# Patient Record
Sex: Female | Born: 1985 | Race: Black or African American | Hispanic: No | Marital: Married | Smoking: Former smoker
Health system: Southern US, Community
[De-identification: ages and names within clinical notes are randomized; demographics above are authoritative.]

## PROBLEM LIST (undated history)

## (undated) ENCOUNTER — Inpatient Hospital Stay (HOSPITAL_COMMUNITY): Payer: Self-pay

## (undated) DIAGNOSIS — I10 Essential (primary) hypertension: Principal | ICD-10-CM

## (undated) DIAGNOSIS — N926 Irregular menstruation, unspecified: Secondary | ICD-10-CM

## (undated) DIAGNOSIS — N051 Unspecified nephritic syndrome with focal and segmental glomerular lesions: Secondary | ICD-10-CM

## (undated) DIAGNOSIS — O24419 Gestational diabetes mellitus in pregnancy, unspecified control: Secondary | ICD-10-CM

## (undated) DIAGNOSIS — D649 Anemia, unspecified: Secondary | ICD-10-CM

## (undated) DIAGNOSIS — R809 Proteinuria, unspecified: Secondary | ICD-10-CM

## (undated) DIAGNOSIS — R569 Unspecified convulsions: Secondary | ICD-10-CM

## (undated) DIAGNOSIS — J40 Bronchitis, not specified as acute or chronic: Secondary | ICD-10-CM

## (undated) HISTORY — DX: Proteinuria, unspecified: R80.9

## (undated) HISTORY — DX: Unspecified convulsions: R56.9

## (undated) HISTORY — DX: Unspecified nephritic syndrome with focal and segmental glomerular lesions: N05.1

## (undated) HISTORY — DX: Essential (primary) hypertension: I10

## (undated) HISTORY — PX: NO PAST SURGERIES: SHX2092

## (undated) HISTORY — DX: Irregular menstruation, unspecified: N92.6

---
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
## 2003-11-18 ENCOUNTER — Inpatient Hospital Stay (HOSPITAL_COMMUNITY): Admission: AD | Admit: 2003-11-18 | Discharge: 2003-11-18 | Payer: Self-pay | Admitting: Obstetrics and Gynecology

## 2003-11-23 ENCOUNTER — Inpatient Hospital Stay (HOSPITAL_COMMUNITY): Admission: AD | Admit: 2003-11-23 | Discharge: 2003-11-23 | Payer: Self-pay | Admitting: Obstetrics and Gynecology

## 2005-01-08 ENCOUNTER — Emergency Department (HOSPITAL_COMMUNITY): Admission: EM | Admit: 2005-01-08 | Discharge: 2005-01-08 | Payer: Self-pay | Admitting: Family Medicine

## 2006-02-28 ENCOUNTER — Emergency Department (HOSPITAL_COMMUNITY): Admission: EM | Admit: 2006-02-28 | Discharge: 2006-02-28 | Payer: Self-pay | Admitting: Emergency Medicine

## 2006-03-01 ENCOUNTER — Emergency Department (HOSPITAL_COMMUNITY): Admission: EM | Admit: 2006-03-01 | Discharge: 2006-03-01 | Payer: Self-pay | Admitting: Emergency Medicine

## 2006-05-23 ENCOUNTER — Emergency Department (HOSPITAL_COMMUNITY): Admission: EM | Admit: 2006-05-23 | Discharge: 2006-05-23 | Payer: Self-pay | Admitting: Emergency Medicine

## 2006-11-05 ENCOUNTER — Emergency Department (HOSPITAL_COMMUNITY): Admission: EM | Admit: 2006-11-05 | Discharge: 2006-11-05 | Payer: Self-pay | Admitting: Emergency Medicine

## 2007-07-09 ENCOUNTER — Emergency Department (HOSPITAL_COMMUNITY): Admission: EM | Admit: 2007-07-09 | Discharge: 2007-07-09 | Payer: Self-pay | Admitting: Emergency Medicine

## 2007-07-12 ENCOUNTER — Emergency Department (HOSPITAL_COMMUNITY): Admission: EM | Admit: 2007-07-12 | Discharge: 2007-07-12 | Payer: Self-pay | Admitting: Emergency Medicine

## 2008-01-07 ENCOUNTER — Emergency Department (HOSPITAL_COMMUNITY): Admission: EM | Admit: 2008-01-07 | Discharge: 2008-01-07 | Payer: Self-pay | Admitting: Emergency Medicine

## 2008-09-21 ENCOUNTER — Emergency Department (HOSPITAL_COMMUNITY): Admission: EM | Admit: 2008-09-21 | Discharge: 2008-09-21 | Payer: Self-pay | Admitting: Emergency Medicine

## 2008-12-18 ENCOUNTER — Emergency Department (HOSPITAL_COMMUNITY): Admission: EM | Admit: 2008-12-18 | Discharge: 2008-12-18 | Payer: Self-pay | Admitting: Emergency Medicine

## 2009-05-16 ENCOUNTER — Emergency Department (HOSPITAL_COMMUNITY): Admission: EM | Admit: 2009-05-16 | Discharge: 2009-05-16 | Payer: Self-pay | Admitting: Emergency Medicine

## 2009-09-21 ENCOUNTER — Emergency Department (HOSPITAL_COMMUNITY): Admission: EM | Admit: 2009-09-21 | Discharge: 2009-09-21 | Payer: Self-pay | Admitting: Emergency Medicine

## 2010-01-21 ENCOUNTER — Emergency Department (HOSPITAL_COMMUNITY)
Admission: EM | Admit: 2010-01-21 | Discharge: 2010-01-21 | Payer: Self-pay | Source: Home / Self Care | Admitting: Family Medicine

## 2010-03-29 LAB — URINALYSIS, ROUTINE W REFLEX MICROSCOPIC
Bilirubin Urine: NEGATIVE
Ketones, ur: NEGATIVE mg/dL
Nitrite: NEGATIVE
Protein, ur: 100 mg/dL — AB
Urobilinogen, UA: 0.2 mg/dL (ref 0.0–1.0)

## 2010-03-29 LAB — POCT PREGNANCY, URINE: Preg Test, Ur: NEGATIVE

## 2010-03-29 LAB — URINE MICROSCOPIC-ADD ON

## 2010-04-12 LAB — CBC
HCT: 40.2 % (ref 36.0–46.0)
Hemoglobin: 13.7 g/dL (ref 12.0–15.0)
Platelets: 313 10*3/uL (ref 150–400)
RBC: 4.59 MIL/uL (ref 3.87–5.11)
WBC: 13.3 10*3/uL — ABNORMAL HIGH (ref 4.0–10.5)

## 2010-04-12 LAB — DIFFERENTIAL
Eosinophils Relative: 2 % (ref 0–5)
Lymphocytes Relative: 13 % (ref 12–46)
Lymphs Abs: 1.7 10*3/uL (ref 0.7–4.0)

## 2010-04-15 LAB — URINE MICROSCOPIC-ADD ON

## 2010-04-15 LAB — URINALYSIS, ROUTINE W REFLEX MICROSCOPIC
Glucose, UA: NEGATIVE mg/dL
Ketones, ur: NEGATIVE mg/dL
pH: 5 (ref 5.0–8.0)

## 2010-05-13 ENCOUNTER — Inpatient Hospital Stay (INDEPENDENT_AMBULATORY_CARE_PROVIDER_SITE_OTHER)
Admission: RE | Admit: 2010-05-13 | Discharge: 2010-05-13 | Disposition: A | Discharge status: Home / Self Care | Payer: Self-pay | Source: Ambulatory Visit | Attending: Family Medicine | Admitting: Family Medicine

## 2010-05-13 DIAGNOSIS — R112 Nausea with vomiting, unspecified: Secondary | ICD-10-CM

## 2010-05-13 DIAGNOSIS — R197 Diarrhea, unspecified: Secondary | ICD-10-CM

## 2010-05-13 LAB — POCT URINALYSIS DIP (DEVICE)
Bilirubin Urine: NEGATIVE
Nitrite: NEGATIVE
Protein, ur: 100 mg/dL — AB
Urobilinogen, UA: 0.2 mg/dL (ref 0.0–1.0)
pH: 7 (ref 5.0–8.0)

## 2010-10-14 LAB — POCT I-STAT, CHEM 8
BUN: 10 mg/dL (ref 6–23)
Calcium, Ion: 0.98 mmol/L — ABNORMAL LOW (ref 1.12–1.32)
Chloride: 105 meq/L (ref 96–112)
Creatinine, Ser: 1.2 mg/dL (ref 0.4–1.2)
Glucose, Bld: 106 mg/dL — ABNORMAL HIGH (ref 70–99)
HCT: 39 % (ref 36.0–46.0)
Hemoglobin: 13.3 g/dL (ref 12.0–15.0)
Potassium: 3.5 meq/L (ref 3.5–5.1)
Sodium: 137 meq/L (ref 135–145)
TCO2: 23 mmol/L (ref 0–100)

## 2010-10-14 LAB — URINALYSIS, ROUTINE W REFLEX MICROSCOPIC
Bilirubin Urine: NEGATIVE
Glucose, UA: NEGATIVE mg/dL
Hgb urine dipstick: NEGATIVE
Ketones, ur: 15 mg/dL — AB
Leukocytes, UA: NEGATIVE
Nitrite: NEGATIVE
Protein, ur: >300 mg/dL — AB
Specific Gravity, Urine: 1.034 — ABNORMAL HIGH (ref 1.005–1.030)
Urobilinogen, UA: 1 mg/dL (ref 0.0–1.0)
pH: 6 (ref 5.0–8.0)

## 2010-10-14 LAB — CBC
HCT: 38.8 % (ref 36.0–46.0)
Hemoglobin: 13.1 g/dL (ref 12.0–15.0)
MCHC: 33.7 g/dL (ref 30.0–36.0)
MCV: 90.8 fL (ref 78.0–100.0)
Platelets: 327 K/uL (ref 150–400)
RBC: 4.27 MIL/uL (ref 3.87–5.11)
RDW: 13.3 % (ref 11.5–15.5)
WBC: 9.9 K/uL (ref 4.0–10.5)

## 2010-10-14 LAB — URINE MICROSCOPIC-ADD ON

## 2010-10-14 LAB — POCT PREGNANCY, URINE: Preg Test, Ur: NEGATIVE

## 2010-10-19 LAB — URINALYSIS, ROUTINE W REFLEX MICROSCOPIC
Leukocytes, UA: NEGATIVE
Nitrite: NEGATIVE
Specific Gravity, Urine: 1.027
pH: 5.5

## 2010-10-19 LAB — URINE MICROSCOPIC-ADD ON

## 2010-10-19 LAB — PREGNANCY, URINE: Preg Test, Ur: NEGATIVE

## 2010-11-03 ENCOUNTER — Emergency Department (HOSPITAL_COMMUNITY)
Admission: EM | Admit: 2010-11-03 | Discharge: 2010-11-03 | Disposition: A | Discharge status: Home / Self Care | Payer: Self-pay | Attending: Emergency Medicine | Admitting: Emergency Medicine

## 2010-11-03 DIAGNOSIS — R110 Nausea: Secondary | ICD-10-CM | POA: Insufficient documentation

## 2010-11-03 DIAGNOSIS — H53149 Visual discomfort, unspecified: Secondary | ICD-10-CM | POA: Insufficient documentation

## 2010-11-03 DIAGNOSIS — R51 Headache: Secondary | ICD-10-CM | POA: Insufficient documentation

## 2010-11-03 LAB — POCT PREGNANCY, URINE: Preg Test, Ur: NEGATIVE

## 2010-12-15 IMAGING — CR DG CHEST 2V
2 series · 2 of 2 positions shown · non-contrast
Comparison: 07/09/2007

CLINICAL DATA: Chest pain.

CHEST - 2 VIEW

[w chest pa]
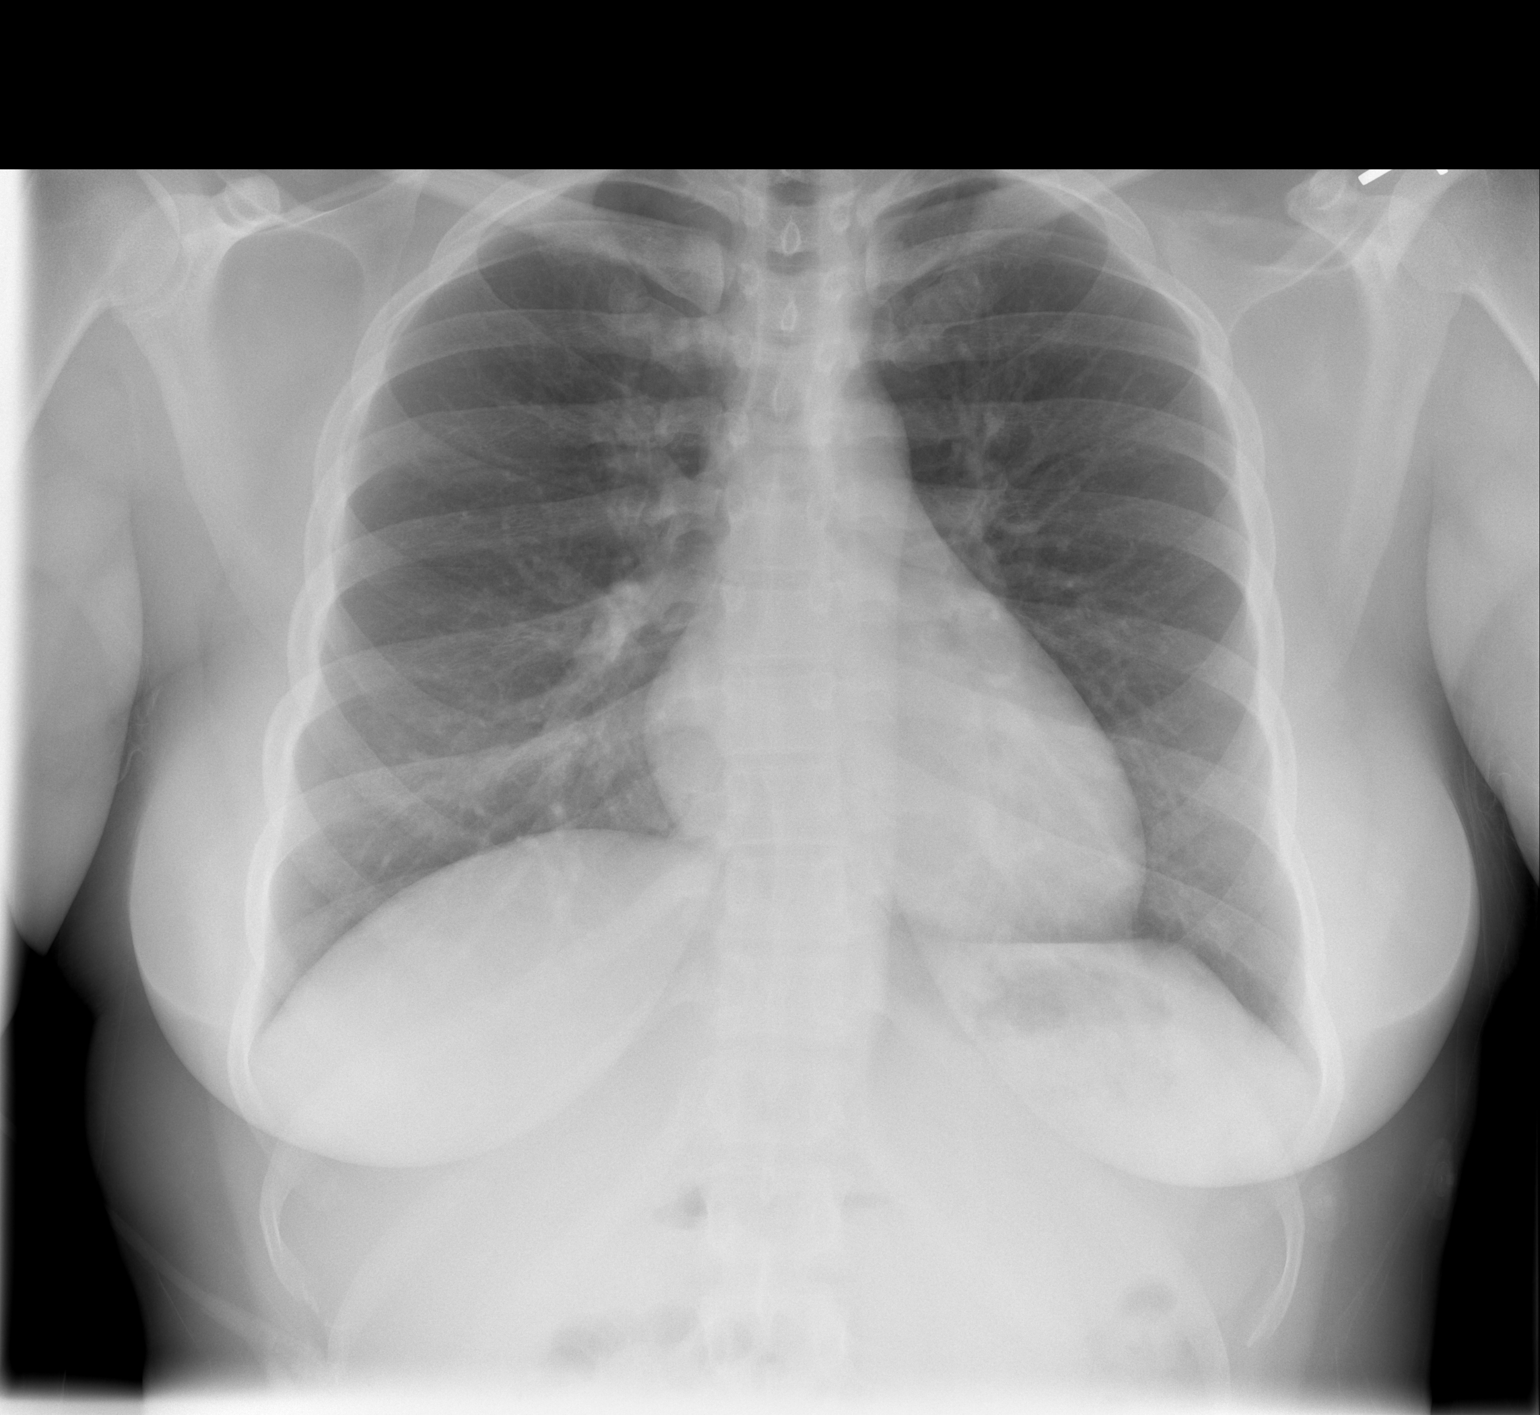

[w chest lat]
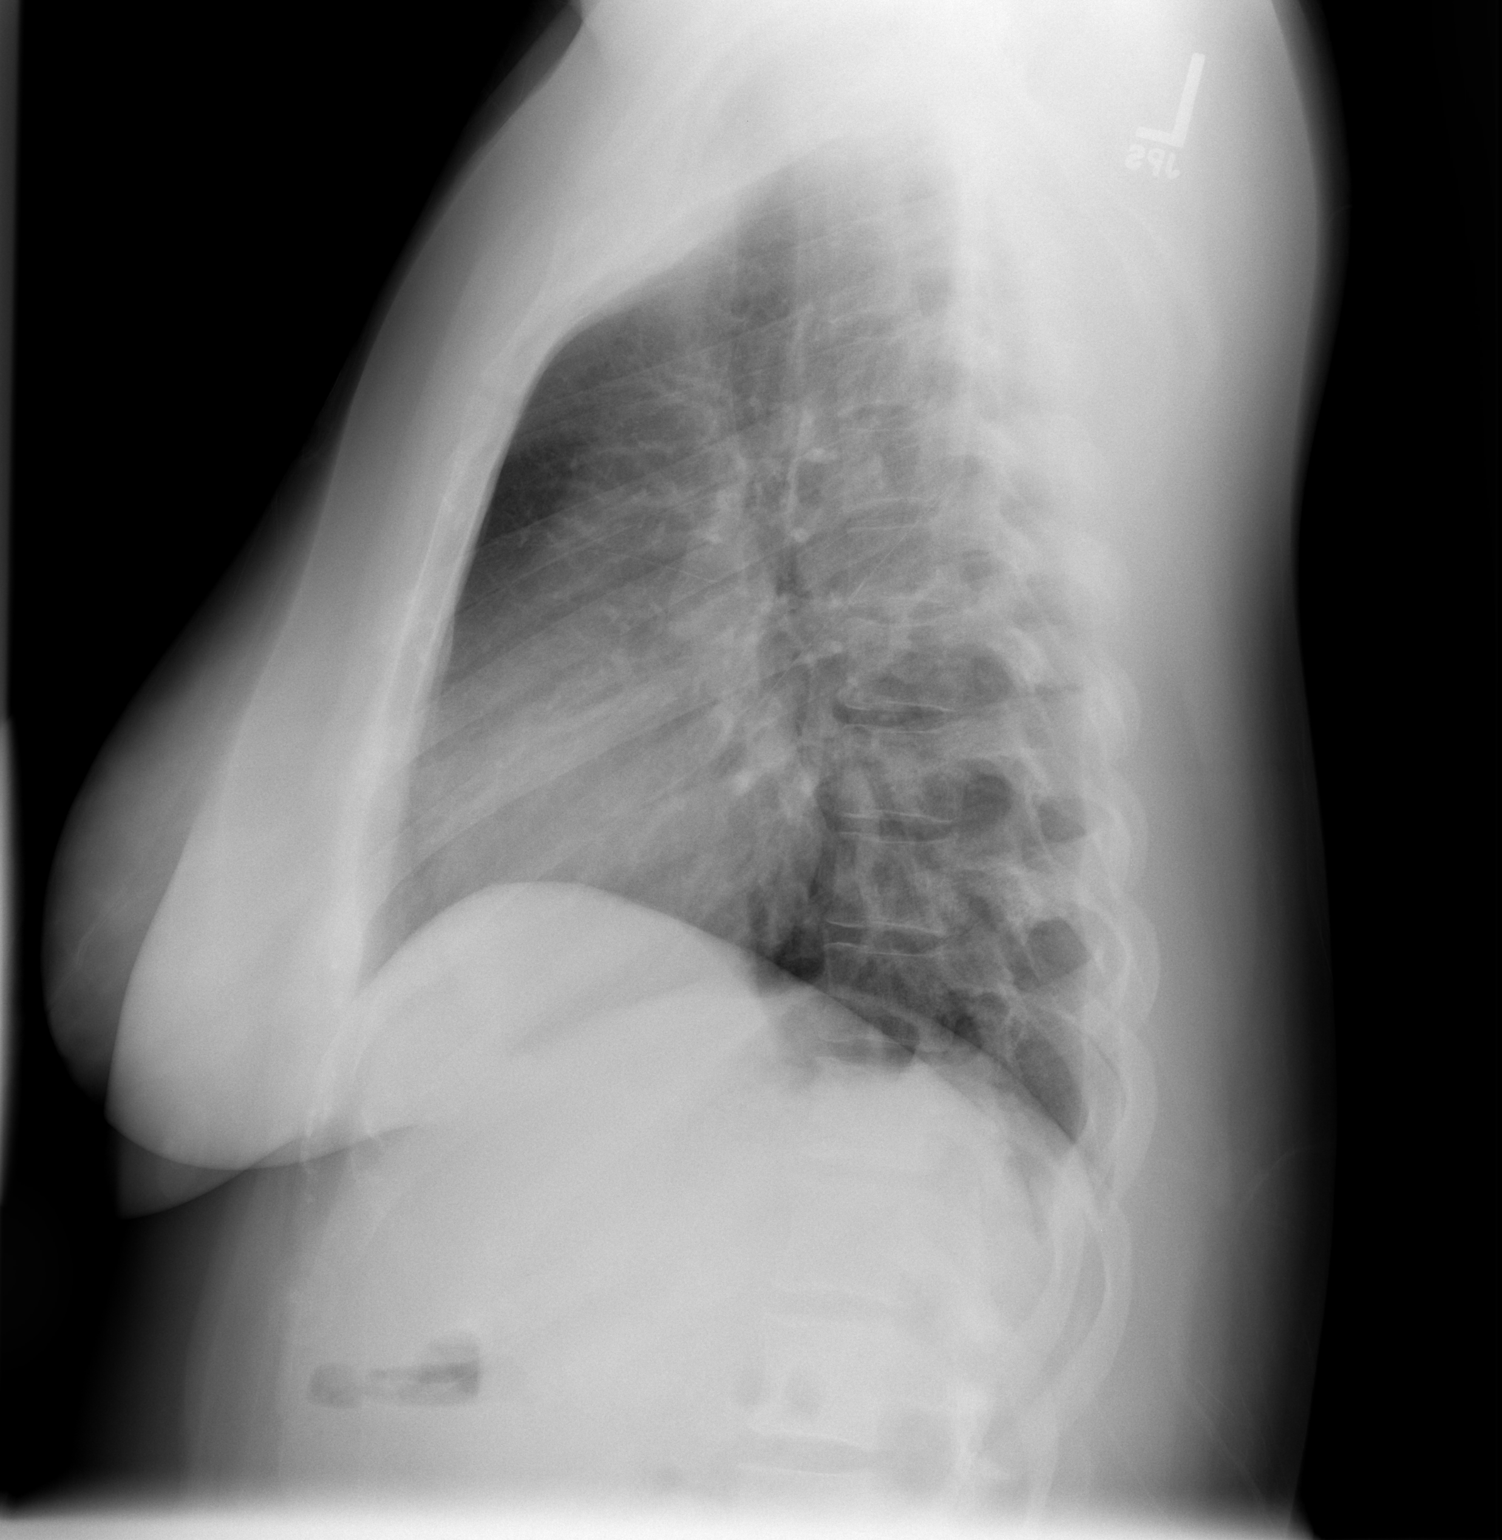

[2 of 2 positions shown; findings below may reference images not displayed]

FINDINGS: The cardiomediastinal silhouette is unremarkable.
Mild peribronchial thickening is unchanged.
The lungs are otherwise clear.
There is no evidence of focal airspace disease, pleural effusions,
or pneumothorax.
No acute bony abnormalities identified.
IMPRESSION: No evidence of acute cardiopulmonary disease.

Chronic mild peribronchial thickening.

## 2010-12-29 ENCOUNTER — Encounter: Payer: Self-pay | Admitting: *Deleted

## 2010-12-29 ENCOUNTER — Emergency Department (HOSPITAL_COMMUNITY): Payer: Self-pay

## 2010-12-29 ENCOUNTER — Emergency Department (HOSPITAL_COMMUNITY)
Admission: EM | Admit: 2010-12-29 | Discharge: 2010-12-29 | Disposition: A | Discharge status: Home / Self Care | Payer: Self-pay | Attending: Emergency Medicine | Admitting: Emergency Medicine

## 2010-12-29 DIAGNOSIS — R05 Cough: Secondary | ICD-10-CM | POA: Insufficient documentation

## 2010-12-29 DIAGNOSIS — R059 Cough, unspecified: Secondary | ICD-10-CM | POA: Insufficient documentation

## 2010-12-29 DIAGNOSIS — R0602 Shortness of breath: Secondary | ICD-10-CM | POA: Insufficient documentation

## 2010-12-29 DIAGNOSIS — R062 Wheezing: Secondary | ICD-10-CM | POA: Insufficient documentation

## 2010-12-29 DIAGNOSIS — J3489 Other specified disorders of nose and nasal sinuses: Secondary | ICD-10-CM | POA: Insufficient documentation

## 2010-12-29 DIAGNOSIS — J40 Bronchitis, not specified as acute or chronic: Secondary | ICD-10-CM | POA: Insufficient documentation

## 2010-12-29 HISTORY — DX: Bronchitis, not specified as acute or chronic: J40

## 2010-12-29 MED ORDER — PREDNISONE 20 MG PO TABS: 40.0000 mg | ORAL_TABLET | Freq: Every day | ORAL | Status: DC

## 2010-12-29 MED ORDER — HYDROCODONE-HOMATROPINE 5-1.5 MG/5ML PO SYRP: 5.0000 mL | ORAL_SOLUTION | Freq: Four times a day (QID) | ORAL | Status: AC | PRN

## 2010-12-29 NOTE — ED Provider Notes (Signed)
Medical screening examination/treatment/procedure(s) were performed by non-physician practitioner and as supervising physician I was immediately available for consultation/collaboration.    Mazy Culton L Afsana Liera, MD 12/29/10 2219 

## 2010-12-29 NOTE — ED Provider Notes (Signed)
History     CSN: [id number]  Arrival date & time 12/29/10  1213   First MD Initiated Contact with Patient 12/29/10 1424      Chief Complaint  Patient presents with  . Cough    pt c/o chest congestion, and cough that began on sunday.    (Consider location/radiation/quality/duration/timing/severity/associated sxs/prior treatment) Patient is a 25 y.o. female presenting with cough. The history is provided by the patient.  Cough This is a new problem. The current episode started more than 2 days ago. The problem occurs constantly. The problem has been gradually worsening. The cough is productive of sputum. There has been no fever. Associated symptoms include rhinorrhea, sore throat, shortness of breath and wheezing. Pertinent negatives include no chest pain, no chills, no sweats, no ear congestion and no ear pain. She has tried cough syrup and decongestants for the symptoms. She is a smoker.  Pt states it started with cold symptoms 3 days ago. States all of them improved except for cough which is worsening. States she is now wheezing, green productive sputum, short of breath.   Past Medical History  Diagnosis Date  . Bronchitis     History reviewed. No pertinent past surgical history.  History reviewed. No pertinent family history.  History  Substance Use Topics  . Smoking status: Never Smoker   . Smokeless tobacco: Not on file  . Alcohol Use: No    OB History    Grav Para Term Preterm Abortions TAB SAB Ect Mult Living                  Review of Systems  Constitutional: Negative for fever and chills.  HENT: Positive for sore throat and rhinorrhea. Negative for ear pain and neck pain.   Eyes: Negative.   Respiratory: Positive for cough, shortness of breath and wheezing.   Cardiovascular: Negative for chest pain.  Gastrointestinal: Negative.   Genitourinary: Negative.   Musculoskeletal: Negative.   Skin: Negative.  Negative for rash.  Neurological: Negative.     Psychiatric/Behavioral: Negative.     Allergies  Ibuprofen  Home Medications   Current Outpatient Rx  Name Route Sig Dispense Refill  . DM-GUAIFENESIN ER 30-600 MG PO TB12 Oral Take 1 tablet by mouth every 12 (twelve) hours.      [name] PSEUDOEPHEDRINE-ACETAMINOPHEN 30-500 MG PO TABS Oral Take 1 tablet by mouth every 4 (four) hours as needed. HEAD COLD       BP 137/95  Pulse 80  Temp(Src) 98.7 F (37.1 C) (Oral)  Resp 18  SpO2 97%  LMP 06/20/2010  Physical Exam  Nursing note and vitals reviewed. Constitutional: She is oriented to person, place, and time. She appears well-developed and well-nourished.  HENT:  Head: Normocephalic.  Right Ear: External ear normal.  Left Ear: External ear normal.  Mouth/Throat: Oropharynx is clear and moist.       Clear nasal rhinorrhea  Eyes: Conjunctivae are normal. Pupils are equal, round, and reactive to light.  Neck: Neck supple.  Cardiovascular: Normal rate, regular rhythm and normal heart sounds.   Pulmonary/Chest: Effort normal and breath sounds normal. No respiratory distress. She has no wheezes. She has no rales.  Musculoskeletal: Normal range of motion.  Neurological: She is alert and oriented to person, place, and time.  Skin: Skin is warm and dry.  Psychiatric: She has a normal mood and affect.    ED Course  Procedures (including critical care time)  Dg Chest 2 View  12/29/2010  *RADIOLOGY REPORT*  Clinical Data: Shortness of breath, cough, chest pain, smoking history  CHEST - 2 VIEW  Comparison: The chest x-ray of 12/18/2008  Findings: The lungs are clear.  Again there are somewhat prominent perihilar markings present.  Mediastinal contours appear stable. The heart is within normal limits in size.  No bony abnormality is seen.  IMPRESSION: No active lung disease.  Original Report Authenticated By: Juline Patch, M.D.   Pt with cough for 3 days. Negative cxr. Pt afebrile, no other complaints. Pt is otherwise healthy. Suspect  viral bronchitis.  Will d/c home with close follow up. Will treat with prednisone, pt already has albuterol, and cough medications.  MDM          Lottie Mussel, PA 12/29/10 [phone]

## 2012-08-25 ENCOUNTER — Emergency Department (HOSPITAL_COMMUNITY)
Admission: EM | Admit: 2012-08-25 | Discharge: 2012-08-25 | Disposition: A | Discharge status: Home / Self Care | Payer: Self-pay | Attending: Emergency Medicine | Admitting: Emergency Medicine

## 2012-08-25 ENCOUNTER — Encounter (HOSPITAL_COMMUNITY): Payer: Self-pay | Admitting: Emergency Medicine

## 2012-08-25 DIAGNOSIS — M62838 Other muscle spasm: Secondary | ICD-10-CM | POA: Insufficient documentation

## 2012-08-25 MED ORDER — METHOCARBAMOL 500 MG PO TABS: 500.0000 mg | ORAL_TABLET | Freq: Two times a day (BID) | ORAL | Status: DC | PRN

## 2012-08-25 MED ORDER — TRAMADOL HCL 50 MG PO TABS: 50.0000 mg | ORAL_TABLET | Freq: Four times a day (QID) | ORAL | Status: DC | PRN

## 2012-08-25 NOTE — ED Provider Notes (Signed)
CSN: [id number]     Arrival date & time 08/25/12  1101 History     First MD Initiated Contact with Patient 08/25/12 1132     Chief Complaint  Patient presents with  . Neck Pain   (Consider location/radiation/quality/duration/timing/severity/associated sxs/prior Treatment) The history is provided by the patient and medical records.   Pt presents to the ED for left sided neck pain radiating into her left shoulder x 1 week.  Pt states she had a migraine a few days ago-- hx of migraines, states it was typical headache.  Migraines are usually associated with neck muscle tension but usually resolve with the headache.  Pt states she can move her neck, but it is very painful and "tight" feeling.  Denies any numbness or paresthesias of UE. Denies any heavy lifting or new exercise to cause muscle strain. No headaches at present.  No fevers, sweats, or chills.  No visual disturbance, tinnitus, difficulty concentrating, or AMS.  Has taken Tylenol PTA without significant improvement.  Past Medical History  Diagnosis Date  . Bronchitis    History reviewed. No pertinent past surgical history. History reviewed. No pertinent family history. History  Substance Use Topics  . Smoking status: Never Smoker   . Smokeless tobacco: Not on file  . Alcohol Use: No   OB History   Grav Para Term Preterm Abortions TAB SAB Ect Mult Living                 Review of Systems  HENT: Positive for neck pain.   Musculoskeletal: Positive for myalgias.  All other systems reviewed and are negative.    Allergies  Ibuprofen  Home Medications   Current Outpatient Rx  Name  Route  Sig  Dispense  Refill  . acetaminophen (TYLENOL) 500 MG tablet   Oral   Take 1,000 mg by mouth every 6 (six) hours as needed for pain.          BP 193/123  Pulse 82  Temp(Src) 98.5 F (36.9 C) (Oral)  Resp 18  Ht 5\' 5"  (1.651 m)  Wt 190 lb (86.183 kg)  BMI 31.62 kg/m2  SpO2 99%  LMP 02/26/2012  Physical Exam  Nursing  note and vitals reviewed. Constitutional: She is oriented to person, place, and time. She appears well-developed and well-nourished.  HENT:  Head: Normocephalic and atraumatic.  Eyes: Conjunctivae and EOM are normal.  Neck: Normal range of motion and full passive range of motion without pain. Neck supple. Muscular tenderness present. No spinous process tenderness present. No rigidity. No erythema and normal range of motion present.    TTP of left trapezius, no nuchal rigidity, full ROM maintained including chin-to-chest, strong radial pulse and cap refill, sensation intact  Cardiovascular: Normal rate, regular rhythm and normal heart sounds.   Pulmonary/Chest: Effort normal and breath sounds normal.  Musculoskeletal: Normal range of motion. She exhibits no edema.  Neurological: She is alert and oriented to person, place, and time. She has normal strength. She displays no tremor. No sensory deficit. She displays no seizure activity. Gait normal.  Skin: Skin is warm and dry.  Psychiatric: She has a normal mood and affect.    ED Course   Procedures (including critical care time)  Labs Reviewed - No data to display No results found. 1. Neck muscle spasm     MDM   Muscles spasms tracking along left side of trapezius.  No nuchal rigidity, fevers, or current headache to suggest meningitis.  Rx robaxin and tramadol.  Discussed supportive care home, including heat therapy. Pt hypertensive in the ED without hx of HTN-- encouraged close followup with the [facility] for re-check of BP and close FU if symptoms not improving in the next few days.    Discussed plan with patient, she agreed. Return precautions advised.  Garlon Hatchet, PA-C 08/25/12 1242

## 2012-08-25 NOTE — ED Notes (Signed)
Pt alert, arrives from home, c/o left shoulder and neck pain, onset was a week ago, denies trauma or injury, resp even unlabored, skin pwd, pt states pain is worse with movement

## 2012-08-27 NOTE — ED Provider Notes (Signed)
Medical screening examination/treatment/procedure(s) were performed by non-physician practitioner and as supervising physician I was immediately available for consultation/collaboration.   Makinzey Banes W. Allegra Cerniglia, MD 08/27/12 0853 

## 2012-08-28 ENCOUNTER — Emergency Department (HOSPITAL_COMMUNITY)
Admission: EM | Admit: 2012-08-28 | Discharge: 2012-08-28 | Disposition: A | Discharge status: Home / Self Care | Payer: Self-pay | Attending: Emergency Medicine | Admitting: Emergency Medicine

## 2012-08-28 ENCOUNTER — Encounter (HOSPITAL_COMMUNITY): Payer: Self-pay | Admitting: Emergency Medicine

## 2012-08-28 DIAGNOSIS — I10 Essential (primary) hypertension: Secondary | ICD-10-CM | POA: Insufficient documentation

## 2012-08-28 DIAGNOSIS — G243 Spasmodic torticollis: Secondary | ICD-10-CM | POA: Insufficient documentation

## 2012-08-28 DIAGNOSIS — Z8709 Personal history of other diseases of the respiratory system: Secondary | ICD-10-CM | POA: Insufficient documentation

## 2012-08-28 DIAGNOSIS — F17200 Nicotine dependence, unspecified, uncomplicated: Secondary | ICD-10-CM | POA: Insufficient documentation

## 2012-08-28 MED ORDER — HYDROMORPHONE HCL PF 1 MG/ML IJ SOLN
1.0000 mg | Freq: Once | INTRAMUSCULAR | Status: AC
  Administered 2012-08-28: 1 mg via INTRAVENOUS
  Filled 2012-08-28: qty 1

## 2012-08-28 MED ORDER — HYDROMORPHONE HCL PF 1 MG/ML IJ SOLN
0.5000 mg | Freq: Once | INTRAMUSCULAR | Status: AC
  Administered 2012-08-28: 0.5 mg via INTRAVENOUS
  Filled 2012-08-28: qty 1

## 2012-08-28 MED ORDER — DIAZEPAM 5 MG/ML IJ SOLN
10.0000 mg | Freq: Once | INTRAMUSCULAR | Status: AC
  Administered 2012-08-28: 10 mg via INTRAVENOUS
  Filled 2012-08-28: qty 2

## 2012-08-28 MED ORDER — OXYCODONE-ACETAMINOPHEN 5-325 MG PO TABS: 1.0000 | ORAL_TABLET | ORAL | Status: DC | PRN

## 2012-08-28 MED ORDER — DIAZEPAM 5 MG/ML IJ SOLN
5.0000 mg | Freq: Once | INTRAMUSCULAR | Status: AC
  Administered 2012-08-28: 5 mg via INTRAVENOUS
  Filled 2012-08-28: qty 2

## 2012-08-28 MED ORDER — DIAZEPAM 5 MG PO TABS: 5.0000 mg | ORAL_TABLET | Freq: Four times a day (QID) | ORAL | Status: DC | PRN

## 2012-08-28 NOTE — ED Notes (Signed)
Patient will not hold still for blood pressure check.RN Victorino Dike made aware

## 2012-08-28 NOTE — ED Provider Notes (Signed)
CSN: [id number]     Arrival date & time 08/28/12  1346 History     First MD Initiated Contact with Patient 08/28/12 1537     Chief Complaint  Patient presents with  . Neck Pain   (Consider location/radiation/quality/duration/timing/severity/associated sxs/prior Treatment) Patient is a 27 y.o. female presenting with neck pain. The history is provided by the patient and medical records.  Neck Pain Pain location:  L side Quality:  Cramping and stiffness Stiffness is present:  All day Pain severity:  Severe Pain is:  Same all the time Onset quality:  Gradual Duration:  1 week Timing:  Constant Progression:  Worsening Chronicity:  New Context: not fall, not jumping from heights, not lifting a heavy object, not MCA, not MVA, not pedestrian accident and not recent injury   Relieved by:  Nothing Worsened by:  Position and twisting Ineffective treatments:  Analgesics, muscle relaxants, heat and ice Associated symptoms: no bladder incontinence, no bowel incontinence, no chest pain, no fever, no leg pain, no numbness, no paresis, no photophobia, no syncope, no tingling, no visual change and no weakness     Selena Meyer is a 27 y.o. female  with no medical Hx  presents to the Emergency Department complaining of gradual, persistent, progressively worsening left sided neck pain beginning approx 10 days ago.  Pt evaluated here in the department on 08/25/12 and dx with neck spasms.  Pt states on that visit there was only a twinge of a spasm.  She reports that this has worsened significantly since that time in spite of her use of robaxin and tramadol.  Pt also reports heat helps some. Associated symptoms include swelling of the trapezius at the insertion point of the cervical spine and tenderness to palpation of the L side of the neck .  Nothing makes it better and movement and palpation makes it worse.  Pt denies fever, chills, chest pain, SOB, abd pain, N/V/D, weakness, numbness, tingling, syncope,  dysuria, hematuria.  Pt specifically denies trauma including MVA, falls or assault.       Past Medical History  Diagnosis Date  . Bronchitis    History reviewed. No pertinent past surgical history. History reviewed. No pertinent family history. History  Substance Use Topics  . Smoking status: Current Every Day Smoker -- 0.50 packs/day for 5 years  . Smokeless tobacco: Not on file  . Alcohol Use: No   OB History   Grav Para Term Preterm Abortions TAB SAB Ect Mult Living                 Review of Systems  Constitutional: Negative for fever.  HENT: Positive for neck pain.   Eyes: Negative for photophobia.  Cardiovascular: Negative for chest pain and syncope.  Gastrointestinal: Negative for bowel incontinence.  Genitourinary: Negative for bladder incontinence.  Neurological: Negative for tingling, weakness and numbness.    Allergies  Ibuprofen  Home Medications   Current Outpatient Rx  Name  Route  Sig  Dispense  Refill  . acetaminophen (TYLENOL) 500 MG tablet   Oral   Take 1,000 mg by mouth every 6 (six) hours as needed for pain.         . methocarbamol (ROBAXIN) 500 MG tablet   Oral   Take 500 mg by mouth 2 (two) times daily as needed (for muscle spasm).         . traMADol (ULTRAM) 50 MG tablet   Oral   Take 1 tablet (50 mg total) by mouth every  6 (six) hours as needed for pain.   15 tablet   0   . diazepam (VALIUM) 5 MG tablet   Oral   Take 1 tablet (5 mg total) by mouth every 6 (six) hours as needed for anxiety (spasms).   21 tablet   0   . oxyCODONE-acetaminophen (PERCOCET/ROXICET) 5-325 MG per tablet   Oral   Take 1-2 tablets by mouth every 4 (four) hours as needed for pain.   21 tablet   0    BP 156/113  Pulse 103  Temp(Src) 98.2 F (36.8 C) (Oral)  Resp 18  SpO2 98%  LMP 02/26/2012 Physical Exam  Nursing note and vitals reviewed. Constitutional: She is oriented to person, place, and time. She appears well-developed and well-nourished.  No distress.  Awake, alert, nontoxic appearance  HENT:  Head: Normocephalic and atraumatic.  Mouth/Throat: Oropharynx is clear and moist. No oropharyngeal exudate.  Eyes: Conjunctivae and EOM are normal. Pupils are equal, round, and reactive to light. No scleral icterus.  Neck: Phonation normal. Neck supple. Muscular tenderness present. No spinous process tenderness present. No rigidity. Decreased range of motion present.    Pt with visible and palpable spasm of the L paraspinal muscles including trapezius at its insertion point and sternocleidomastoid Pt with horizontal pull of the head toward the L shoulder with pointing of the chin up and to the right Pt with significantly decreased ROM 2/2 pain Pt tearful when encouraged to straighten her head  Cardiovascular: Normal rate, regular rhythm, normal heart sounds and intact distal pulses.   No murmur heard. Pulmonary/Chest: Effort normal and breath sounds normal. No stridor. No respiratory distress. She has no wheezes.  Abdominal: Soft. Bowel sounds are normal. She exhibits no distension. There is no tenderness. There is no rebound.  Musculoskeletal: She exhibits no edema.  Lymphadenopathy:    She has no cervical adenopathy.  Neurological: She is alert and oriented to person, place, and time. No cranial nerve deficit. She exhibits normal muscle tone. Coordination normal.  Speech is clear and goal oriented Moves extremities without ataxia No cranial nerve deficit  Skin: Skin is warm and dry. She is not diaphoretic. No erythema.  Psychiatric: She has a normal mood and affect.    ED Course   Procedures (including critical care time)  Labs Reviewed - No data to display No results found. 1. Torticollis, spasmodic   2. HTN (hypertension)     MDM  Selena Meyer presents with physical exam consistent with torticollis.  Will give pain control and muscle relaxer.  No trauma to indicate structural deformity or disc herniation.  Will give  muscle relaxation and re-evaluate.  Pt noted to be hypertensive on arrival.    7:25 PM Pt with adequate pain relief and only mildly decreased ROM after muscle relaxer.  Pt remains hypertensive but asymptomatic without CP, SOB, abd pain, etc.  Pt has been instructed to f/u with PCP for evaluation of this.  I have stressed the importance of blood pressure control and her concerning BP here in the ED.  I have also discussed reasons to return immediately to the ER.  Patient expresses understanding and agrees with plan.   BP 156/113  Pulse 103  Temp(Src) 98.2 F (36.8 C) (Oral)  Resp 18  SpO2 98%  LMP 02/26/2012  Dr. Baxter Hire Ward was consulted and agrees with the plan.      Selena Client Brysan Mcevoy, PA-C 08/29/12 [phone]

## 2012-08-28 NOTE — ED Notes (Signed)
Pt c/o neck pain that she has been seen for before.  Pt told it was muscle spasms.  Pt wearing pajamas and carrying a big teddy bear.  Pt not willing to answer many questions in detail.

## 2012-08-28 NOTE — ED Notes (Signed)
Bedside report received from previous RN, Kim 

## 2012-08-29 NOTE — ED Provider Notes (Signed)
Medical screening examination/treatment/procedure(s) were performed by non-physician practitioner and as supervising physician I was immediately available for consultation/collaboration.  Vollie Brunty N Cassidey Barrales, DO 08/29/12 1448 

## 2012-09-11 ENCOUNTER — Ambulatory Visit: Payer: Self-pay

## 2013-02-26 ENCOUNTER — Emergency Department (HOSPITAL_COMMUNITY): Payer: Medicaid Other

## 2013-02-26 ENCOUNTER — Emergency Department (HOSPITAL_COMMUNITY)
Admission: EM | Admit: 2013-02-26 | Discharge: 2013-02-26 | Disposition: A | Discharge status: Home / Self Care | Payer: Medicaid Other | Attending: Emergency Medicine | Admitting: Emergency Medicine

## 2013-02-26 ENCOUNTER — Encounter (HOSPITAL_COMMUNITY): Payer: Self-pay | Admitting: Emergency Medicine

## 2013-02-26 DIAGNOSIS — R519 Headache, unspecified: Secondary | ICD-10-CM

## 2013-02-26 DIAGNOSIS — R059 Cough, unspecified: Secondary | ICD-10-CM | POA: Insufficient documentation

## 2013-02-26 DIAGNOSIS — R079 Chest pain, unspecified: Secondary | ICD-10-CM | POA: Insufficient documentation

## 2013-02-26 DIAGNOSIS — Z3202 Encounter for pregnancy test, result negative: Secondary | ICD-10-CM | POA: Insufficient documentation

## 2013-02-26 DIAGNOSIS — R05 Cough: Secondary | ICD-10-CM | POA: Insufficient documentation

## 2013-02-26 DIAGNOSIS — F17200 Nicotine dependence, unspecified, uncomplicated: Secondary | ICD-10-CM | POA: Insufficient documentation

## 2013-02-26 DIAGNOSIS — J029 Acute pharyngitis, unspecified: Secondary | ICD-10-CM | POA: Insufficient documentation

## 2013-02-26 DIAGNOSIS — I160 Hypertensive urgency: Secondary | ICD-10-CM

## 2013-02-26 DIAGNOSIS — R030 Elevated blood-pressure reading, without diagnosis of hypertension: Secondary | ICD-10-CM | POA: Insufficient documentation

## 2013-02-26 DIAGNOSIS — R51 Headache: Secondary | ICD-10-CM | POA: Insufficient documentation

## 2013-02-26 LAB — URINALYSIS, ROUTINE W REFLEX MICROSCOPIC
Bilirubin Urine: NEGATIVE
Glucose, UA: NEGATIVE mg/dL
Hgb urine dipstick: NEGATIVE
KETONES UR: NEGATIVE mg/dL
LEUKOCYTES UA: NEGATIVE
NITRITE: NEGATIVE
Specific Gravity, Urine: 1.023 (ref 1.005–1.030)
Urobilinogen, UA: 0.2 mg/dL (ref 0.0–1.0)
pH: 6 (ref 5.0–8.0)

## 2013-02-26 LAB — BASIC METABOLIC PANEL
BUN: 9 mg/dL (ref 6–23)
CALCIUM: 9.4 mg/dL (ref 8.4–10.5)
CO2: 25 mEq/L (ref 19–32)
Chloride: 100 mEq/L (ref 96–112)
Creatinine, Ser: 1.21 mg/dL — ABNORMAL HIGH (ref 0.50–1.10)
GFR, EST AFRICAN AMERICAN: 69 mL/min — AB (ref >90)
GFR, EST NON AFRICAN AMERICAN: 60 mL/min — AB (ref >90)
GLUCOSE: 98 mg/dL (ref 70–99)
POTASSIUM: 4.1 meq/L (ref 3.7–5.3)
SODIUM: 137 meq/L (ref 137–147)

## 2013-02-26 LAB — CBC WITH DIFFERENTIAL/PLATELET
BASOS PCT: 0 % (ref 0–1)
Basophils Absolute: 0 10*3/uL (ref 0.0–0.1)
EOS ABS: 0.5 10*3/uL (ref 0.0–0.7)
EOS PCT: 4 % (ref 0–5)
HCT: 43.5 % (ref 36.0–46.0)
Hemoglobin: 15.2 g/dL — ABNORMAL HIGH (ref 12.0–15.0)
LYMPHS ABS: 2.8 10*3/uL (ref 0.7–4.0)
Lymphocytes Relative: 26 % (ref 12–46)
MCH: 30.3 pg (ref 26.0–34.0)
MCHC: 34.9 g/dL (ref 30.0–36.0)
MCV: 86.7 fL (ref 78.0–100.0)
MONOS PCT: 7 % (ref 3–12)
Monocytes Absolute: 0.7 10*3/uL (ref 0.1–1.0)
NEUTROS PCT: 62 % (ref 43–77)
Neutro Abs: 6.6 10*3/uL (ref 1.7–7.7)
PLATELETS: 297 10*3/uL (ref 150–400)
RBC: 5.02 MIL/uL (ref 3.87–5.11)
RDW: 12.9 % (ref 11.5–15.5)
WBC: 10.6 10*3/uL — ABNORMAL HIGH (ref 4.0–10.5)

## 2013-02-26 LAB — URINE MICROSCOPIC-ADD ON

## 2013-02-26 LAB — RAPID STREP SCREEN (MED CTR MEBANE ONLY): Streptococcus, Group A Screen (Direct): NEGATIVE

## 2013-02-26 LAB — TROPONIN I

## 2013-02-26 LAB — PREGNANCY, URINE: Preg Test, Ur: NEGATIVE

## 2013-02-26 MED ORDER — ACETAMINOPHEN 325 MG PO TABS
650.0000 mg | ORAL_TABLET | Freq: Once | ORAL | Status: AC
  Administered 2013-02-26: 650 mg via ORAL
  Filled 2013-02-26: qty 2

## 2013-02-26 NOTE — ED Notes (Addendum)
Pt c/o sore throat since Saturday, Pt c/o headache since yesterday.  States the more she coughs the more her head hurts. Pt HTN no hx of HTN.

## 2013-02-26 NOTE — ED Provider Notes (Signed)
CSN: [id number]     Arrival date & time 02/26/13  1232 History   First MD Initiated Contact with Patient 02/26/13 1243     Chief Complaint  Patient presents with  . Sore Throat  . Headache    HPI  Selena Meyer is a 28 y.o. female with a PMH of bronchitis who presents to the ED for evaluation of sore throat and headaches.  History was provided by the patient. Patient states that she developed a headache and sore throat 4 days ago. Her headache is located in the front and back of her head. She describes a constant aching pain. No photophobia or vision changes. Patient has a hx of migraine headaches which is not similar to her headache today. Denies this being the worst headache of her life. States with her migraine headaches she has photophobia. She has had similar headaches in the past with today's presentation. No weakness, loss of sensation, numbness/tingling, slurred speech, ataxia, or confusion. States she tried Ibuprofen and chloraseptic throat spray with no relief. She also describes a productive cough with yellow sputum. No hemoptysis or SOB. She also has mid-sternal chest pain only with coughing. No chest pain at rest. No abdominal pain, nausea, emesis, diarrhea, constipation, dysuria, fever, or change in appetite/activity. Patient's BP elevated on today's visit. No hx of HTN in the past. FH of HTN in mom.    Past Medical History  Diagnosis Date  . Bronchitis    History reviewed. No pertinent past surgical history. No family history on file. History  Substance Use Topics  . Smoking status: Current Every Day Smoker -- 0.50 packs/day for 5 years  . Smokeless tobacco: Not on file  . Alcohol Use: No   OB History   Grav Para Term Preterm Abortions TAB SAB Ect Mult Living                 Review of Systems  Constitutional: Negative for fever, chills, diaphoresis, activity change, appetite change and fatigue.  HENT: Positive for sore throat. Negative for congestion, ear pain,  rhinorrhea and trouble swallowing.   Eyes: Negative for photophobia, pain and visual disturbance.  Respiratory: Positive for cough. Negative for shortness of breath and wheezing.   Cardiovascular: Positive for chest pain. Negative for leg swelling.  Gastrointestinal: Negative for nausea, vomiting, abdominal pain, diarrhea and constipation.  Genitourinary: Negative for dysuria.  Musculoskeletal: Negative for back pain and myalgias.  Skin: Negative for wound.  Neurological: Positive for headaches. Negative for dizziness, syncope, speech difficulty, weakness, light-headedness and numbness.    Allergies  Ibuprofen  Home Medications   Current Outpatient Rx  Name  Route  Sig  Dispense  Refill  . acetaminophen (TYLENOL) 500 MG tablet   Oral   Take 1,000 mg by mouth every 6 (six) hours as needed for pain.          BP 200/149  Pulse 113  Temp(Src) 98.4 F (36.9 C) (Oral)  Resp 20  SpO2 100%  Filed Vitals:   02/26/13 1234 02/26/13 1506 02/26/13 1615  BP: 200/149 178/118 182/102  Pulse: 113 89 84  Temp: 98.4 F (36.9 C)    TempSrc: Oral    Resp: 20 16 20   SpO2: 100% 99% 100%    Physical Exam  Nursing note and vitals reviewed. Constitutional: She is oriented to person, place, and time. She appears well-developed and well-nourished. No distress.  HENT:  Head: Normocephalic and atraumatic.  Right Ear: External ear normal.  Left Ear: External ear  normal.  Nose: Nose normal.  Mouth/Throat: Oropharynx is clear and moist. No oropharyngeal exudate.  No erythema to the posterior pharynx. Tonsils without edema or exudates. Uvula midline. No trismus. No difficulty controlling secretions. Tympanic membranes gray and translucent bilaterally with no erythema, edema, or hemotympanum.   Eyes: Conjunctivae and EOM are normal. Pupils are equal, round, and reactive to light. Right eye exhibits no discharge. Left eye exhibits no discharge.  No papilledema bilaterally  Neck: Normal range of  motion. Neck supple.  No LAD. No rigidity.   Cardiovascular: Normal rate, regular rhythm, normal heart sounds and intact distal pulses.  Exam reveals no gallop and no friction rub.   No murmur heard. Pulmonary/Chest: Effort normal and breath sounds normal. No respiratory distress. She has no wheezes. She has no rales. She exhibits no tenderness.  Abdominal: Soft. She exhibits no distension and no mass. There is no tenderness. There is no rebound and no guarding.  Musculoskeletal: Normal range of motion. She exhibits no edema and no tenderness.  No LE edema or calf tenderness bilaterally. Strength 5/5 in the upper and lower extremities bilaterally. Patient able to ambulate without difficulty or ataxia.  Neurological: She is alert and oriented to person, place, and time.  GCS 15.  No focal neurological deficits.  CN 2-12 intact.  No pronator drift.  Finger to nose intact.    Skin: Skin is warm and dry. She is not diaphoretic.    ED Course  Procedures (including critical care time) Labs Review Labs Reviewed - No data to display Imaging Review No results found.   EKG Interpretation    Date/Time:  Wednesday February 26 2013 13:48:14 EST Ventricular Rate:  94 PR Interval:  179 QRS Duration: 71 QT Interval:  356 QTC Calculation: 445 R Axis:   38 Text Interpretation:  Sinus rhythm Left atrial enlargement Minimal ST depression, inferior leads Sinus rhythm Left atrial enlargement No significant change since last tracing Abnormal ekg Confirmed by Gerhard Munch  MD [phone]) on 02/26/2013 2:09:33 PM            Results for orders placed during the hospital encounter of 02/26/13  RAPID STREP SCREEN      Result Value Ref Range   Streptococcus, Group A Screen (Direct) NEGATIVE  NEGATIVE  CBC WITH DIFFERENTIAL      Result Value Ref Range   WBC 10.6 (*) 4.0 - 10.5 K/uL   RBC 5.02  3.87 - 5.11 MIL/uL   Hemoglobin 15.2 (*) 12.0 - 15.0 g/dL   HCT 11.9  14.7 - 82.9 %   MCV 86.7  78.0 -  100.0 fL   MCH 30.3  26.0 - 34.0 pg   MCHC 34.9  30.0 - 36.0 g/dL   RDW 56.2  13.0 - 86.5 %   Platelets 297  150 - 400 K/uL   Neutrophils Relative % 62  43 - 77 %   Neutro Abs 6.6  1.7 - 7.7 K/uL   Lymphocytes Relative 26  12 - 46 %   Lymphs Abs 2.8  0.7 - 4.0 K/uL   Monocytes Relative 7  3 - 12 %   Monocytes Absolute 0.7  0.1 - 1.0 K/uL   Eosinophils Relative 4  0 - 5 %   Eosinophils Absolute 0.5  0.0 - 0.7 K/uL   Basophils Relative 0  0 - 1 %   Basophils Absolute 0.0  0.0 - 0.1 K/uL  BASIC METABOLIC PANEL      Result Value Ref Range  Sodium 137  137 - 147 mEq/L   Potassium 4.1  3.7 - 5.3 mEq/L   Chloride 100  96 - 112 mEq/L   CO2 25  19 - 32 mEq/L   Glucose, Bld 98  70 - 99 mg/dL   BUN 9  6 - 23 mg/dL   Creatinine, Ser 1.61 (*) 0.50 - 1.10 mg/dL   Calcium 9.4  8.4 - 09.6 mg/dL   GFR calc non Af Amer 60 (*) >90 mL/min   GFR calc Af Amer 69 (*) >90 mL/min  TROPONIN I      Result Value Ref Range   Troponin I <0.30  <0.30 ng/mL  PREGNANCY, URINE      Result Value Ref Range   Preg Test, Ur NEGATIVE  NEGATIVE  URINALYSIS, ROUTINE W REFLEX MICROSCOPIC      Result Value Ref Range   Color, Urine YELLOW  YELLOW   APPearance CLOUDY (*) CLEAR   Specific Gravity, Urine 1.023  1.005 - 1.030   pH 6.0  5.0 - 8.0   Glucose, UA NEGATIVE  NEGATIVE mg/dL   Hgb urine dipstick NEGATIVE  NEGATIVE   Bilirubin Urine NEGATIVE  NEGATIVE   Ketones, ur NEGATIVE  NEGATIVE mg/dL   Protein, ur >045 (*) NEGATIVE mg/dL   Urobilinogen, UA 0.2  0.0 - 1.0 mg/dL   Nitrite NEGATIVE  NEGATIVE   Leukocytes, UA NEGATIVE  NEGATIVE  URINE MICROSCOPIC-ADD ON      Result Value Ref Range   Squamous Epithelial / LPF FEW (*) RARE   WBC, UA 3-6  <3 WBC/hpf   Bacteria, UA FEW (*) RARE   Urine-Other MUCOUS PRESENT       CT Head Wo Contrast (Final result)  Result time: 02/26/13 14:55:13    Final result by Rad Results In Interface (02/26/13 14:55:13)    Narrative:   CLINICAL DATA: Bronchitis.  Headache.  EXAM: CT HEAD WITHOUT CONTRAST  TECHNIQUE: Contiguous axial images were obtained from the base of the skull through the vertex without intravenous contrast.  COMPARISON: None.  FINDINGS: Skull and Sinuses:Minimal scattered mucosal thickening in the paranasal sinuses. No visible sinus effusion.  Orbits: No acute abnormality.  Brain: No evidence of acute abnormality, such as acute infarction, hemorrhage, hydrocephalus, or mass lesion/mass effect.  IMPRESSION: Negative head CT.   Electronically Signed By: Tiburcio Pea M.D. On: 02/26/2013 14:55             DG Chest 2 View (Final result)  Result time: 02/26/13 14:47:42    Final result by Rad Results In Interface (02/26/13 14:47:42)    Narrative:   CLINICAL DATA: Cough and congestion. Smoker.  EXAM: CHEST 2 VIEW  COMPARISON: PA and lateral chest 12/29/2010.  FINDINGS: Heart size and mediastinal contours are within normal limits. Both lungs are clear. Visualized skeletal structures are unremarkable.  IMPRESSION: Negative exam.   Electronically Signed By: Drusilla Kanner M.D. On: 02/26/2013 14:47          MDM   Lorin Hauck is a 28 y.o. female with a PMH of bronchitis who presents to the ED for evaluation of sore throat and headaches.   Rechecks  4:00 PM = Headache mildly improved after Tylenol. States just wants to be discharged.     Sore throat and cough likely due to URI or other viral syndrome. Rapid strep negative. No evidence of peritonsillar or retropharyngeal abscess. Chest x-ray negative for an acute cardiopulmonary process. Patient also complained of chest pain with coughing. EKG negative for any acute  ischemic changes. Troponin negative. Chest pain possibly musculoskeletal in nature from coughing. Patient also complained of a headache. CT negative for an acute intracranial process. No neurological deficits on exam. Some improvement in headache with Tylenol. Patient also found to  have elevated BP with no hx of HTN. Instructed to follow-up with PCP. No evidence of end organ damage, however, creatinine upper limits of normal (1.2). Stressed importance of follow-up. Return precautions, discharge instructions, and follow-up was discussed with the patient before discharge.     Discharge Medication List as of 02/26/2013  4:05 PM      Final impressions: 1. Hypertensive urgency   2. Sore throat   3. Cough   4. Headache       Luiz IronJessica Katlin Natoya Viscomi PA-C   This patient was discussed with Dr. Vinetta BergamoLockwood          Jakaden Ouzts K Lacharles Altschuler, PA-C 02/26/13 2031

## 2013-02-26 NOTE — ED Notes (Addendum)
Initial Contact - pt to [facility] with c/o sore throat and HA x4 days, 7/10, intermittently but onset yesterday again and unrelieved.  Pt denies n/v/d/c.  Pt denies CP/SOB.  Speaking full/clear sentences, rr even/un-lab.  Pt denies hx of HTN.  Neuros grossly intact.  MAEI, Skin PWD. NAD.  Awaiting EDP eval.

## 2013-02-26 NOTE — ED Notes (Signed)
Pt ret from radiology, resting on stretcher with family at bs.  EDPA aware of BP.  NAD.

## 2013-02-26 NOTE — Discharge Instructions (Signed)
Please follow-up with your doctor as soon as possible to get started on a blood pressure medication  Return to the emergency department if you develop any changing/worsening condition, chest pain, difficult breathing, coughing up blood, severe headache, dizziness, weakness, loss of sensation, or any other concerns (please read additional information regarding your condition below)    Hypertension As your heart beats, it forces blood through your arteries. This force is your blood pressure. If the pressure is too high, it is called hypertension (HTN) or high blood pressure. HTN is dangerous because you may have it and not know it. High blood pressure may mean that your heart has to work harder to pump blood. Your arteries may be narrow or stiff. The extra work puts you at risk for heart disease, stroke, and other problems.  Blood pressure consists of two numbers, a higher number over a lower, 110/72, for example. It is stated as "110 over 72." The ideal is below 120 for the top number (systolic) and under 80 for the bottom (diastolic). Write down your blood pressure today. You should pay close attention to your blood pressure if you have certain conditions such as:  Heart failure.  Prior heart attack.  Diabetes  Chronic kidney disease.  Prior stroke.  Multiple risk factors for heart disease. To see if you have HTN, your blood pressure should be measured while you are seated with your arm held at the level of the heart. It should be measured at least twice. A one-time elevated blood pressure reading (especially in the Emergency Department) does not mean that you need treatment. There may be conditions in which the blood pressure is different between your right and left arms. It is important to see your caregiver soon for a recheck. Most people have essential hypertension which means that there is not a specific cause. This type of high blood pressure may be lowered by changing lifestyle factors  such as:  Stress.  Smoking.  Lack of exercise.  Excessive weight.  Drug/tobacco/alcohol use.  Eating less salt. Most people do not have symptoms from high blood pressure until it has caused damage to the body. Effective treatment can often prevent, delay or reduce that damage. TREATMENT  When a cause has been identified, treatment for high blood pressure is directed at the cause. There are a large number of medications to treat HTN. These fall into several categories, and your caregiver will help you select the medicines that are best for you. Medications may have side effects. You should review side effects with your caregiver. If your blood pressure stays high after you have made lifestyle changes or started on medicines,   Your medication(s) may need to be changed.  Other problems may need to be addressed.  Be certain you understand your prescriptions, and know how and when to take your medicine.  Be sure to follow up with your caregiver within the time frame advised (usually within two weeks) to have your blood pressure rechecked and to review your medications.  If you are taking more than one medicine to lower your blood pressure, make sure you know how and at what times they should be taken. Taking two medicines at the same time can result in blood pressure that is too low. SEEK IMMEDIATE MEDICAL CARE IF:  You develop a severe headache, blurred or changing vision, or confusion.  You have unusual weakness or numbness, or a faint feeling.  You have severe chest or abdominal pain, vomiting, or breathing problems. MAKE  SURE YOU:   Understand these instructions.  Will watch your condition.  Will get help right away if you are not doing well or get worse. Document Released: 12/26/2004 Document Revised: 03/20/2011 Document Reviewed: 08/16/2007 [facility] Patient Information 2014 [place], [place].   Sore Throat A sore throat is pain, burning, irritation, or scratchiness of  the throat. There is often pain or tenderness when swallowing or talking. A sore throat may be accompanied by other symptoms, such as coughing, sneezing, fever, and swollen neck glands. A sore throat is often the first sign of another sickness, such as a cold, flu, strep throat, or mononucleosis (commonly known as mono). Most sore throats go away without medical treatment. CAUSES  The most common causes of a sore throat include:  A viral infection, such as a cold, flu, or mono.  A bacterial infection, such as strep throat, tonsillitis, or whooping cough.  Seasonal allergies.  Dryness in the air.  Irritants, such as smoke or pollution.  Gastroesophageal reflux disease (GERD). HOME CARE INSTRUCTIONS   Only take over-the-counter medicines as directed by your caregiver.  Drink enough fluids to keep your urine clear or pale yellow.  Rest as needed.  Try using throat sprays, lozenges, or sucking on hard candy to ease any pain (if older than 4 years or as directed).  Sip warm liquids, such as broth, herbal tea, or warm water with honey to relieve pain temporarily. You may also eat or drink cold or frozen liquids such as frozen ice pops.  Gargle with salt water (mix 1 tsp salt with 8 oz of water).  Do not smoke and avoid secondhand smoke.  Put a cool-mist humidifier in your bedroom at night to moisten the air. You can also turn on a hot shower and sit in the bathroom with the door closed for 5 10 minutes. SEEK IMMEDIATE MEDICAL CARE IF:  You have difficulty breathing.  You are unable to swallow fluids, soft foods, or your saliva.  You have increased swelling in the throat.  Your sore throat does not get better in 7 days.  You have nausea and vomiting.  You have a fever or persistent symptoms for more than 2 3 days.  You have a fever and your symptoms suddenly get worse. MAKE SURE YOU:   Understand these instructions.  Will watch your condition.  Will get help right away  if you are not doing well or get worse. Document Released: 02/03/2004 Document Revised: 12/13/2011 Document Reviewed: 09/03/2011 [facility] Patient Information 2014 [place], [place].  Cough, Adult  A cough is a reflex that helps clear your throat and airways. It can help heal the body or may be a reaction to an irritated airway. A cough may only last 2 or 3 weeks (acute) or may last more than 8 weeks (chronic).  CAUSES Acute cough:  Viral or bacterial infections. Chronic cough:  Infections.  Allergies.  Asthma.  Post-nasal drip.  Smoking.  Heartburn or acid reflux.  Some medicines.  Chronic lung problems (COPD).  Cancer. SYMPTOMS   Cough.  Fever.  Chest pain.  Increased breathing rate.  High-pitched whistling sound when breathing (wheezing).  Colored mucus that you cough up (sputum). TREATMENT   A bacterial cough may be treated with antibiotic medicine.  A viral cough must run its course and will not respond to antibiotics.  Your caregiver may recommend other treatments if you have a chronic cough. HOME CARE INSTRUCTIONS   Only take over-the-counter or prescription medicines for pain, discomfort, or  fever as directed by your caregiver. Use cough suppressants only as directed by your caregiver.  Use a cold steam vaporizer or humidifier in your bedroom or home to help loosen secretions.  Sleep in a semi-upright position if your cough is worse at night.  Rest as needed.  Stop smoking if you smoke. SEEK IMMEDIATE MEDICAL CARE IF:   You have pus in your sputum.  Your cough starts to worsen.  You cannot control your cough with suppressants and are losing sleep.  You begin coughing up blood.  You have difficulty breathing.  You develop pain which is getting worse or is uncontrolled with medicine.  You have a fever. MAKE SURE YOU:   Understand these instructions.  Will watch your condition.  Will get help right away if you are not doing well or  get worse. Document Released: 06/24/2010 Document Revised: 03/20/2011 Document Reviewed: 06/24/2010 [facility] Patient Information 2014 [place], [place].  Headache A migraine headache is an intense, throbbing pain on one or both sides of your head. A migraine can last for 30 minutes to several hours. CAUSES  The exact cause of a migraine headache is not always known. However, a migraine may be caused when nerves in the brain become irritated and release chemicals that cause inflammation. This causes pain. Certain things may also trigger migraines, such as:  Alcohol.  Smoking.  Stress.  Menstruation.  Aged cheeses.  Foods or drinks that contain nitrates, glutamate, aspartame, or tyramine.  Lack of sleep.  Chocolate.  Caffeine.  Hunger.  Physical exertion.  Fatigue.  Medicines used to treat chest pain (nitroglycerine), birth control pills, estrogen, and some blood pressure medicines. SIGNS AND SYMPTOMS  Pain on one or both sides of your head.  Pulsating or throbbing pain.  Severe pain that prevents daily activities.  Pain that is aggravated by any physical activity.  Nausea, vomiting, or both.  Dizziness.  Pain with exposure to bright lights, loud noises, or activity.  General sensitivity to bright lights, loud noises, or smells. Before you get a migraine, you may get warning signs that a migraine is coming (aura). An aura may include:  Seeing flashing lights.  Seeing bright spots, halos, or zig-zag lines.  Having tunnel vision or blurred vision.  Having feelings of numbness or tingling.  Having trouble talking.  Having muscle weakness. DIAGNOSIS  A migraine headache is often diagnosed based on:  Symptoms.  Physical exam.  A CT scan or MRI of your head. These imaging tests cannot diagnose migraines, but they can help rule out other causes of headaches. TREATMENT Medicines may be given for pain and nausea. Medicines can also be given to help prevent  recurrent migraines.  HOME CARE INSTRUCTIONS  Only take over-the-counter or prescription medicines for pain or discomfort as directed by your health care provider. The use of long-term narcotics is not recommended.  Lie down in a dark, quiet room when you have a migraine.  Keep a journal to find out what may trigger your migraine headaches. For example, write down:  What you eat and drink.  How much sleep you get.  Any change to your diet or medicines.  Limit alcohol consumption.  Quit smoking if you smoke.  Get 7 9 hours of sleep, or as recommended by your health care provider.  Limit stress.  Keep lights dim if bright lights bother you and make your migraines worse. SEEK IMMEDIATE MEDICAL CARE IF:   Your migraine becomes severe.  You have a fever.  You  have a stiff neck.  You have vision loss.  You have muscular weakness or loss of muscle control.  You start losing your balance or have trouble walking.  You feel faint or pass out.  You have severe symptoms that are different from your first symptoms. MAKE SURE YOU:   Understand these instructions.  Will watch your condition.  Will get help right away if you are not doing well or get worse. Document Released: 12/26/2004 Document Revised: 10/16/2012 Document Reviewed: 09/02/2012 [facility] Patient Information 2014 [place], [place].  Emergency Department Resource Guide 1) Find a Doctor and Pay Out of Pocket Although you won't have to find out who is covered by your insurance plan, it is a good idea to ask around and get recommendations. You will then need to call the office and see if the doctor you have chosen will accept you as a new patient and what types of options they offer for patients who are self-pay. Some doctors offer discounts or will set up payment plans for their patients who do not have insurance, but you will need to ask so you aren't surprised when you get to your appointment.  2) Contact Your Local  Health Department Not all health departments have doctors that can see patients for sick visits, but many do, so it is worth a call to see if yours does. If you don't know where your local health department is, you can check in your phone book. The CDC also has a tool to help you locate your state's health department, and many state websites also have listings of all of their local health departments.  3) Find a Walk-in Clinic If your illness is not likely to be very severe or complicated, you may want to try a walk in clinic. These are popping up all over the country in pharmacies, drugstores, and shopping centers. They're usually staffed by nurse practitioners or physician assistants that have been trained to treat common illnesses and complaints. They're usually fairly quick and inexpensive. However, if you have serious medical issues or chronic medical problems, these are probably not your best option.  No Primary Care Doctor: - Call Health Connect at  [phone] - they can help you locate a primary care doctor that  accepts your insurance, provides certain services, etc. - Physician Referral Service- [phone]  Chronic Pain Problems: Organization         Address  Phone   Notes  [facility]  [phone] Patients need to be referred by their primary care doctor.   Medication Assistance: Organization         Address  Phone   Notes  [facility] Medication [facility] [address] [place] [place], [place] [address] [phone] --Must be a resident of [facility] -- Must have NO insurance coverage whatsoever (no Medicaid/ Medicare, etc.) -- The pt. MUST have a primary care doctor that directs their care regularly and follows them in the community   [facility]  [phone]   [employer]  [phone]    Agencies that provide inexpensive medical care: Organization         Address  Phone   Notes  [facility]  [phone]    [facility]    [phone]   [facility] [address] [place], [place] [address] [phone]   [facility] [address] [place] [phone]   Planned Parenthood    (  [phone]   [facility]    [phone]   [facility] and [facility]  [address], [place] Phone:  [phone], Fax:  [phone] Hours of Operation:  9 am - 6 pm, M-F.  Also accepts Medicaid/Medicare and self-pay.  [facility]  [address], [place] Phone: [phone], Fax: [phone]. Hours of Operation:  8:30 am - 5:30 pm, M-F.  Also accepts Medicaid and self-pay.  [facility] [place] [address] [place] Phone: [phone]   [facility] [address] Natasha Bence [place], [place] [phone], Ext. [phone] Mondays & Thursdays: 7-9 AM.  First 15 patients are seen on a first come, first serve basis.    Medicaid-accepting [facility] Providers:  Organization         Address  Phone   Notes  [facility] [address],  [phone] Also accepts self-pay patients.  [facility] [address] Laurell Josephs [place], [place]  [phone]   [facility] [address], [place] [phone]   [facility] [address] [place] [phone]   Renaye Rakers [address], [place]   [phone] Only accepts [place] Access [place] patients after they have their name applied to their card.   Self-Pay (no insurance) in [facility]:  Organization         Address  Phone   Notes  Sickle Cell Patients, [facility] [address] [place], [place] [phone]   [facility] [address] [place], [place] [phone]   [facility] [place]  [address],  [place] [phone]   [facility]/Dr. Osei-Bonsu  [address], [place] or [address], [place] [phone] Phone number for both [place] and [place] locations is the same.  [facility] [address] [place] [phone]   [facility] [address], [place] or [address] [phone]   [facility] [address], [place] [phone], phone; [phone], fax Sees patients 1st and 3rd Saturday of every month.  Must not qualify for public or private insurance (i.e. Medicaid, Medicare, [place] Health Choice, Veterans' Benefits)  Household income should be no more than 200% of the poverty level The clinic cannot treat you if you are pregnant or think you are pregnant  Sexually transmitted diseases are not treated at the clinic.    Dental Care: Organization         Address  Phone  Notes  [facility] [address] [place], [place] [phone] Accepts children up to age 67 who are enrolled in [place] or [place] Health Choice; pregnant women with a Medicaid card; and children who have applied for Medicaid or [place] Health Choice, but were declined, whose parents can pay a reduced fee at time of service.  [facility] of [facility]  [address], [place] [phone] Accepts children up to age 70 who are enrolled in [place] or [place] Health Choice; pregnant women with a Medicaid card; and children who have applied for Medicaid or  Health Choice, but were declined, whose parents can pay a reduced  fee at time of service.  [facility]  [address], [place] [phone]) [phone] Patients are seen by appointment only. Walk-ins are not accepted. [facility] will see patients 30 years of age and older. Monday - Tuesday (8am-5pm) Most Wednesdays  (8:30-5pm) $30 per visit, cash only  [facility]  [address], [facility] [phone] Patients are seen by appointment only. Walk-ins are not accepted. [facility] will see patients 30 years of age and older. One Wednesday Evening (Monthly: Volunteer Based).  $30 per visit, cash only  [employer] [facility] [facility]  [phone] for adults; Children under age 694, call Graduate Pediatric Dentistry at [phone]) [phone]. Children aged 334-14, please call [phone] to request a pediatric application.  Dental services are provided in all areas of dental care including fillings, crowns and bridges, complete and partial dentures, implants, gum treatment, root canals, and extractions. Preventive care is also provided. Treatment is provided to both adults and children. Patients are selected via a lottery and there is often a waiting list.   [facility] [address], [place]  [phone] [email or web address]   [facility] [address], [place], [place] [phone], Ext. [phone] Second and Fourth Thursday of each month, opens at 6:30 AM; Clinic ends at 9 AM.  Patients are seen on a first-come first-served basis, and a limited number are seen during each clinic.   [facility]  [address], [place], [place] [phone]   Eligibility Requirements You must have lived in [place], [place], or [place] for at least the last three months.   You cannot be eligible for state or federal sponsored [place]healthcare insurance, including CIGNAVeterans Administration, [place]icaid, or [employer]icare.   You generally cannot be eligible for healthcare insurance through your employer.    How to apply: Eligibility screenings are held every Tuesday and Wednesday afternoon from 1:00 pm until 4:00 pm. You do not need an appointment for the interview!  [facility] [address], [place], [place] [phone]   [facility]  [phone]   [facility]  [phone]   [facility]  [phone]    Behavioral Health Resources in the Community: Intensive Outpatient Programs Organization         Address  Phone  Notes  [facility] [address], [place], [place] [phone]   [facility] [address], [place], [place] [phone]   [facility] [address], [place], [place]  [phone]   [facility] [address],  [place], [place] [phone] or [phone]   Substance Abuse Resources Organization         Address  Phone  Notes  [facility]  [phone]   [facility]  [phone]   [facility]  [phone]   [facility]  [phone]   Residential & Outpatient Substance Abuse Program  [phone]   Psychological Services Organization         Address  Phone  Notes  [facility]  [phone]- [phone]   [facility]  [phone]- [phone]   [facility] [address], [place] [phone] or [phone]    Mobile Crisis Teams Organization         Address  Phone  Notes  [facility], Mobile Crisis Care Unit  [phone]  [facility]  [address] [place], [place] [phone]   [facility] [address] [place] [phone]    Self-Help/Support Groups Organization         Address  Phone             Notes  [facility] [place] - variety of support groups  [phone]- [phone] Call for more information  Narcotics Anonymous (NA), [facility] [address], [employer] [place]  2 meetings at this location   Statistician         Address  Phone  Notes  [facility] [address],    [place]  [phone]   [facility]  [address], [place] [address], [place], [place]  [phone]   [facility] [address] [place], [place] [phone] Admissions: 8am-3pm M-F  [facility] [address]    [place], [place] [phone]   [facility] [address] [place], [place], [place] [phone]   [facility] [address]  [place], [place] [phone]   [facility] - Intensive Outpatient [address]ure[address], [place], [place] [phone]   [facility] ([facility] [address] [place].,  [place], [place] [phone] or [phone]   [facility] ([facility]) [address] [place], [place] [phone] Accepts Medicaid  [facility] [address]  [place] [phone] Substance Abuse/Addiction Treatment   [facility] Organization         Address  Phone  Notes  [facility]  [phone]   Angie Fava, PhD [address] [place] [place], [place]   [phone] or [phone]   [facility]   [address] [place], [place] [phone]   [facility] [address] [place], [place] [phone] Insurance/Medicaid/sponsorship through [facility] and Families [address], [address]                                    [place], [place] [phone] Therapy/tele-psych/case  [facility] [address], [place] [phone]    Dr. Lolly Mustache  [phone]   [facility]  [employer] [facility] Dept. 1) [address] [place] 2) [address] [place] 3)  [address], [place] [phone]  [phone]   [facility] Child Abuse Hotline [phone] or [phone] (After Hours)

## 2013-02-26 NOTE — ED Notes (Signed)
Pt to radiology.

## 2013-02-27 NOTE — ED Provider Notes (Signed)
  Medical screening examination/treatment/procedure(s) were performed by non-physician practitioner and as supervising physician I was immediately available for consultation/collaboration.  EKG Interpretation    Date/Time:  Wednesday February 26 2013 13:48:14 EST Ventricular Rate:  94 PR Interval:  179 QRS Duration: 71 QT Interval:  356 QTC Calculation: 445 R Axis:   38 Text Interpretation:  Sinus rhythm Left atrial enlargement Minimal ST depression, inferior leads Sinus rhythm Left atrial enlargement No significant change since last tracing Abnormal ekg Confirmed by Gerhard MunchLOCKWOOD, Shariece Viveiros  MD ([id number]) on 02/26/2013 2:09:33 PM               Gerhard Munchobert Halimah Bewick, MD 02/27/13 1534

## 2013-02-28 ENCOUNTER — Emergency Department (INDEPENDENT_AMBULATORY_CARE_PROVIDER_SITE_OTHER)
Admission: EM | Admit: 2013-02-28 | Discharge: 2013-02-28 | Disposition: A | Discharge status: Home / Self Care | Payer: Medicaid Other | Source: Home / Self Care | Attending: Family Medicine | Admitting: Family Medicine

## 2013-02-28 ENCOUNTER — Encounter (HOSPITAL_COMMUNITY): Payer: Self-pay | Admitting: Emergency Medicine

## 2013-02-28 ENCOUNTER — Observation Stay (HOSPITAL_COMMUNITY)
Admission: EM | Admit: 2013-02-28 | Discharge: 2013-03-02 | Disposition: A | Discharge status: Home / Self Care | Payer: Medicaid Other | Attending: Internal Medicine | Admitting: Internal Medicine

## 2013-02-28 DIAGNOSIS — I160 Hypertensive urgency: Secondary | ICD-10-CM

## 2013-02-28 DIAGNOSIS — R079 Chest pain, unspecified: Secondary | ICD-10-CM | POA: Diagnosis present

## 2013-02-28 DIAGNOSIS — Z720 Tobacco use: Secondary | ICD-10-CM

## 2013-02-28 DIAGNOSIS — Z8742 Personal history of other diseases of the female genital tract: Secondary | ICD-10-CM | POA: Insufficient documentation

## 2013-02-28 DIAGNOSIS — R809 Proteinuria, unspecified: Secondary | ICD-10-CM

## 2013-02-28 DIAGNOSIS — Z888 Allergy status to other drugs, medicaments and biological substances status: Secondary | ICD-10-CM | POA: Insufficient documentation

## 2013-02-28 DIAGNOSIS — Z8709 Personal history of other diseases of the respiratory system: Secondary | ICD-10-CM | POA: Insufficient documentation

## 2013-02-28 DIAGNOSIS — I10 Essential (primary) hypertension: Secondary | ICD-10-CM

## 2013-02-28 DIAGNOSIS — F17200 Nicotine dependence, unspecified, uncomplicated: Secondary | ICD-10-CM | POA: Insufficient documentation

## 2013-02-28 DIAGNOSIS — R51 Headache: Secondary | ICD-10-CM | POA: Insufficient documentation

## 2013-02-28 DIAGNOSIS — Z3202 Encounter for pregnancy test, result negative: Secondary | ICD-10-CM | POA: Insufficient documentation

## 2013-02-28 HISTORY — DX: Tobacco use: Z72.0

## 2013-02-28 HISTORY — DX: Proteinuria, unspecified: R80.9

## 2013-02-28 HISTORY — DX: Irregular menstruation, unspecified: N92.6

## 2013-02-28 HISTORY — DX: Essential (primary) hypertension: I10

## 2013-02-28 LAB — CULTURE, GROUP A STREP

## 2013-02-28 LAB — BASIC METABOLIC PANEL
BUN: 11 mg/dL (ref 6–23)
CALCIUM: 9.1 mg/dL (ref 8.4–10.5)
CO2: 24 meq/L (ref 19–32)
CREATININE: 1.1 mg/dL (ref 0.50–1.10)
Chloride: 101 mEq/L (ref 96–112)
GFR calc Af Amer: 78 mL/min — ABNORMAL LOW (ref >90)
GFR, EST NON AFRICAN AMERICAN: 67 mL/min — AB (ref >90)
Glucose, Bld: 90 mg/dL (ref 70–99)
Potassium: 4.3 mEq/L (ref 3.7–5.3)
SODIUM: 137 meq/L (ref 137–147)

## 2013-02-28 LAB — POCT URINALYSIS DIP (DEVICE)
Bilirubin Urine: NEGATIVE
Glucose, UA: NEGATIVE mg/dL
Ketones, ur: NEGATIVE mg/dL
LEUKOCYTES UA: NEGATIVE
Nitrite: NEGATIVE
Protein, ur: >=300 mg/dL — AB
UROBILINOGEN UA: 0.2 mg/dL (ref 0.0–1.0)
pH: 5.5 (ref 5.0–8.0)

## 2013-02-28 LAB — URINALYSIS, ROUTINE W REFLEX MICROSCOPIC
Bilirubin Urine: NEGATIVE
Glucose, UA: NEGATIVE mg/dL
Ketones, ur: NEGATIVE mg/dL
LEUKOCYTES UA: NEGATIVE
NITRITE: NEGATIVE
PH: 5.5 (ref 5.0–8.0)
Protein, ur: >300 mg/dL — AB
SPECIFIC GRAVITY, URINE: 1.021 (ref 1.005–1.030)
UROBILINOGEN UA: 0.2 mg/dL (ref 0.0–1.0)

## 2013-02-28 LAB — CBC WITH DIFFERENTIAL/PLATELET
BASOS ABS: 0.1 10*3/uL (ref 0.0–0.1)
BASOS PCT: 1 % (ref 0–1)
Eosinophils Absolute: 0.5 10*3/uL (ref 0.0–0.7)
Eosinophils Relative: 5 % (ref 0–5)
HCT: 42 % (ref 36.0–46.0)
Hemoglobin: 14.7 g/dL (ref 12.0–15.0)
LYMPHS PCT: 30 % (ref 12–46)
Lymphs Abs: 3 10*3/uL (ref 0.7–4.0)
MCH: 30.3 pg (ref 26.0–34.0)
MCHC: 35 g/dL (ref 30.0–36.0)
MCV: 86.6 fL (ref 78.0–100.0)
Monocytes Absolute: 0.7 10*3/uL (ref 0.1–1.0)
Monocytes Relative: 7 % (ref 3–12)
NEUTROS ABS: 5.7 10*3/uL (ref 1.7–7.7)
NEUTROS PCT: 57 % (ref 43–77)
PLATELETS: 286 10*3/uL (ref 150–400)
RBC: 4.85 MIL/uL (ref 3.87–5.11)
RDW: 13.1 % (ref 11.5–15.5)
WBC: 9.9 10*3/uL (ref 4.0–10.5)

## 2013-02-28 LAB — POCT I-STAT, CHEM 8
BUN: 11 mg/dL (ref 6–23)
CALCIUM ION: 1.17 mmol/L (ref 1.12–1.23)
Chloride: 102 mEq/L (ref 96–112)
Creatinine, Ser: 1.2 mg/dL — ABNORMAL HIGH (ref 0.50–1.10)
Glucose, Bld: 106 mg/dL — ABNORMAL HIGH (ref 70–99)
HEMATOCRIT: 50 % — AB (ref 36.0–46.0)
Hemoglobin: 17 g/dL — ABNORMAL HIGH (ref 12.0–15.0)
POTASSIUM: 3.9 meq/L (ref 3.7–5.3)
Sodium: 139 mEq/L (ref 137–147)
TCO2: 25 mmol/L (ref 0–100)

## 2013-02-28 LAB — I-STAT TROPONIN, ED: Troponin i, poc: 0 ng/mL (ref 0.00–0.08)

## 2013-02-28 LAB — RAPID URINE DRUG SCREEN, HOSP PERFORMED
Amphetamines: NOT DETECTED
Barbiturates: NOT DETECTED
Benzodiazepines: NOT DETECTED
COCAINE: NOT DETECTED
OPIATES: NOT DETECTED
Tetrahydrocannabinol: NOT DETECTED

## 2013-02-28 LAB — TROPONIN I: Troponin I: <0.3 ng/mL (ref <0.30)

## 2013-02-28 LAB — URINE MICROSCOPIC-ADD ON

## 2013-02-28 LAB — PREGNANCY, URINE: Preg Test, Ur: NEGATIVE

## 2013-02-28 LAB — MRSA PCR SCREENING: MRSA by PCR: NEGATIVE

## 2013-02-28 MED ORDER — ACETAMINOPHEN 325 MG PO TABS
650.0000 mg | ORAL_TABLET | Freq: Four times a day (QID) | ORAL | Status: DC | PRN
  Administered 2013-02-28 – 2013-03-01 (×3): 650 mg via ORAL
  Filled 2013-02-28 (×3): qty 2

## 2013-02-28 MED ORDER — LABETALOL HCL 5 MG/ML IV SOLN
10.0000 mg | Freq: Once | INTRAVENOUS | Status: AC
  Administered 2013-02-28: 10 mg via INTRAVENOUS
  Filled 2013-02-28: qty 4

## 2013-02-28 MED ORDER — METOPROLOL TARTRATE 1 MG/ML IV SOLN
5.0000 mg | Freq: Once | INTRAVENOUS | Status: AC
  Administered 2013-02-28: 5 mg via INTRAVENOUS
  Filled 2013-02-28: qty 5

## 2013-02-28 MED ORDER — MORPHINE SULFATE 4 MG/ML IJ SOLN
4.0000 mg | Freq: Once | INTRAMUSCULAR | Status: AC
  Administered 2013-02-28: 4 mg via INTRAVENOUS
  Filled 2013-02-28: qty 1

## 2013-02-28 MED ORDER — ENOXAPARIN SODIUM 40 MG/0.4ML ~~LOC~~ SOLN
40.0000 mg | SUBCUTANEOUS | Status: DC
  Administered 2013-02-28 – 2013-03-01 (×2): 40 mg via SUBCUTANEOUS
  Filled 2013-02-28 (×3): qty 0.4

## 2013-02-28 MED ORDER — SODIUM CHLORIDE 0.9 % IJ SOLN
3.0000 mL | Freq: Two times a day (BID) | INTRAMUSCULAR | Status: DC
  Administered 2013-02-28 – 2013-03-02 (×4): 3 mL via INTRAVENOUS

## 2013-02-28 MED ORDER — AMLODIPINE BESYLATE 5 MG PO TABS
5.0000 mg | ORAL_TABLET | Freq: Every day | ORAL | Status: DC
  Administered 2013-02-28: 5 mg via ORAL
  Filled 2013-02-28 (×2): qty 1

## 2013-02-28 MED ORDER — METOPROLOL TARTRATE 25 MG PO TABS
25.0000 mg | ORAL_TABLET | Freq: Two times a day (BID) | ORAL | Status: DC
  Administered 2013-02-28 – 2013-03-01 (×2): 25 mg via ORAL
  Filled 2013-02-28 (×3): qty 1

## 2013-02-28 MED ORDER — OXYCODONE-ACETAMINOPHEN 5-325 MG PO TABS
1.0000 | ORAL_TABLET | Freq: Once | ORAL | Status: AC
  Administered 2013-02-28: 1 via ORAL
  Filled 2013-02-28: qty 1

## 2013-02-28 MED ORDER — ONDANSETRON HCL 4 MG/2ML IJ SOLN
4.0000 mg | Freq: Once | INTRAMUSCULAR | Status: AC
  Administered 2013-02-28: 4 mg via INTRAVENOUS
  Filled 2013-02-28: qty 2

## 2013-02-28 MED ORDER — HYDRALAZINE HCL 20 MG/ML IJ SOLN
10.0000 mg | Freq: Four times a day (QID) | INTRAMUSCULAR | Status: DC | PRN
  Administered 2013-02-28: 10 mg via INTRAVENOUS

## 2013-02-28 MED ORDER — ASPIRIN EC 325 MG PO TBEC
325.0000 mg | DELAYED_RELEASE_TABLET | Freq: Every day | ORAL | Status: DC
  Administered 2013-02-28 – 2013-03-01 (×2): 325 mg via ORAL
  Filled 2013-02-28 (×2): qty 1

## 2013-02-28 MED ORDER — ONDANSETRON HCL 4 MG/2ML IJ SOLN
INTRAMUSCULAR | Status: AC
  Administered 2013-02-28: 4 mg
  Filled 2013-02-28: qty 2

## 2013-02-28 MED ORDER — ACETAMINOPHEN 650 MG RE SUPP: 650.0000 mg | Freq: Four times a day (QID) | RECTAL | Status: DC | PRN

## 2013-02-28 MED ORDER — MORPHINE SULFATE 4 MG/ML IJ SOLN: 4.0000 mg | Freq: Once | INTRAMUSCULAR | Status: DC

## 2013-02-28 MED ORDER — OXYCODONE HCL 5 MG PO TABS
5.0000 mg | ORAL_TABLET | ORAL | Status: DC | PRN
  Administered 2013-02-28 – 2013-03-01 (×2): 5 mg via ORAL
  Filled 2013-02-28 (×3): qty 1

## 2013-02-28 NOTE — H&P (Signed)
[facility] History and Physical  Anah Billard ZOX:[id number] DOB: 1985/04/08 DOA: 02/28/2013   PCP: No PCP Per Patient   Chief Complaint: Chest discomfort, headache, elevated blood pressure  HPI:  28 year old female with a recent diagnosis of hypertension without any physician followup. The patient was seen at the [facility] emergency department on 02/26/2013 for URI type symptoms with headache. The patient was diagnosed with hypertension at that time. Her blood pressure in the ED at that time was 182/102. Patient was sent home and told to follow up with her primary care physician. The patient was not given any prescriptions. The patient stated that her headache did not significantly improve. She has been taking Goody's powders in addition to acetaminophen over the past 2 days. The patient states that she T does not use any other NSAIDs nor did she use any illegal drugs nor any oral contraceptives. here has been no significant only for headache. In addition, the patient did complain of chest discomfort substernal in nature associated with coughing. The chest discomfort also occurs at rest. The patient has intermittent green sputum but denies any shortness breath, hemoptysis. She does have some subjective fevers and chills at home. Chest x-ray on 02/26/2013 was negative for any infiltrates. The patient went to urgent care today. Urinalysis showed proteinuria >300mg /dL without any significant pyuria. As a result, the patient was sent to the emergency department for further evaluation. In the ED, the patient was noted to have blood pressure 207/116. The patient was given labetalol 10 mg IV. There is no significant improvement in the patient's headache or blood pressure. Because of the patient's chest discomfort and intractable evidence of urgency, observation was requested.  In ED, BMP was essentially unremarkable with a serum creatinine 1.10 CBC was essentially unremarkable. Troponin was negative. EKG was  sinus rhythm with nonspecific T-wave changes, normal axis.. Assessment/Plan: Atypical chest discomfort -This may be related to her recent URI as well as HTN urgency -cycle troponins -EKG is reassuring -02/26/2013 chest x-ray was negative for infiltrates Hypertensive urgency -I have ordered an additional dose of labetalol 10 mg IV x1 in the emergency department -Pt will be started on metoprolol tartrate as well as amlodipine -Urine drug screen -TSH  -hydralazine prn SBP>180 Intractable headache  -CT brain  -No focal neurologic deficit at this time  Proteinuria  -This is likely a result of the patient's uncontrolled hypertension  -no peripheral edema at this time to suggest nephrotic syndrome Tobacco Abuse -tobacco cessation discussed     Past Medical History  Diagnosis Date  . Bronchitis   . Irregular menstrual cycle    History reviewed. No pertinent past surgical history. Social History:  reports that she has been smoking.  She does not have any smokeless tobacco history on file. She reports that she does not drink alcohol or use illicit drugs.   Family Hx: mother has HTN; father medical hx unknown;  Aunt and maternal grandmother has HTN  Allergies  Allergen Reactions  . Ibuprofen Hives      Prior to Admission medications   Medication Sig Start Date End Date Taking? Authorizing Provider  acetaminophen (TYLENOL) 500 MG tablet Take 1,000 mg by mouth every 6 (six) hours as needed for mild pain.    Yes Historical Provider, MD    Review of Systems:  Constitutional:  No weight loss, night sweats,  fatigue.  Head&Eyes: No headache.  No vision loss.  No eye pain or scotoma ENT:  No Difficulty swallowing,Tooth/dental problems,Sore throat,  No ear  ache, post nasal drip,  Cardio-vascular:  No Orthopnea, PND, swelling in lower extremities,  dizziness, palpitations  GI:  No  abdominal pain, nausea, vomiting, diarrhea, loss of appetite, hematochezia, melena, heartburn,  indigestion, Resp:  No shortness of breath with exertion or at rest.  No coughing up of blood .No wheezing.No chest wall deformity  Skin:  no rash or lesions.  GU:  no dysuria, change in color of urine, no urgency or frequency. No flank pain.  Musculoskeletal:  No joint pain or swelling. No decreased range of motion. No back pain.  Psych:  No change in mood or affect. No depression or anxiety. Neurologic: No headache, no dysesthesia, no focal weakness, no vision loss. No syncope  Physical Exam: Filed Vitals:   02/28/13 1730 02/28/13 1800 02/28/13 1815 02/28/13 1830  BP: 188/123 202/131 203/133 208/136  Pulse: 87 93 86 92  Temp:      TempSrc:      Resp: 11     SpO2: 99% 100% 100% 100%   General:  A&O x 3, NAD, nontoxic, pleasant/cooperative Head/Eye: No conjunctival hemorrhage, no icterus, [place]/AT, No nystagmus ENT:  No icterus,  No thrush, good dentition, no pharyngeal exudate Neck:  No masses, no lymphadenpathy, no bruits CV:  RRR, no rub, no gallop, no S3 Lung:  CTAB, good air movement, no wheeze, no rhonchi Abdomen: soft/NT, +BS, nondistended, no peritoneal signs Ext: No cyanosis, No rashes, No petechiae, No lymphangitis, No edema Neuro: CNII-XII intact, strength 4/5 in bilateral upper and lower extremities, no dysmetria  Labs on Admission:  Basic Metabolic Panel:  Recent Labs Lab 02/26/13 1340 02/28/13 1308 02/28/13 1627  NA 137 139 137  K 4.1 3.9 4.3  CL 100 102 101  CO2 25  --  24  GLUCOSE 98 106* 90  BUN 9 11 11   CREATININE 1.21* 1.20* 1.10  CALCIUM 9.4  --  9.1   Liver Function Tests: No results found for this basename: AST, ALT, ALKPHOS, BILITOT, PROT, ALBUMIN,  in the last 168 hours No results found for this basename: LIPASE, AMYLASE,  in the last 168 hours No results found for this basename: AMMONIA,  in the last 168 hours CBC:  Recent Labs Lab 02/26/13 1340 02/28/13 1308 02/28/13 1627  WBC 10.6*  --  9.9  NEUTROABS 6.6  --  5.7  HGB 15.2*  17.0* 14.7  HCT 43.5 50.0* 42.0  MCV 86.7  --  86.6  PLT 297  --  286   Cardiac Enzymes:  Recent Labs Lab 02/26/13 1340 02/28/13 1627  TROPONINI <0.30 <0.30   BNP: No components found with this basename: POCBNP,  CBG: No results found for this basename: GLUCAP,  in the last 168 hours  Radiological Exams on Admission: No results found.  EKG: Independently reviewed. Sinus rhythm, nonspecific T wave changes.    Time spent:60 minutes Code Status:   FULL Family Communication:   Fianc at bedside   Azarion Hove, DO  [facility] Pager [phone]  If 7PM-7AM, please contact night-coverage [email or web address] Password [facility] 02/28/2013, 6:36 PM

## 2013-02-28 NOTE — ED Provider Notes (Signed)
CSN: [id number]     Arrival date & time 02/28/13  1356 History   First MD Initiated Contact with Patient 02/28/13 1510     Chief Complaint  Patient presents with  . Hypertension     (Consider location/radiation/quality/duration/timing/severity/associated sxs/prior Treatment) HPI  Patient has as PMH of bronchitis and hypertension (diagnosed two days ago). She has a family history- mom has hypertension and has had stroke and is on dialysis due to complications of uncontrolled hypertension. She has had a headache for the past few days. [facility] and diagnosed with headache/hypertention and told to f/u with a PCP and not started on any medications. She has an appointment set up for 3/5 but today her headache was bad so she went to Urgent Care this afternoon. [facility] and sent to the ER for control of hypertensive Emergency. She had a Head CT on 2/18 which was not acute. She has labs done that shows some elevated protein. She denies ever being on medication for hypertension and denies any other symptoms at this time.  Past Medical History  Diagnosis Date  . Bronchitis   . Irregular menstrual cycle    History reviewed. No pertinent past surgical history. No family history on file. History  Substance Use Topics  . Smoking status: Current Every Day Smoker -- 0.50 packs/day for 5 years  . Smokeless tobacco: Not on file  . Alcohol Use: No   OB History   Grav Para Term Preterm Abortions TAB SAB Ect Mult Living                 Review of Systems  The patient denies anorexia, fever, weight loss,, vision loss, decreased hearing, hoarseness, chest pain, syncope, dyspnea on exertion, peripheral edema, balance deficits, hemoptysis, abdominal pain, melena, hematochezia, severe indigestion/heartburn, hematuria, incontinence, genital sores, muscle weakness, suspicious skin lesions, transient blindness, difficulty walking, depression, unusual weight change, abnormal  bleeding, enlarged lymph nodes, angioedema, and breast masses.   Allergies  Ibuprofen  Home Medications   Current Outpatient Rx  Name  Route  Sig  Dispense  Refill  . acetaminophen (TYLENOL) 500 MG tablet   Oral   Take 1,000 mg by mouth every 6 (six) hours as needed for mild pain.           BP 198/123  Pulse 101  Temp(Src) 98.4 F (36.9 C) (Oral)  Resp 24  SpO2 100%  LMP 11/23/2012 Physical Exam  Nursing note and vitals reviewed. Constitutional: She is oriented to person, place, and time. She appears well-developed and well-nourished. No distress.  HENT:  Head: Normocephalic and atraumatic.  Eyes: Pupils are equal, round, and reactive to light.  Neck: Normal range of motion. Neck supple.  Cardiovascular: Normal rate and regular rhythm.   Pulmonary/Chest: Effort normal.  Abdominal: Soft.  Neurological: She is alert and oriented to person, place, and time. She has normal strength. No cranial nerve deficit or sensory deficit.  Skin: Skin is warm and dry.    ED Course  Procedures (including critical care time) Labs Review Labs Reviewed - No data to display Imaging Review No results found.  EKG Interpretation   None       MDM   Final diagnoses:  None    Discussed case with Dr. Gwendolyn GrantWalden. Pt has had a head CT therefore we will forgo that at this time. Will get cbc, bmp, troponin and urinalysis. Will give patient 10 mg IV Labetalol and then  admit for hypertensive urgency.  Patient given the Labetalol and her blood pressure is responding positively. Dr. Arbutus Leas with [facility] agreed to come see patient in the ED and will most likely admit.     Dorthula Matas, PA-C 02/28/13 1831

## 2013-02-28 NOTE — ED Notes (Signed)
Pt. Is aware of needing a urine specimen. She is unable at the present time

## 2013-02-28 NOTE — ED Notes (Signed)
Per pt's visitor, Pt states that she "felt something in her head" and then vomited.

## 2013-02-28 NOTE — ED Notes (Signed)
[facility] and had high BP but was not put on anything. Told to f/u with someone for medicine. Still has a HA this morning but BP is high.

## 2013-02-28 NOTE — ED Notes (Signed)
Pt stated "I've been told that I have high blood pressure and need to be on medicine for it but I don't have a doctor. I've been looking for one"

## 2013-02-28 NOTE — ED Notes (Addendum)
Patient seen in [place] ed for s/t, uri symptoms on 02/26/13.  Headache and elevated blood pressure continues and bp has worsened.

## 2013-02-28 NOTE — ED Provider Notes (Signed)
CSN: [id number]     Arrival date & time 02/28/13  1226 History   First MD Initiated Contact with Patient 02/28/13 1237     Chief Complaint  Patient presents with  . Hypertension     (Consider location/radiation/quality/duration/timing/severity/associated sxs/prior Treatment) Patient is a 28 y.o. female presenting with hypertension. The history is provided by the patient.  Hypertension This is a new problem. The current episode started 2 days ago. The problem has been gradually worsening. Associated symptoms include headaches. Pertinent negatives include no chest pain and no abdominal pain.    Past Medical History  Diagnosis Date  . Bronchitis    History reviewed. No pertinent past surgical history. No family history on file. History  Substance Use Topics  . Smoking status: Current Every Day Smoker -- 0.50 packs/day for 5 years  . Smokeless tobacco: Not on file  . Alcohol Use: No   OB History   Grav Para Term Preterm Abortions TAB SAB Ect Mult Living                 Review of Systems  Constitutional: Negative.   Respiratory: Negative.   Cardiovascular: Negative for chest pain and leg swelling.  Gastrointestinal: Negative.  Negative for abdominal pain.  Neurological: Positive for headaches.      Allergies  Ibuprofen  Home Medications   Current Outpatient Rx  Name  Route  Sig  Dispense  Refill  . acetaminophen (TYLENOL) 500 MG tablet   Oral   Take 1,000 mg by mouth every 6 (six) hours as needed for pain.          BP 222/146  Pulse 110  Temp(Src) 98.6 F (37 C) (Oral)  Resp 16  SpO2 96% Physical Exam  Nursing note and vitals reviewed. Constitutional: She is oriented to person, place, and time. She appears well-developed and well-nourished. No distress.  Cardiovascular: Regular rhythm, normal heart sounds and normal pulses.  Tachycardia present.   No murmur heard. Pulmonary/Chest: Effort normal and breath sounds normal.  Neurological: She is alert and  oriented to person, place, and time.  Skin: Skin is warm and dry.    ED Course  Procedures (including critical care time) Labs Review Labs Reviewed  POCT I-STAT, CHEM 8 - Abnormal; Notable for the following:    Creatinine, Ser 1.20 (*)    Glucose, Bld 106 (*)    Hemoglobin 17.0 (*)    HCT 50.0 (*)    All other components within normal limits  POCT URINALYSIS DIP (DEVICE) - Abnormal; Notable for the following:    Hgb urine dipstick SMALL (*)    Protein, ur >=300 (*)    All other components within normal limits  I-STAT CHEM 8, ED   Imaging Review Dg Chest 2 View  02/26/2013   CLINICAL DATA:  Cough and congestion.  Smoker.  EXAM: CHEST  2 VIEW  COMPARISON:  PA and lateral chest 12/29/2010.  FINDINGS: Heart size and mediastinal contours are within normal limits. Both lungs are clear. Visualized skeletal structures are unremarkable.  IMPRESSION: Negative exam.   Electronically Signed   By: Drusilla Kanner M.D.   On: 02/26/2013 14:47   Ct Head Wo Contrast  02/26/2013   CLINICAL DATA:  Bronchitis.  Headache.  EXAM: CT HEAD WITHOUT CONTRAST  TECHNIQUE: Contiguous axial images were obtained from the base of the skull through the vertex without intravenous contrast.  COMPARISON:  None.  FINDINGS: Skull and Sinuses:Minimal scattered mucosal thickening in the paranasal sinuses. No visible sinus  effusion.  Orbits: No acute abnormality.  Brain: No evidence of acute abnormality, such as acute infarction, hemorrhage, hydrocephalus, or mass lesion/mass effect.  IMPRESSION: Negative head CT.   Electronically Signed   By: Tiburcio PeaJonathan  Watts M.D.   On: 02/26/2013 14:55      MDM   Final diagnoses:  Hypertensive urgency   Sent for hosp for eval and treatment of hypertensive urgency./per dr Arrie Arancoladonato.     Linna HoffJames D Rosette Bellavance, MD 02/28/13 [phone]

## 2013-02-28 NOTE — ED Notes (Signed)
Pt states she is unable to urinate at this time.

## 2013-03-01 ENCOUNTER — Observation Stay (HOSPITAL_COMMUNITY): Payer: Medicaid Other

## 2013-03-01 ENCOUNTER — Encounter (HOSPITAL_COMMUNITY): Payer: Self-pay | Admitting: Neurology

## 2013-03-01 LAB — TSH: TSH: 1.076 u[IU]/mL (ref 0.350–4.500)

## 2013-03-01 LAB — BASIC METABOLIC PANEL
BUN: 12 mg/dL (ref 6–23)
CALCIUM: 9 mg/dL (ref 8.4–10.5)
CO2: 25 mEq/L (ref 19–32)
Chloride: 98 mEq/L (ref 96–112)
Creatinine, Ser: 1.13 mg/dL — ABNORMAL HIGH (ref 0.50–1.10)
GFR calc Af Amer: 75 mL/min — ABNORMAL LOW (ref >90)
GFR calc non Af Amer: 65 mL/min — ABNORMAL LOW (ref >90)
Glucose, Bld: 129 mg/dL — ABNORMAL HIGH (ref 70–99)
Potassium: 4.2 mEq/L (ref 3.7–5.3)
Sodium: 136 mEq/L — ABNORMAL LOW (ref 137–147)

## 2013-03-01 LAB — TROPONIN I

## 2013-03-01 MED ORDER — METOPROLOL TARTRATE 1 MG/ML IV SOLN
5.0000 mg | Freq: Once | INTRAVENOUS | Status: AC
  Administered 2013-03-01: 5 mg via INTRAVENOUS

## 2013-03-01 MED ORDER — LISINOPRIL 5 MG PO TABS
5.0000 mg | ORAL_TABLET | Freq: Every day | ORAL | Status: DC
  Administered 2013-03-01: 5 mg via ORAL
  Filled 2013-03-01: qty 1

## 2013-03-01 MED ORDER — NITROGLYCERIN IN D5W 200-5 MCG/ML-% IV SOLN
2.0000 ug/min | INTRAVENOUS | Status: DC
  Administered 2013-03-01: 10 ug/min via INTRAVENOUS

## 2013-03-01 MED ORDER — AMLODIPINE BESYLATE 5 MG PO TABS
5.0000 mg | ORAL_TABLET | Freq: Once | ORAL | Status: AC
  Administered 2013-03-01: 5 mg via ORAL
  Filled 2013-03-01: qty 1

## 2013-03-01 MED ORDER — ONDANSETRON HCL 4 MG/2ML IJ SOLN
4.0000 mg | Freq: Four times a day (QID) | INTRAMUSCULAR | Status: DC | PRN
  Administered 2013-03-01: 4 mg via INTRAVENOUS
  Filled 2013-03-01: qty 2

## 2013-03-01 MED ORDER — AMLODIPINE BESYLATE 5 MG PO TABS
5.0000 mg | ORAL_TABLET | Freq: Every day | ORAL | Status: DC
  Administered 2013-03-01: 5 mg via ORAL
  Filled 2013-03-01: qty 1

## 2013-03-01 MED ORDER — NITROGLYCERIN IN D5W 200-5 MCG/ML-% IV SOLN
INTRAVENOUS | Status: AC
  Administered 2013-03-01: 10 ug/min via INTRAVENOUS
  Filled 2013-03-01: qty 250

## 2013-03-01 MED ORDER — SODIUM CHLORIDE 0.9 % IV SOLN: INTRAVENOUS | Status: DC

## 2013-03-01 MED ORDER — AMLODIPINE BESYLATE 5 MG PO TABS
5.0000 mg | ORAL_TABLET | ORAL | Status: DC
  Filled 2013-03-01: qty 1

## 2013-03-01 MED ORDER — METOPROLOL TARTRATE 1 MG/ML IV SOLN
INTRAVENOUS | Status: AC
  Filled 2013-03-01: qty 5

## 2013-03-01 MED ORDER — AMLODIPINE BESYLATE 10 MG PO TABS
10.0000 mg | ORAL_TABLET | Freq: Every day | ORAL | Status: DC
Start: 2013-03-02 — End: 2013-03-02
  Administered 2013-03-02: 10 mg via ORAL
  Filled 2013-03-01: qty 1

## 2013-03-01 MED ORDER — METOPROLOL TARTRATE 50 MG PO TABS
50.0000 mg | ORAL_TABLET | Freq: Two times a day (BID) | ORAL | Status: DC
  Administered 2013-03-01 – 2013-03-02 (×2): 50 mg via ORAL
  Filled 2013-03-01 (×4): qty 1

## 2013-03-01 MED ORDER — SODIUM CHLORIDE 0.9 % IV SOLN
INTRAVENOUS | Status: DC
  Administered 2013-03-01: 03:00:00 via INTRAVENOUS

## 2013-03-01 NOTE — Progress Notes (Addendum)
[facility] PROGRESS NOTE  Selena Meyer ZOX:[id number] DOB: [birth year] DOA: 02/28/2013 PCP: No PCP Per Patient  Assessment/Plan: Atypical chest discomfort  -This may be related to her recent URI as well as HTN urgency  -cycle troponin--neg x 3  -EKG is reassuring  -02/26/2013 chest x-ray was negative for infiltrates  Hypertensive urgency  -I have ordered an additional dose of labetalol 10 mg IV x1 in the emergency department  -Pt will be started on metoprolol tartrate as well as amlodipine  -Increase amlodipine to 10 mg. Increased metoprolol tartrate 50 mg twice a day -Urine drug screen--negative  -TSH--1.076  -hydralazine prn SBP>180  Intractable headache  -CT brain--no acute findings  -No focal neurologic deficit at this time  Proteinuria  -This is likely a result of the patient's uncontrolled hypertension  -no peripheral edema at this time to suggest nephrotic syndrome  Tobacco Abuse  -tobacco cessation discussed Facial edema -Patient felt that she had some swelling around her TMJ areas -no trismus or lip or tongue swelling noted on exam -shotty cervical LN noted on exam without gross edema -will keep patient overnight to watch for reaction ACEi given this am  Family Communication:   sister at beside Disposition Plan:   Home when medically stable       Procedures/Studies: Dg Chest 2 View  02/26/2013   CLINICAL DATA:  Cough and congestion.  Smoker.  EXAM: CHEST  2 VIEW  COMPARISON:  PA and lateral chest 12/29/2010.  FINDINGS: Heart size and mediastinal contours are within normal limits. Both lungs are clear. Visualized skeletal structures are unremarkable.  IMPRESSION: Negative exam.   Electronically Signed   By: Drusilla Kanner M.D.   On: 02/26/2013 14:47   Ct Head Wo Contrast  03/01/2013   CLINICAL DATA:  Hypertensive urgency.  Intractable headache.  EXAM: CT HEAD WITHOUT CONTRAST  TECHNIQUE: Contiguous axial images were obtained from the base of the skull  through the vertex without intravenous contrast.  COMPARISON:  CT head 02/26/2013  FINDINGS: Ventricle size is normal. Negative for intracranial hemorrhage. No acute infarct or mass.  MRI significantly more sensitive than CT for detection of hypertensive encephalopathy.  Mucosal edema in the paranasal sinuses.  No acute skull abnormality.  IMPRESSION: No significant intracranial abnormality. Consider MRI to evaluate for hypertensive encephalopathy.  Chronic sinusitis.   Electronically Signed   By: Marlan Palau M.D.   On: 03/01/2013 14:39   Ct Head Wo Contrast  02/26/2013   CLINICAL DATA:  Bronchitis.  Headache.  EXAM: CT HEAD WITHOUT CONTRAST  TECHNIQUE: Contiguous axial images were obtained from the base of the skull through the vertex without intravenous contrast.  COMPARISON:  None.  FINDINGS: Skull and Sinuses:Minimal scattered mucosal thickening in the paranasal sinuses. No visible sinus effusion.  Orbits: No acute abnormality.  Brain: No evidence of acute abnormality, such as acute infarction, hemorrhage, hydrocephalus, or mass lesion/mass effect.  IMPRESSION: Negative head CT.   Electronically Signed   By: Tiburcio Pea M.D.   On: 02/26/2013 14:55         Subjective:  Patient denies fevers, chills, chest discomfort, shortness of breath, nausea, vomiting, diarrhea, abdominal pain, dysuria, hematuria. She feels that she has some swelling in her jaw line area and TMJ area without any swelling of her lips or tongue. No shortness of breath. No difficulty swallowing.  Objective: Filed Vitals:   03/01/13 1200 03/01/13 1300 03/01/13 1611 03/01/13 1700  BP: 153/76 143/85 169/104 167/100  Pulse: 85 85 81 87  Temp:    97.7 F (36.5 C)  TempSrc:    Oral  Resp: 16 15 17 18   Height:      Weight:      SpO2: 100% 99% 99% 100%    Intake/Output Summary (Last 24 hours) at 03/01/13 1723 Last data filed at 03/01/13 1100  Gross per 24 hour  Intake 1016.95 ml  Output    525 ml  Net 491.95 ml    Weight change:  Exam:   General:  Pt is alert, follows commands appropriately, not in acute distress  HEENT: No icterus, No thrush, No neck mass, shotty anterior cervical lymphadenopathy-- no trismus, no lip swelling, no tongue swelling, stiffness without any edema or ulcerations; no significant edema noted on the patient's face   Cardiovascular: RRR, S1/S2, no rubs, no gallops  Respiratory: CTA bilaterally, no wheezing, no crackles, no rhonchi  Abdomen: Soft/+BS, non tender, non distended, no guarding  Extremities: No edema, No lymphangitis, No petechiae, No rashes, no synovitis  Data Reviewed: Basic Metabolic Panel:  Recent Labs Lab 02/26/13 1340 02/28/13 1308 02/28/13 1627 03/01/13 0423  NA 137 139 137 136*  K 4.1 3.9 4.3 4.2  CL 100 102 101 98  CO2 25  --  24 25  GLUCOSE 98 106* 90 129*  BUN 9 11 11 12   CREATININE 1.21* 1.20* 1.10 1.13*  CALCIUM 9.4  --  9.1 9.0   Liver Function Tests: No results found for this basename: AST, ALT, ALKPHOS, BILITOT, PROT, ALBUMIN,  in the last 168 hours No results found for this basename: LIPASE, AMYLASE,  in the last 168 hours No results found for this basename: AMMONIA,  in the last 168 hours CBC:  Recent Labs Lab 02/26/13 1340 02/28/13 1308 02/28/13 1627  WBC 10.6*  --  9.9  NEUTROABS 6.6  --  5.7  HGB 15.2* 17.0* 14.7  HCT 43.5 50.0* 42.0  MCV 86.7  --  86.6  PLT 297  --  286   Cardiac Enzymes:  Recent Labs Lab 02/26/13 1340 02/28/13 1627 02/28/13 2227 03/01/13 0423  TROPONINI <0.30 <0.30 <0.30 <0.30   BNP: No components found with this basename: POCBNP,  CBG: No results found for this basename: GLUCAP,  in the last 168 hours  Recent Results (from the past 240 hour(s))  RAPID STREP SCREEN     Status: None   Collection Time    02/26/13  1:50 PM      Result Value Ref Range Status   Streptococcus, Group A Screen (Direct) NEGATIVE  NEGATIVE Final   Comment: (NOTE)     A Rapid Antigen test may result  negative if the antigen level in the     sample is below the detection level of this test. The FDA has not     cleared this test as a stand-alone test therefore the rapid antigen     negative result has reflexed to a Group A Strep culture.  CULTURE, GROUP A STREP     Status: None   Collection Time    02/26/13  1:50 PM      Result Value Ref Range Status   Specimen Description THROAT   Final   Special Requests NONE   Final   Culture     Final   Value: No Beta Hemolytic Streptococci Isolated     [facility] at [facility]   Report Status 02/28/2013 FINAL   Final  MRSA PCR SCREENING     Status: None   Collection  Time    02/28/13  9:05 PM      Result Value Ref Range Status   MRSA by PCR NEGATIVE  NEGATIVE Final   Comment:            The GeneXpert MRSA Assay (FDA     approved for NASAL specimens     only), is one component of a     comprehensive MRSA colonization     surveillance program. It is not     intended to diagnose MRSA     infection nor to guide or     monitor treatment for     MRSA infections.     Scheduled Meds: . amLODipine  5 mg Oral Daily  . aspirin EC  325 mg Oral Daily  . enoxaparin (LOVENOX) injection  40 mg Subcutaneous Q24H  . metoprolol tartrate  25 mg Oral BID  . sodium chloride  3 mL Intravenous Q12H   Continuous Infusions: . sodium chloride Stopped (03/01/13 0800)  . sodium chloride       Razan Siler, DO  [facility] Pager [phone]  If 7PM-7AM, please contact night-coverage [email or web address] Password [facility] 03/01/2013, 5:23 PM   LOS: 1 day

## 2013-03-01 NOTE — Progress Notes (Signed)
Report to Ray, RN; pt transferred to floor with telemetry and pt belongings via wheelchair; will continue to monitor

## 2013-03-02 LAB — BASIC METABOLIC PANEL
BUN: 13 mg/dL (ref 6–23)
CALCIUM: 8.9 mg/dL (ref 8.4–10.5)
CHLORIDE: 101 meq/L (ref 96–112)
CO2: 26 mEq/L (ref 19–32)
Creatinine, Ser: 1.25 mg/dL — ABNORMAL HIGH (ref 0.50–1.10)
GFR calc Af Amer: 67 mL/min — ABNORMAL LOW (ref >90)
GFR calc non Af Amer: 58 mL/min — ABNORMAL LOW (ref >90)
Glucose, Bld: 103 mg/dL — ABNORMAL HIGH (ref 70–99)
Potassium: 4.3 mEq/L (ref 3.7–5.3)
SODIUM: 137 meq/L (ref 137–147)

## 2013-03-02 MED ORDER — SODIUM CHLORIDE 0.9 % IV SOLN
INTRAVENOUS | Status: DC
  Administered 2013-03-02: 09:00:00 via INTRAVENOUS

## 2013-03-02 MED ORDER — METOPROLOL TARTRATE 50 MG PO TABS: 50.0000 mg | ORAL_TABLET | Freq: Two times a day (BID) | ORAL | Status: DC

## 2013-03-02 MED ORDER — AMLODIPINE BESYLATE 10 MG PO TABS
10.0000 mg | ORAL_TABLET | Freq: Every day | ORAL | Status: DC
Start: 2013-03-02 — End: 2017-07-16

## 2013-03-02 NOTE — Progress Notes (Signed)
Utilization Review Completed.   Stanley Lyness, RN, BSN Nurse Case Manager  

## 2013-03-02 NOTE — ED Provider Notes (Signed)
Medical screening examination/treatment/procedure(s) were conducted as a shared visit with non-physician practitioner(s) and myself.  I personally evaluated the patient during the encounter.     Patient here with headache. Strong family hx of hypertension, stroke. Patient seen previously for HTN, instructed to f/u with PCP. Here today with headache. Concern for hypertensive urgency. Headaches have improved with BP control, I believed hypertension is direct cause of headaches. Labs with no end-organ damage. IV labetalol given. Patient admitted.   Dagmar HaitWilliam Sid Greener, MD 03/02/13 Marlyne [id number]

## 2013-03-02 NOTE — Progress Notes (Signed)
Physician Discharge Summary  Selena FortsLatoya Meyer NWG:[id number]:[id number] DOB: 11/26/1985 DOA: 02/28/2013  PCP: No PCP Per Patient  Admit date: 02/28/2013 Discharge date: 03/02/2013  Recommendations for Outpatient Follow-up:  1. Pt will need to follow up with PCP in 2 weeks post discharge 2. Please obtain BMP to evaluate electrolytes and kidney function 3. Please also check CBC to evaluate Hg and Hct levels   Discharge Diagnoses:  Active Problems:   Chest pain   Hypertensive urgency   Tobacco abuse   Proteinuria Atypical chest discomfort  -This may be related to her recent URI as well as HTN urgency  -cycle troponin--neg x 3  -EKG is reassuring  -02/26/2013 chest x-ray was negative for infiltrates  Hypertensive urgency  -I have ordered an additional dose of labetalol 10 mg IV x1 in the emergency department  -Pt will be started on metoprolol tartrate as well as amlodipine  -Increase amlodipine to 10 mg. Increased metoprolol tartrate 50 mg twice a day  -Urine drug screen--negative  -TSH--1.076  -hydralazine prn SBP>180   -Patient was instructed to follow up with primary care provider for further adjustments of her antihypertensive medications Intractable headache  -CT brain--no acute findings  -No focal neurologic deficit at this time  -Headache continues to improve with improvement of the patient's blood pressure Proteinuria  -This is likely a result of the patient's uncontrolled hypertension  -no peripheral edema at this time to suggest nephrotic syndrome  -Patient may ultimately benefit from starting an ACE inhibitor, however, the patient complained of facial swelling after starting one dose of lisinopril which was discontinued. Tobacco Abuse  -tobacco cessation discussed  Facial edema  -Patient felt that she had some swelling around her TMJ areas  -no trismus or lip or tongue swelling noted on exam  -shotty cervical LN noted on exam without gross edema  -will keep patient overnight to  watch for reaction ACEi given this am  -The patient did not experience any further facial swelling, edema, or tongue edema. -Facial edema  improved    Discharge Condition: Stable  Disposition:  discharge home  Diet:heart healthy Wt Readings from Last 3 Encounters:  02/28/13 94.4 kg (208 lb 1.8 oz)  08/25/12 86.183 kg (190 lb)    History of present illness:  28 year old female with a recent diagnosis of hypertension without any physician followup. [facility] for URI type symptoms with headache. The patient was diagnosed with hypertension at that time. Her blood pressure in the ED at that time was 182/102. Patient was sent home and told to follow up with her primary care physician. The patient was not given any prescriptions. The patient stated that her headache did not significantly improve. She has been taking Goody's powders in addition to acetaminophen over the past 2 days. The patient states that she T does not use any other NSAIDs nor did she use any illegal drugs nor any oral contraceptives. here has been no significant only for headache. In addition, the patient did complain of chest discomfort substernal in nature associated with coughing. The chest discomfort also occurs at rest. The patient has intermittent green sputum but denies any shortness breath, hemoptysis. She does have some subjective fevers and chills at home. Chest x-ray on 02/26/2013 was negative for any infiltrates. The patient went to urgent care today. Urinalysis showed proteinuria >300mg /dL without any significant pyuria. As a result, the patient was sent to the emergency department for further evaluation. In the ED, the  patient was noted to have blood pressure 207/116. The patient was given labetalol 10 mg IV. There is no significant improvement in the patient's headache or blood pressure. Because of the patient's chest discomfort and intractable evidence of urgency,  observation was requested. Initially, the patient's blood pressure was difficult to control. She was ultimately started on a nitroglycerin drip and moved to the step down unit. Her blood pressure improved. The patient was weaned off of the nitroglycerin drip and started on oral hypertensive medications which were titrated for her blood pressure. Her blood pressure remained stable on oral antihypertensive medications. The patient's headache gradually improved. CT of the brain was negative for any acute findings.   Discharge Exam: Filed Vitals:   03/02/13 0519  BP: 157/96  Pulse: 78  Temp: 97.9 F (36.6 C)  Resp: 18   Filed Vitals:   03/01/13 1700 03/01/13 1846 03/01/13 2050 03/02/13 0519  BP: 167/100 161/111 175/101 157/96  Pulse: 87 84 88 78  Temp: 97.7 F (36.5 C) 98.5 F (36.9 C) 98.1 F (36.7 C) 97.9 F (36.6 C)  TempSrc: Oral Oral Oral Oral  Resp: 18 18 18 18   Height:      Weight:      SpO2: 100% 100% 96% 95%   General: A&O x 3, NAD, pleasant, cooperative Cardiovascular: RRR, no rub, no gallop, no S3 Respiratory: CTAB, no wheeze, no rhonchi Abdomen:soft, nontender, nondistended, positive bowel sounds Extremities: No edema, No lymphangitis, no petechiae  Discharge Instructions      Discharge Orders   Future Orders Complete By Expires   Diet - low sodium heart healthy  As directed    Increase activity slowly  As directed        Medication List         acetaminophen 500 MG tablet  Commonly known as:  TYLENOL  Take 1,000 mg by mouth every 6 (six) hours as needed for mild pain.     amLODipine 10 MG tablet  Commonly known as:  NORVASC  Take 1 tablet (10 mg total) by mouth daily.     metoprolol 50 MG tablet  Commonly known as:  LOPRESSOR  Take 1 tablet (50 mg total) by mouth 2 (two) times daily.         The results of significant diagnostics from this hospitalization (including imaging, microbiology, ancillary and laboratory) are listed below for  reference.    Significant Diagnostic Studies: Dg Chest 2 View  02/26/2013   CLINICAL DATA:  Cough and congestion.  Smoker.  EXAM: CHEST  2 VIEW  COMPARISON:  PA and lateral chest 12/29/2010.  FINDINGS: Heart size and mediastinal contours are within normal limits. Both lungs are clear. Visualized skeletal structures are unremarkable.  IMPRESSION: Negative exam.   Electronically Signed   By: Drusilla Kannerhomas  Dalessio M.D.   On: 02/26/2013 14:47   Ct Head Wo Contrast  03/01/2013   CLINICAL DATA:  Hypertensive urgency.  Intractable headache.  EXAM: CT HEAD WITHOUT CONTRAST  TECHNIQUE: Contiguous axial images were obtained from the base of the skull through the vertex without intravenous contrast.  COMPARISON:  CT head 02/26/2013  FINDINGS: Ventricle size is normal. Negative for intracranial hemorrhage. No acute infarct or mass.  MRI significantly more sensitive than CT for detection of hypertensive encephalopathy.  Mucosal edema in the paranasal sinuses.  No acute skull abnormality.  IMPRESSION: No significant intracranial abnormality. Consider MRI to evaluate for hypertensive encephalopathy.  Chronic sinusitis.   Electronically Signed   By: Marlan Palauharles  Clark  M.D.   On: 03/01/2013 14:39   Ct Head Wo Contrast  02/26/2013   CLINICAL DATA:  Bronchitis.  Headache.  EXAM: CT HEAD WITHOUT CONTRAST  TECHNIQUE: Contiguous axial images were obtained from the base of the skull through the vertex without intravenous contrast.  COMPARISON:  None.  FINDINGS: Skull and Sinuses:Minimal scattered mucosal thickening in the paranasal sinuses. No visible sinus effusion.  Orbits: No acute abnormality.  Brain: No evidence of acute abnormality, such as acute infarction, hemorrhage, hydrocephalus, or mass lesion/mass effect.  IMPRESSION: Negative head CT.   Electronically Signed   By: Tiburcio Pea M.D.   On: 02/26/2013 14:55     Microbiology: Recent Results (from the past 240 hour(s))  RAPID STREP SCREEN     Status: None   Collection  Time    02/26/13  1:50 PM      Result Value Ref Range Status   Streptococcus, Group A Screen (Direct) NEGATIVE  NEGATIVE Final   Comment: (NOTE)     A Rapid Antigen test may result negative if the antigen level in the     sample is below the detection level of this test. The FDA has not     cleared this test as a stand-alone test therefore the rapid antigen     negative result has reflexed to a Group A Strep culture.  CULTURE, GROUP A STREP     Status: None   Collection Time    02/26/13  1:50 PM      Result Value Ref Range Status   Specimen Description THROAT   Final   Special Requests NONE   Final   Culture     Final   Value: No Beta Hemolytic Streptococci Isolated     Performed at [facility]   Report Status 02/28/2013 FINAL   Final  MRSA PCR SCREENING     Status: None   Collection Time    02/28/13  9:05 PM      Result Value Ref Range Status   MRSA by PCR NEGATIVE  NEGATIVE Final   Comment:            The GeneXpert MRSA Assay (FDA     approved for NASAL specimens     only), is one component of a     comprehensive MRSA colonization     surveillance program. It is not     intended to diagnose MRSA     infection nor to guide or     monitor treatment for     MRSA infections.     Labs: Basic Metabolic Panel:  Recent Labs Lab 02/26/13 1340 02/28/13 1308 02/28/13 1627 03/01/13 0423 03/02/13 0600  NA 137 139 137 136* 137  K 4.1 3.9 4.3 4.2 4.3  CL 100 102 101 98 101  CO2 25  --  24 25 26   GLUCOSE 98 106* 90 129* 103*  BUN 9 11 11 12 13   CREATININE 1.21* 1.20* 1.10 1.13* 1.25*  CALCIUM 9.4  --  9.1 9.0 8.9   Liver Function Tests: No results found for this basename: AST, ALT, ALKPHOS, BILITOT, PROT, ALBUMIN,  in the last 168 hours No results found for this basename: LIPASE, AMYLASE,  in the last 168 hours No results found for this basename: AMMONIA,  in the last 168 hours CBC:  Recent Labs Lab 02/26/13 1340 02/28/13 1308 02/28/13 1627  WBC 10.6*  --   9.9  NEUTROABS 6.6  --  5.7  HGB 15.2* 17.0* 14.7  HCT 43.5 50.0* 42.0  MCV 86.7  --  86.6  PLT 297  --  286   Cardiac Enzymes:  Recent Labs Lab 02/26/13 1340 02/28/13 1627 02/28/13 2227 03/01/13 0423  TROPONINI <0.30 <0.30 <0.30 <0.30   BNP: No components found with this basename: POCBNP,  CBG: No results found for this basename: GLUCAP,  in the last 168 hours  Time coordinating discharge:  Greater than 30 minutes  Signed:  Skylar Flynt, DO [facility] Pager: [phone] 03/02/2013, 8:21 AM

## 2013-03-02 NOTE — Discharge Summary (Signed)
Physician Discharge Summary  Althia FortsLatoya Simpson NWG:[id number] DOB: 11/26/1985 DOA: 02/28/2013  PCP: No PCP Per Patient  Admit date: 02/28/2013 Discharge date: 03/02/2013  Recommendations for Outpatient Follow-up:  1. Pt will need to follow up with PCP in 2 weeks post discharge 2. Please obtain BMP to evaluate electrolytes and kidney function 3. Please also check CBC to evaluate Hg and Hct levels   Discharge Diagnoses:  Active Problems:   Chest pain   Hypertensive urgency   Tobacco abuse   Proteinuria Atypical chest discomfort  -This may be related to her recent URI as well as HTN urgency  -cycle troponin--neg x 3  -EKG is reassuring  -02/26/2013 chest x-ray was negative for infiltrates  Hypertensive urgency  -I have ordered an additional dose of labetalol 10 mg IV x1 in the emergency department  -Pt will be started on metoprolol tartrate as well as amlodipine  -Increase amlodipine to 10 mg. Increased metoprolol tartrate 50 mg twice a day  -Urine drug screen--negative  -TSH--1.076  -hydralazine prn SBP>180   -Patient was instructed to follow up with primary care provider for further adjustments of her antihypertensive medications Intractable headache  -CT brain--no acute findings  -No focal neurologic deficit at this time  -Headache continues to improve with improvement of the patient's blood pressure Proteinuria  -This is likely a result of the patient's uncontrolled hypertension  -no peripheral edema at this time to suggest nephrotic syndrome  -Patient may ultimately benefit from starting an ACE inhibitor, however, the patient complained of facial swelling after starting one dose of lisinopril which was discontinued. Tobacco Abuse  -tobacco cessation discussed  Facial edema  -Patient felt that she had some swelling around her TMJ areas  -no trismus or lip or tongue swelling noted on exam  -shotty cervical LN noted on exam without gross edema  -will keep patient overnight to  watch for reaction ACEi given this am  -The patient did not experience any further facial swelling, edema, or tongue edema. -Facial edema  improved    Discharge Condition: Stable  Disposition:  discharge home  Diet:heart healthy Wt Readings from Last 3 Encounters:  02/28/13 94.4 kg (208 lb 1.8 oz)  08/25/12 86.183 kg (190 lb)    History of present illness:  28 year old female with a recent diagnosis of hypertension without any physician followup. [facility]/2013 for URI type symptoms with headache. The patient was diagnosed with hypertension at that time. Her blood pressure in the ED at that time was 182/102. Patient was sent home and told to follow up with her primary care physician. The patient was not given any prescriptions. The patient stated that her headache did not significantly improve. She has been taking Goody's powders in addition to acetaminophen over the past 2 days. The patient states that she T does not use any other NSAIDs nor did she use any illegal drugs nor any oral contraceptives. here has been no significant only for headache. In addition, the patient did complain of chest discomfort substernal in nature associated with coughing. The chest discomfort also occurs at rest. The patient has intermittent green sputum but denies any shortness breath, hemoptysis. She does have some subjective fevers and chills at home. Chest x-ray on 02/26/2013 was negative for any infiltrates. The patient went to urgent care today. Urinalysis showed proteinuria >300mg /dL without any significant pyuria. As a result, the patient was sent to the emergency department for further evaluation. In the ED, the  patient was noted to have blood pressure 207/116. The patient was given labetalol 10 mg IV. There is no significant improvement in the patient's headache or blood pressure. Because of the patient's chest discomfort and intractable evidence of urgency,  observation was requested. Initially, the patient's blood pressure was difficult to control. She was ultimately started on a nitroglycerin drip and moved to the step down unit. Her blood pressure improved. The patient was weaned off of the nitroglycerin drip and started on oral hypertensive medications which were titrated for her blood pressure. Her blood pressure remained stable on oral antihypertensive medications. The patient's headache gradually improved. CT of the brain was negative for any acute findings.   Discharge Exam: Filed Vitals:   03/02/13 0519  BP: 157/96  Pulse: 78  Temp: 97.9 F (36.6 C)  Resp: 18   Filed Vitals:   03/01/13 1700 03/01/13 1846 03/01/13 2050 03/02/13 0519  BP: 167/100 161/111 175/101 157/96  Pulse: 87 84 88 78  Temp: 97.7 F (36.5 C) 98.5 F (36.9 C) 98.1 F (36.7 C) 97.9 F (36.6 C)  TempSrc: Oral Oral Oral Oral  Resp: 18 18 18 18   Height:      Weight:      SpO2: 100% 100% 96% 95%   General: A&O x 3, NAD, pleasant, cooperative Cardiovascular: RRR, no rub, no gallop, no S3 Respiratory: CTAB, no wheeze, no rhonchi Abdomen:soft, nontender, nondistended, positive bowel sounds Extremities: No edema, No lymphangitis, no petechiae  Discharge Instructions      Discharge Orders   Future Orders Complete By Expires   Diet - low sodium heart healthy  As directed    Increase activity slowly  As directed        Medication List         acetaminophen 500 MG tablet  Commonly known as:  TYLENOL  Take 1,000 mg by mouth every 6 (six) hours as needed for mild pain.     amLODipine 10 MG tablet  Commonly known as:  NORVASC  Take 1 tablet (10 mg total) by mouth daily.     metoprolol 50 MG tablet  Commonly known as:  LOPRESSOR  Take 1 tablet (50 mg total) by mouth 2 (two) times daily.         The results of significant diagnostics from this hospitalization (including imaging, microbiology, ancillary and laboratory) are listed below for  reference.    Significant Diagnostic Studies: Dg Chest 2 View  02/26/2013   CLINICAL DATA:  Cough and congestion.  Smoker.  EXAM: CHEST  2 VIEW  COMPARISON:  PA and lateral chest 12/29/2010.  FINDINGS: Heart size and mediastinal contours are within normal limits. Both lungs are clear. Visualized skeletal structures are unremarkable.  IMPRESSION: Negative exam.   Electronically Signed   By: Drusilla Kannerhomas  Dalessio M.D.   On: 02/26/2013 14:47   Ct Head Wo Contrast  03/01/2013   CLINICAL DATA:  Hypertensive urgency.  Intractable headache.  EXAM: CT HEAD WITHOUT CONTRAST  TECHNIQUE: Contiguous axial images were obtained from the base of the skull through the vertex without intravenous contrast.  COMPARISON:  CT head 02/26/2013  FINDINGS: Ventricle size is normal. Negative for intracranial hemorrhage. No acute infarct or mass.  MRI significantly more sensitive than CT for detection of hypertensive encephalopathy.  Mucosal edema in the paranasal sinuses.  No acute skull abnormality.  IMPRESSION: No significant intracranial abnormality. Consider MRI to evaluate for hypertensive encephalopathy.  Chronic sinusitis.   Electronically Signed   By: Marlan Palauharles  Clark  M.D.   On: 03/01/2013 14:39   Ct Head Wo Contrast  02/26/2013   CLINICAL DATA:  Bronchitis.  Headache.  EXAM: CT HEAD WITHOUT CONTRAST  TECHNIQUE: Contiguous axial images were obtained from the base of the skull through the vertex without intravenous contrast.  COMPARISON:  None.  FINDINGS: Skull and Sinuses:Minimal scattered mucosal thickening in the paranasal sinuses. No visible sinus effusion.  Orbits: No acute abnormality.  Brain: No evidence of acute abnormality, such as acute infarction, hemorrhage, hydrocephalus, or mass lesion/mass effect.  IMPRESSION: Negative head CT.   Electronically Signed   By: Tiburcio Pea M.D.   On: 02/26/2013 14:55     Microbiology: Recent Results (from the past 240 hour(s))  RAPID STREP SCREEN     Status: None   Collection  Time    02/26/13  1:50 PM      Result Value Ref Range Status   Streptococcus, Group A Screen (Direct) NEGATIVE  NEGATIVE Final   Comment: (NOTE)     A Rapid Antigen test may result negative if the antigen level in the     sample is below the detection level of this test. The FDA has not     cleared this test as a stand-alone test therefore the rapid antigen     negative result has reflexed to a Group A Strep culture.  CULTURE, GROUP A STREP     Status: None   Collection Time    02/26/13  1:50 PM      Result Value Ref Range Status   Specimen Description THROAT   Final   Special Requests NONE   Final   Culture     Final   Value: No Beta Hemolytic Streptococci Isolated     Performed at [facility]   Report Status 02/28/2013 FINAL   Final  MRSA PCR SCREENING     Status: None   Collection Time    02/28/13  9:05 PM      Result Value Ref Range Status   MRSA by PCR NEGATIVE  NEGATIVE Final   Comment:            The GeneXpert MRSA Assay (FDA     approved for NASAL specimens     only), is one component of a     comprehensive MRSA colonization     surveillance program. It is not     intended to diagnose MRSA     infection nor to guide or     monitor treatment for     MRSA infections.     Labs: Basic Metabolic Panel:  Recent Labs Lab 02/26/13 1340 02/28/13 1308 02/28/13 1627 03/01/13 0423 03/02/13 0600  NA 137 139 137 136* 137  K 4.1 3.9 4.3 4.2 4.3  CL 100 102 101 98 101  CO2 25  --  24 25 26   GLUCOSE 98 106* 90 129* 103*  BUN 9 11 11 12 13   CREATININE 1.21* 1.20* 1.10 1.13* 1.25*  CALCIUM 9.4  --  9.1 9.0 8.9   Liver Function Tests: No results found for this basename: AST, ALT, ALKPHOS, BILITOT, PROT, ALBUMIN,  in the last 168 hours No results found for this basename: LIPASE, AMYLASE,  in the last 168 hours No results found for this basename: AMMONIA,  in the last 168 hours CBC:  Recent Labs Lab 02/26/13 1340 02/28/13 1308 02/28/13 1627  WBC 10.6*  --   9.9  NEUTROABS 6.6  --  5.7  HGB 15.2* 17.0* 14.7  HCT 43.5 50.0* 42.0  MCV 86.7  --  86.6  PLT 297  --  286   Cardiac Enzymes:  Recent Labs Lab 02/26/13 1340 02/28/13 1627 02/28/13 2227 03/01/13 0423  TROPONINI <0.30 <0.30 <0.30 <0.30   BNP: No components found with this basename: POCBNP,  CBG: No results found for this basename: GLUCAP,  in the last 168 hours  Time coordinating discharge:  Greater than 30 minutes  Signed:  Hardin Hardenbrook, DO [facility] Pager: [phone] 03/02/2013, 8:29 AM

## 2013-03-03 NOTE — Care Management (Signed)
03-03-13 0940 Referral: Pt needs a PCP; will need ongoing medical management after discharge.- Received, CM did call the pt and no answer. Unable to leave a voice mail on this number. Pt has medicaid and should have an assigned MD.  Pt did call at 0944 and CM was able to [facility] the [facility] No further needs from CM at this time. Gala LewandowskyGraves-Bigelow, Latavius Capizzi Kaye, RN,BSN [phone]

## 2013-06-17 ENCOUNTER — Telehealth (HOSPITAL_COMMUNITY): Payer: Self-pay | Admitting: Cardiology

## 2013-06-17 NOTE — Telephone Encounter (Signed)
Opened in error

## 2015-03-30 DIAGNOSIS — Z9189 Other specified personal risk factors, not elsewhere classified: Secondary | ICD-10-CM | POA: Insufficient documentation

## 2015-03-30 DIAGNOSIS — N912 Amenorrhea, unspecified: Secondary | ICD-10-CM | POA: Insufficient documentation

## 2015-08-23 ENCOUNTER — Encounter (HOSPITAL_COMMUNITY): Payer: Self-pay | Admitting: Emergency Medicine

## 2015-08-23 ENCOUNTER — Emergency Department (HOSPITAL_COMMUNITY)
Admission: EM | Admit: 2015-08-23 | Discharge: 2015-08-23 | Disposition: A | Discharge status: Home / Self Care | Payer: Medicaid Other | Attending: Emergency Medicine | Admitting: Emergency Medicine

## 2015-08-23 DIAGNOSIS — I160 Hypertensive urgency: Secondary | ICD-10-CM | POA: Insufficient documentation

## 2015-08-23 DIAGNOSIS — M5431 Sciatica, right side: Secondary | ICD-10-CM

## 2015-08-23 DIAGNOSIS — M5432 Sciatica, left side: Secondary | ICD-10-CM

## 2015-08-23 DIAGNOSIS — F17210 Nicotine dependence, cigarettes, uncomplicated: Secondary | ICD-10-CM | POA: Insufficient documentation

## 2015-08-23 DIAGNOSIS — M5441 Lumbago with sciatica, right side: Secondary | ICD-10-CM | POA: Insufficient documentation

## 2015-08-23 DIAGNOSIS — M5442 Lumbago with sciatica, left side: Secondary | ICD-10-CM | POA: Insufficient documentation

## 2015-08-23 DIAGNOSIS — Z79899 Other long term (current) drug therapy: Secondary | ICD-10-CM | POA: Insufficient documentation

## 2015-08-23 MED ORDER — PREDNISONE 20 MG PO TABS: 40.0000 mg | ORAL_TABLET | Freq: Every day | ORAL | 0 refills | Status: DC

## 2015-08-23 MED ORDER — TRAMADOL HCL 50 MG PO TABS
50.0000 mg | ORAL_TABLET | Freq: Once | ORAL | Status: AC
  Administered 2015-08-23: 50 mg via ORAL
  Filled 2015-08-23: qty 1

## 2015-08-23 MED ORDER — TRAMADOL HCL 50 MG PO TABS: 50.0000 mg | ORAL_TABLET | Freq: Four times a day (QID) | ORAL | 0 refills | Status: DC | PRN

## 2015-08-23 MED ORDER — PREDNISONE 20 MG PO TABS
60.0000 mg | ORAL_TABLET | Freq: Once | ORAL | Status: AC
  Administered 2015-08-23: 60 mg via ORAL
  Filled 2015-08-23: qty 3

## 2015-08-23 NOTE — ED Triage Notes (Signed)
Pt transported from home by EMS c/o low back pain onset last evening @2000 . Denies urinary s/s to EMS.

## 2015-08-23 NOTE — ED Provider Notes (Signed)
[facility]-EMERGENCY DEPT Provider Note   CSN: [id number] Arrival date & time: 08/23/15  [id number]     History   Chief Complaint Chief Complaint  Patient presents with  . Back Pain    HPI Selena Meyer is a 30 y.o. female.  HPI   Patient is a 30 year old female with history of hypertension, she presents emergency department for low back pain with radiation down both her legs which began last night. Onset was gradual possibly 12 hours ago without any inciting injury or particular instance of straining. She states that her pain is located in her low back and radiates down both of her legs but is somewhat worse on the right side. Pain is worse when sitting upright and slightly improved with lying on her left side, rated 10/10 at worse. Pain is described as shooting and stabbing with associated tingling and numbness only when sitting upright for long periods of time. She denies any weakness or numbness currently. She denies any urinary symptoms, no flank pain, no urinary incontinence. She denies any fever, chills, sweats no IV drug use in the past, denies any prolonged steroid use and denies personal cancer history.  She reports an allergy to all NSAIDs and has not used any over-the-counter medications prior to arrival in the ER.  Past Medical History:  Diagnosis Date  . Bronchitis   . Hypertension   . Irregular menstrual cycle     Patient Active Problem List   Diagnosis Date Noted  . Hypertensive urgency 02/28/2013  . Tobacco abuse 02/28/2013  . Proteinuria 02/28/2013  . Chest pain 02/26/2013    History reviewed. No pertinent surgical history.  OB History    No data available       Home Medications    Prior to Admission medications   Medication Sig Start Date End Date Taking? Authorizing Provider  acetaminophen (TYLENOL) 500 MG tablet Take 1,000 mg by mouth every 6 (six) hours as needed for mild pain.     Historical Provider, MD  amLODipine (NORVASC) 10 MG tablet Take 1 tablet (10  mg total) by mouth daily. 03/02/13   Catarina Hartshornavid Tat, MD  metoprolol (LOPRESSOR) 50 MG tablet Take 1 tablet (50 mg total) by mouth 2 (two) times daily. 03/02/13   Catarina Hartshornavid Tat, MD  predniSONE (DELTASONE) 20 MG tablet Take 2 tablets (40 mg total) by mouth daily. 08/23/15   Danelle BerryLeisa Zakyria Metzinger, PA-C  traMADol (ULTRAM) 50 MG tablet Take 1 tablet (50 mg total) by mouth every 6 (six) hours as needed. 08/23/15   Danelle BerryLeisa Cheron Coryell, PA-C    Family History No family history on file.  Social History Social History  Substance Use Topics  . Smoking status: Current Every Day Smoker    Packs/day: 0.25    Years: 5.00    Types: Cigarettes  . Smokeless tobacco: Never Used  . Alcohol use Yes     Comment: "occasional use; social drinker"     Allergies   Ibuprofen   Review of Systems Review of Systems  All other systems reviewed and are negative.    Physical Exam Updated Vital Signs BP 116/69 (BP Location: Right Arm)   Pulse 73   Temp 98.8 F (37.1 C) (Oral)   Resp 18   Ht 5\' 5"  (1.651 m)   Wt 93.4 kg   LMP 08/16/2015   SpO2 98%   BMI 34.28 kg/m   Physical Exam  Constitutional: She is oriented to person, place, and time. She appears well-developed and well-nourished. No distress.  HENT:  Head: Normocephalic and atraumatic.  Nose: Nose normal.  Eyes: Conjunctivae and EOM are normal. Pupils are equal, round, and reactive to light. Right eye exhibits no discharge. Left eye exhibits no discharge.  Neck: Trachea normal, normal range of motion, full passive range of motion without pain and phonation normal. Neck supple. No spinous process tenderness and no muscular tenderness present. No neck rigidity. Normal range of motion present.  Cardiovascular: Normal rate, regular rhythm and intact distal pulses.  Exam reveals no gallop.   No murmur heard. Pulmonary/Chest: Effort normal and breath sounds normal. No stridor. No respiratory distress. She has no decreased breath sounds. She has no wheezes. She has no rhonchi.  She has no rales.  Abdominal: Soft. Normal appearance and bowel sounds are normal. There is no rigidity, no rebound, no guarding and no CVA tenderness.  Musculoskeletal: Normal range of motion. She exhibits tenderness.       Cervical back: Normal.       Thoracic back: Normal.       Lumbar back: She exhibits tenderness. She exhibits normal range of motion, no bony tenderness, no swelling, no edema and no spasm.       Back:  Neurological: She is alert and oriented to person, place, and time. She has normal reflexes. She exhibits normal muscle tone. Coordination normal.  Speech is clear and goal oriented, follows commands Major Cranial nerves without deficit, no facial droop Normal strength in upper and lower extremities bilaterally including dorsiflexion and plantar flexion, strong and equal grip strength Sensation normal to light and sharp touch Moves extremities without ataxia, coordination intact Antalgic gait   Skin: Skin is warm and dry. Capillary refill takes less than 2 seconds. She is not diaphoretic. No erythema.  Psychiatric: She has a normal mood and affect. Her behavior is normal. Judgment and thought content normal.  Nursing note and vitals reviewed.    ED Treatments / Results  Labs (all labs ordered are listed, but only abnormal results are displayed) Labs Reviewed - No data to display  EKG  EKG Interpretation None       Radiology No results found.  Procedures Procedures (including critical care time)  Medications Ordered in ED Medications  predniSONE (DELTASONE) tablet 60 mg (60 mg Oral Given 08/23/15 0905)  traMADol (ULTRAM) tablet 50 mg (50 mg Oral Given 08/23/15 0905)     Initial Impression / Assessment and Plan / ED Course  I have reviewed the triage vital signs and the nursing notes.  Pertinent labs & imaging results that were available during my care of the patient were reviewed by me and considered in my medical decision making (see chart for  details).  Clinical Course    30 y/o pt with low back pain with radiation down legs.  No neurological deficits and normal neuro exam.  Patient can walk but states is painful.  No loss of bowel or bladder control.  No concern for cauda equina.  No fever, night sweats, weight loss, h/o cancer, IVDU.  RICE protocol and pain medicine indicated and discussed with patient. She will f/up with her PCP this week.  Pt cannot take NSAIDs so discussed and agreed on steroid burst for inflammation.   Final Clinical Impressions(s) / ED Diagnoses   Final diagnoses:  Bilateral sciatica    New Prescriptions Discharge Medication List as of 08/23/2015  9:13 AM    START taking these medications   Details  predniSONE (DELTASONE) 20 MG tablet Take 2 tablets (40 mg total) by  mouth daily., Starting Mon 08/23/2015, Print    traMADol (ULTRAM) 50 MG tablet Take 1 tablet (50 mg total) by mouth every 6 (six) hours as needed., Starting Mon 08/23/2015, Print         Danelle BerryLeisa Donnalynn Wheeless, PA-C 08/24/15 1003    Melene Planan Floyd, DO 08/24/15 1528

## 2015-08-23 NOTE — ED Notes (Signed)
Discharge instructions, follow up are, and rx x2 reviewed with patient. Patient verbalized understanding.

## 2015-11-09 DIAGNOSIS — E282 Polycystic ovarian syndrome: Secondary | ICD-10-CM

## 2015-11-09 HISTORY — DX: Polycystic ovarian syndrome: E28.2

## 2016-06-04 LAB — BASIC METABOLIC PANEL: CREATININE: 1.1 (ref 0.5–1.1)

## 2016-08-18 ENCOUNTER — Emergency Department (HOSPITAL_COMMUNITY)
Admission: EM | Admit: 2016-08-18 | Discharge: 2016-08-18 | Disposition: A | Discharge status: Home / Self Care | Payer: 59 | Attending: Emergency Medicine | Admitting: Emergency Medicine

## 2016-08-18 DIAGNOSIS — Z79899 Other long term (current) drug therapy: Secondary | ICD-10-CM | POA: Diagnosis not present

## 2016-08-18 DIAGNOSIS — S161XXA Strain of muscle, fascia and tendon at neck level, initial encounter: Secondary | ICD-10-CM | POA: Insufficient documentation

## 2016-08-18 DIAGNOSIS — S39012A Strain of muscle, fascia and tendon of lower back, initial encounter: Secondary | ICD-10-CM | POA: Insufficient documentation

## 2016-08-18 DIAGNOSIS — Y999 Unspecified external cause status: Secondary | ICD-10-CM | POA: Diagnosis not present

## 2016-08-18 DIAGNOSIS — I10 Essential (primary) hypertension: Secondary | ICD-10-CM | POA: Diagnosis not present

## 2016-08-18 DIAGNOSIS — Y92410 Unspecified street and highway as the place of occurrence of the external cause: Secondary | ICD-10-CM | POA: Insufficient documentation

## 2016-08-18 DIAGNOSIS — F17210 Nicotine dependence, cigarettes, uncomplicated: Secondary | ICD-10-CM | POA: Diagnosis not present

## 2016-08-18 DIAGNOSIS — Y93I9 Activity, other involving external motion: Secondary | ICD-10-CM | POA: Diagnosis not present

## 2016-08-18 DIAGNOSIS — S199XXA Unspecified injury of neck, initial encounter: Secondary | ICD-10-CM | POA: Diagnosis present

## 2016-08-18 LAB — POC URINE PREG, ED: Preg Test, Ur: NEGATIVE

## 2016-08-18 MED ORDER — METHOCARBAMOL 500 MG PO TABS: 1000.0000 mg | ORAL_TABLET | Freq: Three times a day (TID) | ORAL | 0 refills | Status: DC | PRN

## 2016-08-18 MED ORDER — ACETAMINOPHEN 500 MG PO TABS: 1000.0000 mg | ORAL_TABLET | Freq: Four times a day (QID) | ORAL | 0 refills | Status: DC | PRN
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
## 2016-08-24 DIAGNOSIS — S3992XA Unspecified injury of lower back, initial encounter: Secondary | ICD-10-CM | POA: Diagnosis not present

## 2016-08-24 DIAGNOSIS — M545 Low back pain: Secondary | ICD-10-CM | POA: Diagnosis not present

## 2016-10-05 DIAGNOSIS — N390 Urinary tract infection, site not specified: Secondary | ICD-10-CM | POA: Diagnosis not present

## 2016-10-05 DIAGNOSIS — I10 Essential (primary) hypertension: Secondary | ICD-10-CM | POA: Diagnosis not present

## 2016-10-05 DIAGNOSIS — Z0000 Encounter for general adult medical examination without abnormal findings: Secondary | ICD-10-CM | POA: Diagnosis not present

## 2016-10-05 LAB — BASIC METABOLIC PANEL: BUN: 15 (ref 4–21)

## 2016-12-31 DIAGNOSIS — Z888 Allergy status to other drugs, medicaments and biological substances status: Secondary | ICD-10-CM | POA: Diagnosis not present

## 2016-12-31 DIAGNOSIS — N132 Hydronephrosis with renal and ureteral calculous obstruction: Secondary | ICD-10-CM | POA: Diagnosis not present

## 2016-12-31 DIAGNOSIS — N2882 Megaloureter: Secondary | ICD-10-CM | POA: Diagnosis not present

## 2016-12-31 DIAGNOSIS — R1084 Generalized abdominal pain: Secondary | ICD-10-CM | POA: Diagnosis not present

## 2016-12-31 DIAGNOSIS — N23 Unspecified renal colic: Secondary | ICD-10-CM | POA: Diagnosis not present

## 2016-12-31 DIAGNOSIS — N201 Calculus of ureter: Secondary | ICD-10-CM | POA: Diagnosis not present

## 2017-04-26 ENCOUNTER — Encounter: Payer: Self-pay | Admitting: Osteopathic Medicine

## 2017-04-26 ENCOUNTER — Ambulatory Visit (INDEPENDENT_AMBULATORY_CARE_PROVIDER_SITE_OTHER): Payer: 59 | Admitting: Osteopathic Medicine

## 2017-04-26 VITALS — BP 188/151 | HR 108 | Temp 99.4°F | Ht 64.96 in | Wt 194.6 lb

## 2017-04-26 DIAGNOSIS — R809 Proteinuria, unspecified: Secondary | ICD-10-CM | POA: Diagnosis not present

## 2017-04-26 DIAGNOSIS — H6122 Impacted cerumen, left ear: Secondary | ICD-10-CM

## 2017-04-26 DIAGNOSIS — I10 Essential (primary) hypertension: Secondary | ICD-10-CM

## 2017-04-26 MED ORDER — METOPROLOL SUCCINATE ER 50 MG PO TB24: 50.0000 mg | ORAL_TABLET | Freq: Every day | ORAL | 1 refills | Status: DC

## 2017-04-26 MED ORDER — HYDROCHLOROTHIAZIDE 25 MG PO TABS
25.0000 mg | ORAL_TABLET | Freq: Every day | ORAL | 1 refills | Status: DC
Start: 2017-04-26 — End: 2017-07-31

## 2017-04-26 NOTE — Patient Instructions (Signed)
Debrox ear drops Mineral Oil drops  To help soften wax use 1-2 times per day

## 2017-04-26 NOTE — Progress Notes (Signed)
HPI: Selena Meyer is a 32 y.o. female who  has a past medical history of Bronchitis, Hypertension, and Irregular menstrual cycle.  [facility], 04/26/17,  for chief complaint of: Establish care HTN  HTN: hospitalized for chest pain and HTN urgency in 02/2013, records reviewed. ACS w/u neg. Associated chest pain, headache. Complications of proteinuria, tobacco dependence were noted. Symptoms resolved w/ better BP control.   Previous PCP declined BP med refill due to hadn't been seen in some time. She has been out of meds x one month. Last BP on file 163/103 on ER visit.BP today was 204/129 on intake. Rechecked 188/151 here.  No headache/vision change. No chest pain or SOB. No records available so not sure of previous workup for secondary HTN acuses in a person w/ high BP so young. (+)FH HTN.   Last labs: ER visit [facility] 12/2016 for kidney stone. Elev creatinine to 1.28, Glc was 163 unknown if fasting. DM2 and HTN runs in maternal side of the family.  Reports diminished hearing and pressure feeling in L ear/      Past medical, surgical, social and family history reviewed:  Patient Active Problem List   Diagnosis Date Noted  . Hypertensive urgency 02/28/2013  . Tobacco abuse 02/28/2013  . Proteinuria 02/28/2013  . Chest pain 02/26/2013    No past surgical history on file.  Social History   Tobacco Use  . Smoking status: Current Every Day Smoker    Packs/day: 0.25    Years: 5.00    Pack years: 1.25    Types: Cigarettes  . Smokeless tobacco: Never Used  Substance Use Topics  . Alcohol use: Yes    Comment: "occasional use; social drinker"    No family history on file.   Current medication list and allergy/intolerance information reviewed:    No current meds  Allergies  Allergen Reactions  . Ibuprofen Hives      Review of Systems:  Constitutional:  No  fever, no chills, No recent illness, No unintentional  weight changes. No significant fatigue.   HEENT: No  headache, no vision change, +hearing change, No sore throat, No  sinus pressure  Cardiac: No  chest pain, No  pressure, No palpitations, No  Orthopnea  Respiratory:  No  shortness of breath. No  Cough  Gastrointestinal: No  abdominal pain, No  nausea, No  vomiting,  No  blood in stool, No  diarrhea, No  constipation   Musculoskeletal: No new myalgia/arthralgia  Skin: No  Rash, No other wounds/concerning lesions  Endocrine: No cold intolerance,  No heat intolerance. No polyuria/polydipsia/polyphagia   Neurologic: No  weakness, No  dizziness  Psychiatric: No  concerns with depression, No  concerns with anxiety, No sleep problems, No mood problems  Exam:  BP (!) 204/129 (BP Location: Left Arm, Patient Position: Sitting, Cuff Size: Large)   Pulse (!) 108   Temp 99.4 F (37.4 C) (Oral)   Ht 5' 4.96" (1.65 m)   Wt 194 lb 9.6 oz (88.3 kg)   BMI 32.42 kg/m   Constitutional: VS see above. General Appearance: alert, well-developed, well-nourished, NAD  Eyes: Normal lids and conjunctive, non-icteric sclera  Ears, Nose, Mouth, Throat: MMM, Normal external inspection ears/nares/mouth/lips/gums. TM normal on R, limited visibility on L d/t cerumen, on clearance of external canal there appears to be some cerumen on the TM on lower portion. Pharynx/tonsils no erythema, no exudate. Nasal mucosa normal.   Neck: No masses, trachea midline.  No thyroid enlargement. No tenderness/mass appreciated. No lymphadenopathy  Respiratory: Normal respiratory effort. no wheeze, no rhonchi, no rales  Cardiovascular: S1/S2 normal, no murmur, no rub/gallop auscultated. RRR. No lower extremity edema. No abdominal aortic bruit.  Gastrointestinal: Nontender, no masses. No hepatomegaly, no splenomegaly.  Musculoskeletal: Gait normal.   Neurological: Normal balance/coordination. No tremor. No cranial nerve deficit on limited exam. Motor and sensation intact  and symmetric. Cerebellar reflexes intact.   Skin: warm, dry, intact. No rash/ulcer.    Psychiatric: Normal judgment/insight. Normal mood and affect. Oriented x3.      ASSESSMENT/PLAN:   Uncontrolled hypertension - Plan: metoprolol succinate (TOPROL-XL) 50 MG 24 hr tablet, hydrochlorothiazide (HYDRODIURIL) 25 MG tablet, COMPLETE METABOLIC PANEL WITH GFR, TSH, CBC, Cortisol, free, Serum, Aldosterone + renin activity w/ ratio, Urinalysis, Routine w reflex microscopic  Proteinuria, unspecified type - Plan: Urinalysis, Routine w reflex microscopic  Cerumen debris on tympanic membrane of left ear - Plan: Ear Lavage    Patient Instructions  Debrox ear drops Mineral Oil drops  To help soften wax use 1-2 times per day    Visit summary with medication list and pertinent instructions was printed for patient to review. All questions at time of visit were answered - patient instructed to contact office with any additional concerns. ER/RTC precautions were reviewed with the patient.   Follow-up plan: Return in about 1 week (around 05/03/2017) for recheck BP and go over labs, recheck ear .    Please note: voice recognition software was used to produce this document, and typos may escape review. Please contact Dr. Lyn Hollingshead for any needed clarifications.

## 2017-05-01 LAB — CBC
HCT: 43.6 % (ref 35.0–45.0)
Hemoglobin: 15 g/dL (ref 11.7–15.5)
MCH: 30 pg (ref 27.0–33.0)
MCHC: 34.4 g/dL (ref 32.0–36.0)
MCV: 87.2 fL (ref 80.0–100.0)
MPV: 8.9 fL (ref 7.5–12.5)
PLATELETS: 376 10*3/uL (ref 140–400)
RBC: 5 10*6/uL (ref 3.80–5.10)
RDW: 13.4 % (ref 11.0–15.0)
WBC: 10.9 10*3/uL — ABNORMAL HIGH (ref 3.8–10.8)

## 2017-05-01 LAB — URINALYSIS, ROUTINE W REFLEX MICROSCOPIC
Bilirubin Urine: NEGATIVE
GLUCOSE, UA: NEGATIVE
HGB URINE DIPSTICK: NEGATIVE
HYALINE CAST: NONE SEEN /LPF
Ketones, ur: NEGATIVE
Leukocytes, UA: NEGATIVE
Nitrite: NEGATIVE
Specific Gravity, Urine: 1.019 (ref 1.001–1.03)
pH: 5.5 (ref 5.0–8.0)

## 2017-05-01 LAB — COMPLETE METABOLIC PANEL WITH GFR
AG RATIO: 1.2 (calc) (ref 1.0–2.5)
ALBUMIN MSPROF: 3.9 g/dL (ref 3.6–5.1)
ALKALINE PHOSPHATASE (APISO): 96 U/L (ref 33–115)
ALT: 17 U/L (ref 6–29)
AST: 17 U/L (ref 10–30)
BUN / CREAT RATIO: 12 (calc) (ref 6–22)
BUN: 14 mg/dL (ref 7–25)
CHLORIDE: 104 mmol/L (ref 98–110)
CO2: 25 mmol/L (ref 20–32)
Calcium: 9.5 mg/dL (ref 8.6–10.2)
Creat: 1.21 mg/dL — ABNORMAL HIGH (ref 0.50–1.10)
GFR, Est African American: 68 mL/min/{1.73_m2} (ref >=60)
GFR, Est Non African American: 59 mL/min/{1.73_m2} — ABNORMAL LOW (ref >=60)
Globulin: 3.2 g/dL (calc) (ref 1.9–3.7)
Glucose, Bld: 99 mg/dL (ref 65–99)
POTASSIUM: 4.2 mmol/L (ref 3.5–5.3)
Sodium: 137 mmol/L (ref 135–146)
Total Bilirubin: 0.2 mg/dL (ref 0.2–1.2)
Total Protein: 7.1 g/dL (ref 6.1–8.1)

## 2017-05-01 LAB — ALDOSTERONE + RENIN ACTIVITY W/ RATIO
ALDO / PRA RATIO: 8.8 ratio (ref 0.9–28.9)
Aldosterone: 8 ng/dL
RENIN ACTIVITY: 0.91 ng/mL/h (ref 0.25–5.82)

## 2017-05-01 LAB — TSH: TSH: 1.62 m[IU]/L

## 2017-05-01 LAB — CORTISOL, FREE: Cortisol Free, Ser: 0.33 ug/dL

## 2017-05-03 ENCOUNTER — Ambulatory Visit (INDEPENDENT_AMBULATORY_CARE_PROVIDER_SITE_OTHER): Payer: 59 | Admitting: Osteopathic Medicine

## 2017-05-03 ENCOUNTER — Encounter: Payer: Self-pay | Admitting: Osteopathic Medicine

## 2017-05-03 DIAGNOSIS — N926 Irregular menstruation, unspecified: Secondary | ICD-10-CM | POA: Diagnosis not present

## 2017-05-03 DIAGNOSIS — R809 Proteinuria, unspecified: Secondary | ICD-10-CM | POA: Diagnosis not present

## 2017-05-03 DIAGNOSIS — I10 Essential (primary) hypertension: Secondary | ICD-10-CM

## 2017-05-03 HISTORY — DX: Irregular menstruation, unspecified: N92.6

## 2017-05-03 MED ORDER — METOPROLOL SUCCINATE ER 100 MG PO TB24: 100.0000 mg | ORAL_TABLET | Freq: Every day | ORAL | 1 refills | Status: DC

## 2017-05-03 NOTE — Patient Instructions (Signed)
Plan:  Pee test today! (or whenever you can give us the pee)  Increase Metoprolol from 50 mg daily to 100 mg daily  Referral to nephrologist for advice on: do we need kidney biopsy? Can they confirm a diagnosis/reason why the BP is so high?

## 2017-05-03 NOTE — Progress Notes (Signed)
HPI: Selena HildaLatoya Meyer is a 32 y.o. female who  has a past medical history of Bronchitis, Hypertension, and Irregular menstrual cycle.  [facility] today, 05/03/17,  for chief complaint of:  Follow up BP  HTN: BP better but still high, headaches better but still present.    On review of records, proteinuria has been present since as far back as 2008, I can't see it was ever worked up     Past medical history, surgical history, and family history reviewed.  Current medication list and allergy/intolerance information reviewed.   (See remainder of HPI, as well as ROS, PE below)     ASSESSMENT/PLAN:   Uncontrolled hypertension - Plan: metoprolol succinate (TOPROL-XL) 100 MG 24 hr tablet  Proteinuria, unspecified type - Plan: Protein Electrophoresis,Random Urn, Ambulatory referral to Nephrology  Irregular periods/menstrual cycles - Will hold off on Provera or other fertility stuff for now, pt advised pregnancy is not recommended until BP under better control at least, plus other workup pen     Meds ordered this encounter  Medications  . metoprolol succinate (TOPROL-XL) 100 MG 24 hr tablet    Sig: Take 1 tablet (100 mg total) by mouth daily. Take with or immediately following a meal.    Dispense:  30 tablet    Refill:  1    Please cancel the Rx for 50 mg dose    Patient Instructions  Plan:  Pee test today! (or whenever you can give us the pee)  Increase Metoprolol from 50 mg daily to 100 mg daily  Referral to nephrologist for advice on: do we need kidney biopsy? Can they confirm a diagnosis/reason why the BP is so high?    Follow-up plan: Return in about 1 week (around 05/10/2017) for recheck BP on higher dose metoprolol .      ^^^^^^^^^^^^^^^^^^^^^^^^^^^^^^^^^^^^^    Outpatient Encounter Medications as of 05/03/2017  Medication Sig  . acetaminophen (TYLENOL) 500 MG tablet Take 1,000 mg by mouth every 6 (six) hours as  needed for mild pain.   [name]. amLODipine (NORVASC) 10 MG tablet Take 1 tablet (10 mg total) by mouth daily.  . hydrochlorothiazide (HYDRODIURIL) 25 MG tablet Take 1 tablet (25 mg total) by mouth daily.  . metoprolol succinate (TOPROL-XL) 50 MG 24 hr tablet Take 1 tablet (50 mg total) by mouth daily. Take with or immediately following a meal.   No facility-administered encounter medications on file as of 05/03/2017.    Allergies  Allergen Reactions  . Ibuprofen Hives      Review of Systems:  Constitutional: No recent illness  HEENT: +headache, no vision change  Cardiac: No  chest pain, No  pressure  Respiratory:  No  shortness of breath. No  Cough  Musculoskeletal: No new myalgia/arthralgia  Exam:  BP (!) 151/102 (BP Location: Right Arm, Patient Position: Sitting, Cuff Size: Large)   Pulse 99   Temp 99.1 F (37.3 C) (Oral)   Wt 196 lb 4.8 oz (89 kg)   BMI 32.71 kg/m   Constitutional: VS see above. General Appearance: alert, well-developed, well-nourished, NAD  Eyes: Normal lids and conjunctive, non-icteric sclera  Ears, Nose, Mouth, Throat: MMM, Normal external inspection ears/nares/mouth/lips/gums.  Neck: No masses, trachea midline.   Respiratory: Normal respiratory effort. no wheeze, no rhonchi, no rales  Cardiovascular: S1/S2 normal, no murmur, no rub/gallop auscultated. RRR.   Musculoskeletal: Gait normal. Symmetric and independent movement of all extremities  Neurological: Normal balance/coordination. No tremor.  Skin:  warm, dry, intact.   Psychiatric: Normal judgment/insight. Normal mood and affect. Oriented x3.   Visit summary with medication list and pertinent instructions was printed for patient to review, alert [place] if any changes needed. All questions at time of visit were answered - patient instructed to contact office with any additional concerns. ER/RTC precautions were reviewed with the patient and understanding verbalized.   Follow-up plan: Return in  about 1 week (around 05/10/2017) for recheck BP on higher dose metoprolol .   Please note: voice recognition software was used to produce this document, and typos may escape review. Please contact Dr. Lyn Hollingshead for any needed clarifications.

## 2017-05-10 ENCOUNTER — Encounter: Payer: Self-pay | Admitting: Osteopathic Medicine

## 2017-05-10 ENCOUNTER — Ambulatory Visit (INDEPENDENT_AMBULATORY_CARE_PROVIDER_SITE_OTHER): Payer: 59 | Admitting: Osteopathic Medicine

## 2017-05-10 DIAGNOSIS — I10 Essential (primary) hypertension: Secondary | ICD-10-CM | POA: Diagnosis not present

## 2017-05-10 DIAGNOSIS — R809 Proteinuria, unspecified: Secondary | ICD-10-CM | POA: Diagnosis not present

## 2017-05-10 DIAGNOSIS — G44219 Episodic tension-type headache, not intractable: Secondary | ICD-10-CM

## 2017-05-10 HISTORY — DX: Essential (primary) hypertension: I10

## 2017-05-10 MED ORDER — BUTALBITAL-APAP-CAFFEINE 50-325-40 MG PO TABS: 1.0000 | ORAL_TABLET | ORAL | 0 refills | Status: AC | PRN

## 2017-05-10 NOTE — Progress Notes (Signed)
HPI: Selena Meyer is a 32 y.o. female who  has a past medical history of Bronchitis, Hypertension, Irregular menstrual cycle, Irregular periods/menstrual cycles (05/03/2017), Proteinuria (02/28/2013), and Uncontrolled hypertension (05/10/2017).  she presents to [facility] today, 05/10/17,  for chief complaint of:  Follow up BP  HTN: BP better but still not well controlled. Desire for fertility in the near future impacts medication choices, we are trying not to have a high pill burden, also. 147/99 on second check in the office.   Headaches: on and off for some time. We were hoping getting BP under better control would alleviate these symptoms. She reports the headaches better but still present. Headache is about the same on increased metoprolol, though was bad yesterday. Location varies, usually occipital or frontal, occasionally unilateral. Photophobia but no aura, nausea, phonophobia. Has had to use tylenol or excedrin most days of the month, has limited her use of ibuprofen but takes goody powders on occasion.    On review of records, proteinuria has been present since as far back as 2008, I can't see it was ever worked up. Last visit we sent referral to nephrology.      Past medical history, surgical history, and family history reviewed.  Current medication list and allergy/intolerance information reviewed.   (See remainder of HPI, as well as ROS, PE below)     ASSESSMENT/PLAN: Diagnoses of Uncontrolled hypertension, Proteinuria, unspecified type, and Episodic tension-type headache, not intractable were pertinent to this visit.  Patient Instructions  Please call [facility] at [phone] to set up an appointment with the kidney specialist.   Other headache/migraine therapies:  Butterbur extract  Feverfew   Magnesium citrate 400-800 mg per day   Fioricet Rx sparing use   Let's see if we can get into nephro in next 1-2 weeks, have our  office try to arrange this if there is a longer wait.   Can certainly consider switching from metoprolol to labetalol, amlodipine to nifedipine, methyldopa, hydralazine but she'd like to avoid multiple times per day dosing if at all possible.   Needs to complete urine protein electrophoresis for further evaluation proteinuria.   Headaches migraine vs tension-type. Triptans would be contraindicated given her HTN. Advised limit use OTC meds (med overuse headache may certainly be a factor in her case, too) and very sparing use Fioricet for severe headaches. Other remedies that may be helpful based on limited evidence were printed in her visit summary.    Meds ordered this encounter  Medications  . butalbital-acetaminophen-caffeine (FIORICET, ESGIC) 50-325-40 MG tablet    Sig: Take 1 tablet by mouth every 4 (four) hours as needed for headache. Max 2 pills in 24 hours, max use 5 days per month    Dispense:  20 tablet    Refill:  0      Follow-up plan: Return for recheck depending on kidney doctor recommendations .        ^^^^^^^^^^^^^^^^^^^^^^^^^^^^^^^^^^^^^ ^^^^^^^^^^^^^^^^^^^^^^^^^^^^^^^^^^^^^ ^^^^^^^^^^^^^^^^^^^^^^^^^^^^^^^^^^^^^    Outpatient Encounter Medications as of 05/10/2017  Medication Sig  . acetaminophen (TYLENOL) 500 MG tablet Take 1,000 mg by mouth every 6 (six) hours as needed for mild pain.   [name] amLODipine (NORVASC) 10 MG tablet Take 1 tablet (10 mg total) by mouth daily.  . hydrochlorothiazide (HYDRODIURIL) 25 MG tablet Take 1 tablet (25 mg total) by mouth daily.  . metoprolol succinate (TOPROL-XL) 100 MG 24 hr tablet Take 1 tablet (100 mg total) by mouth daily. Take with or immediately following a meal.  No facility-administered encounter medications on file as of 05/10/2017.    Allergies  Allergen Reactions  . Ibuprofen Hives      Review of Systems:  Constitutional: No recent illness  HEENT: +headache, no vision change  Cardiac: No  chest pain, No   pressure  Respiratory:  No  shortness of breath. No  Cough  Musculoskeletal: No new myalgia/arthralgia  Exam:  BP (!) 147/99 (BP Location: Right Arm, Patient Position: Sitting, Cuff Size: Large)   Pulse 73   Temp 98.7 F (37.1 C) (Oral)   Wt 196 lb 8 oz (89.1 kg)   BMI 32.74 kg/m   Constitutional: VS see above. General Appearance: alert, well-developed, well-nourished, NAD  Eyes: Normal lids and conjunctive, non-icteric sclera  Ears, Nose, Mouth, Throat: MMM, Normal external inspection ears/nares/mouth/lips/gums.  Neck: No masses, trachea midline.   Respiratory: Normal respiratory effort. no wheeze, no rhonchi, no rales  Cardiovascular: S1/S2 normal, no murmur, no rub/gallop auscultated. RRR.   Musculoskeletal: Gait normal. Symmetric and independent movement of all extremities  Neurological: Normal balance/coordination. No tremor.  Skin: warm, dry, intact.   Psychiatric: Normal judgment/insight. Normal mood and affect. Oriented x3.   Visit summary with medication list and pertinent instructions was printed for patient to review, alert [place] if any changes needed. All questions at time of visit were answered - patient instructed to contact office with any additional concerns. ER/RTC precautions were reviewed with the patient and understanding verbalized.   Follow-up plan: Return for recheck depending on kidney doctor recommendations .   Please note: voice recognition software was used to produce this document, and typos may escape review. Please contact Dr. Lyn Hollingshead for any needed clarifications.

## 2017-05-10 NOTE — Patient Instructions (Addendum)
Please call [facility] at [phone] to set up an appointment with the kidney specialist.   Other headache/migraine therapies:  Butterbur extract  Feverfew   Magnesium citrate 400-800 mg per day   Fioricet Rx sparing use

## 2017-05-16 ENCOUNTER — Telehealth: Payer: Self-pay

## 2017-05-16 NOTE — Telephone Encounter (Signed)
Pt aware Dr Lyn Hollingshead out of office and wanted to just leave an FYI to her that she called the kidney Dr and they have received her referral but they are about 2 months behind on scheduling referrals.  FYI to Dr Lyn Hollingshead

## 2017-05-18 ENCOUNTER — Telehealth: Payer: Self-pay

## 2017-05-18 DIAGNOSIS — I10 Essential (primary) hypertension: Secondary | ICD-10-CM

## 2017-05-18 MED ORDER — METOPROLOL SUCCINATE ER 50 MG PO TB24: 100.0000 mg | ORAL_TABLET | Freq: Every day | ORAL | 3 refills | Status: DC

## 2017-05-18 NOTE — Telephone Encounter (Signed)
Pt advised. No further needs at this time 

## 2017-05-18 NOTE — Telephone Encounter (Signed)
That's odd, but I went ahead and sent it in.

## 2017-05-18 NOTE — Telephone Encounter (Signed)
On 05-03-17, Metoprolol was increased from 50 to 100 mg QD. Pt's insurance does not cover the  tablets as well as it covers the  tabs.   Pt requesting RX to be sent for taking 2 tabs of  QD instead of the  tabs due to cost.   OK to change this RX fo pt and send? Please advise

## 2017-05-21 NOTE — Telephone Encounter (Signed)
Thanks for letting us know! Will route to referral coordinator: Can we maybe call to see if we can expedite the referral? She has uncontrolled hypertension and I believe may have undiagnosed kidney disease causing it - her medications have been a challenge also because she plans to become pregnant in the near future and I really would like nephrology opinion on medication management. Thanks!

## 2017-05-22 NOTE — Telephone Encounter (Signed)
I called [facility] and they are going to call and schedule with patient and then fax [place] over an appointment confirmation to let us know she has been scheduled - CF

## 2017-05-23 NOTE — Telephone Encounter (Signed)
Awesome! Thanks

## 2017-05-30 ENCOUNTER — Encounter: Payer: Self-pay | Admitting: Osteopathic Medicine

## 2017-07-09 ENCOUNTER — Ambulatory Visit: Payer: 59

## 2017-07-16 ENCOUNTER — Ambulatory Visit (INDEPENDENT_AMBULATORY_CARE_PROVIDER_SITE_OTHER): Payer: 59 | Admitting: Sports Medicine

## 2017-07-16 VITALS — BP 162/106 | HR 121 | Temp 98.9°F | Wt 199.0 lb

## 2017-07-16 DIAGNOSIS — I10 Essential (primary) hypertension: Secondary | ICD-10-CM | POA: Diagnosis not present

## 2017-07-16 DIAGNOSIS — Z111 Encounter for screening for respiratory tuberculosis: Secondary | ICD-10-CM

## 2017-07-16 DIAGNOSIS — R011 Cardiac murmur, unspecified: Secondary | ICD-10-CM | POA: Insufficient documentation

## 2017-07-16 HISTORY — DX: Cardiac murmur, unspecified: R01.1

## 2017-07-16 MED ORDER — METOPROLOL SUCCINATE ER 200 MG PO TB24: 200.0000 mg | ORAL_TABLET | Freq: Every day | ORAL | 3 refills | Status: DC

## 2017-07-16 MED ORDER — AMLODIPINE BESYLATE 10 MG PO TABS: 10.0000 mg | ORAL_TABLET | Freq: Every day | ORAL | 1 refills | Status: DC

## 2017-07-16 NOTE — Progress Notes (Signed)
   Subjective:    Patient ID: Selena Meyer, female    DOB: [birth year], 32 y.o.   MRN: [id number]  HPI Patient here in office today for PPD placement. PPD placed in right lower forearm without any complications. Pt tolerated well. Pt BP high but has no complaints of dizziness, palpitations, SOB, H/A, chest pain or shoulder or jaw pain. KG LPN   Review of Systems     Objective:   Physical Exam        Assessment & Plan:  Patient advised to recheck BP in 2 weeks with a nurse visit. New orders given to patient per Dr. Kris Mouton. KG LPN

## 2017-07-16 NOTE — Assessment & Plan Note (Addendum)
Hypertensive, tachycardic. Continue hydrochlorthiazide 25, increasing Toprol-XL to 200 mg daily, restarting amlodipine. She will get her own blood pressure cuff, check at home and bring me a diary. Avoid sodium intake. In addition she has a systolic murmur, adding an echocardiogram. TFTs normal recently. Avoiding ACE inhibitors and angiotensin receptor blockers as she is trying to get pregnant.

## 2017-07-16 NOTE — Assessment & Plan Note (Signed)
Adding echocardiogram.

## 2017-07-16 NOTE — Progress Notes (Signed)
Subjective:    CC: Elevated blood pressure  HPI: This is a pleasant 32 year old female, she was here for a TB skin test but noted blood pressure extremely high, tachycardia.  I was called to see her, looking back she has had high blood pressures at most of her visits, mild tachycardia.  She is currently taking 100 mg of Toprol-XL, hydrochlorothiazide and recently discontinued amlodipine.  No headaches, visual changes, chest pain, no leg swelling.  She has been kept off of ACE inhibitors and angiotensin receptor blockers that she is trying to be pregnant.  I reviewed the past medical history, family history, social history, surgical history, and allergies today and no changes were needed.  Please see the problem list section below in epic for further details.  Past Medical History: Past Medical History:  Diagnosis Date  . Bronchitis   . Hypertension   . Irregular menstrual cycle   . Irregular periods/menstrual cycles 05/03/2017   Will hold off on Provera or other fertility stuff for now, pt advised pregnancy is not recommended until BP under better control at least, plus other workup pen  . Proteinuria 02/28/2013  . Uncontrolled hypertension 05/10/2017   Past Surgical History: No past surgical history on file. Social History: Social History   Socioeconomic History  . Marital status: Married    Spouse name: Ivar DrapeWillie  . Number of children: 0  . Years of education: Not on file  . Highest education level: Not on file  Occupational History    Employer: UNEMPLOYED  Social Needs  . Financial resource strain: Not on file  . Food insecurity:    Worry: Not on file    Inability: Not on file  . Transportation needs:    Medical: Not on file    Non-medical: Not on file  Tobacco Use  . Smoking status: Current Every Day Smoker    Packs/day: 0.25    Years: 5.00    Pack years: 1.25    Types: Cigarettes  . Smokeless tobacco: Never Used  Substance and Sexual Activity  . Alcohol use: Yes   Comment: "occasional use; social drinker"  . Drug use: No  . Sexual activity: Yes    Partners: Male    Birth control/protection: None  Lifestyle  . Physical activity:    Days per week: Not on file    Minutes per session: Not on file  . Stress: Not on file  Relationships  . Social connections:    Talks on phone: Not on file    Gets together: Not on file    Attends religious service: Not on file    Active member of club or organization: Not on file    Attends meetings of clubs or organizations: Not on file    Relationship status: Not on file  Other Topics Concern  . Not on file  Social History Narrative  . Not on file   Family History: Family History  Problem Relation Age of Onset  . Diabetes Mother   . High blood pressure Mother   . Diabetes Sister   . Cancer Maternal Grandmother   . Diabetes Maternal Grandmother   . High blood pressure Maternal Grandmother   . Cancer Maternal Aunt    Allergies: Allergies  Allergen Reactions  . Ibuprofen Hives   Medications: See med rec.  Review of Systems: No fevers, chills, night sweats, weight loss, chest pain, or shortness of breath.   Objective:    General: Well Developed, well nourished, and in no acute distress.  Neuro: Alert and oriented x3, extra-ocular muscles intact, sensation grossly intact.  HEENT: Normocephalic, atraumatic, pupils equal round reactive to light, neck supple, no masses, no lymphadenopathy, thyroid nonpalpable.  Skin: Warm and dry, no rashes. Cardiac: Regular rate and rhythm, no rubs or gallops, 2/6 systolic ejection murmur in the precordium, no lower extremity edema.  Respiratory: Clear to auscultation bilaterally. Not using accessory muscles, speaking in full sentences.  Impression and Recommendations:    Uncontrolled hypertension Hypertensive, tachycardic. Continue hydrochlorthiazide 25, increasing Toprol-XL to 200 mg daily, restarting amlodipine. She will get her own blood pressure cuff, check at  home and bring me a diary. Avoid sodium intake. In addition she has a systolic murmur, adding an echocardiogram. TFTs normal recently. Avoiding ACE inhibitors and angiotensin receptor blockers as she is trying to get pregnant.  Systolic murmur Adding echocardiogram.  I spent 25 minutes with this patient, greater than 50% was face-to-face time counseling regarding the above diagnoses ___________________________________________ Ihor Austin. Benjamin Stain, M.D., ABFM., CAQSM. Primary Care and Sports Medicine [facility]  Adjunct Instructor of Family Medicine  [employer]

## 2017-07-18 ENCOUNTER — Ambulatory Visit (INDEPENDENT_AMBULATORY_CARE_PROVIDER_SITE_OTHER): Payer: 59 | Admitting: Osteopathic Medicine

## 2017-07-18 VITALS — BP 151/101 | HR 72 | Wt 196.0 lb

## 2017-07-18 DIAGNOSIS — I10 Essential (primary) hypertension: Secondary | ICD-10-CM | POA: Diagnosis not present

## 2017-07-18 DIAGNOSIS — Z111 Encounter for screening for respiratory tuberculosis: Secondary | ICD-10-CM

## 2017-07-18 DIAGNOSIS — R809 Proteinuria, unspecified: Secondary | ICD-10-CM | POA: Diagnosis not present

## 2017-07-18 LAB — TB SKIN TEST
Induration: 0 mm
TB Skin Test: NEGATIVE

## 2017-07-18 NOTE — Patient Instructions (Signed)
Plan:  Increase metoprolol to 200 mg daily  Please call to get set up with the kidney specialist: [facility] at [phone]

## 2017-07-18 NOTE — Progress Notes (Signed)
   Subjective:    Patient ID: Selena Meyer, female    DOB: 02/09/1985, 32 y.o.   MRN: [id number]  HPI  Selena Meyer is here for a PPD read. Her blood pressure is elevated. 164/94.  Review of Systems     Objective:   Physical Exam        Assessment & Plan:  PPD - negative 0 mm   Hypertension - Per Dr Lyn HollingsheadAlexander, Plan: Increase metoprolol to 200 mg daily. Please call to get set up with the kidney specialist: [facility] at [phone]

## 2017-07-19 NOTE — Progress Notes (Signed)
Patient Instructions  Plan:  Increase metoprolol to 200 mg daily  Please call to get set up with the kidney specialist: [facility] at [phone]   Return for recheck BP 1 week (can double book a last appt of AM or PM, or pt can be on nurse sched whenever) .

## 2017-07-20 ENCOUNTER — Telehealth: Payer: Self-pay | Admitting: Osteopathic Medicine

## 2017-07-20 NOTE — Telephone Encounter (Signed)
Paperwork completed and placed in assistant's in-basket - please call when copy is up front for patient to pick up, no fax number or address for us to send directly

## 2017-07-20 NOTE — Telephone Encounter (Signed)
Left message for patient that her form was ready for pickup at the front desk. Form sent to scan and copy made and placed in folder above desk. Form placed at front in folder.

## 2017-07-25 ENCOUNTER — Ambulatory Visit (INDEPENDENT_AMBULATORY_CARE_PROVIDER_SITE_OTHER): Payer: 59 | Admitting: Osteopathic Medicine

## 2017-07-25 VITALS — BP 136/85 | HR 79

## 2017-07-25 DIAGNOSIS — I10 Essential (primary) hypertension: Secondary | ICD-10-CM | POA: Diagnosis not present

## 2017-07-25 NOTE — Progress Notes (Signed)
Pt came into clinic today for BP check. She reports she has been taking BP at home, and this morning it was 132/87 (pulse 83). In last note she was to increase metoprolol to 200mg  daily. She has made this increase. Pt's BP was borderline on first check, and within range on repeat. She will bring a BP log with her to next OV with PCP. No further questions.   Vitals:   07/25/17 0919 07/25/17 0925  BP: 140/85 136/85  Pulse: 75 79

## 2017-07-25 NOTE — Progress Notes (Signed)
Blood pressure not quite at goal but definitely better.  Okay to continue medications as is, she still needs to follow-up with nephrology regarding proteinuria/medication management assistance with hypertension

## 2017-07-30 ENCOUNTER — Ambulatory Visit: Payer: 59

## 2017-07-31 ENCOUNTER — Other Ambulatory Visit: Payer: Self-pay | Admitting: Osteopathic Medicine

## 2017-07-31 DIAGNOSIS — I10 Essential (primary) hypertension: Secondary | ICD-10-CM

## 2017-08-01 ENCOUNTER — Ambulatory Visit (HOSPITAL_BASED_OUTPATIENT_CLINIC_OR_DEPARTMENT_OTHER)
Admission: RE | Admit: 2017-08-01 | Discharge: 2017-08-01 | Disposition: A | Discharge status: Home / Self Care | Payer: 59 | Source: Ambulatory Visit | Attending: Sports Medicine | Admitting: Sports Medicine

## 2017-08-01 DIAGNOSIS — R011 Cardiac murmur, unspecified: Secondary | ICD-10-CM | POA: Diagnosis not present

## 2017-08-01 DIAGNOSIS — Z720 Tobacco use: Secondary | ICD-10-CM | POA: Diagnosis not present

## 2017-08-01 DIAGNOSIS — I119 Hypertensive heart disease without heart failure: Secondary | ICD-10-CM | POA: Diagnosis not present

## 2017-08-01 NOTE — Progress Notes (Signed)
  Echocardiogram 2D Echocardiogram has been performed.  Jashun Puertas T Flossie Wexler 08/01/2017, 10:51 AM

## 2017-08-06 ENCOUNTER — Encounter: Payer: Self-pay | Admitting: Osteopathic Medicine

## 2017-08-06 ENCOUNTER — Ambulatory Visit (INDEPENDENT_AMBULATORY_CARE_PROVIDER_SITE_OTHER): Payer: 59 | Admitting: Osteopathic Medicine

## 2017-08-06 VITALS — BP 133/86 | HR 81 | Temp 98.7°F | Wt 196.3 lb

## 2017-08-06 DIAGNOSIS — R809 Proteinuria, unspecified: Secondary | ICD-10-CM

## 2017-08-06 DIAGNOSIS — Z3169 Encounter for other general counseling and advice on procreation: Secondary | ICD-10-CM | POA: Insufficient documentation

## 2017-08-06 DIAGNOSIS — I10 Essential (primary) hypertension: Secondary | ICD-10-CM

## 2017-08-06 DIAGNOSIS — G44219 Episodic tension-type headache, not intractable: Secondary | ICD-10-CM

## 2017-08-06 HISTORY — DX: Episodic tension-type headache, not intractable: G44.219

## 2017-08-06 NOTE — Patient Instructions (Addendum)
Please keep upcoming appointment: [facility] on 08/13/17 at 11:00 am with Dr. Glenna FellowsKruska.   Follow up with me for annual checkup sometime next 6 mos (preventive care). WIll see what Dr Glenna FellowsKruska says about other BP management.

## 2017-08-06 NOTE — Progress Notes (Signed)
HPI: Selena Meyer is a 32 y.o. female who  has a past medical history of Bronchitis, Hypertension, Irregular menstrual cycle, Irregular periods/menstrual cycles (05/03/2017), Proteinuria (02/28/2013), and Uncontrolled hypertension (05/10/2017).  she presents to [facility] today, 08/06/17,  for chief complaint of:  BP recheck   HTN: Blood pressure is looking a lot better today.  No chest pain, pressure, palpitations.  Headaches are still bothering her a little bit.  She never got the Fioricet filled.  Inquires about fertility issues: She and her husband have been trying for over a year, she was previously evaluated by a fertility specialist, inquires that she might be able to make him an appointment for semen analysis they should follow-up with the fertility folks   Past medical history, surgical history, and family history reviewed.  Current medication list and allergy/intolerance information reviewed.   (See remainder of HPI, ROS, Phys Exam below)       ASSESSMENT/PLAN:   Blood pressure still a bit above goal but definitely better than it has been.  Will defer further management/work-up to nephrology.    Regarding fertility issues, I will contact lab and see if semen analysis is a specimen that they can handle here or if there is anything in particular that would need to be seen in house by fertility specialist.  Hypertension, unspecified type  Proteinuria, unspecified type  Episodic tension-type headache, not intractable  Infertility counseling     Patient Instructions  Please keep upcoming appointment: [facility] on 08/13/17 at 11:00 am with Dr. Glenna FellowsKruska.   Follow up with me for annual checkup sometime next 6 mos (preventive care). WIll see what Dr Glenna FellowsKruska says about other BP management.      Follow-up plan: Return for Recheck depending on findings/recommendations of  nephrology.     ############################################ ############################################ ############################################ ############################################    Outpatient Encounter Medications as of 08/06/2017  Medication Sig  . acetaminophen (TYLENOL) 500 MG tablet Take 1,000 mg by mouth every 6 (six) hours as needed for mild pain.   [name]. amLODipine (NORVASC) 10 MG tablet Take 1 tablet (10 mg total) by mouth daily.  . butalbital-acetaminophen-caffeine (FIORICET, ESGIC) 50-325-40 MG tablet Take 1 tablet by mouth every 4 (four) hours as needed for headache. Max 2 pills in 24 hours, max use 5 days per month  . hydrochlorothiazide (HYDRODIURIL) 25 MG tablet TAKE 1 TABLET BY MOUTH ONCE DAILY  . metoprolol succinate (TOPROL-XL) 200 MG 24 hr tablet Take 1 tablet (200 mg total) by mouth daily. Take with or immediately following a meal.  . [DISCONTINUED] hydrochlorothiazide (HYDRODIURIL) 25 MG tablet Take 1 tablet (25 mg total) by mouth daily.   No facility-administered encounter medications on file as of 08/06/2017.    Allergies  Allergen Reactions  . Ibuprofen Hives      Review of Systems:  Constitutional: No recent illness  HEENT: +headache, no vision change  Cardiac: No  chest pain, No  pressure, No palpitations  Respiratory:  No  shortness of breath. No  Cough  Gastrointestinal: No  abdominal pain  Neurologic: No  weakness, No  Dizziness  Psychiatric: No  concerns with depression, No  concerns with anxiety  Exam:  BP 133/86 (BP Location: Left Arm, Patient Position: Sitting, Cuff Size: Large)   Pulse 81   Temp 98.7 F (37.1 C) (Oral)   Wt 196 lb 4.8 oz (89 kg)   BMI 32.71 kg/m   Constitutional: VS see above. General Appearance: alert, well-developed, well-nourished, NAD  Eyes: Normal lids and conjunctive, non-icteric sclera  Ears, Nose, Mouth, Throat: MMM, Normal external inspection ears/nares/mouth/lips/gums.  Neck: No masses,  trachea midline.   Respiratory: Normal respiratory effort. no wheeze, no rhonchi, no rales  Cardiovascular: S1/S2 normal, no murmur auscultated today, no rub/gallop auscultated. RRR.   Musculoskeletal: Gait normal. Symmetric and independent movement of all extremities  Neurological: Normal balance/coordination. No tremor.  Skin: warm, dry, intact.   Psychiatric: Normal judgment/insight. Normal mood and affect. Oriented x3.   Visit summary with medication list and pertinent instructions was printed for patient to review, advised to alert [place] if any changes needed. All questions at time of visit were answered - patient instructed to contact office with any additional concerns. ER/RTC precautions were reviewed with the patient and understanding verbalized.   Follow-up plan: Return for Recheck depending on findings/recommendations of nephrology.  Note: Total time spent 25 minutes, greater than 50% of the visit was spent face-to-face counseling and coordinating care for the following: The primary encounter diagnosis was Hypertension, unspecified type. Diagnoses of Proteinuria, unspecified type, Episodic tension-type headache, not intractable, and Infertility counseling were also pertinent to this visit.[name]  Please note: voice recognition software was used to produce this document, and typos may escape review. Please contact Dr. Lyn Hollingshead for any needed clarifications.

## 2017-08-29 ENCOUNTER — Other Ambulatory Visit (HOSPITAL_COMMUNITY): Payer: Self-pay | Admitting: Internal Medicine

## 2017-08-29 DIAGNOSIS — R809 Proteinuria, unspecified: Secondary | ICD-10-CM

## 2017-09-04 ENCOUNTER — Other Ambulatory Visit (HOSPITAL_COMMUNITY)
Admission: RE | Admit: 2017-09-04 | Discharge: 2017-09-04 | Disposition: A | Discharge status: Home / Self Care | Payer: 59 | Source: Ambulatory Visit | Attending: Osteopathic Medicine | Admitting: Osteopathic Medicine

## 2017-09-04 ENCOUNTER — Encounter: Payer: Self-pay | Admitting: Osteopathic Medicine

## 2017-09-04 ENCOUNTER — Ambulatory Visit (INDEPENDENT_AMBULATORY_CARE_PROVIDER_SITE_OTHER): Payer: 59 | Admitting: Osteopathic Medicine

## 2017-09-04 VITALS — BP 139/92 | HR 73 | Temp 98.4°F | Wt 195.7 lb

## 2017-09-04 DIAGNOSIS — I10 Essential (primary) hypertension: Secondary | ICD-10-CM

## 2017-09-04 DIAGNOSIS — N926 Irregular menstruation, unspecified: Secondary | ICD-10-CM

## 2017-09-04 DIAGNOSIS — R809 Proteinuria, unspecified: Secondary | ICD-10-CM

## 2017-09-04 DIAGNOSIS — Z0000 Encounter for general adult medical examination without abnormal findings: Secondary | ICD-10-CM | POA: Diagnosis not present

## 2017-09-04 LAB — POCT URINE PREGNANCY: PREG TEST UR: NEGATIVE

## 2017-09-04 NOTE — Progress Notes (Signed)
HPI: Selena Meyer is a 32 y.o. female who  has a past medical history of Bronchitis, Hypertension, Irregular menstrual cycle, Irregular periods/menstrual cycles (05/03/2017), Proteinuria (02/28/2013), and Uncontrolled hypertension (05/10/2017).  she presents to [facility] today, 09/04/17,  for chief complaint of: Annual physical   Needs annual check-up for work, form filled out for her employer, ok to continue teaching!   We have been following since 04/2017 for HTN/proteinuria and she is seeing nephrology for this as well. No other concerns today.   Reports mild cough for about a week. Works as a Runner, broadcasting/film/video and thinks she picked something up form the kids. Not really worrying her today.        Past medical, surgical, social and family history reviewed:  Patient Active Problem List   Diagnosis Date Noted  . Episodic tension-type headache, not intractable 08/06/2017  . Infertility counseling 08/06/2017  . Systolic murmur 07/16/2017  . Uncontrolled hypertension 05/10/2017  . Irregular periods/menstrual cycles 05/03/2017  . Hypertensive urgency 02/28/2013  . Tobacco abuse 02/28/2013  . Proteinuria 02/28/2013   No past surgical history on file.  Social History   Tobacco Use  . Smoking status: Current Some Day Smoker    Packs/day: 0.25    Years: 5.00    Pack years: 1.25    Types: Cigarettes  . Smokeless tobacco: Never Used  Substance Use Topics  . Alcohol use: Yes    Comment: "occasional use; social drinker"    Family History  Problem Relation Age of Onset  . Diabetes Mother   . High blood pressure Mother   . Diabetes Sister   . Cancer Maternal Grandmother   . Diabetes Maternal Grandmother   . High blood pressure Maternal Grandmother   . Cancer Maternal Aunt      Current medication list and allergy/intolerance information reviewed:    Current Outpatient Medications  Medication Sig Dispense Refill  . acetaminophen (TYLENOL) 500 MG  tablet Take 1,000 mg by mouth every 6 (six) hours as needed for mild pain.     [name] amLODipine (NORVASC) 10 MG tablet Take 1 tablet (10 mg total) by mouth daily. 30 tablet 1  . butalbital-acetaminophen-caffeine (FIORICET, ESGIC) 50-325-40 MG tablet Take 1 tablet by mouth every 4 (four) hours as needed for headache. Max 2 pills in 24 hours, max use 5 days per month 20 tablet 0  . hydrochlorothiazide (HYDRODIURIL) 25 MG tablet TAKE 1 TABLET BY MOUTH ONCE DAILY 30 tablet 1  . metoprolol succinate (TOPROL-XL) 200 MG 24 hr tablet Take 1 tablet (200 mg total) by mouth daily. Take with or immediately following a meal. 30 tablet 3   No current facility-administered medications for this visit.     Allergies  Allergen Reactions  . Ibuprofen Hives      Review of Systems:  Constitutional:  No  fever, no chills, No recent illness, No unintentional weight changes. No significant fatigue.   HEENT: No  headache, no vision change, no hearing change, No sore throat, No  sinus pressure  Cardiac: No  chest pain, No  pressure, No palpitations, No  Orthopnea  Respiratory:  No  shortness of breath. No  Cough  Gastrointestinal: No  abdominal pain, No  nausea, No  vomiting,  No  blood in stool, No  diarrhea, No  constipation   Musculoskeletal: No new myalgia/arthralgia  Skin: No  Rash, No other wounds/concerning lesions  Genitourinary: No  incontinence, No  abnormal genital bleeding, No abnormal genital discharge  Hem/Onc: No  easy bruising/bleeding, No  abnormal lymph node  Endocrine: No cold intolerance,  No heat intolerance. No polyuria/polydipsia/polyphagia   Neurologic: No  weakness, No  dizziness, No  slurred speech/focal weakness/facial droop  Psychiatric: No  concerns with depression, No  concerns with anxiety, No sleep problems, No mood problems  Exam:  BP (!) 139/92 (BP Location: Left Arm, Patient Position: Sitting, Cuff Size: Large)   Pulse 73   Temp 98.4 F (36.9 C) (Oral)   Wt 195 lb  11.2 oz (88.8 kg)   SpO2 100%   BMI 32.61 kg/m   Constitutional: VS see above. General Appearance: alert, well-developed, well-nourished, NAD  Eyes: Normal lids and conjunctive, non-icteric sclera  Ears, Nose, Mouth, Throat: MMM, Normal external inspection ears/nares/mouth/lips/gums. TM normal bilaterally. Pharynx/tonsils no erythema, no exudate. Nasal mucosa normal.   Neck: No masses, trachea midline. No thyroid enlargement. No tenderness/mass appreciated. No lymphadenopathy  Respiratory: Normal respiratory effort. no wheeze, no rhonchi, no rales  Cardiovascular: S1/S2 normal, no murmur, no rub/gallop auscultated. RRR. No lower extremity edema. Pedal pulse II/IV bilaterally DP and PT. No carotid bruit or JVD. No abdominal aortic bruit.  Gastrointestinal: Nontender, no masses. No hepatomegaly, no splenomegaly. No hernia appreciated. Bowel sounds normal. Rectal exam deferred.   Musculoskeletal: Gait normal. No clubbing/cyanosis of digits.   Neurological: Normal balance/coordination. No tremor. No cranial nerve deficit on limited exam. Motor and sensation intact and symmetric. Cerebellar reflexes intact.   Skin: warm, dry, intact. No rash/ulcer. No concerning nevi or subq nodules on limited exam.    Psychiatric: Normal judgment/insight. Normal mood and affect. Oriented x3.  GYN: No lesions/ulcers to external genitalia, normal urethra, normal vaginal mucosa, physiologic discharge, cervix normal without lesions, uterus not enlarged or tender, adnexa no masses and nontender  BREAST: No rashes/skin changes, normal fibrous breast tissue, no masses or tenderness, normal nipple without discharge, normal axilla    Results for orders placed or performed in visit on 09/04/17 (from the past 72 hour(s))  POCT urine pregnancy     Status: None   Collection Time: 09/04/17  9:55 AM  Result Value Ref Range   Preg Test, Ur Negative Negative      ASSESSMENT/PLAN:   Annual physical exam - Plan:  Cytology - PAP  Hypertension, unspecified type  Proteinuria, unspecified type  Missed period - Plan: POCT urine pregnancy    Patient Instructions  Review preventive care:   General Preventive Care  Tobacco: don't! Alcohol: moderation is ok for most responsible adults. Recreational/Illicit Drugs: don't!  Exercise: as tolerated to reduce risk of cardiovascular disease and diabetes  Mental health: if need for mental health care (medicines, counseling, other), or concerns about moods, please let me know!   Sexual health: if need for STD testing, or concerns about pain/libido problems, issues with bleeding, etc, please let me know!   Vaccines  Flu vaccine: recommended every fall (by Halloween!)  Shingles vaccine: Shingrix recommended after age 27  Pneumonia vaccines: Prevnar and Pneumovax recommended after age 37  Tetanus booster: Tdap recommended every 10 years  Cancer screenings   Colon cancer screening: recommended at age 98, colonoscopy sooner if risk factors   Breast cancer screening: mammogram recommended at age 35  Cervical cancer screening: every 1 to 5 years depending on age and other risk factors. Can stop at age 41 or w/ hysterectomy as long as previous testing was normal.   Infection screenings . HIV: recommended screening at least once age 25-65, more often if risk factors  .  Gonorrhea/Chlamydia: otherwise screening as needed . Hepatitis C: recommended for anyone born 1967  Other . Bone Density Test: recommended for women at age 34 . Advanced Directive: Living Will and/or Healthcare Power of Attorney recommended for everyone, regardless of age or health status . Cholesterol/Diabetes: recommended screening annually     Visit summary with medication list and pertinent instructions was printed for patient to review. All questions at time of visit were answered - patient instructed to contact office with any additional concerns. ER/RTC precautions were  reviewed with the patient.   Follow-up plan: Return for recheck depending on kidney doctor's findings and recommendations .   Please note: voice recognition software was used to produce this document, and typos may escape review. Please contact Dr. Lyn HollingsheadAlexander for any needed clarifications.

## 2017-09-04 NOTE — Patient Instructions (Signed)
Review preventive care:   General Preventive Care  Tobacco: don't! Alcohol: moderation is ok for most responsible adults. Recreational/Illicit Drugs: don't!  Exercise: as tolerated to reduce risk of cardiovascular disease and diabetes  Mental health: if need for mental health care (medicines, counseling, other), or concerns about moods, please let me know!   Sexual health: if need for STD testing, or concerns about pain/libido problems, issues with bleeding, etc, please let me know!   Vaccines  Flu vaccine: recommended every fall (by Halloween!)  Shingles vaccine: Shingrix recommended after age 34  Pneumonia vaccines: Prevnar and Pneumovax recommended after age 34  Tetanus booster: Tdap recommended every 10 years  Cancer screenings   Colon cancer screening: recommended at age 34, colonoscopy sooner if risk factors   Breast cancer screening: mammogram recommended at age 34  Cervical cancer screening: every 1 to 5 years depending on age and other risk factors. Can stop at age 34 or w/ hysterectomy as long as previous testing was normal.   Infection screenings . HIV: recommended screening at least once age 34-65, more often if risk factors  . Gonorrhea/Chlamydia: otherwise screening as needed . Hepatitis C: recommended for anyone born 1967  Other . Bone Density Test: recommended for women at age 34 . Advanced Directive: Living Will and/or Healthcare Power of Attorney recommended for everyone, regardless of age or health status . Cholesterol/Diabetes: recommended screening annually

## 2017-09-05 LAB — CYTOLOGY - PAP
Diagnosis: NEGATIVE
HPV (WINDOPATH): NOT DETECTED

## 2017-09-12 ENCOUNTER — Other Ambulatory Visit: Payer: Self-pay | Admitting: Radiology

## 2017-09-13 ENCOUNTER — Other Ambulatory Visit: Payer: Self-pay | Admitting: Radiology

## 2017-09-13 ENCOUNTER — Other Ambulatory Visit: Payer: Self-pay | Admitting: Student

## 2017-09-14 ENCOUNTER — Ambulatory Visit (HOSPITAL_COMMUNITY)
Admission: RE | Admit: 2017-09-14 | Discharge: 2017-09-14 | Disposition: A | Discharge status: Home / Self Care | Payer: 59 | Source: Ambulatory Visit | Attending: Internal Medicine | Admitting: Internal Medicine

## 2017-09-14 ENCOUNTER — Encounter (HOSPITAL_COMMUNITY): Payer: Self-pay

## 2017-09-14 DIAGNOSIS — F17210 Nicotine dependence, cigarettes, uncomplicated: Secondary | ICD-10-CM | POA: Diagnosis not present

## 2017-09-14 DIAGNOSIS — R809 Proteinuria, unspecified: Secondary | ICD-10-CM | POA: Diagnosis not present

## 2017-09-14 DIAGNOSIS — Z79899 Other long term (current) drug therapy: Secondary | ICD-10-CM | POA: Diagnosis not present

## 2017-09-14 DIAGNOSIS — I10 Essential (primary) hypertension: Secondary | ICD-10-CM | POA: Diagnosis not present

## 2017-09-14 DIAGNOSIS — Z886 Allergy status to analgesic agent status: Secondary | ICD-10-CM | POA: Insufficient documentation

## 2017-09-14 LAB — PROTIME-INR
INR: 0.95
PROTHROMBIN TIME: 12.6 s (ref 11.4–15.2)

## 2017-09-14 LAB — BASIC METABOLIC PANEL
Anion gap: 10 (ref 5–15)
BUN: 17 mg/dL (ref 6–20)
CALCIUM: 9.3 mg/dL (ref 8.9–10.3)
CO2: 25 mmol/L (ref 22–32)
CREATININE: 1.26 mg/dL — AB (ref 0.44–1.00)
Chloride: 103 mmol/L (ref 98–111)
GFR calc Af Amer: >60 mL/min (ref >60)
GFR, EST NON AFRICAN AMERICAN: 55 mL/min — AB (ref >60)
GLUCOSE: 134 mg/dL — AB (ref 70–99)
Potassium: 3.8 mmol/L (ref 3.5–5.1)
SODIUM: 138 mmol/L (ref 135–145)

## 2017-09-14 LAB — CBC WITH DIFFERENTIAL/PLATELET
Abs Immature Granulocytes: 0 10*3/uL (ref 0.0–0.1)
Basophils Absolute: 0.1 10*3/uL (ref 0.0–0.1)
Basophils Relative: 1 %
EOS ABS: 0.3 10*3/uL (ref 0.0–0.7)
EOS PCT: 3 %
HCT: 43 % (ref 36.0–46.0)
HEMOGLOBIN: 14.2 g/dL (ref 12.0–15.0)
Immature Granulocytes: 0 %
LYMPHS PCT: 35 %
Lymphs Abs: 3.6 10*3/uL (ref 0.7–4.0)
MCH: 29.4 pg (ref 26.0–34.0)
MCHC: 33 g/dL (ref 30.0–36.0)
MCV: 89 fL (ref 78.0–100.0)
MONO ABS: 0.8 10*3/uL (ref 0.1–1.0)
Monocytes Relative: 8 %
Neutro Abs: 5.4 10*3/uL (ref 1.7–7.7)
Neutrophils Relative %: 53 %
Platelets: 419 10*3/uL — ABNORMAL HIGH (ref 150–400)
RBC: 4.83 MIL/uL (ref 3.87–5.11)
RDW: 13.7 % (ref 11.5–15.5)
WBC: 10.2 10*3/uL (ref 4.0–10.5)

## 2017-09-14 LAB — PREGNANCY, URINE: Preg Test, Ur: NEGATIVE

## 2017-09-14 MED ORDER — LIDOCAINE HCL (PF) 1 % IJ SOLN
INTRAMUSCULAR | Status: AC
  Filled 2017-09-14: qty 30

## 2017-09-14 MED ORDER — FENTANYL CITRATE (PF) 100 MCG/2ML IJ SOLN
INTRAMUSCULAR | Status: AC
  Filled 2017-09-14: qty 4

## 2017-09-14 MED ORDER — MIDAZOLAM HCL 5 MG/5ML IJ SOLN
INTRAMUSCULAR | Status: AC | PRN
  Administered 2017-09-14: 0.5 mg via INTRAVENOUS

## 2017-09-14 MED ORDER — MIDAZOLAM HCL 2 MG/2ML IJ SOLN
INTRAMUSCULAR | Status: AC | PRN
  Administered 2017-09-14: 1 mg via INTRAVENOUS

## 2017-09-14 MED ORDER — MIDAZOLAM HCL 2 MG/2ML IJ SOLN
INTRAMUSCULAR | Status: AC
  Filled 2017-09-14: qty 4

## 2017-09-14 MED ORDER — SODIUM CHLORIDE 0.9 % IV SOLN: INTRAVENOUS | Status: DC

## 2017-09-14 MED ORDER — FENTANYL CITRATE (PF) 100 MCG/2ML IJ SOLN
INTRAMUSCULAR | Status: AC | PRN
  Administered 2017-09-14: 50 ug via INTRAVENOUS
  Administered 2017-09-14: 25 ug via INTRAVENOUS

## 2017-09-14 NOTE — H&P (Signed)
Chief Complaint: Patient was seen in consultation today for proteinuria  Referring Physician(s): Estill Bakes A  Supervising Physician: Jolaine Click  Patient Status: [facility] - Out-pt  History of Present Illness: Selena Meyer is a 32 y.o. female with past medical history of HTN presents with complaint of proteinuria.  A random renal biopsy has been requested by her Nephrologist, Dr. Glenna Fellows. She presents today in her usual state of health. She has been NPO.  She does not take blood thinners.   Past Medical History:  Diagnosis Date  . Bronchitis   . Hypertension   . Irregular menstrual cycle   . Irregular periods/menstrual cycles 05/03/2017   Will hold off on Provera or other fertility stuff for now, pt advised pregnancy is not recommended until BP under better control at least, plus other workup pen  . Proteinuria 02/28/2013  . Uncontrolled hypertension 05/10/2017    History reviewed. No pertinent surgical history.  Allergies: Ibuprofen  Medications: Prior to Admission medications   Medication Sig Start Date End Date Taking? Authorizing Provider  acetaminophen (TYLENOL) 500 MG tablet Take 1,000 mg by mouth every 6 (six) hours as needed for mild pain.    Yes [provider]  amLODipine (NORVASC) 10 MG tablet Take 1 tablet (10 mg total) by mouth daily. 07/16/17  Yes Monica Becton, MD  hydrochlorothiazide (HYDRODIURIL) 25 MG tablet TAKE 1 TABLET BY MOUTH ONCE DAILY 08/01/17  Yes Rodolph Bong, MD  metoprolol succinate (TOPROL-XL) 200 MG 24 hr tablet Take 1 tablet (200 mg total) by mouth daily. Take with or immediately following a meal. 07/16/17  Yes Monica Becton, MD  Multiple Vitamins-Minerals (MULTIVITAMIN ADULT) CHEW Chew 2 each by mouth daily.   Yes [provider]  butalbital-acetaminophen-caffeine (FIORICET, ESGIC) 50-325-40 MG tablet Take 1 tablet by mouth every 4 (four) hours as needed for headache. Max 2 pills in 24 hours, max use 5 days per  month 05/10/17 05/10/18  Sunnie Nielsen, DO     Family History  Problem Relation Age of Onset  . Diabetes Mother   . High blood pressure Mother   . Diabetes Sister   . Cancer Maternal Grandmother   . Diabetes Maternal Grandmother   . High blood pressure Maternal Grandmother   . Cancer Maternal Aunt        breast    Social History   Socioeconomic History  . Marital status: Married    Spouse name: Ivar Drape  . Number of children: 0  . Years of education: Not on file  . Highest education level: Not on file  Occupational History    Employer: UNEMPLOYED  Social Needs  . Financial resource strain: Not on file  . Food insecurity:    Worry: Not on file    Inability: Not on file  . Transportation needs:    Medical: Not on file    Non-medical: Not on file  Tobacco Use  . Smoking status: Current Some Day Smoker    Packs/day: 0.25    Years: 5.00    Pack years: 1.25    Types: Cigarettes  . Smokeless tobacco: Never Used  Substance and Sexual Activity  . Alcohol use: Yes    Comment: "occasional use; social drinker"  . Drug use: No  . Sexual activity: Yes    Partners: Male    Birth control/protection: None  Lifestyle  . Physical activity:    Days per week: Not on file    Minutes per session: Not on file  .  Stress: Not on file  Relationships  . Social connections:    Talks on phone: Not on file    Gets together: Not on file    Attends religious service: Not on file    Active member of club or organization: Not on file    Attends meetings of clubs or organizations: Not on file    Relationship status: Not on file  Other Topics Concern  . Not on file  Social History Narrative  . Not on file     Review of Systems: A 12 point ROS discussed and pertinent positives are indicated in the HPI above.  All other systems are negative.  Review of Systems  Constitutional: Negative for fatigue and fever.  HENT: Positive for postnasal drip.   Respiratory: Negative for cough and  shortness of breath.   Cardiovascular: Negative for chest pain.  Gastrointestinal: Negative for abdominal pain, nausea and vomiting.  Musculoskeletal: Negative for back pain.  Psychiatric/Behavioral: Negative for behavioral problems and confusion.    Vital Signs: BP (!) 136/98   Pulse 65   Temp 97.9 F (36.6 C) (Oral)   Resp 16   Ht 5\' 3"  (1.6 m)   Wt 195 lb (88.5 kg)   LMP 07/16/2017   SpO2 100%   BMI 34.54 kg/m   Physical Exam  Constitutional: She is oriented to person, place, and time. She appears well-developed.  Neck: Normal range of motion. Neck supple.  Cardiovascular: Normal rate, regular rhythm and normal heart sounds. Exam reveals no gallop and no friction rub.  No murmur heard. Pulmonary/Chest: Effort normal and breath sounds normal. No respiratory distress.  Abdominal: Soft. She exhibits no distension. There is no tenderness.  Musculoskeletal: Normal range of motion.  Neurological: She is alert and oriented to person, place, and time.  Skin: Skin is warm and dry.  Psychiatric: She has a normal mood and affect. Her behavior is normal. Judgment and thought content normal.  Nursing note and vitals reviewed.    MD Evaluation Airway: WNL Heart: WNL Abdomen: WNL Chest/ Lungs: WNL ASA  Classification: 2 Mallampati/Airway Score: One   Imaging: No results found.  Labs:  CBC: Recent Labs    04/26/17 1428 09/14/17 0630  WBC 10.9* 10.2  HGB 15.0 14.2  HCT 43.6 43.0  PLT 376 419*    COAGS: Recent Labs    09/14/17 0630  INR 0.95    BMP: Recent Labs    10/05/16 04/26/17 1428  NA  --  137  K  --  4.2  CL  --  104  CO2  --  25  GLUCOSE  --  99  BUN 15 14  CALCIUM  --  9.5  CREATININE  --  1.21*  GFRNONAA  --  59*  GFRAA  --  68    LIVER FUNCTION TESTS: Recent Labs    04/26/17 1428  BILITOT 0.2  AST 17  ALT 17  PROT 7.1    TUMOR MARKERS: No results for input(s): AFPTM, CEA, CA199, CHROMGRNA in the last 8760 hours.  Assessment  and Plan: Patient with past medical history of HTN presents with complaint of proteinuria.  IR consulted for random renal biopsy at the request of Dr. Glenna Fellows. Case reviewed by Dr. Bonnielee Haff who approves patient for procedure.  Patient presents today in their usual state of health.  She has been NPO and is not currently on blood thinners.   Risks and benefits discussed with the patient including, but not limited to bleeding, infection,  damage to adjacent structures or low yield requiring additional tests.  All of the patient's questions were answered, patient is agreeable to proceed. Consent signed and in chart.  Thank you for this interesting consult.  I greatly enjoyed meeting Selena Meyer and look forward to participating in their care.  A copy of this report was sent to the requesting provider on this date.  Electronically Signed: Hoyt Koch, PA 09/14/2017, 7:43 AM   I spent a total of  30 Minutes   in face to face in clinical consultation, greater than 50% of which was counseling/coordinating care for proteinuria.

## 2017-09-14 NOTE — Procedures (Signed)
Random Renal Cortex Core Bx 16 g R kidney EBL 0 Comp 0

## 2017-09-14 NOTE — Progress Notes (Signed)
Client asked if she could take B/P pill from home and Pam Turpin,PA notified and per Pam okay for client to take and client notified

## 2017-09-14 NOTE — Discharge Instructions (Signed)
Percutaneous Kidney Biopsy, Care After °This sheet gives you information about how to care for yourself after your procedure. Your health care provider may also give you more specific instructions. If you have problems or questions, contact your health care provider. °What can I expect after the procedure? °After the procedure, it is common to have: °Pain or soreness near the area where the needle went through your skin (biopsy site). °Bright pink or cloudy urine for 24 hours after the procedure. ° °Follow these instructions at home: °Activity °Return to your normal activities as told by your health care provider. Ask your health care provider what activities are safe for you. °Do not drive for 24 hours if you were given a medicine to help you relax (sedative). °Do not lift anything that is heavier than 10 lb (4.5 kg) until your health care provider tells you that it is safe. °Avoid activities that take a lot of effort (are strenuous) until your health care provider approves. Most people will have to wait 2 weeks before returning to activities such as exercise or sexual intercourse. °General instructions °Take over-the-counter and prescription medicines only as told by your health care provider. °You may eat and drink after your procedure. Follow instructions from your health care provider about eating or drinking restrictions. °Check your biopsy site every day for signs of infection. Check for: °More redness, swelling, or pain. °More fluid or blood. °Warmth. °Pus or a bad smell. °Keep all follow-up visits as told by your health care provider. This is important. °Contact a health care provider if: °You have more redness, swelling, or pain around your biopsy site. °You have more fluid or blood coming from your biopsy site. °Your biopsy site feels warm to the touch. °You have pus or a bad smell coming from your biopsy site. °You have blood in your urine more than 24 hours after your procedure. °Get help right away  if: °You have dark red or brown urine. °You have a fever. °You are unable to urinate. °You feel burning when you urinate. °You feel faint. °You have severe pain in your abdomen or side. °This information is not intended to replace advice given to you by your health care provider. Make sure you discuss any questions you have with your health care provider. °Document Released: 08/28/2012 Document Revised: 10/08/2015 Document Reviewed: 10/08/2015 °Elsevier Interactive Patient Education © 2018 [employer] ° °

## 2017-09-24 ENCOUNTER — Encounter (HOSPITAL_COMMUNITY): Payer: Self-pay | Admitting: Internal Medicine

## 2017-10-09 ENCOUNTER — Ambulatory Visit (INDEPENDENT_AMBULATORY_CARE_PROVIDER_SITE_OTHER): Payer: 59 | Admitting: Family Medicine

## 2017-10-09 ENCOUNTER — Encounter: Payer: Self-pay | Admitting: Family Medicine

## 2017-10-09 VITALS — BP 144/94 | HR 79 | Temp 98.6°F | Wt 192.0 lb

## 2017-10-09 DIAGNOSIS — R05 Cough: Secondary | ICD-10-CM

## 2017-10-09 DIAGNOSIS — R059 Cough, unspecified: Secondary | ICD-10-CM

## 2017-10-09 MED ORDER — AZITHROMYCIN 250 MG PO TABS: 250.0000 mg | ORAL_TABLET | Freq: Every day | ORAL | 0 refills | Status: DC

## 2017-10-09 MED ORDER — ALBUTEROL SULFATE HFA 108 (90 BASE) MCG/ACT IN AERS: 2.0000 | INHALATION_SPRAY | Freq: Four times a day (QID) | RESPIRATORY_TRACT | 0 refills | Status: DC | PRN

## 2017-10-09 MED ORDER — PREDNISONE 10 MG PO TABS: 30.0000 mg | ORAL_TABLET | Freq: Every day | ORAL | 0 refills | Status: DC

## 2017-10-09 MED ORDER — BENZONATATE 200 MG PO CAPS: 200.0000 mg | ORAL_CAPSULE | Freq: Three times a day (TID) | ORAL | 0 refills | Status: DC | PRN

## 2017-10-09 NOTE — Patient Instructions (Addendum)
Thank you for coming in today. Take prednisone and azithromycin for 5 days.  Use albuterol inhaler as needed for wheezing, shortness of breath, chest tightness or cough every 6 hours.  Use tessalon pearles (benzoate) cough pills as needed.  Continue over the counter medicine for fever, body aches.   Return for a flu vaccine during a nurse visit in a few weeks.  Or You can get it at a pharmacy.    Acute Bronchitis, Adult Acute bronchitis is sudden (acute) swelling of the air tubes (bronchi) in the lungs. Acute bronchitis causes these tubes to fill with mucus, which can make it hard to breathe. It can also cause coughing or wheezing. In adults, acute bronchitis usually goes away within 2 weeks. A cough caused by bronchitis may last up to 3 weeks. Smoking, allergies, and asthma can make the condition worse. Repeated episodes of bronchitis may cause further lung problems, such as chronic obstructive pulmonary disease (COPD). What are the causes? This condition can be caused by germs and by substances that irritate the lungs, including:  Cold and flu viruses. This condition is most often caused by the same virus that causes a cold.  Bacteria.  Exposure to tobacco smoke, dust, fumes, and air pollution.  What increases the risk? This condition is more likely to develop in people who:  Have close contact with someone with acute bronchitis.  Are exposed to lung irritants, such as tobacco smoke, dust, fumes, and vapors.  Have a weak immune system.  Have a respiratory condition such as asthma.  What are the signs or symptoms? Symptoms of this condition include:  A cough.  Coughing up clear, yellow, or green mucus.  Wheezing.  Chest congestion.  Shortness of breath.  A fever.  Body aches.  Chills.  A sore throat.  How is this diagnosed? This condition is usually diagnosed with a physical exam. During the exam, your health care provider may order tests, such as chest X-rays,  to rule out other conditions. He or she may also:  Test a sample of your mucus for bacterial infection.  Check the level of oxygen in your blood. This is done to check for pneumonia.  Do a chest X-ray or lung function testing to rule out pneumonia and other conditions.  Perform blood tests.  Your health care provider will also ask about your symptoms and medical history. How is this treated? Most cases of acute bronchitis clear up over time without treatment. Your health care provider may recommend:  Drinking more fluids. Drinking more makes your mucus thinner, which may make it easier to breathe.  Taking a medicine for a fever or cough.  Taking an antibiotic medicine.  Using an inhaler to help improve shortness of breath and to control a cough.  Using a cool mist vaporizer or humidifier to make it easier to breathe.  Follow these instructions at home: Medicines  Take over-the-counter and prescription medicines only as told by your health care provider.  If you were prescribed an antibiotic, take it as told by your health care provider. Do not stop taking the antibiotic even if you start to feel better. General instructions  Get plenty of rest.  Drink enough fluids to keep your urine clear or pale yellow.  Avoid smoking and secondhand smoke. Exposure to cigarette smoke or irritating chemicals will make bronchitis worse. If you smoke and you need help quitting, ask your health care provider. Quitting smoking will help your lungs heal faster.  Use an inhaler,  cool mist vaporizer, or humidifier as told by your health care provider.  Keep all follow-up visits as told by your health care provider. This is important. How is this prevented? To lower your risk of getting this condition again:  Wash your hands often with soap and water. If soap and water are not available, use hand sanitizer.  Avoid contact with people who have cold symptoms.  Try not to touch your hands to  your mouth, nose, or eyes.  Make sure to get the flu shot every year.  Contact a health care provider if:  Your symptoms do not improve in 2 weeks of treatment. Get help right away if:  You cough up blood.  You have chest pain.  You have severe shortness of breath.  You become dehydrated.  You faint or keep feeling like you are going to faint.  You keep vomiting.  You have a severe headache.  Your fever or chills gets worse. This information is not intended to replace advice given to you by your health care provider. Make sure you discuss any questions you have with your health care provider. Document Released: 02/03/2004 Document Revised: 07/21/2015 Document Reviewed: 06/16/2015 Elsevier Interactive Patient Education  2018 [employer].  Albuterol inhalation solution What is this medicine? ALBUTEROL (al Gaspar Bidding) is a bronchodilator. It helps to open up the airways in your lungs to make it easier to breathe. This medicine is used to treat and to prevent bronchospasm. This medicine may be used for other purposes; ask your health care provider or pharmacist if you have questions. COMMON BRAND NAME(S): Accuneb, Proventil What should I tell my health care provider before I take this medicine? They need to know if you have any of the following conditions: -diabetes -heart disease or irregular heartbeat -high blood pressure -pheochromocytoma -seizures -thyroid disease -an unusual or allergic reaction to albuterol, levalbuterol, sulfites, other medicines, foods, dyes, or preservatives -pregnant or trying to get pregnant -breast-feeding How should I use this medicine? This medicine is used in a nebulizer. Nebulizers make a liquid into an aerosol that you breathe in through your mouth or your mouth and nose into your lungs. You will be taught how to use your nebulizer. Follow the directions on your prescription label. Take your medicine at regular intervals. Do not use more  often than directed. Talk to your pediatrician regarding the use of this medicine in children. Special care may be needed. Overdosage: If you think you have taken too much of this medicine contact a poison control center or emergency room at once. NOTE: This medicine is only for you. Do not share this medicine with others. What if I miss a dose? If you miss a dose, use it as soon as you can. If it is almost time for your next dose, use only that dose. Do not use double or extra doses. What may interact with this medicine? -anti-infectives like chloroquine and pentamidine -caffeine -cisapride -diuretics -medicines for colds -medicines for depression or emotional or psychotic conditions -medicines for weight loss including some herbal products -methadone -some antibiotics like clarithromycin, erythromycin, levofloxacin, and linezolid -some heart medicines -steroid hormones like dexamethasone, cortisone, hydrocortisone -theophylline -thyroid hormones This list may not describe all possible interactions. Give your health care provider a list of all the medicines, herbs, non-prescription drugs, or dietary supplements you use. Also tell them if you smoke, drink alcohol, or use illegal drugs. Some items may interact with your medicine. What should I watch for while using  this medicine? Tell your doctor or health care professional if your symptoms do not improve. Do not use extra albuterol. Call your doctor right away if your asthma or bronchitis gets worse while you are using this medicine. If your mouth gets dry try chewing sugarless gum or sucking hard candy. Drink water as directed. What side effects may I notice from receiving this medicine? Side effects that you should report to your doctor or health care professional as soon as possible: -allergic reactions like skin rash, itching or hives, swelling of the face, lips, or tongue -breathing problems -chest pain -feeling faint or  lightheaded, falls -high blood pressure -irregular heartbeat -fever -muscle cramps or weakness -pain, tingling, numbness in the hands or feet -vomiting Side effects that usually do not require medical attention (report to your doctor or health care professional if they continue or are bothersome): -cough -difficulty sleeping -headache -nervousness, trembling -stomach upset -stuffy or runny nose -throat irritation -unusual taste This list may not describe all possible side effects. Call your doctor for medical advice about side effects. You may report side effects to FDA at 1-800-FDA-1088. Where should I keep my medicine? Keep out of the reach of children. Store between 2 and 25 degrees C (36 and 77 degrees F). Do not freeze. Protect from light. Throw away any unused medicine after the expiration date. Most products are kept in the foil package until time of use. Some products can be used up to 1 week after they are removed from the foil pouch. Check the instructions that come with your medicine. NOTE: This sheet is a summary. It may not cover all possible information. If you have questions about this medicine, talk to your doctor, pharmacist, or health care provider.  2018 Elsevier/Gold Standard (2010-09-16 15:19:55)

## 2017-10-09 NOTE — Progress Notes (Signed)
Selena Meyer is a 32 y.o. female who presents to [facility]: Primary Care Sports Medicine today for cough, wheezing.   She has had a 3 day history of cough, wheezing, and body aches. 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 She resolved between each episode of being sick however. Her primary symptom is coughing which keeps her up at night and causes her to wheeze and she vomited x1 yesterday while coughing. She says her temperature 2 days ago was 32 F. She has chest pain and headache with her coughing spells. She denies a history of asthma but does say that she smokes sometimes. She usually has seasonal allergies but this is not what they normally feel like and she is not taking any allergy medicine. She has not yet had the flu vaccine.   Of note, she says that one of the kids at her day care was recently diagnosed with whooping cough. She was not 100% confident of the status of this child, but says he is at home being cared for. She says another child was diagnosed with croup.    ROS as above:  Exam:  BP (!) 144/94   Pulse 79   Temp 98.6 F (37 C) (Oral)   Wt 192 lb (87.1 kg)   SpO2 100%   BMI 34.01 kg/m  Wt Readings from Last 5 Encounters:  10/09/17 192 lb (87.1 kg)  09/14/17 195 lb (88.5 kg)  09/04/17 195 lb 11.2 oz (88.8 kg)  08/06/17 196 lb 4.8 oz (89 kg)  07/18/17 196 lb (88.9 kg)    Gen: Well NAD, wearing a mask, coughing  HEENT: EOMI,  MMM, throat erythematous with evidence of nasal drainage.  Lungs: Normal work of breathing. CTABL. No audible wheezes.  Heart: RRR no MRG Abd: NABS, Soft. Nondistended, Nontender Exts: Brisk capillary refill, warm and well perfused.   Lab and Radiology Results No results found for this or any previous visit (from the past 72 hour(s)). No results found.    Assessment and Plan: 32  y.o. female with cough and body aches. I do not think that she has the flu. She has not had a fever and the waxing and waning symptoms suggest multiple infections. She likely has viral bronchitis. She will start taking benzonatate, prednisone, and an albuterol inhaler as needed for her cough symptoms. She should continue to take tylenol or ibuprofen for pain or fever control. She will also take a 5 day course of Azithromycin in case she is in the prodromal phase of a pertussis infection, although this is unlikely.   She did not get her influenza vaccine due to her acute illness, but she should get it in the near future when she is feeling better.    No orders of the defined types were placed in this encounter.  Meds ordered this encounter  Medications  . albuterol (PROVENTIL HFA;VENTOLIN HFA) 108 (90 Base) MCG/ACT inhaler    Sig: Inhale 2 puffs into the lungs every 6 (six) hours as needed for wheezing or shortness of breath.    Dispense:  1 Inhaler    Refill:  0  . predniSONE (DELTASONE) 10 MG tablet    Sig: Take 3 tablets (30 mg total) by mouth daily with breakfast.    Dispense:  15 tablet    Refill:  0  . azithromycin (ZITHROMAX) 250 MG tablet  Sig: Take 1 tablet (250 mg total) by mouth daily. Take first 2 tablets together, then 1 every day until finished.    Dispense:  6 tablet    Refill:  0  . benzonatate (TESSALON) 200 MG capsule    Sig: Take 1 capsule (200 mg total) by mouth 3 (three) times daily as needed for cough.    Dispense:  45 capsule    Refill:  0     Historical information moved to improve visibility of documentation.  Past Medical History:  Diagnosis Date  . Bronchitis   . Hypertension   . Irregular menstrual cycle   . Irregular periods/menstrual cycles 05/03/2017   Will hold off on Provera or other fertility stuff for now, pt advised pregnancy is not recommended until BP under better control at least, plus other workup pen  . Proteinuria 02/28/2013  .  Uncontrolled hypertension 05/10/2017   No past surgical history on file. Social History   Tobacco Use  . Smoking status: Current Some Day Smoker    Packs/day: 0.25    Years: 5.00    Pack years: 1.25    Types: Cigarettes  . Smokeless tobacco: Never Used  Substance Use Topics  . Alcohol use: Yes    Comment: "occasional use; social drinker"   family history includes Cancer in her maternal aunt and maternal grandmother; Diabetes in her maternal grandmother, mother, and sister; High blood pressure in her maternal grandmother and mother.  Medications: Current Outpatient Medications  Medication Sig Dispense Refill  . acetaminophen (TYLENOL) 500 MG tablet Take 1,000 mg by mouth every 6 (six) hours as needed for mild pain.     [name] amLODipine (NORVASC) 10 MG tablet Take 1 tablet (10 mg total) by mouth daily. 30 tablet 1  . butalbital-acetaminophen-caffeine (FIORICET, ESGIC) 50-325-40 MG tablet Take 1 tablet by mouth every 4 (four) hours as needed for headache. Max 2 pills in 24 hours, max use 5 days per month 20 tablet 0  . hydrochlorothiazide (HYDRODIURIL) 25 MG tablet TAKE 1 TABLET BY MOUTH ONCE DAILY 30 tablet 1  . metoprolol succinate (TOPROL-XL) 200 MG 24 hr tablet Take 1 tablet (200 mg total) by mouth daily. Take with or immediately following a meal. 30 tablet 3  . Multiple Vitamins-Minerals (MULTIVITAMIN ADULT) CHEW Chew 2 each by mouth daily.    [name] albuterol (PROVENTIL HFA;VENTOLIN HFA) 108 (90 Base) MCG/ACT inhaler Inhale 2 puffs into the lungs every 6 (six) hours as needed for wheezing or shortness of breath. 1 Inhaler 0  . azithromycin (ZITHROMAX) 250 MG tablet Take 1 tablet (250 mg total) by mouth daily. Take first 2 tablets together, then 1 every day until finished. 6 tablet 0  . benzonatate (TESSALON) 200 MG capsule Take 1 capsule (200 mg total) by mouth 3 (three) times daily as needed for cough. 45 capsule 0  . predniSONE (DELTASONE) 10 MG tablet Take 3 tablets (30 mg total) by mouth  daily with breakfast. 15 tablet 0   No current facility-administered medications for this visit.    Allergies  Allergen Reactions  . Ibuprofen Hives     Discussed warning signs or symptoms. Please see discharge instructions. Patient expresses understanding.  I personally was present and performed or re-performed the history, physical exam and medical decision-making activities of this service and have verified that the service and findings are accurately documented in the student's note. ___________________________________________ Clementeen Graham M.D., ABFM., CAQSM. Primary Care and Sports Medicine Adjunct Instructor of Family Medicine  [employer]  [employer]

## 2017-10-18 ENCOUNTER — Other Ambulatory Visit: Payer: Self-pay | Admitting: Family Medicine

## 2017-10-18 ENCOUNTER — Other Ambulatory Visit: Payer: Self-pay | Admitting: Sports Medicine

## 2017-10-18 DIAGNOSIS — I10 Essential (primary) hypertension: Secondary | ICD-10-CM

## 2017-10-18 NOTE — Telephone Encounter (Signed)
To PCP

## 2017-10-25 ENCOUNTER — Other Ambulatory Visit: Payer: Self-pay | Admitting: Family Medicine

## 2017-10-25 ENCOUNTER — Other Ambulatory Visit: Payer: Self-pay | Admitting: Sports Medicine

## 2017-10-25 DIAGNOSIS — I10 Essential (primary) hypertension: Secondary | ICD-10-CM

## 2017-10-31 ENCOUNTER — Other Ambulatory Visit: Payer: Self-pay

## 2017-10-31 DIAGNOSIS — I10 Essential (primary) hypertension: Secondary | ICD-10-CM

## 2017-10-31 MED ORDER — HYDROCHLOROTHIAZIDE 25 MG PO TABS: 25.0000 mg | ORAL_TABLET | Freq: Every day | ORAL | 0 refills | Status: DC

## 2017-12-31 ENCOUNTER — Encounter: Payer: Self-pay | Admitting: *Deleted

## 2017-12-31 ENCOUNTER — Emergency Department (INDEPENDENT_AMBULATORY_CARE_PROVIDER_SITE_OTHER)
Admission: EM | Admit: 2017-12-31 | Discharge: 2017-12-31 | Disposition: A | Discharge status: Home / Self Care | Payer: 59 | Source: Home / Self Care | Attending: Internal Medicine | Admitting: Internal Medicine

## 2017-12-31 DIAGNOSIS — J181 Lobar pneumonia, unspecified organism: Secondary | ICD-10-CM

## 2017-12-31 DIAGNOSIS — J189 Pneumonia, unspecified organism: Secondary | ICD-10-CM

## 2017-12-31 MED ORDER — HYDROCOD POLST-CPM POLST ER 10-8 MG/5ML PO SUER: 5.0000 mL | Freq: Every evening | ORAL | 0 refills | Status: DC | PRN

## 2017-12-31 MED ORDER — AMOXICILLIN-POT CLAVULANATE 875-125 MG PO TABS: 1.0000 | ORAL_TABLET | Freq: Two times a day (BID) | ORAL | 0 refills | Status: DC

## 2017-12-31 MED ORDER — PREDNISONE 10 MG (21) PO TBPK: ORAL_TABLET | Freq: Every day | ORAL | 0 refills | Status: DC

## 2017-12-31 MED ORDER — ALBUTEROL SULFATE HFA 108 (90 BASE) MCG/ACT IN AERS: 2.0000 | INHALATION_SPRAY | Freq: Four times a day (QID) | RESPIRATORY_TRACT | 1 refills | Status: DC | PRN

## 2017-12-31 MED ORDER — AZITHROMYCIN 250 MG PO TABS: 250.0000 mg | ORAL_TABLET | Freq: Every day | ORAL | 0 refills | Status: DC

## 2017-12-31 NOTE — ED Provider Notes (Signed)
[facility]    CSN: [id number] Arrival date & time: 12/31/17  1117     History   Chief Complaint Chief Complaint  Patient presents with  . Cough  . Fever  . Generalized Body Aches    HPI Selena Meyer is a 32 y.o. female.   32-year-old female with past medical history of hypertension presents to urgent care complaining of cough, fever and body aches.  The patient began feeling ill 3 days ago.  She reports that her cough is painful but that she can barely bring anything up.  When she can produce some sputum it is thick and dark yellow.  She had to use her inhaler 2 days ago for the first time in a few months.  She denies chest pain, nausea, vomiting or diarrhea.     Past Medical History:  Diagnosis Date  . Bronchitis   . Hypertension   . Irregular menstrual cycle   . Irregular periods/menstrual cycles 05/03/2017   Will hold off on Provera or other fertility stuff for now, pt advised pregnancy is not recommended until BP under better control at least, plus other workup pen  . Proteinuria 02/28/2013  . Uncontrolled hypertension 05/10/2017    Patient Active Problem List   Diagnosis Date Noted  . Episodic tension-type headache, not intractable 08/06/2017  . Infertility counseling 08/06/2017  . Systolic murmur 07/16/2017  . Uncontrolled hypertension 05/10/2017  . Irregular periods/menstrual cycles 05/03/2017  . Hypertensive urgency 02/28/2013  . Tobacco abuse 02/28/2013  . Proteinuria 02/28/2013    History reviewed. No pertinent surgical history.  OB History   No obstetric history on file.      Home Medications    Prior to Admission medications   Medication Sig Start Date End Date Taking? Authorizing Provider  amLODipine (NORVASC) 10 MG tablet TAKE 1 TABLET BY MOUTH ONCE DAILY 10/26/17  Yes Monica Bectonhekkekandam, Thomas J, MD  hydrochlorothiazide (HYDRODIURIL) 25 MG tablet Take 1 tablet (25 mg total) by mouth daily. 10/31/17  Yes Sunnie NielsenAlexander, Natalie, DO    metoprolol succinate (TOPROL-XL) 200 MG 24 hr tablet Take 1 tablet (200 mg total) by mouth daily. Take with or immediately following a meal. 07/16/17  Yes Monica Bectonhekkekandam, Thomas J, MD  Multiple Vitamins-Minerals (MULTIVITAMIN ADULT) CHEW Chew 2 each by mouth daily.   Yes [provider]  acetaminophen (TYLENOL) 500 MG tablet Take 1,000 mg by mouth every 6 (six) hours as needed for mild pain.     [provider]  albuterol (PROVENTIL HFA;VENTOLIN HFA) 108 (90 Base) MCG/ACT inhaler Inhale 2 puffs into the lungs every 6 (six) hours as needed for wheezing or shortness of breath. 12/31/17   Arnaldo Nataliamond, Jidenna Figgs S, MD  amoxicillin-clavulanate (AUGMENTIN) 875-125 MG tablet Take 1 tablet by mouth every 12 (twelve) hours. 12/31/17   Arnaldo Nataliamond, Jeffren Dombek S, MD  azithromycin (ZITHROMAX) 250 MG tablet Take 1 tablet (250 mg total) by mouth daily. Take first 2 tablets together, then 1 every day until finished. 12/31/17   Arnaldo Nataliamond, Marquavius Scaife S, MD  butalbital-acetaminophen-caffeine (FIORICET, ESGIC) [phone] MG tablet Take 1 tablet by mouth every 4 (four) hours as needed for headache. Max 2 pills in 24 hours, max use 5 days per month 05/10/17 05/10/18  Sunnie NielsenAlexander, Natalie, DO  chlorpheniramine-HYDROcodone [facility](TUSSIONEX PENNKINETIC ER) 10-8 MG/5ML SUER Take 5 mLs by mouth at bedtime as needed for cough. 12/31/17   Arnaldo Nataliamond, Tryton Bodi S, MD  predniSONE (STERAPRED UNI-PAK 21 TAB) 10 MG (21) TBPK tablet Take by mouth daily. Take 6 tabs by  mouth daily  for 2 days, then 5 tabs for 2 days, then 4 tabs for 2 days, then 3 tabs for 2 days, 2 tabs for 2 days, then 1 tab by mouth daily for 2 days 12/31/17   Arnaldo Natal, MD    Family History Family History  Problem Relation Age of Onset  . Diabetes Mother   . High blood pressure Mother   . Renal Disease Mother   . Diabetes Sister   . Cancer Maternal Grandmother   . Diabetes Maternal Grandmother   . High blood pressure Maternal Grandmother   . Cancer Maternal Aunt         breast  . Other Father     Social History Social History   Tobacco Use  . Smoking status: Current Some Day Smoker    Packs/day: 0.25    Years: 5.00    Pack years: 1.25    Types: Cigarettes  . Smokeless tobacco: Never Used  Substance Use Topics  . Alcohol use: Yes    Comment: "occasional use; social drinker"  . Drug use: No     Allergies   Ibuprofen   Review of Systems Review of Systems  Constitutional: Negative for chills and fever.  HENT: Negative for sore throat and tinnitus.   Eyes: Negative for redness.  Respiratory: Positive for cough and shortness of breath.   Cardiovascular: Negative for chest pain and palpitations.  Gastrointestinal: Negative for abdominal pain, diarrhea, nausea and vomiting.  Genitourinary: Negative for dysuria, frequency and urgency.  Musculoskeletal: Negative for myalgias.  Skin: Negative for rash.       No lesions  Neurological: Negative for weakness.  Hematological: Does not bruise/bleed easily.  Psychiatric/Behavioral: Negative for suicidal ideas.     Physical Exam Triage Vital Signs ED Triage Vitals  Enc Vitals Group     BP 12/31/17 1157 (!) 155/108     Pulse Rate 12/31/17 1157 (!) 110     Resp 12/31/17 1157 16     Temp 12/31/17 1157 98.4 F (36.9 C)     Temp Source 12/31/17 1157 Oral     SpO2 12/31/17 1157 99 %     Weight 12/31/17 1158 200 lb (90.7 kg)     Height 12/31/17 1158 5\' 5"  (1.651 m)     Head Circumference --      Peak Flow --      Pain Score 12/31/17 1158 0     Pain Loc --      Pain Edu? --      Excl. in GC? --    No data found.  Updated Vital Signs BP (!) 155/108 (BP Location: Right Arm)   Pulse (!) 110   Temp 98.4 F (36.9 C) (Oral)   Resp 16   Ht 5\' 5"  (1.651 m)   Wt 90.7 kg   SpO2 99%   BMI 33.28 kg/m   Visual Acuity Right Eye Distance:   Left Eye Distance:   Bilateral Distance:    Right Eye Near:   Left Eye Near:    Bilateral Near:     Physical Exam Vitals signs and nursing note  reviewed.  Constitutional:      General: She is not in acute distress.    Appearance: She is well-developed.  HENT:     Head: Normocephalic and atraumatic.  Eyes:     General: No scleral icterus.    Conjunctiva/sclera: Conjunctivae normal.     Pupils: Pupils are equal, round, and reactive to light.  Neck:     Musculoskeletal: Normal range of motion and neck supple.     Thyroid: No thyromegaly.     Vascular: No JVD.     Trachea: No tracheal deviation.  Cardiovascular:     Rate and Rhythm: Normal rate and regular rhythm.     Heart sounds: Normal heart sounds. No murmur. No friction rub. No gallop.   Pulmonary:     Effort: Pulmonary effort is normal.     Breath sounds: Examination of the right-lower field reveals decreased breath sounds. Decreased breath sounds present.  Abdominal:     General: Bowel sounds are normal. There is no distension.     Palpations: Abdomen is soft.     Tenderness: There is no abdominal tenderness.  Musculoskeletal: Normal range of motion.  Lymphadenopathy:     Cervical: No cervical adenopathy.  Skin:    General: Skin is warm and dry.  Neurological:     Mental Status: She is alert and oriented to person, place, and time.     Cranial Nerves: No cranial nerve deficit.  Psychiatric:        Behavior: Behavior normal.        Thought Content: Thought content normal.        Judgment: Judgment normal.      UC Treatments / Results  Labs (all labs ordered are listed, but only abnormal results are displayed) Labs Reviewed - No data to display  EKG None  Radiology No results found.  Procedures Procedures (including critical care time)  Medications Ordered in UC Medications - No data to display  Initial Impression / Assessment and Plan / UC Course  I have reviewed the triage vital signs and the nursing notes.  Pertinent labs & imaging results that were available during my care of the patient were reviewed by me and considered in my medical  decision making (see chart for details).     Creased breath sounds right base.  She may have underlying asthma due to her recurrent episodes of shortness of breath and cough.  Empirically treat for pneumonia.  Final Clinical Impressions(s) / UC Diagnoses   Final diagnoses:  Community acquired pneumonia of right lower lobe of lung [facility](HCC)   Discharge Instructions   None    ED Prescriptions    Medication Sig Dispense Auth. Provider   albuterol (PROVENTIL HFA;VENTOLIN HFA) 108 (90 Base) MCG/ACT inhaler Inhale 2 puffs into the lungs every 6 (six) hours as needed for wheezing or shortness of breath. 1 Inhaler Arnaldo Nataliamond, Aimie Wagman S, MD   predniSONE (STERAPRED UNI-PAK 21 TAB) 10 MG (21) TBPK tablet Take by mouth daily. Take 6 tabs by mouth daily  for 2 days, then 5 tabs for 2 days, then 4 tabs for 2 days, then 3 tabs for 2 days, 2 tabs for 2 days, then 1 tab by mouth daily for 2 days 42 tablet Arnaldo Nataliamond, Amanpreet Delmont S, MD   chlorpheniramine-HYDROcodone [facility](TUSSIONEX PENNKINETIC ER) 10-8 MG/5ML SUER Take 5 mLs by mouth at bedtime as needed for cough. 70 mL Arnaldo Nataliamond, Madalaine Portier S, MD   amoxicillin-clavulanate (AUGMENTIN) 875-125 MG tablet Take 1 tablet by mouth every 12 (twelve) hours. 14 tablet Arnaldo Nataliamond, Magalene Mclear S, MD   azithromycin (ZITHROMAX) 250 MG tablet Take 1 tablet (250 mg total) by mouth daily. Take first 2 tablets together, then 1 every day until finished. 6 tablet Arnaldo Nataliamond, Davina Howlett S, MD     Controlled Substance Prescriptions [place] Controlled Substance Registry consulted? Not Applicable   Arnaldo Nataliamond, Jessica Checketts S, MD 12/31/17  1232  

## 2017-12-31 NOTE — ED Triage Notes (Signed)
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 Onset of productive cough- green, intermittent fever, body aches, and fatigue Saturday.

## 2018-01-03 ENCOUNTER — Encounter: Payer: Self-pay | Admitting: Osteopathic Medicine

## 2018-01-03 ENCOUNTER — Ambulatory Visit (INDEPENDENT_AMBULATORY_CARE_PROVIDER_SITE_OTHER): Payer: 59 | Admitting: Osteopathic Medicine

## 2018-01-03 VITALS — BP 170/109 | HR 101 | Temp 98.0°F | Wt 182.6 lb

## 2018-01-03 DIAGNOSIS — I10 Essential (primary) hypertension: Secondary | ICD-10-CM

## 2018-01-03 DIAGNOSIS — J209 Acute bronchitis, unspecified: Secondary | ICD-10-CM | POA: Diagnosis not present

## 2018-01-03 MED ORDER — IPRATROPIUM BROMIDE 0.06 % NA SOLN: 2.0000 | Freq: Four times a day (QID) | NASAL | 1 refills | Status: DC

## 2018-01-03 NOTE — Progress Notes (Signed)
HPI: Toney RakesLatoya A Hogle is a 32 y.o. female who  has a past medical history of Bronchitis, Hypertension, Irregular menstrual cycle, Irregular periods/menstrual cycles (05/03/2017), Proteinuria (02/28/2013), and Uncontrolled hypertension (05/10/2017).  [facility], 01/03/18,  for chief complaint of: Pneumonia follow-up  Seen in Urgent Care 3 days ago, cough fever and body aches x3 days. Empiric treatment for PNA given decreased breath sounds. Rx albuterol, prednisone 6 day burst, tussionex, augmentin + azithromycin.   BP elevated, reports taking all meds as usual. No CP/SOB, no HA/VC, no dizziness.   Cough is worst symptom at this point but it's getting better than it was. NO history of asthma. Reports sinus pressure and R ear pressure worse than when illness started.     Past medical, surgical, social and family history reviewed:  Patient Active Problem List   Diagnosis Date Noted  . Episodic tension-type headache, not intractable 08/06/2017  . Infertility counseling 08/06/2017  . Systolic murmur 07/16/2017  . Uncontrolled hypertension 05/10/2017  . Irregular periods/menstrual cycles 05/03/2017  . Hypertensive urgency 02/28/2013  . Tobacco abuse 02/28/2013  . Proteinuria 02/28/2013    No past surgical history on file.  Social History   Tobacco Use  . Smoking status: Current Some Day Smoker    Packs/day: 0.25    Years: 5.00    Pack years: 1.25    Types: Cigarettes  . Smokeless tobacco: Never Used  Substance Use Topics  . Alcohol use: Yes    Comment: "occasional use; social drinker"    Family History  Problem Relation Age of Onset  . Diabetes Mother   . High blood pressure Mother   . Renal Disease Mother   . Diabetes Sister   . Cancer Maternal Grandmother   . Diabetes Maternal Grandmother   . High blood pressure Maternal Grandmother   . Cancer Maternal Aunt        breast  . Other Father      Current medication  list and allergy/intolerance information reviewed:    Current Outpatient Medications  Medication Sig Dispense Refill  . acetaminophen (TYLENOL) 500 MG tablet Take 1,000 mg by mouth every 6 (six) hours as needed for mild pain.     [name]. albuterol (PROVENTIL HFA;VENTOLIN HFA) 108 (90 Base) MCG/ACT inhaler Inhale 2 puffs into the lungs every 6 (six) hours as needed for wheezing or shortness of breath. 1 Inhaler 1  . amLODipine (NORVASC) 10 MG tablet TAKE 1 TABLET BY MOUTH ONCE DAILY 30 tablet 1  . amoxicillin-clavulanate (AUGMENTIN) 875-125 MG tablet Take 1 tablet by mouth every 12 (twelve) hours. 14 tablet 0  . azithromycin (ZITHROMAX) 250 MG tablet Take 1 tablet (250 mg total) by mouth daily. Take first 2 tablets together, then 1 every day until finished. 6 tablet 0  . butalbital-acetaminophen-caffeine (FIORICET, ESGIC) 50-325-40 MG tablet Take 1 tablet by mouth every 4 (four) hours as needed for headache. Max 2 pills in 24 hours, max use 5 days per month 20 tablet 0  . chlorpheniramine-HYDROcodone (TUSSIONEX PENNKINETIC ER) 10-8 MG/5ML SUER Take 5 mLs by mouth at bedtime as needed for cough. 70 mL 0  . hydrochlorothiazide (HYDRODIURIL) 25 MG tablet Take 1 tablet (25 mg total) by mouth daily. 90 tablet 0  . metoprolol succinate (TOPROL-XL) 200 MG 24 hr tablet Take 1 tablet (200 mg total) by mouth daily. Take with or immediately following a meal. 30 tablet 3  . Multiple Vitamins-Minerals (MULTIVITAMIN ADULT) CHEW Chew 2 each by  mouth daily.    . predniSONE (STERAPRED UNI-PAK 21 TAB) 10 MG (21) TBPK tablet Take by mouth daily. Take 6 tabs by mouth daily  for 2 days, then 5 tabs for 2 days, then 4 tabs for 2 days, then 3 tabs for 2 days, 2 tabs for 2 days, then 1 tab by mouth daily for 2 days 42 tablet 0   No current facility-administered medications for this visit.     Allergies  Allergen Reactions  . Ibuprofen Hives      Review of Systems:  Constitutional:  +fever, no chills, +recent illness, No  unintentional weight changes. +significant fatigue.   HEENT: No  headache, no vision change, no hearing change, No sore throat, +sinus pressure  Cardiac: No  chest pain, No  pressure, No palpitations, No  Orthopnea  Respiratory:  No  shortness of breath. +Cough  Gastrointestinal: No  abdominal pain, No  nausea, No  vomiting,  No  blood in stool, No  diarrhea, No  constipation   Musculoskeletal: No new myalgia/arthralgia   Exam:  BP (!) 170/109 (BP Location: Left Arm, Patient Position: Sitting, Cuff Size: Normal)   Pulse (!) 101   Temp 98 F (36.7 C) (Oral)   Wt 182 lb 9.6 oz (82.8 kg)   SpO2 99%   BMI 30.39 kg/m   Constitutional: VS see above. General Appearance: alert, well-developed, well-nourished, NAD  Eyes: Normal lids and conjunctive, non-icteric sclera  Ears, Nose, Mouth, Throat: MMM, Normal external inspection ears/nares/mouth/lips/gums. TM normal bilaterally. Pharynx/tonsils no erythema, no exudate. Nasal mucosa normal.   Neck: No masses, trachea midline. No thyroid enlargement. No tenderness/mass appreciated. No lymphadenopathy  Respiratory: Normal respiratory effort. +diffuse wheeze, no rhonchi, no rales. NO diminished breath sounds (see previous exam from UC)   Cardiovascular: S1/S2 normal, no murmur, no rub/gallop auscultated. RRR. No lower extremity edema.    Musculoskeletal: Gait normal. No clubbing/cyanosis of digits.   Neurological: Normal balance/coordination. No tremor.   Skin: warm, dry, intact. No rash/ulcer.[name]    Psychiatric: Normal judgment/insight. Normal mood and affect. Oriented x3.      ASSESSMENT/PLAN: The primary encounter diagnosis was Acute bronchitis, unspecified organism. A diagnosis of Uncontrolled hypertension was also pertinent to this visit.  Meds ordered this encounter  Medications  . ipratropium (ATROVENT) 0.06 % nasal spray    Sig: Place 2 sprays into both nostrils 4 (four) times daily.    Dispense:  15 mL    Refill:  1     Patient Instructions  Bronchitis is the likely reason for your symptoms. I'd continue the antibiotics, cough medicine and steroids, use inhaler as needed. Can take medications as below. If blood pressure isn't improving after illness, please come see me!   Medications & Home Remedies for Upper Respiratory Illness   Aches/Pains, Fever, Headache OTC Acetaminophen (Tylenol) 500 mg tablets - take max 2 tablets (1000 mg) every 6 hours (4 times per day)    Sinus Congestion Prescription Atrovent as directed OTC Nasal Saline if desired to rinse OTC Oxymetolazone (Afrin, others) sparing use due to rebound congestion, NEVER use in kids OTC Phenylephrine (Sudafed) 10 mg tablets every 4 hours (or the 12-hour formulation)* - avoid in high blood pressure!  OTC Diphenhydramine (Benadryl) 25 mg tablets - take max 2 tablets every 4 hours   Cough & Sore Throat Prescription cough pills or syrups as directed OTC Dextromethorphan (Robitussin, others) - cough suppressant OTC Guaifenesin (Robitussin, Mucinex, others) - expectorant (helps cough up mucus) (Dextromethorphan and Guaifenesin also  come in a combination tablet/syrup) OTC Lozenges w/ Benzocaine + Menthol (Cepacol) Honey - as much as you want! Teas which "coat the throat" - look for ingredients Elm Bark, Licorice Root, Marshmallow Root   Other Prescription Oral Steroids to decrease inflammation Prescription Antibiotics if these are necessary for bacterial infection - take ALL, even if you're feeling better  OTC Zinc Lozenges within 24 hours of symptoms onset - mixed evidence this shortens the duration of the common cold Don't waste your money on Vitamin C or Echinacea in acute illness - it's already too late!           Visit summary with medication list and pertinent instructions was printed for patient to review. All questions at time of visit were answered - patient instructed to contact office with any additional concerns or updates.  ER/RTC precautions were reviewed with the patient.    Please note: voice recognition software was used to produce this document, and typos may escape review. Please contact Dr. Lyn HollingsheadAlexander for any needed clarifications.     Follow-up plan: Return if symptoms worsen or fail to improve.

## 2018-01-03 NOTE — Patient Instructions (Addendum)
Bronchitis is the likely reason for your symptoms. I'd continue the antibiotics, cough medicine and steroids, use inhaler as needed. Can take medications as below. If blood pressure isn't improving after illness, please come see me!   Medications & Home Remedies for Upper Respiratory Illness   Aches/Pains, Fever, Headache OTC Acetaminophen (Tylenol) 500 mg tablets - take max 2 tablets (1000 mg) every 6 hours (4 times per day)    Sinus Congestion Prescription Atrovent as directed OTC Nasal Saline if desired to rinse OTC Oxymetolazone (Afrin, others) sparing use due to rebound congestion, NEVER use in kids OTC Phenylephrine (Sudafed) 10 mg tablets every 4 hours (or the 12-hour formulation)* - avoid in high blood pressure!  OTC Diphenhydramine (Benadryl) 25 mg tablets - take max 2 tablets every 4 hours   Cough & Sore Throat Prescription cough pills or syrups as directed OTC Dextromethorphan (Robitussin, others) - cough suppressant OTC Guaifenesin (Robitussin, Mucinex, others) - expectorant (helps cough up mucus) (Dextromethorphan and Guaifenesin also come in a combination tablet/syrup) OTC Lozenges w/ Benzocaine + Menthol (Cepacol) Honey - as much as you want! Teas which "coat the throat" - look for ingredients Elm Bark, Licorice Root, Marshmallow Root   Other Prescription Oral Steroids to decrease inflammation Prescription Antibiotics if these are necessary for bacterial infection - take ALL, even if you're feeling better  OTC Zinc Lozenges within 24 hours of symptoms onset - mixed evidence this shortens the duration of the common cold Don't waste your money on Vitamin C or Echinacea in acute illness - it's already too late!

## 2018-02-01 ENCOUNTER — Encounter: Payer: Self-pay | Admitting: Family Medicine

## 2018-02-01 ENCOUNTER — Other Ambulatory Visit: Payer: Self-pay

## 2018-02-01 ENCOUNTER — Emergency Department (INDEPENDENT_AMBULATORY_CARE_PROVIDER_SITE_OTHER)
Admission: EM | Admit: 2018-02-01 | Discharge: 2018-02-01 | Disposition: A | Discharge status: Home / Self Care | Payer: 59 | Source: Home / Self Care

## 2018-02-01 DIAGNOSIS — J069 Acute upper respiratory infection, unspecified: Secondary | ICD-10-CM

## 2018-02-01 DIAGNOSIS — B9789 Other viral agents as the cause of diseases classified elsewhere: Secondary | ICD-10-CM

## 2018-02-01 DIAGNOSIS — J029 Acute pharyngitis, unspecified: Secondary | ICD-10-CM

## 2018-02-01 LAB — POCT RAPID STREP A (OFFICE): Rapid Strep A Screen: NEGATIVE

## 2018-02-01 MED ORDER — HYDROCOD POLST-CPM POLST ER 10-8 MG/5ML PO SUER: 5.0000 mL | Freq: Every evening | ORAL | 0 refills | Status: DC | PRN

## 2018-02-01 MED ORDER — CEFDINIR 300 MG PO CAPS: 600.0000 mg | ORAL_CAPSULE | Freq: Every day | ORAL | 0 refills | Status: DC

## 2018-02-01 MED ORDER — PREDNISONE 20 MG PO TABS: ORAL_TABLET | ORAL | 1 refills | Status: DC

## 2018-02-01 NOTE — ED Triage Notes (Signed)
Pt c/o sore throat since Saturday that has progessively gotten worse. Denies fever. Coughing to the point of vomiting. Using delsym prn.

## 2018-02-01 NOTE — ED Provider Notes (Signed)
[facility]    CSN: [id number] Arrival date & time: 02/01/18  1719     History   Chief Complaint Chief Complaint  Patient presents with  . Sore Throat    HPI Selena RakesLatoya A Meyer is a 33 y.o. female.   This is a 33 year old woman who [facility] with a sore throat.  She was seen here last month for a community-acquired pneumonia.  6 days ago started with cough and congestion.  Developed sore throat three days ago.  Vomited a couple times with the cough.  Patient has a tendency to wheeze with URI's and uses albuterol inhaler.  Works at a day care.     Past Medical History:  Diagnosis Date  . Bronchitis   . Hypertension   . Irregular menstrual cycle   . Irregular periods/menstrual cycles 05/03/2017   Will hold off on Provera or other fertility stuff for now, pt advised pregnancy is not recommended until BP under better control at least, plus other workup pen  . Proteinuria 02/28/2013  . Uncontrolled hypertension 05/10/2017    Patient Active Problem List   Diagnosis Date Noted  . Episodic tension-type headache, not intractable 08/06/2017  . Infertility counseling 08/06/2017  . Systolic murmur 07/16/2017  . Uncontrolled hypertension 05/10/2017  . Irregular periods/menstrual cycles 05/03/2017  . Hypertensive urgency 02/28/2013  . Tobacco abuse 02/28/2013  . Proteinuria 02/28/2013    History reviewed. No pertinent surgical history.  OB History   No obstetric history on file.      Home Medications    Prior to Admission medications   Medication Sig Start Date End Date Taking? Authorizing Provider  acetaminophen (TYLENOL) 500 MG tablet Take 1,000 mg by mouth every 6 (six) hours as needed for mild pain.     [provider]  albuterol (PROVENTIL HFA;VENTOLIN HFA) 108 (90 Base) MCG/ACT inhaler Inhale 2 puffs into the lungs every 6 (six) hours as needed for wheezing or shortness of breath. 12/31/17   Arnaldo Nataliamond, Michael S, MD    amLODipine (NORVASC) 10 MG tablet TAKE 1 TABLET BY MOUTH ONCE DAILY 10/26/17   Monica Bectonhekkekandam, Thomas J, MD  butalbital-acetaminophen-caffeine (FIORICET, ESGIC) [phone] MG tablet Take 1 tablet by mouth every 4 (four) hours as needed for headache. Max 2 pills in 24 hours, max use 5 days per month 05/10/17 05/10/18  Sunnie NielsenAlexander, Natalie, DO  cefdinir (OMNICEF) 300 MG capsule Take 2 capsules (600 mg total) by mouth daily. 02/01/18   Elvina SidleLauenstein, Destiny Trickey, MD  chlorpheniramine-HYDROcodone (TUSSIONEX PENNKINETIC ER) 10-8 MG/5ML SUER Take 5 mLs by mouth at bedtime as needed for cough. 02/01/18   Elvina SidleLauenstein, Mitsugi Schrader, MD  hydrochlorothiazide (HYDRODIURIL) 25 MG tablet Take 1 tablet (25 mg total) by mouth daily. 10/31/17   Sunnie NielsenAlexander, Natalie, DO  ipratropium (ATROVENT) 0.06 % nasal spray Place 2 sprays into both nostrils 4 (four) times daily. 01/03/18   Sunnie NielsenAlexander, Natalie, DO  metoprolol succinate (TOPROL-XL) 200 MG 24 hr tablet Take 1 tablet (200 mg total) by mouth daily. Take with or immediately following a meal. 07/16/17   Monica Bectonhekkekandam, Thomas J, MD  Multiple Vitamins-Minerals (MULTIVITAMIN ADULT) CHEW Chew 2 each by mouth daily.    [provider]  predniSONE (DELTASONE) 20 MG tablet 2 daily with food 02/01/18   Elvina SidleLauenstein, Tran Randle, MD    Family History Family History  Problem Relation Age of Onset  . Diabetes Mother   . High blood pressure Mother   . Renal Disease Mother   . Diabetes Sister   .  Cancer Maternal Grandmother   . Diabetes Maternal Grandmother   . High blood pressure Maternal Grandmother   . Cancer Maternal Aunt        breast  . Other Father     Social History Social History   Tobacco Use  . Smoking status: Former Smoker    Packs/day: 0.25    Years: 5.00    Pack years: 1.25    Types: Cigarettes  . Smokeless tobacco: Never Used  Substance Use Topics  . Alcohol use: Yes    Comment: "occasional use; social drinker"  . Drug use: No     Allergies   Ibuprofen   Review of  Systems Review of Systems   Physical Exam Triage Vital Signs ED Triage Vitals  Enc Vitals Group     BP      Pulse      Resp      Temp      Temp src      SpO2      Weight      Height      Head Circumference      Peak Flow      Pain Score      Pain Loc      Pain Edu?      Excl. in GC?    No data found.  Updated Vital Signs BP (!) 151/95 (BP Location: Right Arm)   Pulse 83   Temp 98.6 F (37 C) (Oral)   Ht 5\' 5"  (1.651 m)   Wt 87.5 kg   SpO2 98%   BMI 32.12 kg/m    Physical Exam Vitals signs and nursing note reviewed.  Constitutional:      Appearance: She is well-developed. She is obese.  HENT:     Head: Normocephalic and atraumatic.     Right Ear: Tympanic membrane normal.     Left Ear: Tympanic membrane normal.     Nose: Congestion present.     Mouth/Throat:     Pharynx: Posterior oropharyngeal erythema present.     Tonsils: Tonsillar exudate present. Swelling: 2+ on the right. 2+ on the left.  Eyes:     Conjunctiva/sclera: Conjunctivae normal.  Neck:     Musculoskeletal: Normal range of motion and neck supple.  Cardiovascular:     Heart sounds: Normal heart sounds.  Pulmonary:     Effort: Pulmonary effort is normal.     Breath sounds: Normal breath sounds.  Skin:    General: Skin is warm and dry.  Neurological:     General: No focal deficit present.     Mental Status: She is alert.  Psychiatric:        Mood and Affect: Mood normal.      UC Treatments / Results  Labs (all labs ordered are listed, but only abnormal results are displayed) Labs Reviewed  STREP A DNA PROBE  POCT RAPID STREP A (OFFICE)    EKG None  Radiology No results found.  Procedures Procedures (including critical care time)  Medications Ordered in UC Medications - No data to display  Initial Impression / Assessment and Plan / UC Course  I have reviewed the triage vital signs and the nursing notes.  Pertinent labs & imaging results that were available during my  care of the patient were reviewed by me and considered in my medical decision making (see chart for details).    Final Clinical Impressions(s) / UC Diagnoses   Final diagnoses:  Acute pharyngitis, unspecified etiology  Viral  URI with cough   Discharge Instructions   None    ED Prescriptions    Medication Sig Dispense Auth. Provider   chlorpheniramine-HYDROcodone (TUSSIONEX PENNKINETIC ER) 10-8 MG/5ML SUER Take 5 mLs by mouth at bedtime as needed for cough. 70 mL Elvina Sidle, MD   cefdinir (OMNICEF) 300 MG capsule Take 2 capsules (600 mg total) by mouth daily. 20 capsule Elvina Sidle, MD   predniSONE (DELTASONE) 20 MG tablet 2 daily with food 10 tablet Elvina Sidle, MD     Controlled Substance Prescriptions [place] Controlled Substance Registry consulted? Yes, I have consulted the [place] Controlled Substances Registry for this patient, and feel the risk/benefit ratio today is favorable for proceeding with this prescription for a controlled substance.   Elvina Sidle, MD 02/01/18 1758

## 2018-02-02 ENCOUNTER — Telehealth: Payer: Self-pay | Admitting: Emergency Medicine

## 2018-02-02 LAB — STREP A DNA PROBE: Group A Strep Probe: NOT DETECTED

## 2018-02-02 NOTE — Telephone Encounter (Signed)
Patient informed of negative throat culture.  Patient states that she feeling a little better.  Will follow up with PCP as needed.

## 2018-02-28 ENCOUNTER — Other Ambulatory Visit: Payer: Self-pay

## 2018-02-28 DIAGNOSIS — I10 Essential (primary) hypertension: Secondary | ICD-10-CM

## 2018-02-28 NOTE — Telephone Encounter (Signed)
Historical provider.  

## 2018-03-01 MED ORDER — AMLODIPINE BESYLATE 10 MG PO TABS: 10.0000 mg | ORAL_TABLET | Freq: Every day | ORAL | 1 refills | Status: DC

## 2018-03-01 MED ORDER — HYDROCHLOROTHIAZIDE 25 MG PO TABS: 25.0000 mg | ORAL_TABLET | Freq: Every day | ORAL | 1 refills | Status: DC

## 2018-04-12 ENCOUNTER — Other Ambulatory Visit: Payer: Self-pay | Admitting: Sports Medicine

## 2018-04-12 ENCOUNTER — Encounter: Payer: Self-pay | Admitting: Podiatry

## 2018-04-12 ENCOUNTER — Other Ambulatory Visit: Payer: Self-pay

## 2018-04-12 ENCOUNTER — Ambulatory Visit (INDEPENDENT_AMBULATORY_CARE_PROVIDER_SITE_OTHER): Payer: 59 | Admitting: Podiatry

## 2018-04-12 VITALS — BP 156/101 | HR 82 | Temp 98.1°F | Resp 16

## 2018-04-12 DIAGNOSIS — M205X9 Other deformities of toe(s) (acquired), unspecified foot: Secondary | ICD-10-CM

## 2018-04-12 DIAGNOSIS — L600 Ingrowing nail: Secondary | ICD-10-CM | POA: Diagnosis not present

## 2018-04-12 DIAGNOSIS — I10 Essential (primary) hypertension: Secondary | ICD-10-CM

## 2018-04-12 MED ORDER — NEOMYCIN-POLYMYXIN-HC 3.5-10000-1 OT SOLN: OTIC | 1 refills | Status: DC

## 2018-04-12 NOTE — Patient Instructions (Addendum)
Soak Instructions    THE DAY AFTER THE PROCEDURE  Place 1/4 cup of epsom salts in a quart of warm tap water.  Submerge your foot or feet with outer bandage intact for the initial soak; this will allow the bandage to become moist and wet for easy lift off.  Once you remove your bandage, continue to soak in the solution for 20 minutes.  This soak should be done twice a day.  Next, remove your foot or feet from solution, blot dry the affected area and cover.  You may use a band aid large enough to cover the area or use gauze and tape.  Apply other medications to the area as directed by the doctor such as polysporin neosporin.  IF YOUR SKIN BECOMES IRRITATED WHILE USING THESE INSTRUCTIONS, IT IS OKAY TO SWITCH TO  WHITE VINEGAR AND WATER. Or you may use antibacterial soap and water to keep the toe clean  Monitor for any signs/symptoms of infection. Call the office immediately if any occur or go directly to the emergency room. Call with any questions/concerns.    Long Term Care Instructions-Post Nail Surgery  You have had your ingrown toenail and root treated with a chemical.  This chemical causes a burn that will drain and ooze like a blister.  This can drain for 6-8 weeks or longer.  It is important to keep this area clean, covered, and follow the soaking instructions dispensed at the time of your surgery.  This area will eventually dry and form a scab.  Once the scab forms you no longer need to soak or apply a dressing.  If at any time you experience an increase in pain, redness, swelling, or drainage, you should contact the office as soon as possible.  Hallux Rigidus  Hallux rigidus is a type of joint pain or joint disease (arthritis) that affects your big toe (hallux). This condition involves the joint that connects the base of your big toe to the main part of your foot (metatarsophalangeal joint). This condition can cause your big toe to become stiff, painful, and difficult to move. Symptoms  may get worse with movement or in cold or damp weather. The condition also gets worse over time. What are the causes? This condition may be caused by having a foot that does not function the way that it should or has an abnormal shape (structural deformity). These foot problems can run in families (be hereditary). This condition can also be caused by:  Injury.  Overuse.  Certain inflammatory diseases, including gout and rheumatoid arthritis. What increases the risk? This condition is more likely to develop in people who:  Have a foot bone (metatarsal) that is longer or higher than normal.  Have a family history of hallux rigidus.  Have previously injured their big toe.  Have feet that do not have a curve (arch) on the inner side of the foot. This may be called flat feet or fallen arches.  Turn their ankles in when they walk (pronation).  Have rheumatoid arthritis or gout.  Have to stoop down often at work. What are the signs or symptoms? Symptoms of this condition include:  Big toe pain.  Stiffness and difficulty moving the big toe.  Swelling of the toe and surrounding area.  Bone spurs. These are bony growths that can form on the joint of the big toe.  A limp. How is this diagnosed? This condition is diagnosed based on a medical history and physical exam. This may include X-rays.  How is this treated? Treatment for this condition includes:  Wearing roomy, comfortable shoes that have a large toe box.  Putting orthotic devices in your shoes.  Pain medicines.  Physical therapy.  Icing the injured area.  Alternate between putting your foot in cold water then warm water. If your condition is severe, treatment may include:  Corticosteroid injections to relieve pain.  Surgery to remove bone spurs, fuse damaged bones together, or replace the entire joint. Follow these instructions at home:  Take over-the-counter and prescription medicines only as told by your  health care provider.  Do not wear high heels or other restrictive footwear. Wear comfortable, supportive shoes that have a large toe box.  Wear orthotics as told by your health care provider, if this applies.  Put your feet in cold water for 30 seconds, then in warm water for 30 seconds. Alternate between the cold and warm water for 5 minutes. Do this several times a day or as told by your health care provider.  If directed, apply ice to the injured area. ? Put ice in a plastic bag. ? Place a towel between your skin and the bag. ? Leave the ice on for 20 minutes, 2-3 times per day.  Do foot exercises as instructed by your health care provider or a physical therapist.  Keep all follow-up visits as told by your health care provider. This is important. Contact a health care provider if:  You notice bone spurs or growths on or around your big toe.  Your pain does not get better or it gets worse.  You have pain while resting.  You have pain in other parts of your body, such as your back, hip, or knee.  You start to limp. This information is not intended to replace advice given to you by your health care provider. Make sure you discuss any questions you have with your health care provider. Document Released: 12/26/2004 Document Revised: 06/03/2015 Document Reviewed: 09/02/2014 Elsevier Interactive Patient Education  2019 [employer].

## 2018-04-12 NOTE — Progress Notes (Signed)
   Subjective:    Patient ID: Selena Meyer, female    DOB: February 11, 1985, 33 y.o.   MRN: [id number]  HPI    Review of Systems  All other systems reviewed and are negative.      Objective:   Physical Exam        Assessment & Plan:

## 2018-04-15 ENCOUNTER — Encounter: Payer: Self-pay | Admitting: Osteopathic Medicine

## 2018-04-15 ENCOUNTER — Other Ambulatory Visit: Payer: Self-pay

## 2018-04-15 DIAGNOSIS — I10 Essential (primary) hypertension: Secondary | ICD-10-CM

## 2018-04-15 DIAGNOSIS — N051 Unspecified nephritic syndrome with focal and segmental glomerular lesions: Secondary | ICD-10-CM | POA: Insufficient documentation

## 2018-04-15 HISTORY — DX: Unspecified nephritic syndrome with focal and segmental glomerular lesions: N05.1

## 2018-04-15 MED ORDER — METOPROLOL SUCCINATE ER 200 MG PO TB24: ORAL_TABLET | ORAL | 3 refills | Status: DC

## 2018-04-15 NOTE — Progress Notes (Signed)
Subjective:   Patient ID: Selena Meyer, female   DOB: 33 y.o.   MRN: [id number]   HPI Patient presents stating she has had a lot of pain in her right big toenail and she feels like her big toe joints do not bend as well as they should especially right over left.  She points to the right big toenail states it is been sore and that there was some drainage a few weeks ago but not draining currently.  Patient does not smoke and likes to be active   Review of Systems  All other systems reviewed and are negative.       Objective:  Physical Exam Vitals signs and nursing note reviewed.  Constitutional:      Appearance: She is well-developed.  Pulmonary:     Effort: Pulmonary effort is normal.  Musculoskeletal: Normal range of motion.  Skin:    General: Skin is warm.  Neurological:     Mental Status: She is alert.     Neurovascular status found to be intact muscle strength is adequate range of motion was found to be within normal limits with patient found to have incurvated lateral border right hallux with no redness or drainage noted with quite a bit of pain with palpation.  Patient is found to have good digital perfusion and is well oriented x3.  Patient does have some enlargement around the first metatarsal bilateral and appears to have a moderate structural with functional hallux limitus deformity with pain around the joint surface noted bilateral     Assessment:  Ingrown toenail deformity right hallux lateral border with pain along with moderate hallux limitus deformity with the beginnings of structural changes occurring     Plan:  H&P education concerning both conditions rendered to patient and today discussed ingrown toenail removal.  Patient wants surgery and I explained procedure and allowed her to read and signed consent form understanding risk.  Today I infiltrated the right hallux 60 mg Xylocaine Marcaine mixture sterile prep applied and using sterile instrumentation I removed  the borders exposed matrix and applied phenol 3 applications 30 seconds followed by alcohol lavage and sterile dressing.  Gave instructions on soaks and instructed on leaving dressing on 24 hours but taking it off earlier if necessary and wrote prescription for drops.  Encouraged her to call with any questions concerns she may have

## 2018-04-15 NOTE — Telephone Encounter (Signed)
Selena Meyer called for a refill on Metoprolol. Never prescribed by Dr Lyn Hollingshead.

## 2018-04-15 NOTE — Telephone Encounter (Signed)
Dr Benjamin Stain prescribed the metoprolol during a visit in July.

## 2018-04-15 NOTE — Telephone Encounter (Signed)
Reviewed nephro notes, confirmed this Rx

## 2018-04-15 NOTE — Telephone Encounter (Signed)
I believe nephology is managing her BP meds, please call pharmacy and as them to forward request to prescriber

## 2018-08-28 ENCOUNTER — Other Ambulatory Visit: Payer: Self-pay | Admitting: Osteopathic Medicine

## 2018-08-28 DIAGNOSIS — I10 Essential (primary) hypertension: Secondary | ICD-10-CM

## 2018-10-30 ENCOUNTER — Telehealth: Payer: Self-pay

## 2018-10-30 NOTE — Telephone Encounter (Signed)
Pt left a vm msg stating that she has been having wheezing and coughing for a few days. This morning pt stated she "blacked out" from a coughing fit. She did not realized that she passed out until she found herself waking up on the floor. Tried contacting pt, no answer. Left a detailed vm msg if symptoms persist or get worse to go to the nearest UC / ER for evaluation. Pt aware to return call back to schedule an appt w/provider regarding her symptoms. Direct call back info provided.

## 2018-10-31 NOTE — Telephone Encounter (Signed)
Discussed with [place] who did speak with the patient directly, though it is not quite clear in the note below.  Agree with nurse recommendations that patient needs appointment/evaluation of some kind.

## 2019-01-20 ENCOUNTER — Other Ambulatory Visit: Payer: Self-pay | Admitting: Osteopathic Medicine

## 2019-01-20 DIAGNOSIS — I10 Essential (primary) hypertension: Secondary | ICD-10-CM

## 2019-09-22 ENCOUNTER — Encounter: Payer: Self-pay | Admitting: Family Medicine

## 2019-09-22 ENCOUNTER — Other Ambulatory Visit: Payer: Self-pay

## 2019-09-22 ENCOUNTER — Ambulatory Visit (INDEPENDENT_AMBULATORY_CARE_PROVIDER_SITE_OTHER): Payer: 59 | Admitting: Family Medicine

## 2019-09-22 DIAGNOSIS — M5416 Radiculopathy, lumbar region: Secondary | ICD-10-CM | POA: Insufficient documentation

## 2019-09-22 HISTORY — DX: Radiculopathy, lumbar region: M54.16

## 2019-09-22 MED ORDER — PREDNISONE 10 MG (21) PO TBPK: ORAL_TABLET | ORAL | 0 refills | Status: DC

## 2019-09-22 MED ORDER — TIZANIDINE HCL 4 MG PO TABS: 4.0000 mg | ORAL_TABLET | Freq: Four times a day (QID) | ORAL | 0 refills | Status: DC | PRN

## 2019-09-22 NOTE — Assessment & Plan Note (Signed)
She has allergy to ibuprofen.  Unsure if she has taken other NSAIDS safely.  Will start prednisone taper with tizanidine as needed.  Start gentle stretches as pain improves She may also try heat to low back as needed.  Instructed to call for follow up if not improving or if symptoms continue to worsen.

## 2019-09-22 NOTE — Patient Instructions (Signed)
Back Injury Prevention Back injuries can be very painful. They can also be difficult to heal. After having one back injury, you are more likely to have another one again. It is important to learn how to avoid injuring or re-injuring your back. The following tips can help you to prevent a back injury. What actions can I take to prevent back injuries? Nutrition changes Talk with your health care provider about your overall diet, and especially about foods that strengthen your bones.  Ask your health care provider how much calcium and vitamin D you need each day. These nutrients help to prevent weakening of the bones (osteoporosis). Osteoporosis can cause broken (fractured) bones, which lead to back pain.  Eat foods that are good sources of calcium. These include dairy products, green leafy vegetables, and products that have had calcium added to them (fortified).  Eat foods that are good sources of vitamin D. These include milk and foods that are fortified with vitamin D.  If needed, take supplements and vitamins as directed by your health care provider. Physical fitness Physical fitness strengthens your bones and your muscles. It also increases your balance and strength.  Exercise for 30 minutes per day on most days of the week, or as directed by your health care provider. Make sure to: ? Do aerobic exercises, such as walking, jogging, biking, or swimming. ? Do exercises that increase balance and strength, such as tai chi and yoga. These can decrease your risk of falling and injuring your back. ? Do stretching exercises to help with flexibility. ? Develop strong abdominal muscles. Your abdominal muscles provide a lot of the support that your back needs.  Maintain a healthy weight. This helps to decrease your risk of a back injury. Good posture        Prevent back injuries by developing and maintaining a good posture. To do this successfully:  Sit up and stand up straight. Avoid leaning  forward when you sit or hunching over when you stand.  Choose chairs that have good low-back (lumbar) support.  If you work at a desk, sit close to it so you do not need to lean over. Keep your chin tucked in. Keep your neck drawn back, and keep your elbows bent at a right angle.  Sit high and close to the steering wheel when you drive. Add a lumbar support to your car seat, if needed.  Avoid sitting or standing in one position for very long. Take breaks to get up, stretch, and walk around at least one time every hour. Take breaks every hour if you are driving for long periods of time.  Sleep on your side with your knees slightly bent, or sleep on your back with a pillow under your knees.  Lifting, twisting, and reaching Back injuries are more likely to occur when carrying loads and twisting at the same time. When you bend and lift, or reach for items that are high up in shelves, use positions that put less stress on your back.  Heavy lifting ? Avoid heavy lifting, especially the kind of heavy lifting that is repetitive. If you must do heavy lifting:  Stretch before lifting.  Work slowly.  Rest between lifts.  Use a tool such as a cart or a dolly to move objects.  Make several small trips instead of carrying one heavy load.  Ask for help when you need it, especially when moving big or heavy objects. ? Follow these steps when lifting:  Stand with your feet   shoulder-width apart.  Get as close to the object as you can. Do not try to pick up a heavy object that is far from your body.  Use handles or lifting straps if they are available.  Bend at your knees. Squat down, but keep your heels off the floor.  Keep your shoulders pulled back, your chin tucked in, and your back straight.  Lift the object slowly while you tighten the muscles in your legs, abdomen, and buttocks. Keep the object as close to the center of your body as possible. ? Follow these steps when putting down a  heavy load:  Stand with your feet shoulder-width apart.  Lower the object slowly while you tighten the muscles in your legs, abdomen, and buttocks. Keep the object as close to the center of your body as possible.  Keep your shoulders pulled back, your chin tucked in, and your back straight.  Bend at your knees. Squat down, but keep your heels off the floor.  Use handles or lifting straps if they are available.  Twisting and reaching ? Avoid lifting heavy objects above your waist. ? Do not twist at your waist while you are lifting or carrying a load. If you need to turn, move your feet. ? Do not bend over without bending at your knees. ? Avoid reaching over your head, across a table, or for an object on a high surface.  Other changes   Avoid wet floors and icy ground. Keep sidewalks clear of ice to prevent falls.  Do not sleep on a mattress that is too soft or too hard.  Put heavier objects on shelves at waist level, and put lighter objects on lower or higher shelves.  Find ways to decrease your stress, such as by exercising, getting a massage, or practicing relaxation techniques. Stress can build up in your muscles. Tense muscles are more vulnerable to injury.  Talk with your health care provider if you feel anxious or depressed. These conditions can make back pain worse.  Wear flat heel shoes with cushioned soles.  Use both shoulder straps when carrying a backpack.  Do not use any products that contain nicotine or tobacco, such as cigarettes and e-cigarettes. If you need help quitting, ask your health care provider. Summary  Back injuries can be very painful and difficult to heal.  You can prevent injuring or re-injuring your back by making nutrition changes, working on being physically fit, developing a good posture, and lifting heavy objects in a safe way.  Making other changes can also help to prevent back injuries. These include eating a healthy diet, exercising  regularly and maintaining a healthy weight. This information is not intended to replace advice given to you by your health care provider. Make sure you discuss any questions you have with your health care provider. Document Revised: 09/18/2018 Document Reviewed: 02/10/2017 Elsevier Patient Education  2020 [employer].

## 2019-09-22 NOTE — Progress Notes (Signed)
Selena Meyer - 34 y.o. female MRN [id number]  Date of birth: 11-19-1985  Subjective Chief Complaint  Patient presents with  . Back Pain    HPI Selena Meyer is a 34 y.o. female here today with complaint of low back pain.  He reports that pain started about 1 week ago.  She does not recall any particular injury or overuse.  She has had similar episodes previously.  Movement makes symptoms worse. It is difficult for her to stand up because her back "catches".   She does have some radiation into her thighs.  She denies weakness, numbness or tingling.    ROS:  A comprehensive ROS was completed and negative except as noted per HPI  Allergies  Allergen Reactions  . Ibuprofen Hives    Past Medical History:  Diagnosis Date  . Bronchitis   . 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   [name] Hypertension   . Irregular menstrual cycle   . Irregular periods/menstrual cycles 05/03/2017   Will hold off on Provera or other fertility stuff for now, pt advised pregnancy is not recommended until BP under better control at least, plus other workup pen  . Proteinuria 02/28/2013  . Uncontrolled hypertension 05/10/2017    History reviewed. No pertinent surgical history.  Social History   Socioeconomic History  . Marital status: Married    Spouse name: Ivar Drape  . Number of children: 0  . Years of education: Not on file  . Highest education level: Not on file  Occupational History    Employer: UNEMPLOYED  Tobacco Use  . Smoking status: Former Smoker    Packs/day: 0.25    Years: 5.00    Pack years: 1.25    Types: Cigarettes  . Smokeless tobacco: Never Used  Vaping Use  . Vaping Use: Never used  Substance and Sexual Activity  . Alcohol use: Yes    Comment: "occasional use; social drinker"  . Drug use: No  . Sexual activity: Yes    Partners: Male    Birth  control/protection: None  Other Topics Concern  . Not on file  Social History Narrative  . Not on file   Social Determinants of Health   Financial Resource Strain:   . Difficulty of Paying Living Expenses: Not on file  Food Insecurity:   . Worried About Programme researcher, broadcasting/film/video in the Last Year: Not on file  . Ran Out of Food in the Last Year: Not on file  Transportation Needs:   . Lack of Transportation (Medical): Not on file  . Lack of Transportation (Non-Medical): Not on file  Physical Activity:   . Days of Exercise per Week: Not on file  . Minutes of Exercise per Session: Not on file  Stress:   . Feeling of Stress : Not on file  Social Connections:   . Frequency of Communication with Friends and Family: Not on file  . Frequency of Social Gatherings with Friends and Family: Not on file  . Attends Religious Services: Not on file  . Active Member of Clubs or Organizations: Not on file  . Attends Banker Meetings: Not on file  . Marital Status: Not on file    Family History  Problem Relation Age of Onset  . Diabetes Mother   . High blood pressure Mother   . Renal Disease Mother   . Diabetes Sister   .  Cancer Maternal Grandmother   . Diabetes Maternal Grandmother   . High blood pressure Maternal Grandmother   . Cancer Maternal Aunt        breast  . Other Father     Health Maintenance  Topic Date Due  . Hepatitis C Screening  Never done  . COVID-19 Vaccine (1) Never done  . HIV Screening  Never done  . INFLUENZA VACCINE  Never done  . PAP SMEAR-Modifier  09/04/2020  . TETANUS/TDAP  01/09/2025     ----------------------------------------------------------------------------------------------------------------------------------------------------------------------------------------------------------------- Physical Exam BP (!) 172/113 (BP Location: Right Arm, Patient Position: Sitting, Cuff Size: Large)   Pulse (!) 106   Wt 191 lb (86.6 kg)   SpO2 100%    BMI 31.78 kg/m   Physical Exam Constitutional:      Appearance: Normal appearance.  HENT:     Head: Normocephalic and atraumatic.  Musculoskeletal:     Comments: ROM limited in all planes due to pain.  She has ttp and spasm across lower lumbar paraspinals.  No significant weakness or sensation deficits.    Neurological:     General: No focal deficit present.     Mental Status: She is alert.  Psychiatric:        Mood and Affect: Mood normal.        Behavior: Behavior normal.     ------------------------------------------------------------------------------------------------------------------------------------------------------------------------------------------------------------------- Assessment and Plan  Lumbar radiculopathy She has allergy to ibuprofen.  Unsure if she has taken other NSAIDS safely.  Will start prednisone taper with tizanidine as needed.  Start gentle stretches as pain improves She may also try heat to low back as needed.  Instructed to call for follow up if not improving or if symptoms continue to worsen.    Meds ordered this encounter  Medications  . tiZANidine (ZANAFLEX) 4 MG tablet    Sig: Take 1 tablet (4 mg total) by mouth every 6 (six) hours as needed for muscle spasms.    Dispense:  30 tablet    Refill:  0  . predniSONE (STERAPRED UNI-PAK 21 TAB) 10 MG (21) TBPK tablet    Sig: Taper as directed on packaging.    Dispense:  21 tablet    Refill:  0    No follow-ups on file.    This visit occurred during the SARS-CoV-2 public health emergency.  Safety protocols were in place, including screening questions prior to the visit, additional usage of staff PPE, and extensive cleaning of exam room while observing appropriate contact time as indicated for disinfecting solutions.

## 2020-03-30 ENCOUNTER — Ambulatory Visit (INDEPENDENT_AMBULATORY_CARE_PROVIDER_SITE_OTHER): Payer: 59 | Admitting: Osteopathic Medicine

## 2020-03-30 ENCOUNTER — Encounter: Payer: Self-pay | Admitting: Osteopathic Medicine

## 2020-03-30 ENCOUNTER — Other Ambulatory Visit: Payer: Self-pay

## 2020-03-30 VITALS — BP 166/120 | HR 109 | Resp 20 | Ht 65.0 in | Wt 182.0 lb

## 2020-03-30 DIAGNOSIS — Z0000 Encounter for general adult medical examination without abnormal findings: Secondary | ICD-10-CM | POA: Diagnosis not present

## 2020-03-30 DIAGNOSIS — I10 Essential (primary) hypertension: Secondary | ICD-10-CM | POA: Diagnosis not present

## 2020-03-30 DIAGNOSIS — N6315 Unspecified lump in the right breast, overlapping quadrants: Secondary | ICD-10-CM | POA: Diagnosis not present

## 2020-03-30 MED ORDER — ALBUTEROL SULFATE HFA 108 (90 BASE) MCG/ACT IN AERS: 2.0000 | INHALATION_SPRAY | Freq: Four times a day (QID) | RESPIRATORY_TRACT | 1 refills | Status: DC | PRN

## 2020-03-30 MED ORDER — METOPROLOL SUCCINATE ER 200 MG PO TB24: ORAL_TABLET | ORAL | 3 refills | Status: DC

## 2020-03-30 MED ORDER — AMLODIPINE BESYLATE 10 MG PO TABS: 10.0000 mg | ORAL_TABLET | Freq: Every day | ORAL | 0 refills | Status: DC

## 2020-03-30 MED ORDER — HYDROCHLOROTHIAZIDE 25 MG PO TABS: 25.0000 mg | ORAL_TABLET | Freq: Every day | ORAL | 0 refills | Status: DC

## 2020-03-30 NOTE — Progress Notes (Signed)
Selena Meyer is a 35 y.o. female who presents to  [facility] at [facility]  today, 03/30/20, seeking care for the following:  . Concern for fairly large breast lump on R, noticed this last week. Tender. No skin changes. Lump present on exam feels relatively firm but not hard, round, mild tenderness, no overlying rash, no nipple discharge, no axillary lymphadenopathy. L breast normal  . BP above goal - following w/ nephrology      ASSESSMENT & PLAN with other pertinent findings:  The primary encounter diagnosis was Breast lump on right side at 9 o'clock position. Diagnoses of Annual physical exam and Uncontrolled hypertension were also pertinent to this visit.    Patient Instructions   Orders are in for breast imaging to evaluate lump - you should get a call  Orders are in for labs for next visit / annual!   Please schedule something for annual check-up in next month or two. I've refilled medications, please restart these! (start BP pills at half tablets for first week then increase to full dose on list.    Orders Placed This Encounter  Procedures  . MM Digital Diagnostic Bilat  . US BREAST COMPLETE UNI LEFT INC AXILLA  . US BREAST COMPLETE UNI RIGHT INC AXILLA  . CBC  . COMPLETE METABOLIC PANEL WITH GFR  . Lipid panel    Meds ordered this encounter  Medications  . metoprolol (TOPROL-XL) 200 MG 24 hr tablet    Sig: TAKE 1 TABLET BY MOUTH ONCE DAILY. TAKE WITH OR IMMEDIATELY FOLLOWING A MEAL.    Dispense:  90 tablet    Refill:  3  . hydrochlorothiazide (HYDRODIURIL) 25 MG tablet    Sig: Take 1 tablet (25 mg total) by mouth daily.    Dispense:  90 tablet    Refill:  0  . amLODipine (NORVASC) 10 MG tablet    Sig: Take 1 tablet (10 mg total) by mouth daily.    Dispense:  90 tablet    Refill:  0  . albuterol (VENTOLIN HFA) 108 (90 Base) MCG/ACT inhaler    Sig: Inhale 2 puffs into the lungs every 6 (six) hours as needed for  wheezing or shortness of breath.    Dispense:  1 each    Refill:  1     See below for relevant physical exam findings  See below for recent lab and imaging results reviewed  Medications, allergies, PMH, PSH, SocH, FamH reviewed below    Follow-up instructions: Return in about 4 weeks (around 04/27/2020) for ANNUAL (labs 2+ days prior to appt, orders are in).                                        Exam:  BP (!) 166/120 (BP Location: Right Arm, Patient Position: Sitting, Cuff Size: Large)   Pulse (!) 109   Resp 20   Ht 5\' 5"  (1.651 m)   Wt 182 lb (82.6 kg)   SpO2 100%   BMI 30.29 kg/m   Constitutional: VS see above. General Appearance: alert, well-developed, well-nourished, NAD  Neck: No masses, trachea midline.   Respiratory: Normal respiratory effort. no wheeze, no rhonchi, no rales  Cardiovascular: S1/S2 normal, no murmur, no rub/gallop auscultated. RRR.   Musculoskeletal: Gait normal. Symmetric and independent movement of all extremities  Neurological: Normal balance/coordination. No tremor.  Skin: warm, dry,  intact.  Psychiatric: Normal judgment/insight. Normal mood and affect. Oriented x3.   BREAST: No rashes/skin changes, normal fibrous breast tissue, (+)R mass w/ tenderness as above, otherwise L side normal, normal nipple without discharge, normal axilla    Current Meds  Medication Sig  . acetaminophen (TYLENOL) 500 MG tablet Take 1,000 mg by mouth every 6 (six) hours as needed for mild pain.   [name] ipratropium (ATROVENT) 0.06 % nasal spray Place 2 sprays into both nostrils 4 (four) times daily.  . Multiple Vitamins-Minerals (MULTIVITAMIN ADULT) CHEW Chew 2 each by mouth as needed.   . neomycin-polymyxin-hydrocortisone (CORTISPORIN) OTIC solution Apply 1-2 drops to toe after soaking BID  . predniSONE (STERAPRED UNI-PAK 21 TAB) 10 MG (21) TBPK tablet Taper as directed on packaging.  [name] tiZANidine (ZANAFLEX) 4 MG tablet Take 1  tablet (4 mg total) by mouth every 6 (six) hours as needed for muscle spasms.  . [DISCONTINUED] albuterol (PROVENTIL HFA;VENTOLIN HFA) 108 (90 Base) MCG/ACT inhaler Inhale 2 puffs into the lungs every 6 (six) hours as needed for wheezing or shortness of breath.  . [DISCONTINUED] amLODipine (NORVASC) 10 MG tablet Take 1 tablet by mouth once daily  . [DISCONTINUED] hydrochlorothiazide (HYDRODIURIL) 25 MG tablet Take 1 tablet by mouth once daily  . [DISCONTINUED] metoprolol (TOPROL-XL) 200 MG 24 hr tablet TAKE 1 TABLET BY MOUTH ONCE DAILY. TAKE WITH OR IMMEDIATELY FOLLOWING A MEAL.    Allergies  Allergen Reactions  . Ibuprofen Hives    Patient Active Problem List   Diagnosis Date Noted  . Lumbar radiculopathy 09/22/2019  . 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 Episodic tension-type headache, not intractable 08/06/2017  . Infertility counseling 08/06/2017  . Systolic murmur 07/16/2017  . Uncontrolled hypertension 05/10/2017  . Irregular periods/menstrual cycles 05/03/2017  . Polycystic ovaries 11/09/2015  . At risk for infertility 03/30/2015  . Amenorrhea 03/30/2015  . Hypertensive urgency 02/28/2013  . Tobacco abuse 02/28/2013  . Proteinuria 02/28/2013    Family History  Problem Relation Age of Onset  . Diabetes Mother   . High blood pressure Mother   . Renal Disease Mother   . Diabetes Sister   . Cancer Maternal Grandmother   . Diabetes Maternal Grandmother   . High blood pressure Maternal Grandmother   . Cancer Maternal Aunt        breast  . Other Father     Social History   Tobacco Use  Smoking Status Former Smoker  . Packs/day: 0.25  . Years: 5.00  . Pack years: 1.25  . Types: Cigarettes  Smokeless Tobacco Never Used    No past surgical history on file.  Immunization History  Administered Date(s) Administered  . PPD Test 07/16/2017  . Tdap 01/10/2015    No results found for this or any previous visit (from the past 2160 hour(s)).  No  results found.     All questions at time of visit were answered - patient instructed to contact office with any additional concerns or updates. ER/RTC precautions were reviewed with the patient as applicable.   Please note: manual typing as well as voice recognition software may have been used to produce this document - typos may escape review. Please contact Dr. Lyn Hollingshead for any needed clarifications.

## 2020-03-30 NOTE — Patient Instructions (Signed)
   Orders are in for breast imaging to evaluate lump - you should get a call  Orders are in for labs for next visit / annual!   Please schedule something for annual check-up in next month or two. I've refilled medications, please restart these! (start BP pills at half tablets for first week then increase to full dose on list.

## 2020-04-26 LAB — CBC
HCT: 31 % — ABNORMAL LOW (ref 35.0–45.0)
Hemoglobin: 8.8 g/dL — ABNORMAL LOW (ref 11.7–15.5)
MCH: 20.2 pg — ABNORMAL LOW (ref 27.0–33.0)
MCHC: 28.4 g/dL — ABNORMAL LOW (ref 32.0–36.0)
MCV: 71.3 fL — ABNORMAL LOW (ref 80.0–100.0)
MPV: 9 fL (ref 7.5–12.5)
Platelets: 466 10*3/uL — ABNORMAL HIGH (ref 140–400)
RBC: 4.35 10*6/uL (ref 3.80–5.10)
RDW: 16.1 % — ABNORMAL HIGH (ref 11.0–15.0)
WBC: 11.6 10*3/uL — ABNORMAL HIGH (ref 3.8–10.8)

## 2020-04-27 ENCOUNTER — Other Ambulatory Visit: Payer: Self-pay

## 2020-04-27 ENCOUNTER — Ambulatory Visit (INDEPENDENT_AMBULATORY_CARE_PROVIDER_SITE_OTHER): Payer: 59 | Admitting: Osteopathic Medicine

## 2020-04-27 ENCOUNTER — Encounter: Payer: Self-pay | Admitting: Osteopathic Medicine

## 2020-04-27 VITALS — BP 141/77 | HR 73 | Temp 98.1°F | Wt 182.1 lb

## 2020-04-27 DIAGNOSIS — R7309 Other abnormal glucose: Secondary | ICD-10-CM | POA: Diagnosis not present

## 2020-04-27 DIAGNOSIS — E782 Mixed hyperlipidemia: Secondary | ICD-10-CM

## 2020-04-27 DIAGNOSIS — Z0000 Encounter for general adult medical examination without abnormal findings: Secondary | ICD-10-CM | POA: Diagnosis not present

## 2020-04-27 DIAGNOSIS — N2889 Other specified disorders of kidney and ureter: Secondary | ICD-10-CM

## 2020-04-27 DIAGNOSIS — N182 Chronic kidney disease, stage 2 (mild): Secondary | ICD-10-CM

## 2020-04-27 DIAGNOSIS — D509 Iron deficiency anemia, unspecified: Secondary | ICD-10-CM | POA: Insufficient documentation

## 2020-04-27 DIAGNOSIS — I151 Hypertension secondary to other renal disorders: Secondary | ICD-10-CM | POA: Diagnosis not present

## 2020-04-27 DIAGNOSIS — D649 Anemia, unspecified: Secondary | ICD-10-CM

## 2020-04-27 HISTORY — DX: Chronic kidney disease, stage 2 (mild): N18.2

## 2020-04-27 HISTORY — DX: Mixed hyperlipidemia: E78.2

## 2020-04-27 MED ORDER — HYDROCHLOROTHIAZIDE 25 MG PO TABS: 25.0000 mg | ORAL_TABLET | Freq: Every day | ORAL | 3 refills | Status: DC

## 2020-04-27 MED ORDER — METOPROLOL SUCCINATE ER 200 MG PO TB24: ORAL_TABLET | ORAL | 3 refills | Status: DC

## 2020-04-27 MED ORDER — AMLODIPINE BESYLATE 10 MG PO TABS: 10.0000 mg | ORAL_TABLET | Freq: Every day | ORAL | 3 refills | Status: DC

## 2020-04-27 NOTE — Patient Instructions (Addendum)
General Preventive Care  Most recent routine screening labs: see attached.   Blood pressure goal 130/80 or less.   Tobacco: don't!  Alcohol: responsible moderation is ok for most adults - if you have concerns about your alcohol intake, please talk to me!   Exercise: as tolerated to reduce risk of cardiovascular disease and diabetes. Strength training will also prevent osteoporosis.   Mental health: if need for mental health care (medicines, counseling, other), or concerns about moods, please let me know!   Sexual / Reproductive health: if need for STD testing, or if concerns with libido/pain problems, please let me know! If you need to discuss family planning, please let me know!   Advanced Directive: Living Will and/or Healthcare Power of Attorney recommended for all adults, regardless of age or health.  Vaccines  Flu vaccine: for almost everyone, every fall/winter.   Shingles vaccine: after age 17.   Pneumonia vaccines: after age 29  Tetanus booster: every 10 years / 3rd trimester of pregnancy  HPV vaccine: Gardasil up to age 39   COVID vaccine: STRONGLY RECOMMENDED  Cancer screenings   Colon cancer screening: for everyone age 62-75.   Breast cancer screening: pending imaging   Cervical cancer screening: Pap due 08/2022  Lung cancer screening: age 59 depending on smoking history  Infection screenings  . HIV: recommended screening at least once age 52-65 . Gonorrhea/Chlamydia, other STI: screening as needed . Hepatitis C: recommended once for everyone age 28-75 . TB: certain at-risk populations, or depending on work requirements and/or travel history Other . Bone Density Test: recommended age 65

## 2020-04-27 NOTE — Progress Notes (Signed)
Selena Meyer is a 35 y.o. female who presents to  [facility] at [facility]  today, 04/27/20, seeking care for the following:  . Annual physical . Review labs, see results below     ASSESSMENT & PLAN with other pertinent findings:  The primary encounter diagnosis was Annual physical exam. Diagnoses of Hypertension secondary to other renal disorders, Anemia, unspecified type, Elevated hemoglobin A1c measurement, Elevated cholesterol with high triglycerides, and Stage 2 chronic kidney disease were also pertinent to this visit.   1. Annual physical exam See below for review of preventive care, pretty well caught up  2. Hypertension secondary to other renal disorders Blood pressure a bit above goal, but definitely better than it was when we started.  Patient is following with nephrology  3. Anemia, unspecified type Significantly decreased hemoglobin levels, given low MCV concern for iron deficiency anemia, anemia of chronic disease.  Add on labs ordered today  4. Elevated hemoglobin A1c measurement Not likely very accurate in the setting of significant anemia, will follow  5. Elevated cholesterol with high triglycerides Discussed lifestyle modifications, patient states overall she is eating fairly healthy, not able to get much exercise due to fatigue which is likely secondary to anemia.  Would like to work on diet/exercise as able, I encouraged OTC omega-3 versus increase healthy fatty fish intake  6. Stage 2 chronic kidney disease Creatinine stable, following with nephrology   Patient Instructions  General Preventive Care  Most recent routine screening labs: see attached.   Blood pressure goal 130/80 or less.   Tobacco: don't!  Alcohol: responsible moderation is ok for most adults - if you have concerns about your alcohol intake, please talk to me!   Exercise: as tolerated to reduce risk of cardiovascular disease and diabetes.  Strength training will also prevent osteoporosis.   Mental health: if need for mental health care (medicines, counseling, other), or concerns about moods, please let me know!   Sexual / Reproductive health: if need for STD testing, or if concerns with libido/pain problems, please let me know! If you need to discuss family planning, please let me know!   Advanced Directive: Living Will and/or Healthcare Power of Attorney recommended for all adults, regardless of age or health.  Vaccines  Flu vaccine: for almost everyone, every fall/winter.   Shingles vaccine: after age 101.   Pneumonia vaccines: after age 33  Tetanus booster: every 10 years / 3rd trimester of pregnancy  HPV vaccine: Gardasil up to age 19   COVID vaccine: STRONGLY RECOMMENDED  Cancer screenings   Colon cancer screening: for everyone age 31-75.   Breast cancer screening: pending imaging   Cervical cancer screening: Pap due 08/2022  Lung cancer screening: age 57 depending on smoking history  Infection screenings  . HIV: recommended screening at least once age 14-65 . Gonorrhea/Chlamydia, other STI: screening as needed . Hepatitis C: recommended once for everyone age 97-75 . TB: certain at-risk populations, or depending on work requirements and/or travel history Other . Bone Density Test: recommended age 59   Orders Placed This Encounter  Procedures  . CBC with Differential/Platelet  . Fe+TIBC+Fer  . Pathologist smear review  . Reticulocytes  . Erythropoietin    Meds ordered this encounter  Medications  . amLODipine (NORVASC) 10 MG tablet    Sig: Take 1 tablet (10 mg total) by mouth daily.    Dispense:  90 tablet    Refill:  3  . metoprolol (TOPROL-XL) 200  MG 24 hr tablet    Sig: TAKE 1 TABLET BY MOUTH ONCE DAILY. TAKE WITH OR IMMEDIATELY FOLLOWING A MEAL.    Dispense:  90 tablet    Refill:  3  . hydrochlorothiazide (HYDRODIURIL) 25 MG tablet    Sig: Take 1 tablet (25 mg total) by mouth daily.     Dispense:  90 tablet    Refill:  3     See below for relevant physical exam findings  See below for recent lab and imaging results reviewed  Medications, allergies, PMH, PSH, SocH, FamH reviewed below    Follow-up instructions: Return for RECHECK PENDING RESULTS - ANEMIA. FOLLOW UP CHOLESTEROL AND SUGARS IN 3 MOS .                                        Exam:  BP (!) 141/77 (BP Location: Left Arm, Patient Position: Sitting, Cuff Size: Large)   Pulse 73   Temp 98.1 F (36.7 C) (Oral)   Wt 182 lb 1.9 oz (82.6 kg)   BMI 30.31 kg/m   Constitutional: VS see above. General Appearance: alert, well-developed, well-nourished, NAD  Neck: No masses, trachea midline.   Respiratory: Normal respiratory effort. no wheeze, no rhonchi, no rales  Cardiovascular: S1/S2 normal, no murmur, no rub/gallop auscultated. RRR.   Musculoskeletal: Gait normal. Symmetric and independent movement of all extremities  Abdominal: non-tender, non-distended, no appreciable organomegaly, neg Murphy's, BS WNLx4  Neurological: Normal balance/coordination. No tremor.  Skin: warm, dry, intact.   Psychiatric: Normal judgment/insight. Normal mood and affect. Oriented x3.   Current Meds  Medication Sig  . acetaminophen (TYLENOL) 500 MG tablet Take 1,000 mg by mouth every 6 (six) hours as needed for mild pain.   [name]. albuterol (VENTOLIN HFA) 108 (90 Base) MCG/ACT inhaler Inhale 2 puffs into the lungs every 6 (six) hours as needed for wheezing or shortness of breath.  . Multiple Vitamins-Minerals (MULTIVITAMIN ADULT) CHEW Chew 2 each by mouth as needed.   . [DISCONTINUED] amLODipine (NORVASC) 10 MG tablet Take 1 tablet (10 mg total) by mouth daily.  . [DISCONTINUED] hydrochlorothiazide (HYDRODIURIL) 25 MG tablet Take 1 tablet (25 mg total) by mouth daily.  . [DISCONTINUED] ipratropium (ATROVENT) 0.06 % nasal spray Place 2 sprays into both nostrils 4 (four) times daily.  .  [DISCONTINUED] metoprolol (TOPROL-XL) 200 MG 24 hr tablet TAKE 1 TABLET BY MOUTH ONCE DAILY. TAKE WITH OR IMMEDIATELY FOLLOWING A MEAL.  . [DISCONTINUED] neomycin-polymyxin-hydrocortisone (CORTISPORIN) OTIC solution Apply 1-2 drops to toe after soaking BID  . [DISCONTINUED] predniSONE (STERAPRED UNI-PAK 21 TAB) 10 MG (21) TBPK tablet Taper as directed on packaging.  . [DISCONTINUED] tiZANidine (ZANAFLEX) 4 MG tablet Take 1 tablet (4 mg total) by mouth every 6 (six) hours as needed for muscle spasms.    Allergies  Allergen Reactions  . Ibuprofen Hives    Patient Active Problem List   Diagnosis Date Noted  . Stage 2 chronic kidney disease 04/27/2020  . Elevated cholesterol with high triglycerides 04/27/2020  . Elevated hemoglobin A1c measurement 04/27/2020  . Anemia 04/27/2020  . Hypertension secondary to other renal disorders 04/27/2020  . Lumbar radiculopathy 09/22/2019  . 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 Episodic tension-type headache, not intractable 08/06/2017  . Infertility counseling 08/06/2017  . Systolic murmur 07/16/2017  . Uncontrolled hypertension 05/10/2017  . Irregular periods/menstrual cycles 05/03/2017  . Polycystic ovaries 11/09/2015  .  At risk for infertility 03/30/2015  . Amenorrhea 03/30/2015  . Hypertensive urgency 02/28/2013  . Tobacco abuse 02/28/2013  . Proteinuria 02/28/2013    Family History  Problem Relation Age of Onset  . Diabetes Mother   . High blood pressure Mother   . Renal Disease Mother   . Diabetes Sister   . Cancer Maternal Grandmother   . Diabetes Maternal Grandmother   . High blood pressure Maternal Grandmother   . Cancer Maternal Aunt        breast  . Other Father     Social History   Tobacco Use  Smoking Status Former Smoker  . Packs/day: 0.25  . Years: 5.00  . Pack years: 1.25  . Types: Cigarettes  Smokeless Tobacco Never Used    No past surgical history on file.  Immunization History   Administered Date(s) Administered  . PPD Test 07/16/2017  . Tdap 01/10/2015    Recent Results (from the past 2160 hour(s))  CBC     Status: Abnormal   Collection Time: 04/26/20  8:02 AM  Result Value Ref Range   WBC 11.6 (H) 3.8 - 10.8 Thousand/uL   RBC 4.35 3.80 - 5.10 Million/uL   Hemoglobin 8.8 (L) 11.7 - 15.5 g/dL   HCT 33.2 (L) 95.1 - 88.4 %   MCV 71.3 (L) 80.0 - 100.0 fL   MCH 20.2 (L) 27.0 - 33.0 pg   MCHC 28.4 (L) 32.0 - 36.0 g/dL   RDW 16.6 (H) 06.3 - 01.6 %   Platelets 466 (H) 140 - 400 Thousand/uL   MPV 9.0 7.5 - 12.5 fL  COMPLETE METABOLIC PANEL WITH GFR     Status: Abnormal   Collection Time: 04/26/20  8:02 AM  Result Value Ref Range   Glucose, Bld 131 (H) 65 - 99 mg/dL    Comment: .            Fasting reference interval . For someone without known diabetes, a glucose value >125 mg/dL indicates that they may have diabetes and this should be confirmed with a follow-up test. .    BUN 14 7 - 25 mg/dL   Creat 0.10 (H) 9.32 - 1.10 mg/dL   GFR, Est Non African American 56 (L) > OR = 60 mL/min/1.46m2   GFR, Est African American 65 > OR = 60 mL/min/1.10m2   BUN/Creatinine Ratio 11 6 - 22 (calc)   Sodium 138 135 - 146 mmol/L   Potassium 4.2 3.5 - 5.3 mmol/L   Chloride 103 98 - 110 mmol/L   CO2 26 20 - 32 mmol/L   Calcium 9.1 8.6 - 10.2 mg/dL   Total Protein 7.0 6.1 - 8.1 g/dL   Albumin 3.9 3.6 - 5.1 g/dL   Globulin 3.1 1.9 - 3.7 g/dL (calc)   AG Ratio 1.3 1.0 - 2.5 (calc)   Total Bilirubin 0.2 0.2 - 1.2 mg/dL   Alkaline phosphatase (APISO) 101 31 - 125 U/L   AST 10 10 - 30 U/L   ALT 8 6 - 29 U/L  Lipid panel     Status: Abnormal   Collection Time: 04/26/20  8:02 AM  Result Value Ref Range   Cholesterol 195 <200 mg/dL   HDL 36 (L) > OR = 50 mg/dL   Triglycerides 355 (H) <150 mg/dL    Comment: . If a non-fasting specimen was collected, consider repeat triglyceride testing on a fasting specimen if clinically indicated.  Perry Mount et al. J. of Clin.  Lipidol. 2015;9:129-169. [name]  LDL Cholesterol (Calc)  mg/dL (calc)    Comment: . LDL cholesterol not calculated. Triglyceride levels greater than 400 mg/dL invalidate calculated LDL results. . Reference range: <100 . Desirable range <100 mg/dL for primary prevention;   <70 mg/dL for patients with CHD or diabetic patients  with > or = 2 CHD risk factors. [name] LDL-C is now calculated using the Martin-Hopkins  calculation, which is a validated novel method providing  better accuracy than the Friedewald equation in the  estimation of LDL-C.  Horald Pollen et al. Lenox Ahr. 4656;812(75): 2061-2068  (http://education.[employer].com/faq/FAQ164)    Total CHOL/HDL Ratio 5.4 (H) <5.0 (calc)   Non-HDL Cholesterol (Calc) 159 (H) <130 mg/dL (calc)    Comment: For patients with diabetes plus 1 major ASCVD risk  factor, treating to a non-HDL-C goal of <100 mg/dL  (LDL-C of <17 mg/dL) is considered a therapeutic  option.     No results found.     All questions at time of visit were answered - patient instructed to contact office with any additional concerns or updates. ER/RTC precautions were reviewed with the patient as applicable.   Please note: manual typing as well as voice recognition software may have been used to produce this document - typos may escape review. Please contact Dr. Lyn Hollingshead for any needed clarifications.

## 2020-04-28 LAB — COMPLETE METABOLIC PANEL WITH GFR: Glucose, Bld: 131 mg/dL — ABNORMAL HIGH (ref 65–99)

## 2020-05-03 LAB — IRON,TIBC AND FERRITIN PANEL
%SAT: 3 % (calc) — ABNORMAL LOW (ref 16–45)
Ferritin: 7 ng/mL — ABNORMAL LOW (ref 16–154)
Iron: 11 ug/dL — ABNORMAL LOW (ref 40–190)
TIBC: 398 mcg/dL (calc) (ref 250–450)

## 2020-05-03 LAB — COMPLETE METABOLIC PANEL WITH GFR
AG Ratio: 1.3 (calc) (ref 1.0–2.5)
ALT: 8 U/L (ref 6–29)
AST: 10 U/L (ref 10–30)
Albumin: 3.9 g/dL (ref 3.6–5.1)
Alkaline phosphatase (APISO): 101 U/L (ref 31–125)
BUN/Creatinine Ratio: 11 (calc) (ref 6–22)
BUN: 14 mg/dL (ref 7–25)
CO2: 26 mmol/L (ref 20–32)
Calcium: 9.1 mg/dL (ref 8.6–10.2)
Chloride: 103 mmol/L (ref 98–110)
Creat: 1.24 mg/dL — ABNORMAL HIGH (ref 0.50–1.10)
GFR, Est African American: 65 mL/min/{1.73_m2} (ref >=60)
GFR, Est Non African American: 56 mL/min/{1.73_m2} — ABNORMAL LOW (ref >=60)
Globulin: 3.1 g/dL (calc) (ref 1.9–3.7)
Potassium: 4.2 mmol/L (ref 3.5–5.3)
Sodium: 138 mmol/L (ref 135–146)
Total Bilirubin: 0.2 mg/dL (ref 0.2–1.2)
Total Protein: 7 g/dL (ref 6.1–8.1)

## 2020-05-03 LAB — CBC WITH DIFFERENTIAL/PLATELET
HCT: 31 % — ABNORMAL LOW (ref 35.0–45.0)
Hemoglobin: 8.8 g/dL — ABNORMAL LOW (ref 11.7–15.5)
MCH: 20.2 pg — ABNORMAL LOW (ref 27.0–33.0)
MCHC: 28.4 g/dL — ABNORMAL LOW (ref 32.0–36.0)
MCV: 71.3 fL — ABNORMAL LOW (ref 80.0–100.0)
MPV: 9 fL (ref 7.5–12.5)
Platelets: 466 10*3/uL — ABNORMAL HIGH (ref 140–400)
RBC: 4.35 10*6/uL (ref 3.80–5.10)
RDW: 16.1 % — ABNORMAL HIGH (ref 11.0–15.0)
WBC: 11.6 10*3/uL — ABNORMAL HIGH (ref 3.8–10.8)

## 2020-05-03 LAB — LIPID PANEL
Cholesterol: 195 mg/dL (ref <200)
HDL: 36 mg/dL — ABNORMAL LOW (ref >=50)
Non-HDL Cholesterol (Calc): 159 mg/dL (calc) — ABNORMAL HIGH (ref <130)
Total CHOL/HDL Ratio: 5.4 (calc) — ABNORMAL HIGH (ref <5.0)
Triglycerides: 431 mg/dL — ABNORMAL HIGH (ref <150)

## 2020-05-03 LAB — RETICULOCYTES

## 2020-05-03 LAB — ERYTHROPOIETIN: Erythropoietin: 109.4 m[IU]/mL — ABNORMAL HIGH (ref 2.6–18.5)

## 2020-05-18 ENCOUNTER — Ambulatory Visit
Admission: RE | Admit: 2020-05-18 | Discharge: 2020-05-18 | Disposition: A | Discharge status: Home / Self Care | Payer: 59 | Source: Ambulatory Visit | Attending: Osteopathic Medicine | Admitting: Osteopathic Medicine

## 2020-05-18 ENCOUNTER — Other Ambulatory Visit: Payer: Self-pay

## 2020-05-18 ENCOUNTER — Other Ambulatory Visit: Payer: Self-pay | Admitting: Osteopathic Medicine

## 2020-05-18 DIAGNOSIS — R921 Mammographic calcification found on diagnostic imaging of breast: Secondary | ICD-10-CM

## 2020-05-24 ENCOUNTER — Encounter: Payer: Self-pay | Admitting: Osteopathic Medicine

## 2020-05-26 ENCOUNTER — Encounter: Payer: Self-pay | Admitting: Osteopathic Medicine

## 2020-05-26 ENCOUNTER — Telehealth (INDEPENDENT_AMBULATORY_CARE_PROVIDER_SITE_OTHER): Payer: 59 | Admitting: Osteopathic Medicine

## 2020-05-26 DIAGNOSIS — U071 COVID-19: Secondary | ICD-10-CM

## 2020-05-26 MED ORDER — IPRATROPIUM BROMIDE 0.06 % NA SOLN: 2.0000 | Freq: Four times a day (QID) | NASAL | 1 refills | Status: DC

## 2020-05-26 MED ORDER — GUAIFENESIN-CODEINE 100-10 MG/5ML PO SYRP: 5.0000 mL | ORAL_SOLUTION | Freq: Four times a day (QID) | ORAL | 0 refills | Status: DC | PRN

## 2020-05-26 NOTE — Patient Instructions (Addendum)
Over-the-Counter Medications & Home Remedies for Upper Respiratory Illness  Note: the following list assumes no pregnancy, normal liver & kidney function and no other drug interactions. Always ask a pharmacist or qualified medical provider if you have any questions!   Aches/Pains, Fever, Headache Acetaminophen (Tylenol) 500 mg tablets - take max 2 tablets (1000 mg) every 6 hours (4 times per day)  Ibuprofen (Motrin) 200 mg tablets - take max 4 tablets (800 mg) every 6 hours*  Sinus Congestion Prescription Atrovent as directed Nasal Saline if desired Oxymetolazone (Afrin, others) sparing use due to rebound congestion, NEVER use in kids Phenylephrine (Sudafed) 10 mg tablets every 4 hours (or the 12-hour formulation)* Diphenhydramine (Benadryl) 25 mg tablets - take max 2 tablets every 4 hours  Cough & Sore Throat Prescription cough pills or syrups as directed Dextromethorphan (Robitussin, others) - cough suppressant Guaifenesin (Robitussin, Mucinex, others) - expectorant (helps cough up mucus) (Dextromethorphan and Guaifenesin also come in a combination tablet) Lozenges w/ Benzocaine + Menthol (Cepacol) Honey - as much as you want! Teas which "coat the throat" - look for ingredients Elm Bark, Licorice Root, Marshmallow Root  Other Zinc Lozenges within 24 hours of symptoms onset - mixed evidence this shortens the duration of the common cold Don't waste your money on Vitamin C or Echinacea  *Caution in patients with high blood pressure

## 2020-05-26 NOTE — Progress Notes (Signed)
Telemedicine Visit via  Video & Audio (App used: MyChart)   I connected with Selena Meyer on 05/26/20 at 3:41 PM  by phone or  telemedicine application as noted above  I verified that I am speaking with or regarding  the correct patient using two identifiers.  Participants: Myself, Dr Sunnie Nielsen DO Patient: Selena Meyer Patient proxy if applicable: none Other, if applicable: none  Patient is at home I am in office at [facility]    I discussed the limitations of evaluation and management  by telemedicine and the availability of in person appointments.  The participant(s) above expressed understanding and  agreed to proceed with this appointment via telemedicine.       History of Present Illness: Selena Meyer is a 35 y.o. female who would like to discuss COVID infection    Onset: Cough and congestion, runny eyes 05/20/20 (6 days ago) 05/21/20 temp of 99.54F, significant fatigue Cough over next few days, decreased appetite  05/23/20 felt a bit better in terms of energy 05/24/20 husband was feeling sick w/ fever 101.54F and he was negative for COVID on at-home test that day. His job told him to get tested w/ their lab, and then was positive later that day  Pt (+)COVID also 05/24/20 Still having cough and congestion Taking Alka-Seltzer, some other OTC sinus / allergy medication.     Immunization History  Administered Date(s) Administered  . PPD Test 07/16/2017  . Tdap 01/10/2015      Observations/Objective: LMP 04/28/2020 (Exact Date)  BP Readings from Last 3 Encounters:  04/27/20 (!) 141/77  03/30/20 (!) 166/120  09/22/19 (!) 172/113   Exam: Normal Speech.  NAD  Lab and Radiology Results No results found for this or any previous visit (from the past 72 hour(s)). No results found.     Assessment and Plan: 35 y.o. female with The encounter diagnosis was COVID-19.   PDMP not reviewed this encounter. No orders of the defined types  were placed in this encounter.  Meds ordered this encounter  Medications  . guaiFENesin-codeine (ROBITUSSIN AC) 100-10 MG/5ML syrup    Sig: Take 5-10 mLs by mouth 4 (four) times daily as needed for cough or congestion.    Dispense:  180 mL    Refill:  0  . ipratropium (ATROVENT) 0.06 % nasal spray    Sig: Place 2 sprays into both nostrils 4 (four) times daily. As needed for runny nose / postnasal drip    Dispense:  15 mL    Refill:  1   Patient Instructions  Over-the-Counter Medications & Home Remedies for Upper Respiratory Illness  Note: the following list assumes no pregnancy, normal liver & kidney function and no other drug interactions. Always ask a pharmacist or qualified medical provider if you have any questions!   Aches/Pains, Fever, Headache Acetaminophen (Tylenol) 500 mg tablets - take max 2 tablets (1000 mg) every 6 hours (4 times per day)  Ibuprofen (Motrin) 200 mg tablets - take max 4 tablets (800 mg) every 6 hours*  Sinus Congestion Prescription Atrovent as directed Nasal Saline if desired Oxymetolazone (Afrin, others) sparing use due to rebound congestion, NEVER use in kids Phenylephrine (Sudafed) 10 mg tablets every 4 hours (or the 12-hour formulation)* Diphenhydramine (Benadryl) 25 mg tablets - take max 2 tablets every 4 hours  Cough & Sore Throat Prescription cough pills or syrups as directed Dextromethorphan (Robitussin, others) - cough suppressant Guaifenesin (Robitussin, Mucinex, others) - expectorant (helps cough up mucus) (Dextromethorphan  and Guaifenesin also come in a combination tablet) Lozenges w/ Benzocaine + Menthol (Cepacol) Honey - as much as you want! Teas which "coat the throat" - look for ingredients Elm Bark, Licorice Root, Marshmallow Root  Other Zinc Lozenges within 24 hours of symptoms onset - mixed evidence this shortens the duration of the common cold Don't waste your money on Vitamin C or Echinacea  *Caution in patients with high  blood pressure       Instructions sent via MyChart.  ER precautions reviewed    Follow Up Instructions: No follow-ups on file.    I discussed the assessment and treatment plan with the patient. The patient was provided an opportunity to ask questions and all were answered. The patient agreed with the plan and demonstrated an understanding of the instructions.   The patient was advised to call back or seek an in-person evaluation if any new concerns, if symptoms worsen or if the condition fails to improve as anticipated.  30 minutes of non-face-to-face time was provided during this encounter.      . . . . . . . . . . . . . [name]                   Historical information moved to improve visibility of documentation.  Past Medical History:  Diagnosis Date  . Bronchitis   . 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   [name] Hypertension   . Irregular menstrual cycle   . Irregular periods/menstrual cycles 05/03/2017   Will hold off on Provera or other fertility stuff for now, pt advised pregnancy is not recommended until BP under better control at least, plus other workup pen  . Proteinuria 02/28/2013  . Uncontrolled hypertension 05/10/2017   No past surgical history on file. Social History   Tobacco Use  . Smoking status: Former Smoker    Packs/day: 0.25    Years: 5.00    Pack years: 1.25    Types: Cigarettes  . Smokeless tobacco: Never Used  Substance Use Topics  . Alcohol use: Yes    Comment: "occasional use; social drinker"   family history includes Cancer in her maternal aunt and maternal grandmother; Diabetes in her maternal grandmother, mother, and sister; High blood pressure in her maternal grandmother and mother; Other in her father; Renal Disease in her mother.  Medications: Current Outpatient Medications   Medication Sig Dispense Refill  . acetaminophen (TYLENOL) 500 MG tablet Take 1,000 mg by mouth every 6 (six) hours as needed for mild pain.     [name] albuterol (VENTOLIN HFA) 108 (90 Base) MCG/ACT inhaler Inhale 2 puffs into the lungs every 6 (six) hours as needed for wheezing or shortness of breath. 1 each 1  . amLODipine (NORVASC) 10 MG tablet Take 1 tablet (10 mg total) by mouth daily. 90 tablet 3  . guaiFENesin-codeine (ROBITUSSIN AC) 100-10 MG/5ML syrup Take 5-10 mLs by mouth 4 (four) times daily as needed for cough or congestion. 180 mL 0  . hydrochlorothiazide (HYDRODIURIL) 25 MG tablet Take 1 tablet (25 mg total) by mouth daily. 90 tablet 3  . ipratropium (ATROVENT) 0.06 % nasal spray Place 2 sprays into both nostrils 4 (four) times daily. As needed for runny nose / postnasal drip 15 mL 1  . metoprolol (TOPROL-XL) 200 MG 24 hr tablet TAKE 1 TABLET BY MOUTH ONCE DAILY. TAKE WITH OR IMMEDIATELY  FOLLOWING A MEAL. 90 tablet 3  . Multiple Vitamins-Minerals (MULTIVITAMIN ADULT) CHEW Chew 2 each by mouth as needed.      No current facility-administered medications for this visit.   Allergies  Allergen Reactions  . Ibuprofen Hives     If phone visit, billing and coding can please add appropriate modifier if needed

## 2020-05-26 NOTE — Progress Notes (Signed)
Attempted to contact patient at 305 pm. No answer, left a detailed vm msg.

## 2020-06-02 ENCOUNTER — Telehealth: Payer: 59 | Admitting: Osteopathic Medicine

## 2020-08-03 ENCOUNTER — Ambulatory Visit: Payer: 59 | Admitting: Osteopathic Medicine

## 2020-10-27 ENCOUNTER — Telehealth: Payer: Self-pay | Admitting: General Practice

## 2020-10-27 NOTE — Telephone Encounter (Signed)
Transition Care Management Follow-up Telephone Call Date of discharge and from where: 10/25/20 from [facility] How have you been since you were released from the hospital? Doing better. Hasn't checked her BP again since she left the hospital. Any questions or concerns? No  Items Reviewed: Did the pt receive and understand the discharge instructions provided? Yes  Medications obtained and verified? Yes  Other? No  Any new allergies since your discharge? No  Dietary orders reviewed? Yes Do you have support at home? Yes   Home Care and Equipment/Supplies: Were home health services ordered? no  Functional Questionnaire: (I = Independent and D = Dependent) ADLs: I  Bathing/Dressing- I  Meal Prep- I  Eating- I  Maintaining continence- I  Transferring/Ambulation- I  Managing Meds- I  Follow up appointments reviewed:  PCP Hospital f/u appt confirmed? Yes  Scheduled to see Hyman Hopes, NP on 11/02/20 @ 0910. Specialist Hospital f/u appt confirmed? No   Are transportation arrangements needed? No  If their condition worsens, is the pt aware to call PCP or go to the Emergency Dept.? Yes Was the patient provided with contact information for the PCP's office or ED? Yes Was to pt encouraged to call back with questions or concerns? Yes

## 2020-11-01 DIAGNOSIS — I471 Supraventricular tachycardia, unspecified: Secondary | ICD-10-CM | POA: Insufficient documentation

## 2020-11-01 HISTORY — DX: Supraventricular tachycardia, unspecified: I47.10

## 2020-11-02 ENCOUNTER — Inpatient Hospital Stay: Payer: Self-pay | Admitting: Family Medicine

## 2020-11-02 DIAGNOSIS — E876 Hypokalemia: Secondary | ICD-10-CM

## 2020-11-02 HISTORY — DX: Hypokalemia: E87.6

## 2020-11-05 DIAGNOSIS — R0602 Shortness of breath: Secondary | ICD-10-CM | POA: Insufficient documentation

## 2020-11-08 ENCOUNTER — Ambulatory Visit (INDEPENDENT_AMBULATORY_CARE_PROVIDER_SITE_OTHER): Payer: Self-pay | Admitting: Family Medicine

## 2020-11-08 ENCOUNTER — Encounter: Payer: Self-pay | Admitting: Family Medicine

## 2020-11-08 VITALS — BP 132/90 | HR 82 | Temp 98.3°F | Wt 174.0 lb

## 2020-11-08 DIAGNOSIS — E876 Hypokalemia: Secondary | ICD-10-CM

## 2020-11-08 DIAGNOSIS — I471 Supraventricular tachycardia: Secondary | ICD-10-CM

## 2020-11-08 NOTE — Patient Instructions (Signed)
Continue cardiology workup  Labs today  Follow-up in 3 months after getting results from cardiology. Please contact office for sooner follow-up if symptoms do not improve or worsen. Seek emergency care if symptoms become severe.

## 2020-11-08 NOTE — Progress Notes (Signed)
Established Patient Office Visit  Subjective:  Patient ID: Selena Meyer, female    DOB: January 01, 1986  Age: 35 y.o. MRN: [id number]  CC:  Chief Complaint  Patient presents with   Follow-up    [facility] - svt    HPI Selena Meyer presents for hospital follow-up.  10/25/20 ED: Back pain, DDD, started Robaxin, Tramadol, Medrol Dosepak, referral to spine specialist - arthritis confirmed, PT ordered to start after cardiology appointment  11/01/20 ED: Martin Majestic back to work, still having back pain but more tolerable. Works at a daycare. About 2-3 hours later, she started feeling hot/cold, lightheaded, weak, trouble breathing, chest pain, hands/mouth/feet numb and cold. EMS care, elevated BP and HR (SVT/ST). ED thought steroids may have been causing her symptoms, but she had missed the day prior of meds so unsure. Kept overnight on telemetry - normal. EKG SR. Echo normal.  CT pulmonary angio normal. Troponins trended appropriately for ST/SVT.  11/05/20: Cardiology appointment. Changed metoprolol to 50 mg BID. Started magnesium. Recommended continue iron. 30-day Holter monitor ordered. F/u 6 weeks. Following magnesium.   11/08/2020 Hospital Follow-up at [facility]: Today patient reports she has been feeling somewhat better. She has seen cardiology with changes as above. She is going back tomorrow to have Holter monitor placed. Cardiology is replacing her magnesium and planning to recheck in a few weeks. She has not had any more severe episodes. States she did have some mild chest "cramping" sensation several days ago, but none since. Also reporting she still gets very tired/winded with prolonged physical activity.   Today she denies any chest pain, palpitations, shortness of breath, dizziness/lightheadedness, edema, n/v/d.   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 FSGS vs  MCD, biopsy 09/2017 podocytopathy but nondiagnostic.    Hypertension    Irregular menstrual cycle    Irregular periods/menstrual cycles 05/03/2017   Will hold off on Provera or other fertility stuff for now, pt advised pregnancy is not recommended until BP under better control at least, plus other workup pen   Proteinuria 02/28/2013   Uncontrolled hypertension 05/10/2017    No past surgical history on file.  Family History  Problem Relation Age of Onset   Diabetes Mother    High blood pressure Mother    Renal Disease Mother    Diabetes Sister    Cancer Maternal Grandmother    Diabetes Maternal Grandmother    High blood pressure Maternal Grandmother    Cancer Maternal Aunt        breast   Other Father     Social History   Socioeconomic History   Marital status: Married    Spouse name: Izell [place]   Number of children: 0   Years of education: Not on file   Highest education level: Not on file  Occupational History    Employer: UNEMPLOYED  Tobacco Use   Smoking status: Former    Packs/day: 0.25    Years: 5.00    Pack years: 1.25    Types: Cigarettes   Smokeless tobacco: Never  Vaping Use   Vaping Use: Never used  Substance and Sexual Activity   Alcohol use: Yes    Comment: "occasional use; social drinker"   Drug use: No   Sexual activity: Yes    Partners: Male    Birth control/protection: None  Other Topics Concern   Not on file  Social History Narrative   Not on file   Social Determinants of Health   Financial Resource Strain: Not on file  Food Insecurity: Not on file  Transportation Needs: Not on file  Physical Activity: Not on file  Stress: Not on file  Social Connections: Not on file  Intimate Partner Violence: Not on file    Outpatient Medications Prior to Visit  Medication Sig Dispense Refill   acetaminophen (TYLENOL) 500 MG tablet Take 1,000 mg by mouth every 6 (six) hours as needed for mild pain.      albuterol (VENTOLIN HFA) 108 (90 Base) MCG/ACT  inhaler Inhale 2 puffs into the lungs every 6 (six) hours as needed for wheezing or shortness of breath. 1 each 1   amLODipine (NORVASC) 10 MG tablet Take 1 tablet (10 mg total) by mouth daily. 90 tablet 3   ferrous sulfate 325 (65 FE) MG tablet Take by mouth.     ipratropium (ATROVENT) 0.06 % nasal spray Place 2 sprays into both nostrils 4 (four) times daily. As needed for runny nose / postnasal drip 15 mL 1   magnesium oxide (MAG-OX) 400 MG tablet Take 0.5 tablets by mouth daily.     methocarbamol (ROBAXIN) 500 MG tablet Take 500 mg by mouth 2 (two) times daily.     metoprolol tartrate (LOPRESSOR) 50 MG tablet Take by mouth.     Multiple Vitamins-Minerals (MULTIVITAMIN ADULT) CHEW Chew 2 each by mouth as needed.      traMADol (ULTRAM) 50 MG tablet Take 25-50 mg by mouth every 6 (six) hours as needed.     acetaminophen (TYLENOL) 500 MG tablet Take by mouth.     guaiFENesin-codeine (ROBITUSSIN AC) 100-10 MG/5ML syrup Take 5-10 mLs by mouth 4 (four) times daily as needed for cough or congestion. (Patient not taking: No sig reported) 180 mL 0   hydrochlorothiazide (HYDRODIURIL) 25 MG tablet Take 1 tablet (25 mg total) by mouth daily. (Patient not taking: No sig reported) 90 tablet 3   magnesium oxide (MAG-OX) 400 MG tablet Take by mouth. (Patient not taking: Reported on 11/08/2020)     metoprolol (TOPROL-XL) 200 MG 24 hr tablet TAKE 1 TABLET BY MOUTH ONCE DAILY. TAKE WITH OR IMMEDIATELY FOLLOWING A MEAL. (Patient not taking: No sig reported) 90 tablet 3   metoprolol tartrate (LOPRESSOR) 50 MG tablet Take 50 mg by mouth 2 (two) times daily.     No facility-administered medications prior to visit.    Allergies  Allergen Reactions   Ibuprofen Hives    ROS Review of Systems All review of systems negative except what is listed in the HPI    Objective:    Physical Exam Vitals reviewed.  Constitutional:      Appearance: Normal appearance.  HENT:     Head: Normocephalic and atraumatic.   Cardiovascular:     Rate and Rhythm: Normal rate and regular rhythm.     Heart sounds: Normal heart sounds.  Pulmonary:     Effort: Pulmonary effort is normal.     Breath sounds: Normal breath sounds.  Musculoskeletal:     Right lower leg: No edema.     Left lower leg: No edema.  Skin:    General: Skin is warm and dry.     Capillary Refill: Capillary refill takes less than 2 seconds.  Neurological:     Mental Status: She is alert and oriented to person, place, and time.  Psychiatric:        Mood and Affect: Mood  normal.        Behavior: Behavior normal.        Thought Content: Thought content normal.        Judgment: Judgment normal.    BP 132/90 (BP Location: Left Arm, Patient Position: Sitting, Cuff Size: Large)   Pulse 82   Temp 98.3 F (36.8 C) (Oral)   Wt 174 lb 0.6 oz (78.9 kg)   SpO2 100%   BMI 28.96 kg/m  Wt Readings from Last 3 Encounters:  11/08/20 174 lb 0.6 oz (78.9 kg)  04/27/20 182 lb 1.9 oz (82.6 kg)  03/30/20 182 lb (82.6 kg)     Health Maintenance Due  Topic Date Due   HIV Screening  Never done   Hepatitis C Screening  Never done    There are no preventive care reminders to display for this patient.  Lab Results  Component Value Date   TSH 1.62 04/26/2017   Lab Results  Component Value Date   WBC 11.6 (H) 04/26/2020   WBC 11.6 (H) 04/26/2020   HGB 8.8 (L) 04/26/2020   HGB 8.8 (L) 04/26/2020   HCT 31.0 (L) 04/26/2020   HCT 31.0 (L) 04/26/2020   MCV 71.3 (L) 04/26/2020   MCV 71.3 (L) 04/26/2020   PLT 466 (H) 04/26/2020   PLT 466 (H) 04/26/2020   Lab Results  Component Value Date   NA 138 04/26/2020   K 4.2 04/26/2020   CO2 26 04/26/2020   GLUCOSE 131 (H) 04/26/2020   BUN 14 04/26/2020   CREATININE 1.24 (H) 04/26/2020   BILITOT 0.2 04/26/2020   AST 10 04/26/2020   ALT 8 04/26/2020   PROT 7.0 04/26/2020   CALCIUM 9.1 04/26/2020   ANIONGAP 10 09/14/2017   Lab Results  Component Value Date   CHOL 195 04/26/2020   Lab Results   Component Value Date   HDL 36 (L) 04/26/2020   Lab Results  Component Value Date   [facility]  04/26/2020     Comment:     . LDL cholesterol not calculated. Triglyceride levels greater than 400 mg/dL invalidate calculated LDL results. . Reference range: <100 . Desirable range <100 mg/dL for primary prevention;   <70 mg/dL for patients with CHD or diabetic patients  with > or = 2 CHD risk factors. [name] LDL-C is now calculated using the Martin-Hopkins  calculation, which is a validated novel method providing  better accuracy than the Friedewald equation in the  estimation of LDL-C.  Cresenciano Genre et al. Annamaria Helling. [id number]): 2061-2068  (http://education.[facility].com/faq/FAQ164)    Lab Results  Component Value Date   TRIG 431 (H) 04/26/2020   Lab Results  Component Value Date   CHOLHDL 5.4 (H) 04/26/2020   No results found for: HGBA1C    Assessment & Plan:   1. SVT (supraventricular tachycardia) ([place]) 2. Hypokalemia Holter monitor to be placed tomorrow.  Continue all medications as prescribed.  Recheck magnesium  for per cardiologist prior to next follow-up.  We will recheck CMP with potassium today.  She is also planning to repeat her anemia labs that were ordered last spring and never done.  Orders remain active. Continue monitoring symptoms and avoiding strenuous activity. Patient aware of signs/symptoms requiring further/urgent evaluation.  - COMPLETE METABOLIC PANEL WITH GFR   Follow-up: Return in about 3 months (around 02/08/2021) for anemia f/u, est with Joy.    Terrilyn Saver, NP

## 2020-11-09 LAB — COMPLETE METABOLIC PANEL WITH GFR
AG Ratio: 1.2 (calc) (ref 1.0–2.5)
ALT: 12 U/L (ref 6–29)
AST: 11 U/L (ref 10–30)
Albumin: 3.6 g/dL (ref 3.6–5.1)
Alkaline phosphatase (APISO): 77 U/L (ref 31–125)
BUN/Creatinine Ratio: 13 (calc) (ref 6–22)
BUN: 16 mg/dL (ref 7–25)
CO2: 25 mmol/L (ref 20–32)
Calcium: 9.4 mg/dL (ref 8.6–10.2)
Chloride: 104 mmol/L (ref 98–110)
Creat: 1.21 mg/dL — ABNORMAL HIGH (ref 0.50–0.97)
Globulin: 3.1 g/dL (calc) (ref 1.9–3.7)
Glucose, Bld: 140 mg/dL — ABNORMAL HIGH (ref 65–99)
Potassium: 4.6 mmol/L (ref 3.5–5.3)
Sodium: 137 mmol/L (ref 135–146)
Total Bilirubin: 0.3 mg/dL (ref 0.2–1.2)
Total Protein: 6.7 g/dL (ref 6.1–8.1)
eGFR: 59 mL/min/{1.73_m2} — ABNORMAL LOW (ref >=60)

## 2020-11-10 ENCOUNTER — Other Ambulatory Visit: Payer: Self-pay | Admitting: Family Medicine

## 2020-11-10 DIAGNOSIS — D509 Iron deficiency anemia, unspecified: Secondary | ICD-10-CM

## 2020-11-10 LAB — CBC WITH DIFFERENTIAL/PLATELET
Absolute Monocytes: 666 cells/uL (ref 200–950)
Basophils Absolute: 85 cells/uL (ref 0–200)
Basophils Relative: 0.7 %
Eosinophils Absolute: 182 cells/uL (ref 15–500)
Eosinophils Relative: 1.5 %
HCT: 29.6 % — ABNORMAL LOW (ref 35.0–45.0)
Hemoglobin: 8.6 g/dL — ABNORMAL LOW (ref 11.7–15.5)
Lymphs Abs: 2154 cells/uL (ref 850–3900)
MCH: 20.5 pg — ABNORMAL LOW (ref 27.0–33.0)
MCHC: 29.1 g/dL — ABNORMAL LOW (ref 32.0–36.0)
MCV: 70.5 fL — ABNORMAL LOW (ref 80.0–100.0)
MPV: 8.9 fL (ref 7.5–12.5)
Monocytes Relative: 5.5 %
Neutro Abs: 9015 cells/uL — ABNORMAL HIGH (ref 1500–7800)
Neutrophils Relative %: 74.5 %
Platelets: 656 10*3/uL — ABNORMAL HIGH (ref 140–400)
RBC: 4.2 10*6/uL (ref 3.80–5.10)
RDW: 17 % — ABNORMAL HIGH (ref 11.0–15.0)
Total Lymphocyte: 17.8 %
WBC: 12.1 10*3/uL — ABNORMAL HIGH (ref 3.8–10.8)

## 2020-11-10 LAB — RETICULOCYTES
ABS Retic: 63000 cells/uL (ref 20000–80000)
Retic Ct Pct: 1.5 %

## 2020-11-10 LAB — ERYTHROPOIETIN: Erythropoietin: 90 m[IU]/mL — ABNORMAL HIGH (ref 2.6–18.5)

## 2020-11-10 LAB — IRON,TIBC AND FERRITIN PANEL
%SAT: 10 % (calc) — ABNORMAL LOW (ref 16–45)
Ferritin: 16 ng/mL (ref 16–154)
Iron: 33 ug/dL — ABNORMAL LOW (ref 40–190)
TIBC: 344 mcg/dL (calc) (ref 250–450)

## 2020-11-10 LAB — PATHOLOGIST SMEAR REVIEW

## 2020-11-11 NOTE — Addendum Note (Signed)
Addended by: Hyman Hopes B on: 11/11/2020 08:53 AM   Modules accepted: Orders

## 2020-11-12 ENCOUNTER — Encounter: Payer: Self-pay | Admitting: Medical-Surgical

## 2021-02-08 ENCOUNTER — Ambulatory Visit: Payer: Self-pay | Admitting: Medical-Surgical

## 2021-04-20 ENCOUNTER — Encounter: Payer: Self-pay | Admitting: Medical-Surgical

## 2021-04-20 ENCOUNTER — Ambulatory Visit (INDEPENDENT_AMBULATORY_CARE_PROVIDER_SITE_OTHER): Payer: BLUE CROSS/BLUE SHIELD | Admitting: Medical-Surgical

## 2021-04-20 VITALS — BP 135/86 | HR 64 | Resp 20 | Ht 65.0 in | Wt 179.4 lb

## 2021-04-20 DIAGNOSIS — R7309 Other abnormal glucose: Secondary | ICD-10-CM

## 2021-04-20 DIAGNOSIS — R0602 Shortness of breath: Secondary | ICD-10-CM

## 2021-04-20 DIAGNOSIS — E782 Mixed hyperlipidemia: Secondary | ICD-10-CM | POA: Diagnosis not present

## 2021-04-20 DIAGNOSIS — I151 Hypertension secondary to other renal disorders: Secondary | ICD-10-CM

## 2021-04-20 DIAGNOSIS — D509 Iron deficiency anemia, unspecified: Secondary | ICD-10-CM

## 2021-04-20 DIAGNOSIS — N2889 Other specified disorders of kidney and ureter: Secondary | ICD-10-CM

## 2021-04-20 MED ORDER — ALBUTEROL SULFATE HFA 108 (90 BASE) MCG/ACT IN AERS: 2.0000 | INHALATION_SPRAY | Freq: Four times a day (QID) | RESPIRATORY_TRACT | 1 refills | Status: AC | PRN

## 2021-04-20 MED ORDER — AMLODIPINE BESYLATE 10 MG PO TABS: 10.0000 mg | ORAL_TABLET | Freq: Every day | ORAL | 3 refills | Status: DC

## 2021-04-20 NOTE — Progress Notes (Signed)
?  HPI with pertinent ROS:  ? ?CC: Transfer of care, anemia follow-up ? ?HPI: ?Pleasant 36 year old female presenting today to transfer care to a new PCP and for the following: ? ?Hypertension- taking Amlodipine 10mg  daily and metoprolol 100mg  twice daily. She is being managed by Cardiology and has plans to discuss her Metoprolol with them at her next visit. Denies CP, SOB, palpitations, lower extremity edema, dizziness, headaches, or vision changes. Reports Cardiology wanted to know if she was cleared to walk on an inclined treadmill so she can complete her cardiac stress test.  ? ?CKD- no dysuria, pruritis, weakness, unusual fatigue, or changes in mental status. Working to stay hydrated and avoid nephrotoxic medications.  ? ?Prediabetes- last A1c checked in October was 5.4% ?  ?Hyperlipidemia- cholesterol checked last week and is elevated. Cardiology recommended starting a statin medication. She has not started it yet.  ? ?Anemia- history of iron deficiency anemia. Would like to have her labs checked to make sure this is stable. Last CBC showed anemia with marked microcytosis and hypochromia.  ? ?I reviewed the past medical history, family history, social history, surgical history, and allergies today and no changes were needed.  Please see the problem list section below in epic for further details. ? ? ?Physical exam:  ? ?General: Well Developed, well nourished, and in no acute distress.  ?Neuro: Alert and oriented x3.  ?HEENT: Normocephalic, atraumatic.  ?Skin: Warm and dry. ?Cardiac: Regular rate and rhythm, no murmurs rubs or gallops, no lower extremity edema.  ?Respiratory: Clear to auscultation bilaterally. Not using accessory muscles, speaking in full sentences. ? ?Impression and Recommendations:   ? ?1. Iron deficiency anemia, unspecified iron deficiency anemia type ?Checking iron panel today.  ?- Fe+TIBC+Fer ? ?2. Hypertension secondary to other renal disorders ?3. Elevated cholesterol with high  triglycerides ?Managed by Cardiology. Recommend starting a medication to manage high cholesterol and reduce risk ASCVD risk. Ok to walk on an incline treadmill for the cardiac stress test.  ? ?4. Elevated hemoglobin A1c measurement ?Normal at last chek.  ? ?5. SOB (shortness of breath) ?Refilling Albuterol inhaler.  ? ?Return in about 6 months (around 10/20/2021) for chronic disease follow up. ?___________________________________________ ?November, DNP, APRN, FNP-BC ?Primary Care and Sports Medicine ?[facility] 12/20/2021 ?

## 2021-04-21 LAB — IRON,TIBC AND FERRITIN PANEL
%SAT: 5 % (calc) — ABNORMAL LOW (ref 16–45)
Ferritin: 8 ng/mL — ABNORMAL LOW (ref 16–154)
Iron: 18 ug/dL — ABNORMAL LOW (ref 40–190)
TIBC: 374 mcg/dL (calc) (ref 250–450)

## 2021-05-05 ENCOUNTER — Encounter: Payer: Self-pay | Admitting: Emergency Medicine

## 2021-05-05 ENCOUNTER — Emergency Department (INDEPENDENT_AMBULATORY_CARE_PROVIDER_SITE_OTHER)
Admission: EM | Admit: 2021-05-05 | Discharge: 2021-05-05 | Disposition: A | Discharge status: Home / Self Care | Payer: BC Managed Care – PPO | Source: Home / Self Care

## 2021-05-05 ENCOUNTER — Other Ambulatory Visit: Payer: Self-pay

## 2021-05-05 ENCOUNTER — Emergency Department (INDEPENDENT_AMBULATORY_CARE_PROVIDER_SITE_OTHER): Payer: BC Managed Care – PPO

## 2021-05-05 DIAGNOSIS — I471 Supraventricular tachycardia: Secondary | ICD-10-CM | POA: Diagnosis not present

## 2021-05-05 DIAGNOSIS — I10 Essential (primary) hypertension: Secondary | ICD-10-CM | POA: Diagnosis not present

## 2021-05-05 DIAGNOSIS — R1084 Generalized abdominal pain: Secondary | ICD-10-CM | POA: Diagnosis not present

## 2021-05-05 DIAGNOSIS — R0602 Shortness of breath: Secondary | ICD-10-CM | POA: Diagnosis not present

## 2021-05-05 DIAGNOSIS — R0789 Other chest pain: Secondary | ICD-10-CM | POA: Diagnosis not present

## 2021-05-05 LAB — POCT URINALYSIS DIP (MANUAL ENTRY)
Bilirubin, UA: NEGATIVE
Glucose, UA: NEGATIVE mg/dL
Ketones, POC UA: NEGATIVE mg/dL
Leukocytes, UA: NEGATIVE
Nitrite, UA: NEGATIVE
Protein Ur, POC: 100 mg/dL — AB
Spec Grav, UA: >=1.03 — AB (ref 1.010–1.025)
Urobilinogen, UA: 0.2 E.U./dL
pH, UA: 5 (ref 5.0–8.0)

## 2021-05-05 NOTE — ED Provider Notes (Signed)
?[facility] ? ? ? ?CSN: [id number] ?Arrival date & time: 05/05/21  1238 ? ? ?  ? ?History   ?Chief Complaint ?Chief Complaint  ?Patient presents with  ? Abdominal Pain  ? ? ?HPI ?Selena Meyer is a 36 y.o. female.  ? ?HPI 36 year old female presents with lower abdominal pain for 4 days.  Patient reports regular bowel movements and denies constipation.  Patient is currently menstruating and has PMH of irregular menstrual cycles, polycystic ovaries, and HTN. ? ?Past Medical History:  ?Diagnosis Date  ? Bronchitis   ? 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 Following with [facility] q6 mos Dr Jannifer Hick -. 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   ? Hypertension   ? Irregular menstrual cycle   ? Irregular periods/menstrual cycles 05/03/2017  ? Will hold off on Provera or other fertility stuff for now, pt advised pregnancy is not recommended until BP under better control at least, plus other workup pen  ? Proteinuria 02/28/2013  ? Uncontrolled hypertension 05/10/2017  ? ? ?Patient Active Problem List  ? Diagnosis Date Noted  ? SOB (shortness of breath) 11/05/2020  ? Hypokalemia 11/02/2020  ? SVT (supraventricular tachycardia) ([place]) 11/01/2020  ? Stage 2 chronic kidney disease 04/27/2020  ? Elevated cholesterol with high triglycerides 04/27/2020  ? Elevated hemoglobin A1c measurement 04/27/2020  ? Anemia 04/27/2020  ? Hypertension secondary to other renal disorders 04/27/2020  ? Lumbar radiculopathy 09/22/2019  ? 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 Episodic tension-type headache, not intractable 08/06/2017  ? Infertility counseling 08/06/2017  ? Systolic murmur [id number]  ? Uncontrolled hypertension 05/10/2017  ? Irregular periods/menstrual cycles 05/03/2017  ? Polycystic ovaries 11/09/2015  ? At risk for infertility 03/30/2015  ? Amenorrhea 03/30/2015  ? Hypertensive urgency 02/28/2013  ? Tobacco abuse 02/28/2013  ? Proteinuria 02/28/2013   ? ? ?History reviewed. No pertinent surgical history. ? ?OB History   ?No obstetric history on file. ?  ? ? ? ?Home Medications   ? ?Prior to Admission medications   ?Medication Sig Start Date End Date Taking? Authorizing Provider  ?fenofibrate (TRICOR) 145 MG tablet Take 145 mg by mouth daily.   Yes [provider]  ?acetaminophen (TYLENOL) 500 MG tablet Take 1,000 mg by mouth every 6 (six) hours as needed for mild pain.     [provider]  ?albuterol (VENTOLIN HFA) 108 (90 Base) MCG/ACT inhaler Inhale 2 puffs into the lungs every 6 (six) hours as needed for wheezing or shortness of breath. 04/20/21   Samuel Bouche, NP  ?amLODipine (NORVASC) 10 MG tablet Take 1 tablet (10 mg total) by mouth daily. 04/20/21   Samuel Bouche, NP  ?ferrous sulfate 325 (65 FE) MG tablet Take by mouth. 06/02/20   [provider]  ?ipratropium (ATROVENT) 0.06 % nasal spray Place 2 sprays into both nostrils 4 (four) times daily. As needed for runny nose / postnasal drip 05/26/20   Emeterio Reeve, DO  ?magnesium oxide (MAG-OX) 400 MG tablet Take 0.5 tablets by mouth daily. 11/05/20   [provider]  ?metoprolol tartrate (LOPRESSOR) 50 MG tablet Take 50 mg by mouth 2 (two) times daily. 11/05/20   [provider]  ?metoprolol tartrate (LOPRESSOR) 50 MG tablet Take by mouth. 11/05/20   [provider]  ?Multiple Vitamins-Minerals (MULTIVITAMIN ADULT) CHEW Chew 2 each by mouth as needed.     [provider]  ?Omega-3 Fatty Acids (FISH OIL) 1000 MG CAPS Take by mouth.  [provider]  ? ? ?Family History ?Family History  ?Problem Relation Age of Onset  ? Diabetes Mother   ? High blood pressure Mother   ? Renal Disease Mother   ? Diabetes Sister   ? Cancer Maternal Grandmother   ? Diabetes Maternal Grandmother   ? High blood pressure Maternal Grandmother   ? Cancer Maternal Aunt   ?     breast  ? Other Father   ? ? ?Social History ?Social History  ? ?Tobacco Use  ? Smoking  status: Former  ?  Packs/day: 0.25  ?  Years: 5.00  ?  Pack years: 1.25  ?  Types: Cigarettes  ? Smokeless tobacco: Never  ?Vaping Use  ? Vaping Use: Never used  ?Substance Use Topics  ? Alcohol use: Yes  ?  Comment: "occasional use; social drinker"  ? Drug use: No  ? ? ? ?Allergies   ?Ibuprofen ? ? ?Review of Systems ?Review of Systems  ?Gastrointestinal:  Positive for abdominal pain and nausea.  ?All other systems reviewed and are negative. ? ? ?Physical Exam ?Triage Vital Signs ?ED Triage Vitals  ?Enc Vitals Group  ?   BP 05/05/21 1300 133/85  ?   Pulse Rate 05/05/21 1300 65  ?   Resp 05/05/21 1300 16  ?   Temp 05/05/21 1300 99 ?F (37.2 ?C)  ?   Temp Source 05/05/21 1300 Oral  ?   SpO2 05/05/21 1300 100 %  ?   Weight 05/05/21 1301 173 lb (78.5 kg)  ?   Height 05/05/21 1301 5\' 5"  (1.651 m)  ?   Head Circumference --   ?   Peak Flow --   ?   Pain Score 05/05/21 1301 5  ?   Pain Loc --   ?   Pain Edu? --   ?   Excl. in [place]? --   ? ?No data found. ? ?Updated Vital Signs ?BP 133/85 (BP Location: Left Arm)   Pulse 65   Temp 99 ?F (37.2 ?C) (Oral)   Resp 16   Ht 5\' 5"  (1.651 m)   Wt 173 lb (78.5 kg)   LMP 05/01/2021 (Exact Date)   SpO2 100%   BMI 28.79 kg/m?  ? ?  ? ?Physical Exam ?Vitals and nursing note reviewed.  ?Constitutional:   ?   Appearance: She is normal weight.  ?HENT:  ?   Head: Normocephalic and atraumatic.  ?   Mouth/Throat:  ?   Mouth: Mucous membranes are moist.  ?   Pharynx: Oropharynx is clear.  ?Eyes:  ?   Extraocular Movements: Extraocular movements intact.  ?   Pupils: Pupils are equal, round, and reactive to light.  ?Cardiovascular:  ?   Rate and Rhythm: Normal rate and regular rhythm.  ?   Heart sounds: Normal heart sounds. No murmur heard. ?Pulmonary:  ?   Effort: Pulmonary effort is normal.  ?   Breath sounds: Normal breath sounds. No wheezing, rhonchi or rales.  ?Abdominal:  ?   General: Abdomen is flat and scaphoid. Bowel sounds are absent.  ?   Palpations: Abdomen is soft. There is no  shifting dullness, fluid wave, hepatomegaly, splenomegaly, mass or pulsatile mass.  ?   Tenderness: There is abdominal tenderness in the right upper quadrant, right lower quadrant, left upper quadrant and left lower quadrant. There is no right CVA tenderness, left CVA tenderness, guarding or rebound. Negative signs include Murphy's sign and McBurney's sign.  ?  Hernia: No hernia is present.  ?Skin: ?   General: Skin is warm and dry.  ?Neurological:  ?   General: No focal deficit present.  ?   Mental Status: She is alert and oriented to person, place, and time.  ? ? ? ?UC Treatments / Results  ?Labs ?(all labs ordered are listed, but only abnormal results are displayed) ?Labs Reviewed  ?POCT URINALYSIS DIP (MANUAL ENTRY) - Abnormal; Notable for the following components:  ?    Result Value  ? Color, UA straw (*)   ? Spec Grav, UA >=1.030 (*)   ? Blood, UA trace-intact (*)   ? Protein Ur, POC =100 (*)   ? All other components within normal limits  ? ? ?EKG ? ? ?Radiology ?US Abdomen Complete ? ?Result Date: 05/05/2021 ?CLINICAL DATA:  Acute generalized abdominal pain. EXAM: ABDOMEN ULTRASOUND COMPLETE COMPARISON:  July 10, 2007. FINDINGS: Gallbladder: No gallstones or wall thickening visualized. No sonographic Murphy sign noted by sonographer. Common bile duct: Diameter: 4 mm which is within normal limits. Liver: No focal lesion identified. Within normal limits in parenchymal echogenicity. Portal vein is patent on color Doppler imaging with normal direction of blood flow towards the liver. IVC: No abnormality visualized. Pancreas: Visualized portion unremarkable. Spleen: Size and appearance within normal limits. Right Kidney: Length: 9.3 cm. Echogenicity within normal limits. No mass or hydronephrosis visualized. Left Kidney: Length: 10.3 cm. Echogenicity within normal limits. No mass or hydronephrosis visualized. Abdominal aorta: No aneurysm visualized. Other findings: None. IMPRESSION: No significant abnormality seen  in the abdomen. Electronically Signed   By: Marijo Conception M.D.   On: 05/05/2021 14:23   ? ?Procedures ?Procedures (including critical care time) ? ?Medications Ordered in UC ?Medications - No data to display ? ?[facility]

## 2021-05-05 NOTE — Discharge Instructions (Addendum)
Advised/informed patient of complete ultrasound of abdomen results, with hard copy provided to patient.  Advised patient if symptoms worsen and/or unresolved please follow-up with PCP or GI for further evaluation.  Advised patient may use OTC Tylenol 1000 mg 1-3 times daily for menstrual cramps.  Advised patient we will follow-up with urine culture results once received. ?

## 2021-05-05 NOTE — ED Triage Notes (Signed)
Abdominal pain x 4 days lower, nausea. Currently menstruating. ?Regular bowel movements ?No urinary sx  ?

## 2021-10-20 ENCOUNTER — Ambulatory Visit: Payer: BLUE CROSS/BLUE SHIELD | Admitting: Medical-Surgical

## 2021-11-04 DIAGNOSIS — E781 Pure hyperglyceridemia: Secondary | ICD-10-CM | POA: Diagnosis not present

## 2021-11-04 DIAGNOSIS — I151 Hypertension secondary to other renal disorders: Secondary | ICD-10-CM | POA: Diagnosis not present

## 2021-11-04 DIAGNOSIS — I471 Supraventricular tachycardia, unspecified: Secondary | ICD-10-CM | POA: Diagnosis not present

## 2021-11-04 DIAGNOSIS — R002 Palpitations: Secondary | ICD-10-CM | POA: Diagnosis not present

## 2021-11-15 ENCOUNTER — Ambulatory Visit (INDEPENDENT_AMBULATORY_CARE_PROVIDER_SITE_OTHER): Payer: BC Managed Care – PPO | Admitting: Medical-Surgical

## 2021-11-15 ENCOUNTER — Encounter: Payer: Self-pay | Admitting: Medical-Surgical

## 2021-11-15 VITALS — BP 119/74 | HR 66 | Ht 65.0 in | Wt 184.0 lb

## 2021-11-15 DIAGNOSIS — E782 Mixed hyperlipidemia: Secondary | ICD-10-CM

## 2021-11-15 DIAGNOSIS — D509 Iron deficiency anemia, unspecified: Secondary | ICD-10-CM | POA: Diagnosis not present

## 2021-11-15 DIAGNOSIS — I151 Hypertension secondary to other renal disorders: Secondary | ICD-10-CM | POA: Diagnosis not present

## 2021-11-15 NOTE — Progress Notes (Signed)
Established Patient Office Visit  Subjective   Patient ID: Selena Meyer, female   DOB: 10-07-1985 Age: 36 y.o. MRN: [id number]   Chief Complaint  Patient presents with   iron deficiency    HPI Pleasant 36 year old female presenting today for the following:  Iron deficiency: has been taking oral iron daily as recommended. Tolerating the medication well without side effects. Previously recommended talking with Hematology but was not open to the idea at the time.   Hyperlipidemia/hypertriglyceridemia: taking fish oil and fenofibrate 145mg  as prescribed . Tolerating both well with no side effects. Per notes, cardiology recommends checking lipids/CMP per PCP.   Hypertension: Managed by cardiology. BP looks good today.   Melatonin: has been using Melatonin spray to help with sleep at night.  Reports this has been somewhat helpful but she is wondering if melatonin is not safe to use in patients with high blood pressure.  She was told at one point, that the with the decrease blood pressure and that this tends to try it.   Objective:    Vitals:   11/15/21 1325  BP: 119/74  Pulse: 66  Height: 5\' 5"  (1.651 m)  Weight: 184 lb (83.5 kg)  SpO2: 100%  BMI (Calculated): 30.62    Physical Exam Vitals and nursing note reviewed.  Constitutional:      General: She is not in acute distress.    Appearance: Normal appearance. She is obese. She is not ill-appearing.  HENT:     Head: Normocephalic and atraumatic.  Cardiovascular:     Rate and Rhythm: Normal rate and regular rhythm.     Pulses: Normal pulses.     Heart sounds: Normal heart sounds.  Pulmonary:     Effort: Pulmonary effort is normal. No respiratory distress.     Breath sounds: Normal breath sounds. No wheezing, rhonchi or rales.  Skin:    General: Skin is warm and dry.  Neurological:     Mental Status: She is alert and oriented to person, place, and time.  Psychiatric:        Mood and Affect: Mood normal.        Behavior:  Behavior normal.        Thought Content: Thought content normal.        Judgment: Judgment normal.   No results found for this or any previous visit (from the past 24 hour(s)).     The ASCVD Risk score (Arnett DK, et al., 2019) failed to calculate for the following reasons:   The 2019 ASCVD risk score is only valid for ages 76 to 22   Assessment & Plan:   1. Iron deficiency anemia, unspecified iron deficiency anemia type Checking CBC and iron panel today.  Unfortunately, she has not responded well to oral replacement in the past so suspect that we will need to get her in for iron infusions.  We will go ahead and place a referral to hematology today to get the process started. - CBC - Iron, TIBC and Ferritin Panel - Ambulatory referral to Hematology / Oncology  2. Elevated cholesterol with high triglycerides Continue fenofibrate and fish oil as prescribed.  Checking CMP and lipid panel today.  3. Hypertension secondary to other renal disorders Managed by cardiology.  Checking labs as below. - CBC - COMPLETE METABOLIC PANEL WITH GFR - Lipid panel   Return in about 6 months (around 05/16/2022) for chronic disease follow up.  ___________________________________________ Clearnce Sorrel, DNP, APRN, FNP-BC Primary Care and Sports Medicine  [place]

## 2021-11-16 LAB — COMPLETE METABOLIC PANEL WITH GFR
AG Ratio: 1.3 (calc) (ref 1.0–2.5)
ALT: 11 U/L (ref 6–29)
AST: 13 U/L (ref 10–30)
Albumin: 3.8 g/dL (ref 3.6–5.1)
Alkaline phosphatase (APISO): 77 U/L (ref 31–125)
BUN/Creatinine Ratio: 12 (calc) (ref 6–22)
BUN: 16 mg/dL (ref 7–25)
CO2: 30 mmol/L (ref 20–32)
Calcium: 9.6 mg/dL (ref 8.6–10.2)
Chloride: 101 mmol/L (ref 98–110)
Creat: 1.34 mg/dL — ABNORMAL HIGH (ref 0.50–0.97)
Globulin: 2.9 g/dL (calc) (ref 1.9–3.7)
Glucose, Bld: 115 mg/dL — ABNORMAL HIGH (ref 65–99)
Potassium: 4.1 mmol/L (ref 3.5–5.3)
Sodium: 137 mmol/L (ref 135–146)
Total Bilirubin: 0.3 mg/dL (ref 0.2–1.2)
Total Protein: 6.7 g/dL (ref 6.1–8.1)
eGFR: 52 mL/min/{1.73_m2} — ABNORMAL LOW (ref >=60)

## 2021-11-16 LAB — LIPID PANEL
Cholesterol: 240 mg/dL — ABNORMAL HIGH (ref <200)
HDL: 40 mg/dL — ABNORMAL LOW (ref >=50)
Non-HDL Cholesterol (Calc): 200 mg/dL (calc) — ABNORMAL HIGH (ref <130)
Total CHOL/HDL Ratio: 6 (calc) — ABNORMAL HIGH (ref <5.0)
Triglycerides: 492 mg/dL — ABNORMAL HIGH (ref <150)

## 2021-11-16 LAB — CBC
HCT: 39.1 % (ref 35.0–45.0)
Hemoglobin: 12.9 g/dL (ref 11.7–15.5)
MCH: 28.9 pg (ref 27.0–33.0)
MCHC: 33 g/dL (ref 32.0–36.0)
MCV: 87.7 fL (ref 80.0–100.0)
MPV: 8.5 fL (ref 7.5–12.5)
Platelets: 447 10*3/uL — ABNORMAL HIGH (ref 140–400)
RBC: 4.46 10*6/uL (ref 3.80–5.10)
RDW: 14 % (ref 11.0–15.0)
WBC: 10.8 10*3/uL (ref 3.8–10.8)

## 2021-11-16 LAB — IRON,TIBC AND FERRITIN PANEL
%SAT: 13 % (calc) — ABNORMAL LOW (ref 16–45)
Ferritin: 15 ng/mL — ABNORMAL LOW (ref 16–154)
Iron: 46 ug/dL (ref 40–190)
TIBC: 353 mcg/dL (calc) (ref 250–450)

## 2021-12-05 ENCOUNTER — Encounter: Payer: BC Managed Care – PPO | Admitting: Family

## 2021-12-05 ENCOUNTER — Other Ambulatory Visit: Payer: BC Managed Care – PPO

## 2021-12-09 DIAGNOSIS — Z419 Encounter for procedure for purposes other than remedying health state, unspecified: Secondary | ICD-10-CM | POA: Diagnosis not present

## 2021-12-12 ENCOUNTER — Encounter: Payer: Self-pay | Admitting: Medical-Surgical

## 2021-12-16 ENCOUNTER — Inpatient Hospital Stay (HOSPITAL_BASED_OUTPATIENT_CLINIC_OR_DEPARTMENT_OTHER): Payer: BC Managed Care – PPO | Admitting: Family

## 2021-12-16 ENCOUNTER — Other Ambulatory Visit: Payer: Self-pay

## 2021-12-16 ENCOUNTER — Encounter: Payer: Self-pay | Admitting: Family

## 2021-12-16 ENCOUNTER — Inpatient Hospital Stay: Payer: BC Managed Care – PPO | Attending: Hematology & Oncology

## 2021-12-16 ENCOUNTER — Other Ambulatory Visit: Payer: Self-pay | Admitting: Family

## 2021-12-16 VITALS — BP 132/83 | HR 75 | Temp 98.1°F | Resp 18 | Ht 65.0 in | Wt 184.1 lb

## 2021-12-16 DIAGNOSIS — N920 Excessive and frequent menstruation with regular cycle: Secondary | ICD-10-CM | POA: Diagnosis not present

## 2021-12-16 DIAGNOSIS — Z87891 Personal history of nicotine dependence: Secondary | ICD-10-CM | POA: Diagnosis not present

## 2021-12-16 DIAGNOSIS — D500 Iron deficiency anemia secondary to blood loss (chronic): Secondary | ICD-10-CM | POA: Insufficient documentation

## 2021-12-16 DIAGNOSIS — N182 Chronic kidney disease, stage 2 (mild): Secondary | ICD-10-CM | POA: Diagnosis not present

## 2021-12-16 DIAGNOSIS — I129 Hypertensive chronic kidney disease with stage 1 through stage 4 chronic kidney disease, or unspecified chronic kidney disease: Secondary | ICD-10-CM | POA: Insufficient documentation

## 2021-12-16 DIAGNOSIS — D649 Anemia, unspecified: Secondary | ICD-10-CM

## 2021-12-16 DIAGNOSIS — D509 Iron deficiency anemia, unspecified: Secondary | ICD-10-CM | POA: Insufficient documentation

## 2021-12-16 LAB — CBC WITH DIFFERENTIAL (CANCER CENTER ONLY)
Abs Immature Granulocytes: 0.03 10*3/uL (ref 0.00–0.07)
Basophils Absolute: 0.1 10*3/uL (ref 0.0–0.1)
Basophils Relative: 1 %
Eosinophils Absolute: 0.3 10*3/uL (ref 0.0–0.5)
Eosinophils Relative: 2 %
HCT: 39.6 % (ref 36.0–46.0)
Hemoglobin: 13 g/dL (ref 12.0–15.0)
Immature Granulocytes: 0 %
Lymphocytes Relative: 20 %
Lymphs Abs: 2.2 10*3/uL (ref 0.7–4.0)
MCH: 28.6 pg (ref 26.0–34.0)
MCHC: 32.8 g/dL (ref 30.0–36.0)
MCV: 87 fL (ref 80.0–100.0)
Monocytes Absolute: 0.6 10*3/uL (ref 0.1–1.0)
Monocytes Relative: 5 %
Neutro Abs: 7.9 10*3/uL — ABNORMAL HIGH (ref 1.7–7.7)
Neutrophils Relative %: 72 %
Platelet Count: 433 10*3/uL — ABNORMAL HIGH (ref 150–400)
RBC: 4.55 MIL/uL (ref 3.87–5.11)
RDW: 14.2 % (ref 11.5–15.5)
WBC Count: 11.1 10*3/uL — ABNORMAL HIGH (ref 4.0–10.5)
nRBC: 0 % (ref 0.0–0.2)

## 2021-12-16 LAB — CMP (CANCER CENTER ONLY)
ALT: 13 U/L (ref 0–44)
AST: 13 U/L — ABNORMAL LOW (ref 15–41)
Albumin: 4.2 g/dL (ref 3.5–5.0)
Alkaline Phosphatase: 74 U/L (ref 38–126)
Anion gap: 10 (ref 5–15)
BUN: 15 mg/dL (ref 6–20)
CO2: 26 mmol/L (ref 22–32)
Calcium: 9.6 mg/dL (ref 8.9–10.3)
Chloride: 102 mmol/L (ref 98–111)
Creatinine: 1.37 mg/dL — ABNORMAL HIGH (ref 0.44–1.00)
GFR, Estimated: 51 mL/min — ABNORMAL LOW (ref >60)
Glucose, Bld: 212 mg/dL — ABNORMAL HIGH (ref 70–99)
Potassium: 4.2 mmol/L (ref 3.5–5.1)
Sodium: 138 mmol/L (ref 135–145)
Total Bilirubin: 0.4 mg/dL (ref 0.3–1.2)
Total Protein: 7.3 g/dL (ref 6.5–8.1)

## 2021-12-16 LAB — IRON AND IRON BINDING CAPACITY (CC-WL,HP ONLY)
Iron: 39 ug/dL (ref 28–170)
Saturation Ratios: 10 % — ABNORMAL LOW (ref 10.4–31.8)
TIBC: 395 ug/dL (ref 250–450)
UIBC: 356 ug/dL (ref 148–442)

## 2021-12-16 LAB — RETICULOCYTES
Immature Retic Fract: 16.5 % — ABNORMAL HIGH (ref 2.3–15.9)
RBC.: 4.55 MIL/uL (ref 3.87–5.11)
Retic Count, Absolute: 93.3 10*3/uL (ref 19.0–186.0)
Retic Ct Pct: 2.1 % (ref 0.4–3.1)

## 2021-12-16 LAB — FERRITIN: Ferritin: 11 ng/mL (ref 11–307)

## 2021-12-16 LAB — LACTATE DEHYDROGENASE: LDH: 109 U/L (ref 98–192)

## 2021-12-16 NOTE — Progress Notes (Signed)
Hematology/Oncology Consultation   Name: Selena Meyer      MRN: [id number]    Location: Room/bed info not found  Date: 12/16/2021 Time:9:24 AM   REFERRING PHYSICIAN: Samuel Bouche, NP   REASON FOR CONSULT:  Iron deficiency anemia    DIAGNOSIS: Iron deficiency anemia secondary to heavy cycles   HISTORY OF PRESENT ILLNESS: Selena Meyer is a very pleasant 36 yo African American female with long history of iron deficiency anemia.  She is taking an oral iron supplement daily. She states that she has been on this for one year.  Iron saturation last month was 13% and ferritin 15.  Hgb today is stable at 13.0, MCV 87% and platelets 433.  She states that her cycle is regular lasting up to 10 days at a time. Her flow is heavy and she has noted clots.  No other blood loss noted. No abnormal bruising, no petechiae.  No history of pregnancy or miscarriage.  Her cousin also has history of anemia.  No known sickle cell disease or trait.  She states that she has never had surgery.  She has history of protein in her urine and stage II chronic kidney disease. She is followed by nephrology.  No history of diabetes or thyroid disease.  No fever, chills, n/v, cough, rash, dizziness, SOB, chest pain, palpitations, abdominal pain or changes in bowel or bladder habits.  No swelling, tenderness, numbness or tingling in her extremities.  No falls or syncope.  She quit smoking in May of this year. No recreational drug use. Rare ETOH socially.  Appetite and hydration have been good. Weight is stable at 184 lbs.  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   ROS: All other 10 point review of systems is negative.   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     Hypertension    Irregular menstrual cycle    Irregular periods/menstrual cycles 05/03/2017   Will hold off on Provera or other fertility stuff for now, pt advised pregnancy is not recommended until BP under better control at least, plus other workup pen   Proteinuria 02/28/2013   Uncontrolled hypertension 05/10/2017    ALLERGIES: Allergies  Allergen Reactions   Ibuprofen Hives      MEDICATIONS:  Current Outpatient Medications on File Prior to Visit  Medication Sig Dispense Refill   acetaminophen (TYLENOL) 500 MG tablet Take 1,000 mg by mouth every 6 (six) hours as needed for mild pain.      albuterol (VENTOLIN HFA) 108 (90 Base) MCG/ACT inhaler Inhale 2 puffs into the lungs every 6 (six) hours as needed for wheezing or shortness of breath. 1 each 1   amLODipine (NORVASC) 10 MG tablet Take 1 tablet (10 mg total) by mouth daily. 90 tablet 3   fenofibrate (TRICOR) 145 MG tablet Take 145 mg by mouth daily.     ferrous sulfate 325 (65 FE) MG tablet Take by mouth.     ipratropium (ATROVENT) 0.06 % nasal spray Place 2 sprays into both nostrils 4 (four) times daily. As needed for runny nose / postnasal drip 15 mL 1   magnesium oxide (MAG-OX) 400 MG tablet Take 0.5 tablets by mouth daily.     metoprolol tartrate (  LOPRESSOR) 50 MG tablet Take 50 mg by mouth 2 (two) times daily.     Multiple Vitamins-Minerals (MULTIVITAMIN ADULT) CHEW Chew 2 each by mouth as needed.      Omega-3 Fatty Acids (FISH OIL) 1000 MG CAPS Take by mouth.     No current facility-administered medications on file prior to visit.     PAST SURGICAL HISTORY No past surgical history on file.  FAMILY HISTORY: Family History  Problem Relation Age of Onset   Diabetes Mother    High blood pressure Mother    Renal Disease Mother    Diabetes Sister    Cancer Maternal Grandmother    Diabetes Maternal Grandmother    High blood pressure Maternal Grandmother    Cancer Maternal Aunt        breast   Other Father     SOCIAL  HISTORY:  reports that she quit smoking about 7 months ago. Her smoking use included cigarettes. She has a 1.25 pack-year smoking history. She has never used smokeless tobacco. She reports current alcohol use. She reports that she does not use drugs.  PERFORMANCE STATUS: The patient's performance status is 1 - Symptomatic but completely ambulatory  PHYSICAL EXAM: Most Recent Vital Signs: Blood pressure 132/83, pulse 75, temperature 98.1 F (36.7 C), temperature source Oral, resp. rate 18, height 5\' 5"  (1.651 m), weight 184 lb 1.3 oz (83.5 kg), SpO2 100 %. BP 132/83 (BP Location: Left Arm, Patient Position: Sitting)   Pulse 75   Temp 98.1 F (36.7 C) (Oral)   Resp 18   Ht 5\' 5"  (1.651 m)   Wt 184 lb 1.3 oz (83.5 kg)   SpO2 100%   BMI 30.63 kg/m   General Appearance:    Alert, cooperative, no distress, appears stated age  Head:    Normocephalic, without obvious abnormality, atraumatic  Eyes:    PERRL, conjunctiva/corneas clear, EOM's intact, fundi    benign, both eyes        Throat:   Lips, mucosa, and tongue normal; teeth and gums normal  Neck:   Supple, symmetrical, trachea midline, no adenopathy;    thyroid:  no enlargement/tenderness/nodules; no carotid   bruit or JVD  Back:     Symmetric, no curvature, ROM normal, no CVA tenderness  Lungs:     Clear to auscultation bilaterally, respirations unlabored  Chest Wall:    No tenderness or deformity   Heart:    Regular rate and rhythm, S1 and S2 normal, no murmur, rub   or gallop     Abdomen:     Soft, non-tender, bowel sounds active all four quadrants,    no masses, no organomegaly        Extremities:   Extremities normal, atraumatic, no cyanosis or edema  Pulses:   2+ and symmetric all extremities  Skin:   Skin color, texture, turgor normal, no rashes or lesions  Lymph nodes:   Cervical, supraclavicular, and axillary nodes normal  Neurologic:   CNII-XII intact, normal strength, sensation and reflexes    throughout     LABORATORY DATA:  Results for orders placed or performed in visit on 12/16/21 (from the past 48 hour(s))  Reticulocytes     Status: Abnormal   Collection Time: 12/16/21  8:57 AM  Result Value Ref Range   Retic Ct Pct 2.1 0.4 - 3.1 %   RBC. 4.55 3.87 - 5.11 MIL/uL   Retic Count, Absolute 93.3 19.0 - 186.0 K/uL   Immature Retic Fract 16.5 (H)  2.3 - 15.9 %    Comment: Performed at [facility] at [facility], [address], [place], [place] [address]  CBC with Differential ([facility] Only)     Status: Abnormal   Collection Time: 12/16/21  8:57 AM  Result Value Ref Range   WBC Count 11.1 (H) 4.0 - 10.5 K/uL   RBC 4.55 3.87 - 5.11 MIL/uL   Hemoglobin 13.0 12.0 - 15.0 g/dL   HCT 39.6 36.0 - 46.0 %   MCV 87.0 80.0 - 100.0 fL   MCH 28.6 26.0 - 34.0 pg   MCHC 32.8 30.0 - 36.0 g/dL   RDW 14.2 11.5 - 15.5 %   Platelet Count 433 (H) 150 - 400 K/uL   nRBC 0.0 0.0 - 0.2 %   Neutrophils Relative % 72 %   Neutro Abs 7.9 (H) 1.7 - 7.7 K/uL   Lymphocytes Relative 20 %   Lymphs Abs 2.2 0.7 - 4.0 K/uL   Monocytes Relative 5 %   Monocytes Absolute 0.6 0.1 - 1.0 K/uL   Eosinophils Relative 2 %   Eosinophils Absolute 0.3 0.0 - 0.5 K/uL   Basophils Relative 1 %   Basophils Absolute 0.1 0.0 - 0.1 K/uL   Immature Granulocytes 0 %   Abs Immature Granulocytes 0.03 0.00 - 0.07 K/uL    Comment: Performed at [facility] at [facility], [address] [place], [place] [address]      RADIOGRAPHY: No results found.     PATHOLOGY: None  ASSESSMENT/PLAN: Ms. Likens is a very pleasant 36 yo African American female with long history of iron deficiency anemia.  Iron studies are pending. We will set her up for IV iron if needed.  She may also benefit from daily folic acid.  Follow-up in 8 weeks.   All questions were answered. The patient knows to call the clinic with any problems, questions or concerns. We can certainly see the patient much  sooner if necessary.   Lottie Dawson, NP

## 2021-12-20 LAB — HGB FRACTIONATION CASCADE
Hgb A2: 2.4 % (ref 1.8–3.2)
Hgb A: 97.6 % (ref 96.4–98.8)
Hgb F: 0 % (ref 0.0–2.0)
Hgb S: 0 %

## 2021-12-21 LAB — ERYTHROPOIETIN: Erythropoietin: 27.2 m[IU]/mL — ABNORMAL HIGH (ref 2.6–18.5)

## 2021-12-26 ENCOUNTER — Inpatient Hospital Stay: Payer: BC Managed Care – PPO

## 2021-12-26 VITALS — BP 156/89 | HR 70 | Temp 98.0°F | Resp 18

## 2021-12-26 DIAGNOSIS — I129 Hypertensive chronic kidney disease with stage 1 through stage 4 chronic kidney disease, or unspecified chronic kidney disease: Secondary | ICD-10-CM | POA: Diagnosis not present

## 2021-12-26 DIAGNOSIS — Z87891 Personal history of nicotine dependence: Secondary | ICD-10-CM | POA: Diagnosis not present

## 2021-12-26 DIAGNOSIS — N920 Excessive and frequent menstruation with regular cycle: Secondary | ICD-10-CM | POA: Diagnosis not present

## 2021-12-26 DIAGNOSIS — D500 Iron deficiency anemia secondary to blood loss (chronic): Secondary | ICD-10-CM | POA: Diagnosis not present

## 2021-12-26 DIAGNOSIS — N182 Chronic kidney disease, stage 2 (mild): Secondary | ICD-10-CM | POA: Diagnosis not present

## 2021-12-26 MED ORDER — SODIUM CHLORIDE 0.9 % IV SOLN
510.0000 mg | Freq: Once | INTRAVENOUS | Status: AC
  Administered 2021-12-26: 510 mg via INTRAVENOUS
  Filled 2021-12-26: qty 510

## 2021-12-26 MED ORDER — SODIUM CHLORIDE 0.9 % IV SOLN: Freq: Once | INTRAVENOUS | Status: AC

## 2021-12-28 LAB — ALPHA-THALASSEMIA GENOTYPR

## 2022-01-03 ENCOUNTER — Inpatient Hospital Stay: Payer: BC Managed Care – PPO

## 2022-01-03 VITALS — BP 145/84 | HR 68 | Temp 98.1°F | Resp 18

## 2022-01-03 DIAGNOSIS — N920 Excessive and frequent menstruation with regular cycle: Secondary | ICD-10-CM | POA: Diagnosis not present

## 2022-01-03 DIAGNOSIS — N182 Chronic kidney disease, stage 2 (mild): Secondary | ICD-10-CM | POA: Diagnosis not present

## 2022-01-03 DIAGNOSIS — D500 Iron deficiency anemia secondary to blood loss (chronic): Secondary | ICD-10-CM | POA: Diagnosis not present

## 2022-01-03 DIAGNOSIS — Z87891 Personal history of nicotine dependence: Secondary | ICD-10-CM | POA: Diagnosis not present

## 2022-01-03 DIAGNOSIS — I129 Hypertensive chronic kidney disease with stage 1 through stage 4 chronic kidney disease, or unspecified chronic kidney disease: Secondary | ICD-10-CM | POA: Diagnosis not present

## 2022-01-03 MED ORDER — SODIUM CHLORIDE 0.9 % IV SOLN: Freq: Once | INTRAVENOUS | Status: AC

## 2022-01-03 MED ORDER — SODIUM CHLORIDE 0.9 % IV SOLN
510.0000 mg | Freq: Once | INTRAVENOUS | Status: AC
  Administered 2022-01-03: 510 mg via INTRAVENOUS
  Filled 2022-01-03: qty 17

## 2022-01-03 NOTE — Patient Instructions (Signed)

## 2022-01-09 DIAGNOSIS — Z419 Encounter for procedure for purposes other than remedying health state, unspecified: Secondary | ICD-10-CM | POA: Diagnosis not present

## 2022-01-09 NOTE — L&D Delivery Note (Signed)
OB/GYN Faculty Practice Delivery Note  Selena Meyer is a 37 y.o. G1P0 s/p induced spontaneous vaginal delivery at [redacted]w[redacted]d. She was admitted for IOL in the s/o  preeclampsia with severe features .  ROM: 11h 40m with clear fluid GBS Status: negative Maximum Maternal Temperature: 99 F  Today's Vitals   09/29/22 1112 09/29/22 1117 09/29/22 1132 09/29/22 1202  BP: (!) 163/99 (!) 154/89 (!) 147/87 (!) 156/94  Pulse: 82 73 74 79  Resp: 16 18  16   Temp:    98.8 F (37.1 C)  TempSrc:    Oral  SpO2:      Weight:      Height:      PainSc:  0-No pain      Labor Progress: Patient progressed to induced vaginal delivery. Augmentation with cytotec, AROM, pitocin.  Delivery Date/Time: 09/29/2022 1320 Delivery: Called to room and patient was complete and pushing. Presenting part was hand, which was reduced. Head delivered LOA. No nuchal cord present. Shoulder and body delivered in usual fashion. Infant with spontaneous cry, placed on mother's abdomen, dried and stimulated. Cord clamped x 2 after 1-minute delay, and cut by FOB under my direct supervision. Cord blood drawn. Placenta delivered spontaneously with gentle cord traction. Copious bleeding was noted after delivery of the placenta, uterine atony of the lower uterine segment was palpated, and a uterine sweep was performed. Pt was administered pitocin, TXA, and methergine with cessation of bleeding. Fundus was firm following intervention. Labia, perineum, vagina, and cervix were inspected, one first degree vaginal laceration noted and repaired.   Placenta: Intact, 3 vessel cord Complications: none Lacerations: first degree vaginal  EBL: 435 mL Analgesia: epidural   Postpartum Planning  Infant: viable female  APGARs 6, 8  TBD g  Angus Palms MD, PGY1

## 2022-02-09 DIAGNOSIS — Z419 Encounter for procedure for purposes other than remedying health state, unspecified: Secondary | ICD-10-CM | POA: Diagnosis not present

## 2022-02-16 ENCOUNTER — Encounter: Payer: Self-pay | Admitting: Family

## 2022-02-20 ENCOUNTER — Ambulatory Visit: Payer: BC Managed Care – PPO | Admitting: Family

## 2022-02-20 ENCOUNTER — Inpatient Hospital Stay: Payer: BC Managed Care – PPO

## 2022-02-21 ENCOUNTER — Inpatient Hospital Stay: Payer: BC Managed Care – PPO | Attending: Family

## 2022-02-21 ENCOUNTER — Encounter: Payer: Self-pay | Admitting: Family

## 2022-02-21 ENCOUNTER — Inpatient Hospital Stay (HOSPITAL_BASED_OUTPATIENT_CLINIC_OR_DEPARTMENT_OTHER): Payer: BC Managed Care – PPO | Admitting: Family

## 2022-02-21 VITALS — BP 147/85 | HR 70 | Temp 98.7°F | Resp 18 | Wt 182.0 lb

## 2022-02-21 DIAGNOSIS — N920 Excessive and frequent menstruation with regular cycle: Secondary | ICD-10-CM | POA: Diagnosis not present

## 2022-02-21 DIAGNOSIS — D500 Iron deficiency anemia secondary to blood loss (chronic): Secondary | ICD-10-CM | POA: Diagnosis not present

## 2022-02-21 LAB — CBC WITH DIFFERENTIAL (CANCER CENTER ONLY)
Abs Immature Granulocytes: 0.19 10*3/uL — ABNORMAL HIGH (ref 0.00–0.07)
Basophils Absolute: 0.1 10*3/uL (ref 0.0–0.1)
Basophils Relative: 1 %
Eosinophils Absolute: 0.3 10*3/uL (ref 0.0–0.5)
Eosinophils Relative: 3 %
HCT: 44.6 % (ref 36.0–46.0)
Hemoglobin: 15 g/dL (ref 12.0–15.0)
Immature Granulocytes: 2 %
Lymphocytes Relative: 19 %
Lymphs Abs: 2.5 10*3/uL (ref 0.7–4.0)
MCH: 30.1 pg (ref 26.0–34.0)
MCHC: 33.6 g/dL (ref 30.0–36.0)
MCV: 89.6 fL (ref 80.0–100.0)
Monocytes Absolute: 0.7 10*3/uL (ref 0.1–1.0)
Monocytes Relative: 6 %
Neutro Abs: 9.2 10*3/uL — ABNORMAL HIGH (ref 1.7–7.7)
Neutrophils Relative %: 69 %
Platelet Count: 385 10*3/uL (ref 150–400)
RBC: 4.98 MIL/uL (ref 3.87–5.11)
RDW: 14.5 % (ref 11.5–15.5)
WBC Count: 13 10*3/uL — ABNORMAL HIGH (ref 4.0–10.5)
nRBC: 0 % (ref 0.0–0.2)

## 2022-02-21 LAB — RETICULOCYTES
Immature Retic Fract: 20.1 % — ABNORMAL HIGH (ref 2.3–15.9)
RBC.: 4.94 MIL/uL (ref 3.87–5.11)
Retic Count, Absolute: 89.9 10*3/uL (ref 19.0–186.0)
Retic Ct Pct: 1.8 % (ref 0.4–3.1)

## 2022-02-21 LAB — IRON AND IRON BINDING CAPACITY (CC-WL,HP ONLY)
Iron: 68 ug/dL (ref 28–170)
Saturation Ratios: 22 % (ref 10.4–31.8)
TIBC: 307 ug/dL (ref 250–450)
UIBC: 239 ug/dL (ref 148–442)

## 2022-02-21 LAB — FERRITIN: Ferritin: 45 ng/mL (ref 11–307)

## 2022-02-21 NOTE — Progress Notes (Signed)
Hematology and Oncology Follow Up Visit  Selena Meyer [id number] 13-Nov-1985 37 y.o. 02/21/2022   Principle Diagnosis:  Iron deficiency anemia secondary to heavy cycles    Current Therapy:   IV iron as indicated    Interim History:  Selena Meyer is here today for follow-up. She is doing well but still notes some fatigue. She state that her cycle is now 7 days late and she will be stopping to get a pregnancy test.  Her cycle last month was a little heavier with some spotting ing between.  No other blood loss noted. No bruising or petechiae.  No fever, chills, n/v, cough, rash, dizziness, SOB, chest pain, palpitations, abdominal pain or changes in bowel or bladder habits.  No swelling, tenderness, numbness or tingling in her extremities.  No falls or syncope reported.  Appetite and hydration are good. Weight is stable at 182 lbs.   ECOG Performance Status: 1 - Symptomatic but completely ambulatory  Medications:  Allergies as of 02/21/2022       Reactions   Ibuprofen Hives        Medication List        Accurate as of February 21, 2022  9:06 AM. If you have any questions, ask your nurse or doctor.          acetaminophen 500 MG tablet Commonly known as: TYLENOL Take 1,000 mg by mouth every 6 (six) hours as needed for mild pain.   albuterol 108 (90 Base) MCG/ACT inhaler Commonly known as: VENTOLIN HFA Inhale 2 puffs into the lungs every 6 (six) hours as needed for wheezing or shortness of breath.   amLODipine 10 MG tablet Commonly known as: NORVASC Take 1 tablet (10 mg total) by mouth daily.   fenofibrate 145 MG tablet Commonly known as: TRICOR Take 145 mg by mouth daily.   ferrous sulfate 325 (65 FE) MG tablet Take by mouth.   Fish Oil 1000 MG Caps Take by mouth.   ipratropium 0.06 % nasal spray Commonly known as: ATROVENT Place 2 sprays into both nostrils 4 (four) times daily. As needed for runny nose / postnasal drip   magnesium oxide 400 MG  tablet Commonly known as: MAG-OX Take 0.5 tablets by mouth daily.   metoprolol tartrate 50 MG tablet Commonly known as: LOPRESSOR Take 50 mg by mouth 2 (two) times daily.   Multivitamin Adult Chew Chew 2 each by mouth as needed.        Allergies:  Allergies  Allergen Reactions   Ibuprofen Hives    Past Medical History, Surgical history, Social history, and Family History were reviewed and updated.  Review of Systems: All other 10 point review of systems is negative.   Physical Exam:  weight is 182 lb 0.6 oz (82.6 kg). Her oral temperature is 98.7 F (37.1 C). Her blood pressure is 173/106 (abnormal) and her pulse is 70. Her respiration is 18 and oxygen saturation is 100%.   Wt Readings from Last 3 Encounters:  02/21/22 182 lb 0.6 oz (82.6 kg)  12/16/21 184 lb 1.3 oz (83.5 kg)  11/15/21 184 lb (83.5 kg)    Ocular: Sclerae unicteric, pupils equal, round and reactive to light Ear-nose-throat: Oropharynx clear, dentition fair Lymphatic: No cervical or supraclavicular adenopathy Lungs no rales or rhonchi, good excursion bilaterally Heart regular rate and rhythm, no murmur appreciated Abd soft, nontender, positive bowel sounds MSK no focal spinal tenderness, no joint edema Neuro: non-focal, well-oriented, appropriate affect Breasts: Deferred   Lab Results  Component  Value Date   WBC 13.0 (H) 02/21/2022   HGB 15.0 02/21/2022   HCT 44.6 02/21/2022   MCV 89.6 02/21/2022   PLT 385 02/21/2022   Lab Results  Component Value Date   FERRITIN 11 12/16/2021   IRON 39 12/16/2021   TIBC 395 12/16/2021   UIBC 356 12/16/2021   IRONPCTSAT 10 (L) 12/16/2021   Lab Results  Component Value Date   RETICCTPCT 1.8 02/21/2022   RBC 4.94 02/21/2022   RBC 4.98 02/21/2022   RETICCTABS 63,000 11/08/2020   No results found for: "KPAFRELGTCHN", "LAMBDASER", "KAPLAMBRATIO" No results found for: "IGGSERUM", "IGA", "IGMSERUM" No results found for: "TOTALPROTELP", "ALBUMINELP",  "A1GS", "A2GS", "BETS", "BETA2SER", "GAMS", "MSPIKE", "SPEI"   Chemistry      Component Value Date/Time   NA 138 12/16/2021 0857   K 4.2 12/16/2021 0857   CL 102 12/16/2021 0857   CO2 26 12/16/2021 0857   BUN 15 12/16/2021 0857   BUN 15 10/05/2016 0000   CREATININE 1.37 (H) 12/16/2021 0857   CREATININE 1.34 (H) 11/15/2021 0000      Component Value Date/Time   CALCIUM 9.6 12/16/2021 0857   ALKPHOS 74 12/16/2021 0857   AST 13 (L) 12/16/2021 0857   ALT 13 12/16/2021 0857   BILITOT 0.4 12/16/2021 0857       Impression and Plan:  Selena Meyer is a very pleasant 37 yo African American female with long history of iron deficiency anemia.  Iron studies are pending.  Follow-up in 4 months.   Lottie Dawson, NP 2/13/20249:06 AM

## 2022-02-22 DIAGNOSIS — Z3201 Encounter for pregnancy test, result positive: Secondary | ICD-10-CM | POA: Diagnosis not present

## 2022-02-23 ENCOUNTER — Telehealth: Payer: Self-pay | Admitting: *Deleted

## 2022-02-23 NOTE — Telephone Encounter (Signed)
Call received from patient to inform Selena Haven NP that she confirmed at [facility] yesterday that she is pregnant. Selena Haven NP notified.

## 2022-03-08 ENCOUNTER — Other Ambulatory Visit: Payer: Self-pay | Admitting: Pharmacist

## 2022-03-08 NOTE — Progress Notes (Signed)
Patient appearing on report for [employer] [facility] - Hypertension Control report due to last documented ambulatory blood pressure of 145/84 on 01/03/22. Next appointment with PCP is 05/16/22   Outreached patient to discuss hypertension control and medication management.   Of note, patient disclosed she is newly pregnant.  Current antihypertensives: amlodipine '10mg'$ , metoprolol '50mg'$  BID  Patient has an automated upper arm home BP machine.  Current blood pressure readings: 0000000 systolic, it varies   Patient denies hypotensive signs and symptoms including dizziness, lightheadedness.  Patient denies hypertensive symptoms including headache, chest pain, shortness of breath.    Assessment/Plan: - Currently unknown control, advised patient to check home blood pressure readings and document for upcoming visits - - Reviewed appropriate administration of medication regimen - Reviewed appropriate home BP monitoring technique (avoid caffeine, smoking, and exercise for 30 minutes before checking, rest for at least 5 minutes before taking BP, sit with feet flat on the floor and back against a hard surface, uncross legs, and rest arm on flat surface) - Discussed dietary modifications, such as reduced salt intake, focus on whole grains, vegetables, lean proteins - Recommend continue current medications, review with OB at upcoming visit  Larinda Buttery, PharmD Clinical Pharmacist [place] [facility] [phone]

## 2022-03-08 NOTE — Patient Instructions (Signed)
Selena Meyer,  It was great speaking with you today. As discussed, the number to call for resources to help quit smoking is 1-800-QUITNOW.   Also, be sure to check blood pressure and document some numbers for your doctors to review at upcoming visits.  Below are some helpful tips regarding checking blood pressure:   Check your blood pressure twice weekly, and any time you have concerning symptoms like headache, chest pain, dizziness, shortness of breath, or vision changes.   Our goal is less than 140/90.  To appropriately check your blood pressure, make sure you do the following:  1) Avoid caffeine, exercise, or tobacco products for 30 minutes before checking. Empty your bladder. 2) Sit with your back supported in a flat-backed chair. Rest your arm on something flat (arm of the chair, table, etc). 3) Sit still with your feet flat on the floor, resting, for at least 5 minutes.  4) Check your blood pressure. Take 1-2 readings.  5) Write down these readings and bring with you to any provider appointments.  Bring your home blood pressure machine with you to a provider's office for accuracy comparison at least once a year.   Make sure you take your blood pressure medications before you come to any office visit, even if you were asked to fast for labs.   Take care, Selena Meyer Shu, PharmD Clinical Pharmacist [facility] [phone]

## 2022-03-10 DIAGNOSIS — Z419 Encounter for procedure for purposes other than remedying health state, unspecified: Secondary | ICD-10-CM | POA: Diagnosis not present

## 2022-03-30 ENCOUNTER — Encounter: Payer: Self-pay | Admitting: *Deleted

## 2022-03-30 DIAGNOSIS — O0990 Supervision of high risk pregnancy, unspecified, unspecified trimester: Secondary | ICD-10-CM | POA: Insufficient documentation

## 2022-03-30 NOTE — Progress Notes (Addendum)
Pt had spotting about two weeks ago Pt would like genetic testing

## 2022-04-03 ENCOUNTER — Encounter: Payer: Self-pay | Admitting: Obstetrics & Gynecology

## 2022-04-03 ENCOUNTER — Ambulatory Visit (INDEPENDENT_AMBULATORY_CARE_PROVIDER_SITE_OTHER): Payer: BC Managed Care – PPO | Admitting: Obstetrics & Gynecology

## 2022-04-03 ENCOUNTER — Other Ambulatory Visit (INDEPENDENT_AMBULATORY_CARE_PROVIDER_SITE_OTHER): Payer: BC Managed Care – PPO

## 2022-04-03 ENCOUNTER — Other Ambulatory Visit (HOSPITAL_COMMUNITY)
Admission: RE | Admit: 2022-04-03 | Discharge: 2022-04-03 | Disposition: A | Discharge status: Home / Self Care | Payer: BC Managed Care – PPO | Source: Ambulatory Visit | Attending: Obstetrics & Gynecology | Admitting: Obstetrics & Gynecology

## 2022-04-03 ENCOUNTER — Encounter: Payer: Self-pay | Admitting: Family

## 2022-04-03 VITALS — BP 152/97 | HR 67 | Wt 188.0 lb

## 2022-04-03 DIAGNOSIS — N182 Chronic kidney disease, stage 2 (mild): Secondary | ICD-10-CM

## 2022-04-03 DIAGNOSIS — O139 Gestational [pregnancy-induced] hypertension without significant proteinuria, unspecified trimester: Secondary | ICD-10-CM

## 2022-04-03 DIAGNOSIS — O0990 Supervision of high risk pregnancy, unspecified, unspecified trimester: Secondary | ICD-10-CM | POA: Diagnosis not present

## 2022-04-03 DIAGNOSIS — Z3A10 10 weeks gestation of pregnancy: Secondary | ICD-10-CM | POA: Diagnosis not present

## 2022-04-03 DIAGNOSIS — Z3401 Encounter for supervision of normal first pregnancy, first trimester: Secondary | ICD-10-CM | POA: Diagnosis not present

## 2022-04-03 DIAGNOSIS — O131 Gestational [pregnancy-induced] hypertension without significant proteinuria, first trimester: Secondary | ICD-10-CM | POA: Diagnosis not present

## 2022-04-03 DIAGNOSIS — Z3A00 Weeks of gestation of pregnancy not specified: Secondary | ICD-10-CM | POA: Insufficient documentation

## 2022-04-03 MED ORDER — ONDANSETRON HCL 4 MG PO TABS: 4.0000 mg | ORAL_TABLET | Freq: Three times a day (TID) | ORAL | 0 refills | Status: DC | PRN

## 2022-04-03 NOTE — Progress Notes (Signed)
Subjective:    Selena Meyer is a G1P0 [redacted]w[redacted]d being seen today for her first obstetrical visit.  Her obstetrical history is significant for  uncontrolled HTN, CKD (FSGS), BMI=30,  . Patient does intend to breast feed. Pregnancy history fully reviewed.  Patient reports nausea and vomiting.  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    Hypertension    Irregular menstrual cycle    Irregular periods/menstrual cycles 05/03/2017   Will hold off on Provera or other fertility stuff for now, pt advised pregnancy is not recommended until BP under better control at least, plus other workup pen   Proteinuria 02/28/2013   Uncontrolled hypertension 05/10/2017   History reviewed. No pertinent surgical history. Family History  Problem Relation Age of Onset   Diabetes Mother    High blood pressure Mother    Renal Disease Mother    Diabetes Sister    Cancer Maternal Grandmother    Diabetes Maternal Grandmother    High blood pressure Maternal Grandmother    Cancer Maternal Aunt        breast   Other Father      Exam    Uterus:     Pelvic Exam:    Perineum: No Hemorrhoids   Vulva: normal   Vagina:  normal mucosa, normal discharge   pH: N/a   Cervix: no lesions   Adnexa: normal adnexa   Bony Pelvis: average  System: Breast:  normal appearance, no masses or tenderness   Skin: normal coloration and turgor, no rashes    Neurologic: oriented, normal mood   Extremities: no deformities   HEENT sclera clear, anicteric,  oropharynx clear, no lesions, neck supple with midline trachea, thyroid without masses, and trachea midline   Mouth/Teeth mucous membranes moist, pharynx normal without lesions   Neck supple and no masses   Cardiovascular: regular rate and rhythm   Respiratory:  appears well, vitals normal, no respiratory distress, acyanotic, normal RR, chest clear, no wheezing, crepitations, rhonchi, normal symmetric air entry   Abdomen: soft, non-tender; bowel sounds normal; no masses,  no organomegaly   Urinary: urethral meatus normal      Assessment:    Pregnancy: G1P0 Patient Active Problem List   Diagnosis Date Noted   Supervision of high risk pregnancy, antepartum 03/30/2022   IDA (iron deficiency anemia) 12/16/2021   SOB (shortness of breath) 11/05/2020   Hypokalemia 11/02/2020   SVT (supraventricular tachycardia) 11/01/2020   Stage 2 chronic kidney disease 04/27/2020   Elevated cholesterol with high triglycerides 04/27/2020   Elevated hemoglobin A1c measurement 04/27/2020   Anemia 04/27/2020   Hypertension secondary to other renal disorders 04/27/2020   Lumbar radiculopathy 09/22/2019   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 Prenatal vitamins. Problem list reviewed and updated. Genetic Screening discussed  IPS & AFP  Ultrasound discussed; fetal survey: ordered.  Meds:  Stop Tricor Controlled HTN:  pt states it is usually better.  She took her Lopressor about 1 hour ago.  Pt to take BP later tday and once a day (mid day) and send to [place] via my  chart.  If elevated, can increase lopressor. Baby ASA at 13 weeks Early GTT to screen for diabetes Nausea & vomiting:  Zofran prn Baby Rx APP only Refer to MFM for co-management.   Silas Sacramento 04/03/2022

## 2022-04-04 DIAGNOSIS — O0990 Supervision of high risk pregnancy, unspecified, unspecified trimester: Secondary | ICD-10-CM | POA: Diagnosis not present

## 2022-04-04 LAB — COMPREHENSIVE METABOLIC PANEL
ALT: 15 IU/L (ref 0–32)
AST: 14 IU/L (ref 0–40)
Albumin/Globulin Ratio: 1.2 (ref 1.2–2.2)
Albumin: 3.8 g/dL — ABNORMAL LOW (ref 3.9–4.9)
Alkaline Phosphatase: 67 IU/L (ref 44–121)
BUN/Creatinine Ratio: 9 (ref 9–23)
BUN: 9 mg/dL (ref 6–20)
Bilirubin Total: 0.2 mg/dL (ref 0.0–1.2)
CO2: 22 mmol/L (ref 20–29)
Calcium: 9.7 mg/dL (ref 8.7–10.2)
Chloride: 99 mmol/L (ref 96–106)
Creatinine, Ser: 0.98 mg/dL (ref 0.57–1.00)
Globulin, Total: 3.1 g/dL (ref 1.5–4.5)
Glucose: 150 mg/dL — ABNORMAL HIGH (ref 70–99)
Potassium: 4.1 mmol/L (ref 3.5–5.2)
Sodium: 136 mmol/L (ref 134–144)
Total Protein: 6.9 g/dL (ref 6.0–8.5)
eGFR: 76 mL/min/{1.73_m2} (ref >59)

## 2022-04-04 LAB — PROTEIN / CREATININE RATIO, URINE
Creatinine, Urine: 176.5 mg/dL
Protein, Ur: 226.9 mg/dL
Protein/Creat Ratio: 1286 mg/g creat — ABNORMAL HIGH (ref 0–200)

## 2022-04-05 LAB — PREGNANCY, INITIAL SCREEN
Antibody Screen: NEGATIVE
Basophils Absolute: 0.1 10*3/uL (ref 0.0–0.2)
Basos: 1 %
Bilirubin, UA: NEGATIVE
Chlamydia trachomatis, NAA: NEGATIVE
EOS (ABSOLUTE): 0.3 10*3/uL (ref 0.0–0.4)
Eos: 2 %
Glucose, UA: NEGATIVE
HCV Ab: NONREACTIVE
HIV Screen 4th Generation wRfx: NONREACTIVE
Hematocrit: 44.6 % (ref 34.0–46.6)
Hemoglobin: 14.8 g/dL (ref 11.1–15.9)
Hepatitis B Surface Ag: NEGATIVE
Immature Grans (Abs): 0 10*3/uL (ref 0.0–0.1)
Immature Granulocytes: 0 %
Ketones, UA: NEGATIVE
Leukocytes,UA: NEGATIVE
Lymphocytes Absolute: 2.4 10*3/uL (ref 0.7–3.1)
Lymphs: 17 %
MCH: 31 pg (ref 26.6–33.0)
MCHC: 33.2 g/dL (ref 31.5–35.7)
MCV: 93 fL (ref 79–97)
Monocytes Absolute: 0.7 10*3/uL (ref 0.1–0.9)
Monocytes: 5 %
Neisseria Gonorrhoeae by PCR: NEGATIVE
Neutrophils Absolute: 10.5 10*3/uL — ABNORMAL HIGH (ref 1.4–7.0)
Neutrophils: 75 %
Nitrite, UA: NEGATIVE
Platelets: 343 10*3/uL (ref 150–450)
RBC: 4.78 x10E6/uL (ref 3.77–5.28)
RDW: 13.1 % (ref 11.7–15.4)
RPR Ser Ql: NONREACTIVE
Rh Factor: POSITIVE
Rubella Antibodies, IGG: 2.03 index (ref >0.99)
Specific Gravity, UA: 1.02 (ref 1.005–1.030)
Urobilinogen, Ur: 0.2 mg/dL (ref 0.2–1.0)
WBC: 14 10*3/uL — ABNORMAL HIGH (ref 3.4–10.8)
pH, UA: 6 (ref 5.0–7.5)

## 2022-04-05 LAB — MICROSCOPIC EXAMINATION
Casts: NONE SEEN /lpf
Epithelial Cells (non renal): >10 /hpf — AB (ref 0–10)

## 2022-04-05 LAB — URINE CULTURE, OB REFLEX

## 2022-04-05 LAB — GLUCOSE TOLERANCE, 1 HOUR: Glucose, 1Hr PP: 272 mg/dL — ABNORMAL HIGH (ref 70–199)

## 2022-04-05 LAB — HCV INTERPRETATION

## 2022-04-06 ENCOUNTER — Other Ambulatory Visit: Payer: Self-pay

## 2022-04-06 DIAGNOSIS — O24419 Gestational diabetes mellitus in pregnancy, unspecified control: Secondary | ICD-10-CM

## 2022-04-07 LAB — CYTOLOGY - PAP
Comment: NEGATIVE
Comment: NEGATIVE
Diagnosis: NEGATIVE
High risk HPV: NEGATIVE
Trichomonas: NEGATIVE

## 2022-04-10 ENCOUNTER — Encounter: Payer: Self-pay | Admitting: Obstetrics & Gynecology

## 2022-04-10 ENCOUNTER — Other Ambulatory Visit: Payer: Self-pay | Admitting: *Deleted

## 2022-04-10 DIAGNOSIS — Z419 Encounter for procedure for purposes other than remedying health state, unspecified: Secondary | ICD-10-CM | POA: Diagnosis not present

## 2022-04-10 DIAGNOSIS — O24419 Gestational diabetes mellitus in pregnancy, unspecified control: Secondary | ICD-10-CM

## 2022-04-12 ENCOUNTER — Encounter: Payer: Self-pay | Admitting: Obstetrics & Gynecology

## 2022-04-22 ENCOUNTER — Encounter: Payer: Self-pay | Admitting: Obstetrics & Gynecology

## 2022-04-26 ENCOUNTER — Other Ambulatory Visit (INDEPENDENT_AMBULATORY_CARE_PROVIDER_SITE_OTHER): Payer: BC Managed Care – PPO

## 2022-04-26 DIAGNOSIS — R399 Unspecified symptoms and signs involving the genitourinary system: Secondary | ICD-10-CM | POA: Diagnosis not present

## 2022-04-26 NOTE — Progress Notes (Signed)
Spoke with pt on Monday and she had UTI symptoms over the weekend but they had gotten better. Pt offered to come on Monday for urine culture but, couldn't come until today. Pt sent to lab for culture.

## 2022-04-27 ENCOUNTER — Ambulatory Visit (INDEPENDENT_AMBULATORY_CARE_PROVIDER_SITE_OTHER): Payer: BC Managed Care – PPO | Admitting: Registered"

## 2022-04-27 ENCOUNTER — Encounter: Payer: Self-pay | Admitting: Registered"

## 2022-04-27 DIAGNOSIS — Z3401 Encounter for supervision of normal first pregnancy, first trimester: Secondary | ICD-10-CM | POA: Diagnosis not present

## 2022-04-27 DIAGNOSIS — O24419 Gestational diabetes mellitus in pregnancy, unspecified control: Secondary | ICD-10-CM | POA: Insufficient documentation

## 2022-04-27 DIAGNOSIS — Z3481 Encounter for supervision of other normal pregnancy, first trimester: Secondary | ICD-10-CM | POA: Diagnosis not present

## 2022-04-27 DIAGNOSIS — Z3A14 14 weeks gestation of pregnancy: Secondary | ICD-10-CM

## 2022-04-27 DIAGNOSIS — Z8632 Personal history of gestational diabetes: Secondary | ICD-10-CM | POA: Insufficient documentation

## 2022-04-27 NOTE — Progress Notes (Signed)
Patient was seen for Gestational Diabetes self-management on 04/27/22  Start time 1017 and End time 1115   Estimated due date: 10/25/22; [redacted]w[redacted]d  Next ob visit 05/02/22 at [facility]  Clinical: Medications: reviewed Medical History: PCOS, Stage 2 CKD, HTN, anemia Labs:  No results found for: "HGBA1C"   Dietary and Lifestyle History: Pt reports she has seem family members check blood sugar but doesn't know much about diabetes.   Pt states she was taking metformin for a few years for blood pressure. Pt reports once she changed doctors she was taken off metformin.  Pt states yesterday she felt sick after eating about 2 1/2 c fresh pineapple and threw up. A few hours felt dizzy.  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   Pt states prior to pregnancy she would have BM 2-3 x/day. Pt states now can vary from daily to 1-2x/week. Pt states has not noticed a pattern with what she eats, but may be a pattern depending on if she is on the road or home.   Physical Activity: not assessed Stress: not assessed Sleep: not assessed  24 hr Recall: not a full recall First Meal: steak, egg and cheese biscuit, hash brown, ~4-6 oz sweet tea. Snack: Second meal: Snack: Third meal: 6 pm [place] [employer] 1 and half small sandwich on whole wheat, regular Sprite Snack: Beverages: Sweet tea, sprite, water  NUTRITION INTERVENTION  Nutrition education (E-1) on the following topics:   Initial Follow-up    Definition of Gestational Diabetes   Why dietary management is important in controlling blood glucose   Effects each nutrient has on blood glucose levels   Simple carbohydrates vs complex carbohydrates   Fluid intake   Creating a balanced meal plan   Carbohydrate counting    When to check blood glucose levels   Proper blood glucose monitoring techniques   Effect of stress and  stress reduction techniques    Exercise effect on blood glucose levels, appropriate exercise during pregnancy   Importance of limiting caffeine and abstaining from alcohol and smoking   Medications used for blood sugar control during pregnancy   Hypoglycemia and rule of 15   Postpartum self care  Blood glucose monitor given: Accu-chek Guide Me Lot #[id number] Exp: 2022-11-21 CBG: 150 mg/dL   Patient instructed to monitor glucose levels: FBS: 60 - ? 95 mg/dL; 2 hour: ? 045 mg/dL  Patient received handouts: Nutrition Diabetes and Pregnancy Carbohydrate Counting List  Patient will be seen for follow-up as needed.

## 2022-04-28 ENCOUNTER — Other Ambulatory Visit: Payer: Self-pay

## 2022-04-28 DIAGNOSIS — O0990 Supervision of high risk pregnancy, unspecified, unspecified trimester: Secondary | ICD-10-CM

## 2022-04-28 MED ORDER — GLUCOSE BLOOD VI STRP: ORAL_STRIP | 12 refills | Status: DC

## 2022-04-28 MED ORDER — ACCU-CHEK SOFTCLIX LANCETS MISC: 100.0000 | Freq: Four times a day (QID) | 12 refills | Status: DC

## 2022-04-30 LAB — URINE CULTURE

## 2022-05-01 ENCOUNTER — Other Ambulatory Visit: Payer: Self-pay

## 2022-05-01 ENCOUNTER — Encounter: Payer: Self-pay | Admitting: Obstetrics & Gynecology

## 2022-05-01 DIAGNOSIS — O219 Vomiting of pregnancy, unspecified: Secondary | ICD-10-CM

## 2022-05-01 MED ORDER — ONDANSETRON HCL 8 MG PO TABS: 8.0000 mg | ORAL_TABLET | Freq: Three times a day (TID) | ORAL | 0 refills | Status: DC | PRN

## 2022-05-01 NOTE — Progress Notes (Signed)
Rx for Zofran  sent per Dr.Leggett

## 2022-05-04 ENCOUNTER — Ambulatory Visit (INDEPENDENT_AMBULATORY_CARE_PROVIDER_SITE_OTHER): Payer: BC Managed Care – PPO | Admitting: Obstetrics and Gynecology

## 2022-05-04 VITALS — BP 146/85 | HR 87 | Wt 192.0 lb

## 2022-05-04 DIAGNOSIS — N051 Unspecified nephritic syndrome with focal and segmental glomerular lesions: Secondary | ICD-10-CM

## 2022-05-04 DIAGNOSIS — O0990 Supervision of high risk pregnancy, unspecified, unspecified trimester: Secondary | ICD-10-CM

## 2022-05-04 DIAGNOSIS — N182 Chronic kidney disease, stage 2 (mild): Secondary | ICD-10-CM

## 2022-05-04 DIAGNOSIS — I151 Hypertension secondary to other renal disorders: Secondary | ICD-10-CM

## 2022-05-04 DIAGNOSIS — Z3A15 15 weeks gestation of pregnancy: Secondary | ICD-10-CM

## 2022-05-04 DIAGNOSIS — I471 Supraventricular tachycardia, unspecified: Secondary | ICD-10-CM

## 2022-05-04 DIAGNOSIS — O24415 Gestational diabetes mellitus in pregnancy, controlled by oral hypoglycemic drugs: Secondary | ICD-10-CM

## 2022-05-04 LAB — PANORAMA PRENATAL TEST FULL PANEL:PANORAMA TEST PLUS 5 ADDITIONAL MICRODELETIONS: FETAL FRACTION: 5.3

## 2022-05-04 LAB — HORIZON CUSTOM: REPORT SUMMARY: NEGATIVE

## 2022-05-04 MED ORDER — METFORMIN HCL 500 MG PO TABS: 500.0000 mg | ORAL_TABLET | Freq: Two times a day (BID) | ORAL | 5 refills | Status: DC

## 2022-05-05 DIAGNOSIS — O09519 Supervision of elderly primigravida, unspecified trimester: Secondary | ICD-10-CM | POA: Insufficient documentation

## 2022-05-05 NOTE — Progress Notes (Unsigned)
   PRENATAL VISIT NOTE  Subjective:  Selena Meyer is a 37 y.o. G1P0 at [redacted]w[redacted]d being seen today for ongoing prenatal care.  She is currently monitored for the following issues for this {Blank single:19197::"high-risk","low-risk"} pregnancy and has Tobacco abuse; Proteinuria; Uncontrolled hypertension; Systolic murmur; Episodic tension-type headache, not intractable; Polycystic ovaries; FSGS (focal segmental glomerulosclerosis); Lumbar radiculopathy; Stage 2 chronic kidney disease; Elevated cholesterol with high triglycerides; Hypertension secondary to other renal disorders; Hypokalemia; SVT (supraventricular tachycardia); IDA (iron deficiency anemia); Supervision of high risk pregnancy, antepartum; and Gestational diabetes mellitus (GDM), antepartum on their problem list.  Patient reports {sx:14538}.  Contractions: Not present. Vag. Bleeding: None.  Movement: Absent. Denies leaking of fluid.   The following portions of the patient's history were reviewed and updated as appropriate: allergies, current medications, past family history, past medical history, past social history, past surgical history and problem list.   Objective:   Vitals:   05/04/22 1555  BP: (!) 146/85  Pulse: 87  Weight: 192 lb (87.1 kg)    Fetal Status: Fetal Heart Rate (bpm): 162   Movement: Absent     General:  Alert, oriented and cooperative. Patient is in no acute distress.  Skin: Skin is warm and dry. No rash noted.   Cardiovascular: Normal heart rate noted  Respiratory: Normal respiratory effort, no problems with respiration noted  Abdomen: Soft, gravid, appropriate for gestational age.  Pain/Pressure: Absent      Assessment and Plan:  Pregnancy: G1P0 at [redacted]w[redacted]d 1. Supervision of high risk pregnancy, antepartum ***  2. [redacted] weeks gestation of pregnancy ***  3. Gestational diabetes mellitus (GDM) controlled on oral hypoglycemic drug, antepartum ***  4. SVT (supraventricular tachycardia) ***  5. Hypertension  secondary to other renal disorders ***  6. Stage 2 chronic kidney disease ***  7. FSGS (focal segmental glomerulosclerosis) ***   {Routine Plan:28585}  {Blank single:19197::"Term","Preterm"} labor symptoms and general obstetric precautions including but not limited to vaginal bleeding, contractions, leaking of fluid and fetal movement were reviewed in detail with the patient. Please refer to After Visit Summary for other counseling recommendations.   Return in about 4 weeks (around 06/01/2022) for return OB in 4 weeks.  Future Appointments  Date Time Provider Department Center  05/09/2022  8:45 AM [facility] NURSE [facility]  05/09/2022  9:00 AM [facility] MD [facility]  05/16/2022  9:15 AM [facility]EDUCATION [facility]  05/16/2022  1:40 PM [name], NP [facility] None  05/29/2022  8:10 AM Lesly Dukes, MD [facility]  05/31/2022  8:45 AM [facility] NURSE [facility]  05/31/2022  9:00 AM [facility] US1 [facility]  06/23/2022 10:30 AM [facility] LAB [facility] None  06/23/2022 10:45 AM Erenest Blank, NP [facility] None    Lennart Pall, MD

## 2022-05-08 ENCOUNTER — Other Ambulatory Visit: Payer: Self-pay | Admitting: Obstetrics

## 2022-05-08 ENCOUNTER — Other Ambulatory Visit: Payer: Self-pay

## 2022-05-08 DIAGNOSIS — O24415 Gestational diabetes mellitus in pregnancy, controlled by oral hypoglycemic drugs: Secondary | ICD-10-CM

## 2022-05-08 DIAGNOSIS — R0602 Shortness of breath: Secondary | ICD-10-CM | POA: Diagnosis not present

## 2022-05-08 DIAGNOSIS — I471 Supraventricular tachycardia, unspecified: Secondary | ICD-10-CM | POA: Diagnosis not present

## 2022-05-08 DIAGNOSIS — Z3A15 15 weeks gestation of pregnancy: Secondary | ICD-10-CM

## 2022-05-08 DIAGNOSIS — I10 Essential (primary) hypertension: Secondary | ICD-10-CM | POA: Diagnosis not present

## 2022-05-08 DIAGNOSIS — O10919 Unspecified pre-existing hypertension complicating pregnancy, unspecified trimester: Secondary | ICD-10-CM

## 2022-05-08 DIAGNOSIS — N051 Unspecified nephritic syndrome with focal and segmental glomerular lesions: Secondary | ICD-10-CM

## 2022-05-08 DIAGNOSIS — I151 Hypertension secondary to other renal disorders: Secondary | ICD-10-CM | POA: Diagnosis not present

## 2022-05-08 DIAGNOSIS — O10212 Pre-existing hypertensive chronic kidney disease complicating pregnancy, second trimester: Secondary | ICD-10-CM

## 2022-05-08 DIAGNOSIS — O99332 Smoking (tobacco) complicating pregnancy, second trimester: Secondary | ICD-10-CM

## 2022-05-08 DIAGNOSIS — Z1331 Encounter for screening for depression: Secondary | ICD-10-CM | POA: Diagnosis not present

## 2022-05-09 ENCOUNTER — Ambulatory Visit: Payer: BC Managed Care – PPO

## 2022-05-09 ENCOUNTER — Ambulatory Visit: Payer: BC Managed Care – PPO | Admitting: *Deleted

## 2022-05-09 ENCOUNTER — Ambulatory Visit (HOSPITAL_BASED_OUTPATIENT_CLINIC_OR_DEPARTMENT_OTHER): Payer: BC Managed Care – PPO | Admitting: Maternal & Fetal Medicine

## 2022-05-09 ENCOUNTER — Ambulatory Visit: Payer: BC Managed Care – PPO | Attending: Obstetrics

## 2022-05-09 VITALS — BP 134/86 | HR 78

## 2022-05-09 DIAGNOSIS — O09512 Supervision of elderly primigravida, second trimester: Secondary | ICD-10-CM | POA: Diagnosis not present

## 2022-05-09 DIAGNOSIS — O99012 Anemia complicating pregnancy, second trimester: Secondary | ICD-10-CM | POA: Diagnosis not present

## 2022-05-09 DIAGNOSIS — Z7982 Long term (current) use of aspirin: Secondary | ICD-10-CM | POA: Insufficient documentation

## 2022-05-09 DIAGNOSIS — O358XX0 Maternal care for other (suspected) fetal abnormality and damage, not applicable or unspecified: Secondary | ICD-10-CM | POA: Insufficient documentation

## 2022-05-09 DIAGNOSIS — N051 Unspecified nephritic syndrome with focal and segmental glomerular lesions: Secondary | ICD-10-CM | POA: Insufficient documentation

## 2022-05-09 DIAGNOSIS — O0992 Supervision of high risk pregnancy, unspecified, second trimester: Secondary | ICD-10-CM | POA: Insufficient documentation

## 2022-05-09 DIAGNOSIS — O99332 Smoking (tobacco) complicating pregnancy, second trimester: Secondary | ICD-10-CM | POA: Insufficient documentation

## 2022-05-09 DIAGNOSIS — I471 Supraventricular tachycardia, unspecified: Secondary | ICD-10-CM | POA: Diagnosis not present

## 2022-05-09 DIAGNOSIS — N182 Chronic kidney disease, stage 2 (mild): Secondary | ICD-10-CM

## 2022-05-09 DIAGNOSIS — O10919 Unspecified pre-existing hypertension complicating pregnancy, unspecified trimester: Secondary | ICD-10-CM

## 2022-05-09 DIAGNOSIS — D649 Anemia, unspecified: Secondary | ICD-10-CM

## 2022-05-09 DIAGNOSIS — O09522 Supervision of elderly multigravida, second trimester: Secondary | ICD-10-CM | POA: Diagnosis not present

## 2022-05-09 DIAGNOSIS — O3509X0 Maternal care for (suspected) other central nervous system malformation or damage in fetus, not applicable or unspecified: Secondary | ICD-10-CM

## 2022-05-09 DIAGNOSIS — O0990 Supervision of high risk pregnancy, unspecified, unspecified trimester: Secondary | ICD-10-CM

## 2022-05-09 DIAGNOSIS — O10212 Pre-existing hypertensive chronic kidney disease complicating pregnancy, second trimester: Secondary | ICD-10-CM | POA: Diagnosis not present

## 2022-05-09 DIAGNOSIS — I129 Hypertensive chronic kidney disease with stage 1 through stage 4 chronic kidney disease, or unspecified chronic kidney disease: Secondary | ICD-10-CM

## 2022-05-09 DIAGNOSIS — O24415 Gestational diabetes mellitus in pregnancy, controlled by oral hypoglycemic drugs: Secondary | ICD-10-CM | POA: Insufficient documentation

## 2022-05-09 DIAGNOSIS — Z363 Encounter for antenatal screening for malformations: Secondary | ICD-10-CM | POA: Diagnosis not present

## 2022-05-09 DIAGNOSIS — O99412 Diseases of the circulatory system complicating pregnancy, second trimester: Secondary | ICD-10-CM | POA: Diagnosis not present

## 2022-05-09 DIAGNOSIS — Z3A15 15 weeks gestation of pregnancy: Secondary | ICD-10-CM | POA: Diagnosis not present

## 2022-05-09 DIAGNOSIS — I10 Essential (primary) hypertension: Secondary | ICD-10-CM | POA: Insufficient documentation

## 2022-05-09 MED ORDER — ASPIRIN 81 MG PO TBEC: 162.0000 mg | DELAYED_RELEASE_TABLET | Freq: Every day | ORAL | 12 refills | Status: DC

## 2022-05-09 NOTE — Progress Notes (Addendum)
Patient information  Patient Name: Selena Meyer  Patient MRN:   [id number]  Referring practice: MFM Referring Provider: [facility])  MFM CONSULT  Selena Meyer is a 37 y.o. G1P0 at [redacted]w[redacted]d here for ultrasound and consultation.   RE renal disease in pregnancy: Patient has a history of chronic kidney disease likely due to focal segmental glomerulosclerosis.  She reports that she had routine screening on blood work and was known to have an elevated creatinine.  She saw nephrology who recommended a biopsy at some point which was very difficult to diagnose ultimately.  She reports that multiple pathologist had to assess the specimen before considering the final diagnosis of FGF S.  Prepregnancy her creatinine ranged from 1.2-1.37.  Currently it was 0.98 on her most recent laboratory assessment.  She recently saw her cardiologist recommended switching her medications from metoprolol and Norvasc to nifedipine in the late labetalol.  She was seeing cardiology for SVT but has felt like that has been under better control since pregnancy.  I stated that it was okay for her to switch over her medications to achieve better blood pressure control.  She needs to continue to follow with her cardiologist and notify her nephrologist that she is indeed pregnant.  I discussed the potential pregnancy complications with chronic kidney disease in pregnancy.  I discussed that she does not have an absolute contraindication since her creatinine is less than 1.4-2.  However, she is at elevated risk for superimposed preeclampsia, fetal growth restriction, early delivery in the subsequent newborn complications associated with preterm birth.  I discussed the typical pregnancy plan involves multiple ultrasounds involving a detailed ultrasound around 18 to 19 weeks, serial growth ultrasounds throughout the pregnancy and weekly antenatal tilt testing starting around 28 to 32 weeks.  Delivery timing depends on  the clinical course but is often around 36 to 37 weeks.  I briefly discussed that sometimes early onset growth restriction and preeclampsia occur requiring the pregnancy to either be terminated or the patient to also go on dialysis.  I discussed that while this is not likely these are potential concerning outcomes.  The patient also has early onset gestational diabetes and anemia that is being managed by her OB provider.  She has no further questions about these conditions.  RE US findings: Due to the gestational age and fetal position is difficult to appreciate various fetal structures.  However, I discussed that there is concern for an abnormal cerebellum and boarderline ventriculomegaly.  I discussed this could be a normal variant and that we need more time to assess but that also could represent a neural tube defect.  Based on the views of the spine today there is no evidence of an open neural tube defect but we will have her return in a few weeks to reassess. I do recommend she have an AFP to help aid in the diagnosis of an open neural tube defect.   Sonographic findings Single intrauterine pregnancy. Fetal cardiac activity:  Observed and appears normal. Presentation: Variable. Due to the gestational age and fetal position is difficult to appreciate various fetal structures.  However, I discussed that there is concern for an abnormal cerebellum and boarderline ventriculomegaly.  I discussed this could be a normal variant and that we need more time to assess but that also could represent a neural tube defect.  Based on the views of the spine today there is no evidence of an open neural tube  defect but we will have her return in a few weeks to reassess. Fetal biometry shows the estimated fetal weight at the 38 percentile. Amniotic fluid volume: Within normal limits. MVP: 4.75 cm. Placenta: Anterior.  Assessment FSGS (focal segmental glomerulosclerosis)  Stage 2 chronic kidney disease Possible  fetal abnormality, abnormal cerebellum and borderline ventriculomegaly LV hypertrophy (last documented in 2019) Plan -Basic anatomy ultrasound shows possible signs of an open neural tube defect due to the presence of a banana-shaped cerebellum in addition to borderline ventriculomegaly.  However, the fetal spine appears normal but is limited due to fetal position. AFP should be drawn before 20 weeks to help aid in the diagnosis.  -Detailed [place] to be done around 19 weeks. We should be able to better see a neural tube defect at this time.  -Baseline preeclampsia labs: CMP, CBC, and urine protein/creatinine have been completed.  -Ok to switch Norvasc and Metoprolol to Labetalol and Procardia per cardiology suggestion. This will allow better blood pressure control to allow for higher dose of CCB and utilization of more commonly used BP medication in pregnancy -Start Aspirin 162 mg for preeclampsia prophylaxis -AFP should be drawn at 15-20 weeks -Follow-up anatomy and fetal growth in 4 to 6 weeks -Serial growth ultrasounds starting around 28 weeks to monitor for fetal growth restriction -Maternal echo due to hx of HTN and hx of LV hypertrophy -Fetal echo should be arranged around 21-23 weeks due to early onset gestational diabetes in pregnancy  -Antenatal testing to start around 32 weeks due to the increased risk of stillbirth and high risk pregnancy -Delivery timing pending clinical course  -Continue routine prenatal care with referring OB provider  Review of Systems: A review of systems was performed and was negative except per HPI   Vitals and Physical Exam    05/09/2022    1:17 PM 05/04/2022    3:55 PM 04/03/2022   11:29 AM  Vitals with BMI  Weight  192 lbs   Systolic 134 146 161  Diastolic 86 85 97  Pulse 78 87   Sitting comfortably on the sonogram table Nonlabored breathing Normal rate and rhythm Abdomen is nontender  Past pregnancies OB History  Gravida Para Term Preterm AB Living   1            SAB IAB Ectopic Multiple Live Births               # Outcome Date GA Lbr Len/2nd Weight Sex Delivery Anes PTL Lv  1 Current              I spent 60 minutes reviewing the patients chart, including labs and images as well as counseling the patient about her medical conditions. Greater than 50% of the time was spent in direct face-to-face patient counseling.  Braxton Feathers  MFM, [place]   05/09/2022  1:24 PM   ADDENDUM: This patient was discussed on Red Chart rounds and updates have been made to reflect the care of this patient.

## 2022-05-10 ENCOUNTER — Encounter: Payer: Self-pay | Admitting: Obstetrics & Gynecology

## 2022-05-10 DIAGNOSIS — Z419 Encounter for procedure for purposes other than remedying health state, unspecified: Secondary | ICD-10-CM | POA: Diagnosis not present

## 2022-05-11 ENCOUNTER — Encounter: Payer: Self-pay | Admitting: Obstetrics & Gynecology

## 2022-05-11 DIAGNOSIS — O26839 Pregnancy related renal disease, unspecified trimester: Secondary | ICD-10-CM | POA: Insufficient documentation

## 2022-05-15 ENCOUNTER — Encounter (INDEPENDENT_AMBULATORY_CARE_PROVIDER_SITE_OTHER): Payer: Self-pay

## 2022-05-16 ENCOUNTER — Encounter: Payer: BC Managed Care – PPO | Attending: Obstetrics & Gynecology | Admitting: Registered"

## 2022-05-16 ENCOUNTER — Encounter: Payer: Self-pay | Admitting: Medical-Surgical

## 2022-05-16 ENCOUNTER — Ambulatory Visit (INDEPENDENT_AMBULATORY_CARE_PROVIDER_SITE_OTHER): Payer: BC Managed Care – PPO | Admitting: Registered"

## 2022-05-16 ENCOUNTER — Ambulatory Visit (INDEPENDENT_AMBULATORY_CARE_PROVIDER_SITE_OTHER): Payer: BC Managed Care – PPO | Admitting: Medical-Surgical

## 2022-05-16 ENCOUNTER — Other Ambulatory Visit: Payer: Self-pay

## 2022-05-16 VITALS — BP 117/76 | HR 90 | Resp 20 | Ht 65.0 in | Wt 192.2 lb

## 2022-05-16 DIAGNOSIS — O24415 Gestational diabetes mellitus in pregnancy, controlled by oral hypoglycemic drugs: Secondary | ICD-10-CM

## 2022-05-16 DIAGNOSIS — O219 Vomiting of pregnancy, unspecified: Secondary | ICD-10-CM | POA: Diagnosis not present

## 2022-05-16 DIAGNOSIS — Z3A16 16 weeks gestation of pregnancy: Secondary | ICD-10-CM

## 2022-05-16 DIAGNOSIS — Z3A17 17 weeks gestation of pregnancy: Secondary | ICD-10-CM

## 2022-05-16 DIAGNOSIS — O10919 Unspecified pre-existing hypertension complicating pregnancy, unspecified trimester: Secondary | ICD-10-CM

## 2022-05-16 DIAGNOSIS — N182 Chronic kidney disease, stage 2 (mild): Secondary | ICD-10-CM

## 2022-05-16 MED ORDER — FREESTYLE LIBRE 3 SENSOR MISC
1 refills | Status: DC
Start: 2022-05-16 — End: 2022-06-22

## 2022-05-16 NOTE — Progress Notes (Signed)
        Established patient visit  History, exam, impression, and plan:  1. Gestational diabetes mellitus (GDM) controlled on oral hypoglycemic drug, antepartum Pleasant 37 year old female who is currently almost [redacted] weeks pregnant and under the care of of OB/GYN and maternal-fetal medicine.  She has been diagnosed with gestational diabetes and reports that she is checking her sugar approximately 4 times daily.  Having significant issues with p.o. intake due to pregnancy related nausea and wonders if she needs to continue checking her sugars as often even if she is unable to hold down food.  We did discuss recommendations for monitoring glucose during pregnancy.  Sample of freestyle libre 3 provided in office today for her to try.  It would be optimal if we can get this covered through her insurance.  She notes that she has [employer] but also Medicaid and feels that this should be approved with the frequency she is having to monitor her sugars.  Sending this into her pharmacy today. - Continuous Glucose Sensor (FREESTYLE LIBRE 3 SENSOR) MISC; Place 1 sensor on the skin every 14 days. Use to check glucose continuously  Dispense: 6 each; Refill: 1  2. Pregnancy related nausea and vomiting, antepartum She is currently on Zofran as prescribed by her OB/GYN but this is not helping.  She has tried ginger which has also lost effectiveness over the last few weeks.  Having difficulty holding down regular foods and has only been able to tolerate rice more recently.  Interested in other options to help manage her nausea.  Discussed the risk of nausea medications during pregnancy.  Advised her to touch base with her OB/GYN/maternal-fetal medicine to discuss the recommendations.  We did review some dietary and lifestyle options that may be beneficial.  Recommend eating very small frequent meals/snacks that contain some protein as this will help prevent hypoglycemia.  If she avoids letting her stomach it  completely empty, this may actually help with the nausea.  3. Stage 2 chronic kidney disease She has received the recommendation from her OB/GYN that she should reestablish with nephrology.  She does still have a patient relationship with them and plans to contact them to schedule a follow-up.  4. Chronic hypertension during pregnancy, antepartum Managed by cardiology.  She is currently taking amlodipine and metoprolol as prescribed.  Was advised to check her blood pressure twice daily over the next 5 days and send the readings to cardiology where they will make a decision on what to switch her medications to that is pregnancy friendly.  Reports that they may look to switch her to labetalol.  Procedures performed this visit: None.  Return for Follow-up after cleared by OB/GYN or sooner if needed.  __________________________________ Thayer Ohm, DNP, APRN, FNP-BC Primary Care and Sports Medicine [facility]

## 2022-05-16 NOTE — Progress Notes (Signed)
Patient was seen for initial GDM self-management on 04/27/22  Follow up today 05/16/2022  Start time 0914 and End time 0957  Estimated due date: 10/25/22; [redacted]w[redacted]d  Next ob visit 05/29/22 at [facility] with Leggett  Clinical: Medications: Metformin 500 mg bid Medical History: PCOS, Stage 2 CKD, HTN, anemia Labs:  No results found for: "HGBA1C"   Dietary and Lifestyle History: Pt is tracking blood sugar on her MySugr app, does not have log sheet. Readings are 79-198 mg/dL. Pt has started metformin bid and denies side effects. FBS appears to be 96-131 mg/dL.  Pt states she continues to have N/V. Pt is not regularly checking blood sugar due to not sure about checking after she vomits.  Pt states last night she was able to keep down shrimp fried rice and corn. 2-hr ppbg was 159 mg/dL  Pt had questions about hypoglycemia, yesterday was concerned about reading of 79 mg/dL when she woke up from a nap. Pt states she felt fine. The reading showed up as yellow instead of green in her app. RD changed settings for GDM and now shows as a green (within range) value.  Pt states she doesn't want to use insulin because she was told that she would still need to use it after delivery.  Physical Activity: not assessed Stress: not assessed Sleep: not assessed  24 hr Recall: not assessed today   NUTRITION INTERVENTION  Nutrition education (E-1) on the following topics:   Initial Follow-up  [x]  []  Definition of Gestational Diabetes []  []  Why dietary management is important in controlling blood glucose [x]  []  Effects each nutrient has on blood glucose levels []  []  Simple carbohydrates vs complex carbohydrates [x]  []  Fluid intake [x]  [x]  Creating a balanced meal plan [x]  []  Carbohydrate counting  [x]  []  When to check blood glucose levels [x]  []  Proper blood glucose monitoring techniques [x]  []  Effect of stress and stress reduction techniques  [x]  []  Exercise effect on blood glucose levels, appropriate  exercise during pregnancy []  []  Importance of limiting caffeine and abstaining from alcohol and smoking []  [x]  Medications used for blood sugar control during pregnancy []  [x]  Hypoglycemia and rule of 15 []  []  Postpartum self care  Plan: Pt advised to eat small frequent snacks to help reduce N/V. Also to limit high fat foods and balance carbs with protein. Limit rice and pasta to 1/2 cup or less and use whole grain varieties.  Patient received handouts: N/V and heartburn in pregnancy  Patient will be seen for follow-up in 3 weeks as needed.

## 2022-05-17 ENCOUNTER — Encounter: Payer: Self-pay | Admitting: Obstetrics and Gynecology

## 2022-05-17 DIAGNOSIS — O219 Vomiting of pregnancy, unspecified: Secondary | ICD-10-CM

## 2022-05-17 MED ORDER — ONDANSETRON HCL 8 MG PO TABS
8.0000 mg | ORAL_TABLET | Freq: Three times a day (TID) | ORAL | 1 refills | Status: DC | PRN
Start: 2022-05-17 — End: 2022-10-02

## 2022-05-19 ENCOUNTER — Other Ambulatory Visit: Payer: Self-pay | Admitting: Medical-Surgical

## 2022-05-19 ENCOUNTER — Ambulatory Visit (INDEPENDENT_AMBULATORY_CARE_PROVIDER_SITE_OTHER): Payer: BC Managed Care – PPO | Admitting: Cardiology

## 2022-05-19 ENCOUNTER — Encounter: Payer: Self-pay | Admitting: Cardiology

## 2022-05-19 VITALS — BP 150/110 | HR 77 | Ht 65.0 in | Wt 192.2 lb

## 2022-05-19 DIAGNOSIS — N182 Chronic kidney disease, stage 2 (mild): Secondary | ICD-10-CM

## 2022-05-19 DIAGNOSIS — I517 Cardiomegaly: Secondary | ICD-10-CM | POA: Diagnosis not present

## 2022-05-19 DIAGNOSIS — O10919 Unspecified pre-existing hypertension complicating pregnancy, unspecified trimester: Secondary | ICD-10-CM | POA: Diagnosis not present

## 2022-05-19 DIAGNOSIS — O09519 Supervision of elderly primigravida, unspecified trimester: Secondary | ICD-10-CM

## 2022-05-19 DIAGNOSIS — Z3A17 17 weeks gestation of pregnancy: Secondary | ICD-10-CM

## 2022-05-19 DIAGNOSIS — O0990 Supervision of high risk pregnancy, unspecified, unspecified trimester: Secondary | ICD-10-CM

## 2022-05-19 MED ORDER — LABETALOL HCL 100 MG PO TABS: 100.0000 mg | ORAL_TABLET | Freq: Three times a day (TID) | ORAL | 3 refills | Status: DC

## 2022-05-19 NOTE — Patient Instructions (Addendum)
Medication Instructions:  Your physician has recommended you make the following change in your medication:  STOP: Lopressor START: Labetalol 100 mg every 8 hours *If you need a refill on your cardiac medications before your next appointment, please call your pharmacy*   Lab Work: None   Testing/Procedures: None   Follow-Up: At [facility], you and your health needs are our priority.  As part of our continuing mission to provide you with exceptional heart care, we have created designated Provider Care Teams.  These Care Teams include your primary Cardiologist (physician) and Advanced Practice Providers (APPs -  Physician Assistants and Nurse Practitioners) who all work together to provide you with the care you need, when you need it.    Your next appointment:    May 17th at [facility]  Provider:   Thomasene Ripple, DO

## 2022-05-19 NOTE — Progress Notes (Signed)
Cardio-Obstetrics Clinic  New Evaluation  Date:  05/19/2022   ID:  [name], DO B [birth year], MRN [id number]  PCP:  [name], NP   [facility] Providers Cardiologist:  Thomasene Ripple, DO  Electrophysiologist:  None       Referring MD: [name], NP   Chief Complaint: " I am ok"  History of Present Illness:    Selena Meyer is a 37 y.o. female [G1P0] who is being seen today for the evaluation of chronic hypertension  at the request of [name], NP.   Medical history includes SVT, Hypertension, hypertriglyceridemia, follows with cardiology and [facility].  She was referred to the cardiology clinic by her OB/GYN team.  Currently she is on amlodipine and metoprolol.  Per notes she was also on hydrochlorothiazide 25 mg daily not sure when this was taken off.  She offers no specific complaints in the office today.  She is here with her mother.   Review she had an echocardiogram in 2022 showed EF 60 to 55%. Currently [redacted] weeks pregnant   Prior CV Studies Reviewed: The following studies were reviewed today:  TTE 2019 Study Conclusions   - Left ventricle: The cavity size was normal. Wall thickness was    increased in a pattern of moderate LVH. Systolic function was    normal. Wall motion was normal; there were no regional wall    motion abnormalities. Doppler parameters are consistent with    abnormal left ventricular relaxation (grade 1 diastolic    dysfunction).   -------------------------------------------------------------------  Study data:  No prior study was available for comparison.  Study  status:  Routine.  Procedure:  Transthoracic echocardiography.  Image quality was adequate.  Study completion:  There were no  complications.          Transthoracic echocardiography.  M-mode,  complete 2D, spectral Doppler, and color Doppler.  Birthdate:  Patient birthdate: 07-23-1985.  Age:  Patient is 37 yr old.  Sex:  Gender: female.    BMI: 32.7  kg/m^2.  Blood pressure:     140/101  Patient status:  Outpatient.  Study date:  Study date: 08/01/2017.  Study time: 10:14 AM.  Location:  Echo laboratory.   -------------------------------------------------------------------   -------------------------------------------------------------------  Left ventricle:  The cavity size was normal. Wall thickness was  increased in a pattern of moderate LVH. Systolic function was  normal. Wall motion was normal; there were no regional wall motion  abnormalities. Doppler parameters are consistent with abnormal left  ventricular relaxation (grade 1 diastolic dysfunction).   -------------------------------------------------------------------  Aortic valve:   Structurally normal valve. Trileaflet; normal  thickness leaflets. Cusp separation was normal. Mobility was not  restricted.  Doppler:  Transvalvular velocity was within the normal  range. There was no stenosis. There was no regurgitation.   -------------------------------------------------------------------  Aorta: The aorta was normal, not dilated, and non-diseased. Aortic  root: The aortic root was normal in size.   -------------------------------------------------------------------  Mitral valve:   Structurally normal valve.   Leaflet separation was  normal. Mobility was not restricted.  Doppler:  Transvalvular  velocity was within the normal range. There was no evidence for  stenosis. There was no regurgitation.    Peak gradient (D): 2 mm  Hg.   -------------------------------------------------------------------  Left atrium:  The atrium was normal in size.   -------------------------------------------------------------------  Atrial septum:  The septum was normal.   -------------------------------------------------------------------  Pulmonary veins:  Common pulmonary vein:  The Doppler velocity and flow profile were  normal.    -------------------------------------------------------------------  Right ventricle:  The cavity size was normal. Wall thickness was  normal. Systolic function was normal.   -------------------------------------------------------------------  Pulmonic valve:    Structurally normal valve.   Cusp separation was  normal.  Doppler:  Transvalvular velocity was within the normal  range. There was no evidence for stenosis. There was no  regurgitation.   -------------------------------------------------------------------  Tricuspid valve:   Structurally normal valve.   Leaflet separation  was normal.  Doppler:  Transvalvular velocity was within the normal  range. There was no regurgitation.   -------------------------------------------------------------------  Pulmonary artery:   The main pulmonary artery was normal-sized.  Systolic pressure was within the normal range.   -------------------------------------------------------------------  Right atrium:  The atrium was normal in size.   -------------------------------------------------------------------  Pericardium: The pericardium was normal in appearance. There was  no pericardial effusion.   -------------------------------------------------------------------  Systemic veins:  Inferior vena cava: The vessel was normal in size. The  respirophasic diameter changes were in the normal range (>= 50%),  consistent with normal central venous pressure.   -------------------------------------------------------------------  Post procedure conclusions  Ascending Aorta:   - The aorta was normal, not dilated, and non-diseased.   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    Hypertension    Irregular menstrual cycle    Irregular periods/menstrual cycles  05/03/2017   Will hold off on Provera or other fertility stuff for now, pt advised pregnancy is not recommended until BP under better control at least, plus other workup pen   Proteinuria 02/28/2013   Uncontrolled hypertension 05/10/2017    No past surgical history on file.    OB History     Gravida  1   Para      Term      Preterm      AB      Living         SAB      IAB      Ectopic      Multiple      Live Births                  Current Medications: Current Meds  Medication Sig   Accu-Chek Softclix Lancets lancets 100 each by Other route 4 (four) times daily. Use as instructed   acetaminophen (TYLENOL) 500 MG tablet Take 1,000 mg by mouth every 6 (six) hours as needed for mild pain.    albuterol (VENTOLIN HFA) 108 (90 Base) MCG/ACT inhaler Inhale 2 puffs into the lungs every 6 (six) hours as needed for wheezing or shortness of breath.   aspirin EC 81 MG tablet Take 2 tablets (162 mg total) by mouth daily. Swallow whole.   Continuous Glucose Sensor (FREESTYLE LIBRE 3 SENSOR) MISC Place 1 sensor on the skin every 14 days. Use to check glucose continuously   Cranberry 500 MG CAPS Take 1 capsule by mouth daily.   ferrous sulfate 325 (65 FE) MG tablet Take by mouth.   glucose blood test strip Use as instructed   ipratropium (ATROVENT) 0.06 % nasal spray Place 2 sprays into both nostrils 4 (four) times daily. As needed for runny nose / postnasal drip   labetalol (NORMODYNE) 100 MG tablet Take 1 tablet (100 mg total) by mouth every 8 (eight) hours.   magnesium oxide (MAG-OX) 400 MG  tablet Take 0.5 tablets by mouth daily.   metFORMIN (GLUCOPHAGE) 500 MG tablet Take 1 tablet (500 mg total) by mouth 2 (two) times daily with a meal.   Omega-3 Fatty Acids (FISH OIL) 1000 MG CAPS Take by mouth.   ondansetron (ZOFRAN) 8 MG tablet Take 1 tablet (8 mg total) by mouth every 8 (eight) hours as needed for nausea or vomiting.   Prenatal Vit-Fe Fumarate-FA  (MULTIVITAMIN-PRENATAL) 27-0.8 MG TABS tablet Take 1 tablet by mouth daily at 12 noon.   [DISCONTINUED] amLODipine (NORVASC) 10 MG tablet Take 1 tablet (10 mg total) by mouth daily.   [DISCONTINUED] metoprolol tartrate (LOPRESSOR) 100 MG tablet Take 100 mg by mouth 2 (two) times daily.   [DISCONTINUED] metoprolol tartrate (LOPRESSOR) 50 MG tablet Take 50 mg by mouth 2 (two) times daily.     Allergies:   Ibuprofen   Social History   Socioeconomic History   Marital status: Married    Spouse name: Ivar Drape   Number of children: 0   Years of education: Not on file   Highest education level: Not on file  Occupational History    Employer: UNEMPLOYED  Tobacco Use   Smoking status: Former    Packs/day: 0.25    Years: 5.00    Additional pack years: 0.00    Total pack years: 1.25    Types: Cigarettes    Quit date: 05/16/2021    Years since quitting: 1.0   Smokeless tobacco: Never   Tobacco comments:    Quit smoking 2023-06-10after her mom passed away  Vaping Use   Vaping Use: Never used  Substance and Sexual Activity   Alcohol use: Yes    Comment: "occasional use; social drinker"   Drug use: No   Sexual activity: Yes    Partners: Male    Birth control/protection: None  Other Topics Concern   Not on file  Social History Narrative   Not on file   Social Determinants of Health   Financial Resource Strain: Not on file  Food Insecurity: Not on file  Transportation Needs: Not on file  Physical Activity: Not on file  Stress: Not on file  Social Connections: Not on file      Family History  Problem Relation Age of Onset   Diabetes Mother    High blood pressure Mother    Renal Disease Mother    Diabetes Sister    Cancer Maternal Grandmother    Diabetes Maternal Grandmother    High blood pressure Maternal Grandmother    Cancer Maternal Aunt        breast   Other Father       ROS:   Please see the history of present illness.     All other systems reviewed and are  negative.   Labs/EKG Reviewed:    EKG:   EKG is was not ordered today.    Recent Labs: 04/03/2022: ALT 15; BUN 9; Creatinine, Ser 0.98; Hemoglobin 14.8; Platelets 343; Potassium 4.1; Sodium 136   Recent Lipid Panel Lab Results  Component Value Date/Time   CHOL 240 (H) 11/15/2021 12:00 AM   TRIG 492 (H) 11/15/2021 12:00 AM   HDL 40 (L) 11/15/2021 12:00 AM   CHOLHDL 6.0 (H) 11/15/2021 12:00 AM   LDLCALC  11/15/2021 12:00 AM     Comment:     . LDL cholesterol not calculated. Triglyceride levels greater than 400 mg/dL invalidate calculated LDL results. . Reference range: <100 . Desirable range <100 mg/dL for primary  prevention;   <70 mg/dL for patients with CHD or diabetic patients  with > or = 2 CHD risk factors. [name] LDL-C is now calculated using the Martin-Hopkins  calculation, which is a validated novel method providing  better accuracy than the Friedewald equation in the  estimation of LDL-C.  Horald Pollen et al. Lenox Ahr. 1610;960(45): 2061-2068  (http://education.[employer].com/faq/FAQ164)     Physical Exam:    VS:  BP (!) 150/110 (BP Location: Left Arm)   Pulse 77   Ht 5\' 5"  (1.651 m)   Wt 192 lb 3.2 oz (87.2 kg)   LMP 01/18/2022   SpO2 99%   BMI 31.98 kg/m     Wt Readings from Last 3 Encounters:  05/19/22 192 lb 3.2 oz (87.2 kg)  05/16/22 192 lb 3.2 oz (87.2 kg)  05/04/22 192 lb (87.1 kg)     GEN:  Well nourished, well developed in no acute distress HEENT: Normal NECK: No JVD; No carotid bruits LYMPHATICS: No lymphadenopathy CARDIAC: RRR, no murmurs, rubs, gallops RESPIRATORY:  Clear to auscultation without rales, wheezing or rhonchi  ABDOMEN: Soft, non-tender, non-distended MUSCULOSKELETAL:  No edema; No deformity  SKIN: Warm and dry NEUROLOGIC:  Alert and oriented x 3 PSYCHIATRIC:  Normal affect    Risk Assessment/Risk Calculators:     CARPREG II Risk Prediction Index Score:  1.  The patient's risk for a primary cardiac event is 5%.             ASSESSMENT & PLAN:    Chronic hypertension in pregnancy  Obesity  SVT  She is hypertensive in the office today.  We talked about adjusting her antihypertensive medications.  I like to stop the Lopressor and transition her to labetalol 100 mg every 8 hours which will allow me to also increase the dosage as appropriate.  Amlodipine is fine we will keep that during her pregnancy.  I will see the patient back in 1 week.  The patient understands the need to lose weight with diet and exercise. We have discussed specific strategies for this.  Patient Instructions  Medication Instructions:  Your physician has recommended you make the following change in your medication:  STOP: Lopressor START: Labetalol 100 mg every 8 hours *If you need a refill on your cardiac medications before your next appointment, please call your pharmacy*   Lab Work: None   Testing/Procedures: None   Follow-Up: At [facility], you and your health needs are our priority.  As part of our continuing mission to provide you with exceptional heart care, we have created designated Provider Care Teams.  These Care Teams include your primary Cardiologist (physician) and Advanced Practice Providers (APPs -  Physician Assistants and Nurse Practitioners) who all work together to provide you with the care you need, when you need it.    Your next appointment:    May 17th at [facility]  Provider:   Thomasene Ripple, DO    Dispo:  No follow-ups on file.   Medication Adjustments/Labs and Tests Ordered: Current medicines are reviewed at length with the patient today.  Concerns regarding medicines are outlined above.  Tests Ordered: No orders of the defined types were placed in this encounter.  Medication Changes: Meds ordered this encounter  Medications   labetalol (NORMODYNE) 100 MG tablet    Sig: Take 1 tablet (100 mg total) by mouth every 8 (eight) hours.    Dispense:  270 tablet    Refill:  3

## 2022-05-23 ENCOUNTER — Ambulatory Visit
Admission: EM | Admit: 2022-05-23 | Discharge: 2022-05-23 | Disposition: A | Discharge status: Other Acute Inpatient Hospital | Payer: BC Managed Care – PPO

## 2022-05-23 ENCOUNTER — Inpatient Hospital Stay (HOSPITAL_COMMUNITY)
Admission: AD | Admit: 2022-05-23 | Discharge: 2022-05-23 | Disposition: A | Discharge status: Home / Self Care | Payer: BC Managed Care – PPO | Attending: Obstetrics & Gynecology | Admitting: Obstetrics & Gynecology

## 2022-05-23 ENCOUNTER — Encounter (HOSPITAL_COMMUNITY): Payer: Self-pay | Admitting: Obstetrics & Gynecology

## 2022-05-23 DIAGNOSIS — Z87891 Personal history of nicotine dependence: Secondary | ICD-10-CM | POA: Insufficient documentation

## 2022-05-23 DIAGNOSIS — O10919 Unspecified pre-existing hypertension complicating pregnancy, unspecified trimester: Secondary | ICD-10-CM

## 2022-05-23 DIAGNOSIS — O09512 Supervision of elderly primigravida, second trimester: Secondary | ICD-10-CM

## 2022-05-23 DIAGNOSIS — O219 Vomiting of pregnancy, unspecified: Secondary | ICD-10-CM

## 2022-05-23 DIAGNOSIS — Z7982 Long term (current) use of aspirin: Secondary | ICD-10-CM | POA: Diagnosis not present

## 2022-05-23 DIAGNOSIS — K920 Hematemesis: Secondary | ICD-10-CM | POA: Diagnosis not present

## 2022-05-23 DIAGNOSIS — R112 Nausea with vomiting, unspecified: Secondary | ICD-10-CM

## 2022-05-23 DIAGNOSIS — O99212 Obesity complicating pregnancy, second trimester: Secondary | ICD-10-CM | POA: Insufficient documentation

## 2022-05-23 DIAGNOSIS — O26892 Other specified pregnancy related conditions, second trimester: Secondary | ICD-10-CM | POA: Diagnosis not present

## 2022-05-23 DIAGNOSIS — Z3A17 17 weeks gestation of pregnancy: Secondary | ICD-10-CM | POA: Diagnosis not present

## 2022-05-23 DIAGNOSIS — O10912 Unspecified pre-existing hypertension complicating pregnancy, second trimester: Secondary | ICD-10-CM | POA: Insufficient documentation

## 2022-05-23 DIAGNOSIS — N051 Unspecified nephritic syndrome with focal and segmental glomerular lesions: Secondary | ICD-10-CM

## 2022-05-23 DIAGNOSIS — R011 Cardiac murmur, unspecified: Secondary | ICD-10-CM

## 2022-05-23 DIAGNOSIS — O210 Mild hyperemesis gravidarum: Secondary | ICD-10-CM | POA: Insufficient documentation

## 2022-05-23 LAB — COMPREHENSIVE METABOLIC PANEL
ALT: 24 U/L (ref 0–44)
AST: 24 U/L (ref 15–41)
Albumin: 2.7 g/dL — ABNORMAL LOW (ref 3.5–5.0)
Alkaline Phosphatase: 44 U/L (ref 38–126)
Anion gap: 12 (ref 5–15)
BUN: 9 mg/dL (ref 6–20)
CO2: 21 mmol/L — ABNORMAL LOW (ref 22–32)
Calcium: 9.8 mg/dL (ref 8.9–10.3)
Chloride: 102 mmol/L (ref 98–111)
Creatinine, Ser: 1.16 mg/dL — ABNORMAL HIGH (ref 0.44–1.00)
GFR, Estimated: >60 mL/min (ref >60)
Glucose, Bld: 164 mg/dL — ABNORMAL HIGH (ref 70–99)
Potassium: 3.6 mmol/L (ref 3.5–5.1)
Sodium: 135 mmol/L (ref 135–145)
Total Bilirubin: 0.3 mg/dL (ref 0.3–1.2)
Total Protein: 6.2 g/dL — ABNORMAL LOW (ref 6.5–8.1)

## 2022-05-23 LAB — CBC
HCT: 34.9 % — ABNORMAL LOW (ref 36.0–46.0)
Hemoglobin: 12 g/dL (ref 12.0–15.0)
MCH: 31.4 pg (ref 26.0–34.0)
MCHC: 34.4 g/dL (ref 30.0–36.0)
MCV: 91.4 fL (ref 80.0–100.0)
Platelets: 342 10*3/uL (ref 150–400)
RBC: 3.82 MIL/uL — ABNORMAL LOW (ref 3.87–5.11)
RDW: 14.1 % (ref 11.5–15.5)
WBC: 14.7 10*3/uL — ABNORMAL HIGH (ref 4.0–10.5)
nRBC: 0 % (ref 0.0–0.2)

## 2022-05-23 LAB — URINALYSIS, ROUTINE W REFLEX MICROSCOPIC
Bilirubin Urine: NEGATIVE
Glucose, UA: NEGATIVE mg/dL
Hgb urine dipstick: NEGATIVE
Ketones, ur: 5 mg/dL — AB
Leukocytes,Ua: NEGATIVE
Nitrite: NEGATIVE
Protein, ur: >=300 mg/dL — AB
Specific Gravity, Urine: 1.023 (ref 1.005–1.030)
pH: 5 (ref 5.0–8.0)

## 2022-05-23 LAB — PROTEIN / CREATININE RATIO, URINE
Creatinine, Urine: 304 mg/dL
Protein Creatinine Ratio: 0.57 mg/mg{Cre} — ABNORMAL HIGH (ref 0.00–0.15)
Total Protein, Urine: 174 mg/dL

## 2022-05-23 MED ORDER — ONDANSETRON 4 MG PO TBDP
8.0000 mg | ORAL_TABLET | Freq: Once | ORAL | Status: AC
  Administered 2022-05-23: 8 mg via ORAL
  Filled 2022-05-23: qty 2

## 2022-05-23 MED ORDER — LABETALOL HCL 100 MG PO TABS
100.0000 mg | ORAL_TABLET | Freq: Once | ORAL | Status: AC
  Administered 2022-05-23: 100 mg via ORAL
  Filled 2022-05-23: qty 1

## 2022-05-23 MED ORDER — METOCLOPRAMIDE HCL 10 MG PO TABS: 10.0000 mg | ORAL_TABLET | Freq: Three times a day (TID) | ORAL | 0 refills | Status: DC

## 2022-05-23 MED ORDER — METOCLOPRAMIDE HCL 10 MG PO TABS
10.0000 mg | ORAL_TABLET | Freq: Once | ORAL | Status: AC
  Administered 2022-05-23: 10 mg via ORAL
  Filled 2022-05-23: qty 1

## 2022-05-23 MED ORDER — LACTATED RINGERS IV BOLUS
1000.0000 mL | Freq: Once | INTRAVENOUS | Status: AC
  Administered 2022-05-23: 1000 mL via INTRAVENOUS

## 2022-05-23 MED ORDER — PANTOPRAZOLE SODIUM 40 MG IV SOLR
40.0000 mg | Freq: Once | INTRAVENOUS | Status: AC
  Administered 2022-05-23: 40 mg via INTRAVENOUS
  Filled 2022-05-23: qty 10

## 2022-05-23 NOTE — Discharge Instructions (Signed)
Please go to [facility] where the maternity assessment unit is located to be further evaluated.  You may enter through the front entrance or the ER and ask where the maternity assessment unit is.

## 2022-05-23 NOTE — MAU Note (Signed)
Selena Meyer is a 37 y.o. at [redacted]w[redacted]d here in MAU reporting: has been throwing up a lot. Nausea medicine isn't working.  Talked with OB and primary about options.  Zofran 8 is giving her headaches.  Hadn't thrown up in a couple days, then started again last night. Looked like there was blood in it, hadn't ate or drank anything red, did have some pink gatorade.  Later she was spitting up stuff that was brown, like old blood. Went to UC, they were concerned about her elevated pulse and BP. BP med was changed last week, she did take it this morning.  Head started hurting while she was waiting Onset of complaint: on going Pain score: 6 Vitals:   05/23/22 1811  BP: (!) 149/91  Pulse: (!) 105  Resp: 18  Temp: 98.2 F (36.8 C)  SpO2: 99%     FHT:166 Lab orders placed from triage:  urine

## 2022-05-23 NOTE — ED Provider Notes (Signed)
[facility]    CSN: [id number] Arrival date & time: 05/23/22  1457      History   Chief Complaint Chief Complaint  Patient presents with   Emesis    HPI Selena Meyer is a 37 y.o. female.   Patient presents with nausea and vomiting that started last night.  Patient reports that she has had nausea and vomiting throughout her pregnancy but reports it was well-controlled with over-the-counter medications and ondansetron.  Reports that it seemed to restart last night.  She states that she is currently [redacted] weeks pregnant.  Patient is also concerned given that she noticed some bright red blood in her emesis last night.  Denies diarrhea.  Last bowel movement was yesterday and patient denies blood in stool.  Denies fever, chills, body aches.  Denies any known sick contacts or recent unfavorable foods.  Denies any abdominal pain or any abnormal vaginal bleeding.  Patient reports that she has been able to tolerate Gatorade today.  Patient's blood pressure is elevated and she reports she has been followed by cardiology.  She had medications recently changed and has a follow-up appointment with cardiology in a few days.  Reports that she has been measuring her blood pressure at home and has been 120s to 130s systolic.  She reports that she is not sure if she is able to feel baby move throughout her pregnancy.   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    Hypertension    Irregular menstrual cycle    Irregular periods/menstrual cycles 05/03/2017   Will hold off on Provera or other fertility stuff for now, pt advised pregnancy is not recommended until BP under better control at least, plus other workup pen   Proteinuria 02/28/2013   Uncontrolled hypertension 05/10/2017    Patient Active Problem List    Diagnosis Date Noted   Morbid obesity (HCC) 05/19/2022   Moderate left ventricular hypertrophy 05/19/2022   Kidney disease in pregnancy, unspecified trimester 05/11/2022   Chronic hypertension during pregnancy, antepartum 05/09/2022   AMA (advanced maternal age) primigravida 35+ 05/05/2022   Gestational diabetes mellitus (GDM), antepartum 04/27/2022   Supervision of high risk pregnancy, antepartum 03/30/2022   IDA (iron deficiency anemia) 12/16/2021   Hypokalemia 11/02/2020   SVT (supraventricular tachycardia) 11/01/2020   Stage 2 chronic kidney disease 04/27/2020   Elevated cholesterol with high triglycerides 04/27/2020   Lumbar radiculopathy 09/22/2019   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 No pertinent surgical history.  OB History     Gravida  1   Para      Term      Preterm      AB      Living         SAB      IAB      Ectopic      Multiple      Live Births               Home Medications    Prior to Admission medications   Medication Sig Start Date End Date Taking? Authorizing Provider  Accu-Chek Softclix Lancets lancets 100 each by Other route  4 (four) times daily. Use as instructed 04/28/22   Lesly Dukes, MD  acetaminophen (TYLENOL) 500 MG tablet Take 1,000 mg by mouth every 6 (six) hours as needed for mild pain.     [provider]  albuterol (VENTOLIN HFA) 108 (90 Base) MCG/ACT inhaler Inhale 2 puffs into the lungs every 6 (six) hours as needed for wheezing or shortness of breath. 04/20/21   [name], NP  amLODipine (NORVASC) 10 MG tablet Take 1 tablet by mouth once daily 05/19/22   [name], NP  aspirin EC 81 MG tablet Take 2 tablets (162 mg total) by mouth daily. Swallow whole. 05/09/22   Schaible, Burk, DO  Continuous  Glucose Sensor (FREESTYLE LIBRE 3 SENSOR) MISC Place 1 sensor on the skin every 14 days. Use to check glucose continuously 05/16/22   [name], NP  Cranberry 500 MG CAPS Take 1 capsule by mouth daily.    [provider]  ferrous sulfate 325 (65 FE) MG tablet Take by mouth. 06/02/20   [provider]  glucose blood test strip Use as instructed 04/28/22   Lesly Dukes, MD  ipratropium (ATROVENT) 0.06 % nasal spray Place 2 sprays into both nostrils 4 (four) times daily. As needed for runny nose / postnasal drip 05/26/20   Sunnie Nielsen, DO  labetalol (NORMODYNE) 100 MG tablet Take 1 tablet (100 mg total) by mouth every 8 (eight) hours. 05/19/22   Tobb, Kardie, DO  magnesium oxide (MAG-OX) 400 MG tablet Take 0.5 tablets by mouth daily. 11/05/20   [provider]  metFORMIN (GLUCOPHAGE) 500 MG tablet Take 1 tablet (500 mg total) by mouth 2 (two) times daily with a meal. 05/04/22   Lennart Pall, MD  Omega-3 Fatty Acids (FISH OIL) 1000 MG CAPS Take by mouth.    [provider]  ondansetron (ZOFRAN) 8 MG tablet Take 1 tablet (8 mg total) by mouth every 8 (eight) hours as needed for nausea or vomiting. 05/17/22   Lennart Pall, MD  Prenatal Vit-Fe Fumarate-FA (MULTIVITAMIN-PRENATAL) 27-0.8 MG TABS tablet Take 1 tablet by mouth daily at 12 noon.    [provider]    Family History Family History  Problem Relation Age of Onset   Diabetes Mother    High blood pressure Mother    Renal Disease Mother    Diabetes Sister    Cancer Maternal Grandmother    Diabetes Maternal Grandmother    High blood pressure Maternal Grandmother    Cancer Maternal Aunt        breast   Other Father     Social History Social History   Tobacco Use   Smoking status: Former    Packs/day: 0.25    Years: 5.00    Additional pack years: 0.00    Total pack years: 1.25    Types: Cigarettes    Quit date: 05/16/2021    Years since quitting: 1.0   Smokeless tobacco:  Never   Tobacco comments:    Quit smoking June 11, 2023after her mom passed away  Vaping Use   Vaping Use: Never used  Substance Use Topics   Alcohol use: Yes    Comment: "occasional use; social drinker"   Drug use: No     Allergies   Ibuprofen   Review of Systems Review of Systems Per HPI  Physical Exam Triage Vital Signs ED Triage Vitals [05/23/22 1542]  Enc Vitals Group     BP (!) 162/110     Pulse  Rate (!) 105     Resp 18     Temp 98.9 F (37.2 C)     Temp Source Oral     SpO2 98 %     Weight      Height      Head Circumference      Peak Flow      Pain Score      Pain Loc      Pain Edu?      Excl. in GC?    No data found.  Updated Vital Signs BP (!) 162/110 (BP Location: Left Arm)   Pulse (!) 105   Temp 98.9 F (37.2 C) (Oral)   Resp 18   LMP 01/18/2022   SpO2 98%   Visual Acuity Right Eye Distance:   Left Eye Distance:   Bilateral Distance:    Right Eye Near:   Left Eye Near:    Bilateral Near:     Physical Exam Constitutional:      General: She is not in acute distress.    Appearance: Normal appearance. She is not toxic-appearing or diaphoretic.  HENT:     Head: Normocephalic and atraumatic.     Mouth/Throat:     Mouth: Mucous membranes are moist.     Pharynx: No posterior oropharyngeal erythema.  Eyes:     Extraocular Movements: Extraocular movements intact.     Conjunctiva/sclera: Conjunctivae normal.  Cardiovascular:     Rate and Rhythm: Normal rate and regular rhythm.     Pulses: Normal pulses.     Heart sounds: Normal heart sounds.  Pulmonary:     Effort: Pulmonary effort is normal. No respiratory distress.     Breath sounds: Normal breath sounds.  Abdominal:     General: Bowel sounds are normal. There is no distension.     Palpations: Abdomen is soft.     Tenderness: There is no abdominal tenderness.  Neurological:     General: No focal deficit present.     Mental Status: She is alert and oriented to person, place, and time.  Mental status is at baseline.  Psychiatric:        Mood and Affect: Mood normal.        Behavior: Behavior normal.        Thought Content: Thought content normal.        Judgment: Judgment normal.      UC Treatments / Results  Labs (all labs ordered are listed, but only abnormal results are displayed) Labs Reviewed - No data to display  EKG   Radiology No results found.  Procedures Procedures (including critical care time)  Medications Ordered in UC Medications - No data to display  Initial Impression / Assessment and Plan / UC Course  I have reviewed the triage vital signs and the nursing notes.  Pertinent labs & imaging results that were available during my care of the patient were reviewed by me and considered in my medical decision making (see chart for details).     I am very concerned the patient needs a higher level of care and management given hematemesis and tachycardia especially during pregnancy.  Therefore, patient was advised to go to the maternity assessment unit at [facility]  ER today for further evaluation and management.  She was agreeable with this plan.  Vital signs stable at discharge.  Agree with patient self transport to the ER. Final Clinical Impressions(s) / UC Diagnoses   Final diagnoses:  Nausea and vomiting, unspecified vomiting  type  Hematemesis with nausea     Discharge Instructions      Please go to [facility] where the maternity assessment unit is located to be further evaluated.  You may enter through the front entrance or the ER and ask where the maternity assessment unit is.    ED Prescriptions   None    PDMP not reviewed this encounter.   Gustavus Bryant, [place] 05/23/22 [phone]

## 2022-05-23 NOTE — MAU Provider Note (Signed)
History     CSN: [id number]  Arrival date and time: 05/23/22 1714   Chief Complaint  Patient presents with   Headache   Hypertension   Hematemesis   Headache  Pertinent negatives include no abdominal pain, back pain, coughing, dizziness, eye pain, fever, nausea, sore throat or vomiting. Her past medical history is significant for hypertension.  Hypertension Associated symptoms include headaches. Pertinent negatives include no chest pain or shortness of breath.   Patient is 37 y.o. G1P0 [redacted]w[redacted]d here with complaints of nausea and vomiting with headache. Reports she was seen at urgent care but they thought she was dehydrated. Patient reports 5d without vomiting and then today had two bout of vomiting with 5 times each time -- a total of 10 emesis events. She noticed some bloody mucous in her vomits. She reports she is tired. Report she has been peeing normally.  She was unable to take her medications like normal due to her vomiting. She has not eaten anything either.  She has not taken her midday labetalol.   Denies sick contacts. Report more N/V now in 2nd trimester.   denies LOF, VB, contractions, vaginal discharge.   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    Hypertension    Irregular menstrual cycle    Irregular periods/menstrual cycles 05/03/2017   Will hold off on Provera or other fertility stuff for now, pt advised pregnancy is not recommended until BP under better control at least, plus other workup pen   Proteinuria 02/28/2013   Uncontrolled hypertension 05/10/2017    History reviewed. No pertinent surgical  history.  Family History  Problem Relation Age of Onset   Diabetes Mother    High blood pressure Mother    Renal Disease Mother    Diabetes Sister    Cancer Maternal Grandmother    Diabetes Maternal Grandmother    High blood pressure Maternal Grandmother    Cancer Maternal Aunt        breast   Other Father     Social History   Tobacco Use   Smoking status: Former    Packs/day: 0.25    Years: 5.00    Additional pack years: 0.00    Total pack years: 1.25    Types: Cigarettes    Quit date: 05/16/2021    Years since quitting: 1.0   Smokeless tobacco: Never   Tobacco comments:    Quit smoking 06/07/2023after her mom passed away  Vaping Use   Vaping Use: Never used  Substance Use Topics   Alcohol use: Yes    Comment: "occasional use; social drinker"   Drug use: No    Allergies:  Allergies  Allergen Reactions   Ibuprofen Hives    Medications Prior to Admission  Medication Sig Dispense Refill Last Dose   amLODipine (NORVASC) 10 MG tablet Take 1 tablet by mouth  once daily 90 tablet 0 05/23/2022   aspirin EC 81 MG tablet Take 2 tablets (162 mg total) by mouth daily. Swallow whole. 30 tablet 12 05/23/2022   Cranberry 500 MG CAPS Take 1 capsule by mouth daily.   05/22/2022   ferrous sulfate 325 (65 FE) MG tablet Take by mouth.   05/23/2022   labetalol (NORMODYNE) 100 MG tablet Take 1 tablet (100 mg total) by mouth every 8 (eight) hours. 270 tablet 3 05/23/2022   magnesium oxide (MAG-OX) 400 MG tablet Take 0.5 tablets by mouth daily.   05/22/2022   metFORMIN (GLUCOPHAGE) 500 MG tablet Take 1 tablet (500 mg total) by mouth 2 (two) times daily with a meal. 60 tablet 5 05/23/2022   Omega-3 Fatty Acids (FISH OIL) 1000 MG CAPS Take by mouth.   05/22/2022   ondansetron (ZOFRAN) 8 MG tablet Take 1 tablet (8 mg total) by mouth every 8 (eight) hours as needed for nausea or vomiting. 30 tablet 1 05/23/2022   Prenatal Vit-Fe Fumarate-FA (MULTIVITAMIN-PRENATAL) 27-0.8 MG TABS tablet Take 1 tablet  by mouth daily at 12 noon.   05/22/2022   Accu-Chek Softclix Lancets lancets 100 each by Other route 4 (four) times daily. Use as instructed 100 each 12    acetaminophen (TYLENOL) 500 MG tablet Take 1,000 mg by mouth every 6 (six) hours as needed for mild pain.       albuterol (VENTOLIN HFA) 108 (90 Base) MCG/ACT inhaler Inhale 2 puffs into the lungs every 6 (six) hours as needed for wheezing or shortness of breath. 1 each 1    Continuous Glucose Sensor (FREESTYLE LIBRE 3 SENSOR) MISC Place 1 sensor on the skin every 14 days. Use to check glucose continuously 6 each 1    glucose blood test strip Use as instructed 100 each 12    ipratropium (ATROVENT) 0.06 % nasal spray Place 2 sprays into both nostrils 4 (four) times daily. As needed for runny nose / postnasal drip 15 mL 1     Review of Systems  Constitutional:  Negative for chills and fever.  HENT:  Negative for congestion and sore throat.   Eyes:  Negative for pain and visual disturbance.  Respiratory:  Negative for cough, chest tightness and shortness of breath.   Cardiovascular:  Negative for chest pain.  Gastrointestinal:  Negative for abdominal pain, diarrhea, nausea and vomiting.  Endocrine: Negative for cold intolerance and heat intolerance.  Genitourinary:  Negative for dysuria and flank pain.  Musculoskeletal:  Negative for back pain.  Skin:  Negative for rash.  Allergic/Immunologic: Negative for food allergies.  Neurological:  Positive for headaches. Negative for dizziness and light-headedness.  Psychiatric/Behavioral:  Negative for agitation.    Physical Exam   Blood pressure 137/82, pulse 95, temperature 98.2 F (36.8 C), temperature source Oral, resp. rate 18, height 5\' 5"  (1.651 m), weight 88.5 kg, last menstrual period 01/18/2022, SpO2 99 %.  Patient Vitals for the past 24 hrs:  BP Temp Temp src Pulse Resp SpO2 Height Weight  05/23/22 2001 137/82 -- -- 95 -- -- -- --  05/23/22 1846 (!) 160/93 -- -- (!) 101 -- -- -- --   05/23/22 1811 (!) 149/91 98.2 F (36.8 C) Oral (!) 105 18 99 % 5\' 5"  (1.651 m) 88.5 kg    Physical Exam Vitals and nursing note reviewed.  Constitutional:      General: She is not in acute distress.    Appearance: She is well-developed.     Comments: Pregnant female  HENT:     Head: Normocephalic and atraumatic.  Eyes:     General: No scleral icterus.    Conjunctiva/sclera: Conjunctivae normal.  Cardiovascular:     Rate and Rhythm: Normal rate.  Pulmonary:     Effort: Pulmonary effort is normal.  Chest:     Chest wall: No tenderness.  Abdominal:     Palpations: Abdomen is soft.     Tenderness: There is no abdominal tenderness. There is no guarding or rebound.     Comments: Gravid  Genitourinary:    Vagina: Normal.  Musculoskeletal:        General: Normal range of motion.     Cervical back: Normal range of motion and neck supple.  Skin:    General: Skin is warm and dry.     Findings: No rash.  Neurological:     Mental Status: She is alert and oriented to person, place, and time.   FHR=166  MAU Course  Procedures  MDM: Moderate  This patient presents to the ED for concern of   Chief Complaint  Patient presents with   Headache   Hypertension   Hematemesis     These complains involves an extensive number of treatment options, and is a complaint that carries with it a high risk of complications and morbidity.  The differential diagnosis for  1. Headache INCLUDES chronic HTN without ability to tolerate medication, PEC, tension HA  2. Hematemesis INCLUDES abrasion, mallory weis tear.   Co morbidities that complicate the patient evaluation: Patient Active Problem List   Diagnosis Date Noted   Morbid obesity (HCC) 05/19/2022   Moderate left ventricular hypertrophy 05/19/2022   Kidney disease in pregnancy, unspecified trimester 05/11/2022   Chronic hypertension during pregnancy, antepartum 05/09/2022   AMA (advanced maternal age) primigravida 35+ 05/05/2022    Gestational diabetes mellitus (GDM), antepartum 04/27/2022   Supervision of high risk pregnancy, antepartum 03/30/2022   IDA (iron deficiency anemia) 12/16/2021   Hypokalemia 11/02/2020   SVT (supraventricular tachycardia) 11/01/2020   Stage 2 chronic kidney disease 04/27/2020   Elevated cholesterol with high triglycerides 04/27/2020   Lumbar radiculopathy 09/22/2019   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  The pertinent results include:   Results for orders placed or performed during the hospital encounter of 05/23/22 (from the past 24 hour(s))  Urinalysis, Routine w reflex microscopic -Urine, Clean Catch     Status: Abnormal   Collection Time: 05/23/22  6:17 PM  Result Value Ref Range   Color, Urine YELLOW YELLOW   APPearance CLOUDY (A) CLEAR   Specific Gravity, Urine 1.023 1.005 - 1.030   pH 5.0 5.0 - 8.0   Glucose, UA NEGATIVE NEGATIVE mg/dL   Hgb urine dipstick NEGATIVE NEGATIVE   Bilirubin Urine NEGATIVE NEGATIVE   Ketones, ur 5 (A) NEGATIVE mg/dL   Protein, ur >=161 (A) NEGATIVE mg/dL   Nitrite NEGATIVE NEGATIVE   Leukocytes,Ua NEGATIVE NEGATIVE   RBC / HPF 0-5 0 - 5 RBC/hpf   WBC, UA 0-5 0 - 5 WBC/hpf   Bacteria, UA RARE (A) NONE SEEN   Squamous Epithelial / HPF 0-5 0 -  5 /HPF   Mucus PRESENT    Hyaline Casts, UA PRESENT    Ca Oxalate Crys, UA PRESENT   Protein / creatinine ratio, urine     Status: Abnormal   Collection Time: 05/23/22  6:17 PM  Result Value Ref Range   Creatinine, Urine 304 mg/dL   Total Protein, Urine 174 mg/dL    Protein Creatinine Ratio 0.57 (H) 0.00 - 0.15 mg/mg[Cre]  CBC     Status: Abnormal   Collection Time: 05/23/22  6:55 PM  Result Value Ref Range   WBC 14.7 (H) 4.0 - 10.5 K/uL   RBC 3.82 (L) 3.87 - 5.11 MIL/uL   Hemoglobin 12.0 12.0 - 15.0 g/dL   HCT 16.1 (L) 09.6 - 04.5 %   MCV 91.4 80.0 - 100.0 fL   MCH 31.4 26.0 - 34.0 pg   MCHC 34.4 30.0 - 36.0 g/dL   RDW 40.9 81.1 - 91.4 %   Platelets 342 150 - 400 K/uL   nRBC 0.0 0.0 - 0.2 %  Comprehensive metabolic panel     Status: Abnormal   Collection Time: 05/23/22  6:55 PM  Result Value Ref Range   Sodium 135 135 - 145 mmol/L   Potassium 3.6 3.5 - 5.1 mmol/L   Chloride 102 98 - 111 mmol/L   CO2 21 (L) 22 - 32 mmol/L   Glucose, Bld 164 (H) 70 - 99 mg/dL   BUN 9 6 - 20 mg/dL   Creatinine, Ser 7.82 (H) 0.44 - 1.00 mg/dL   Calcium 9.8 8.9 - 95.6 mg/dL   Total Protein 6.2 (L) 6.5 - 8.1 g/dL   Albumin 2.7 (L) 3.5 - 5.0 g/dL   AST 24 15 - 41 U/L   ALT 24 0 - 44 U/L   Alkaline Phosphatase 44 38 - 126 U/L   Total Bilirubin 0.3 0.3 - 1.2 mg/dL   GFR, Estimated >21 >30 mL/min   Anion gap 12 5 - 15     Medicines ordered and prescription drug management:  Medications:  Zofran, reglan, protonix, LR bolus,    Reevaluation of the patient after these medicines showed that the patient improved I have reviewed the patients home medicines and have made adjustments as needed  Critical Interventions: IV fluids  MAU Course: 10:18 PM Patient is feeling better after IV fluids, medication. Tolerating water and wants to PO challenge with graham crackers. Denies HA  After the interventions noted above, I reevaluated the patient and found that they have :improved  Dispostion: discharged   Assessment and Plan   1. Nausea/vomiting in pregnancy   2. [redacted] weeks gestation of pregnancy   3. Chronic hypertension during pregnancy, antepartum   4. Primigravida of advanced maternal age in second trimester   5. FSGS (focal segmental glomerulosclerosis)    6. Morbid obesity (HCC)   7. Systolic murmur    - Discharged home - Rx for reglan given  - Consider outpatient PPI vs H2 blocker if sx persist - Return precautions discussed - Follow up at scheduled appointments below  Future Appointments  Date Time Provider Department Center  05/26/2022  1:20 PM Tobb, Kardie, DO [facility] None  05/29/2022  8:10 AM Lesly Dukes, MD [facility]  05/31/2022  8:45 AM [facility] NURSE [facility]  05/31/2022  9:00 AM [facility] US1 [facility]  06/13/2022 11:15 AM [facility]  06/23/2022 10:30 AM [facility] None  06/23/2022 10:45 AM Erenest Blank, NP [facility] None    Federico Flake  05/23/2022, 10:17 PM

## 2022-05-23 NOTE — ED Triage Notes (Signed)
Pt is [redacted] weeks pregnant reports 1-2 episode of vomiting that started last night. Pt was given Zofran to use when she has vomiting.

## 2022-05-26 ENCOUNTER — Ambulatory Visit (INDEPENDENT_AMBULATORY_CARE_PROVIDER_SITE_OTHER): Payer: BC Managed Care – PPO | Admitting: Cardiology

## 2022-05-26 ENCOUNTER — Encounter: Payer: Self-pay | Admitting: Cardiology

## 2022-05-26 VITALS — BP 152/88 | HR 109 | Ht 65.0 in | Wt 195.0 lb

## 2022-05-26 DIAGNOSIS — Z3A18 18 weeks gestation of pregnancy: Secondary | ICD-10-CM

## 2022-05-26 DIAGNOSIS — I471 Supraventricular tachycardia, unspecified: Secondary | ICD-10-CM | POA: Diagnosis not present

## 2022-05-26 DIAGNOSIS — R899 Unspecified abnormal finding in specimens from other organs, systems and tissues: Secondary | ICD-10-CM | POA: Diagnosis not present

## 2022-05-26 DIAGNOSIS — O10919 Unspecified pre-existing hypertension complicating pregnancy, unspecified trimester: Secondary | ICD-10-CM

## 2022-05-26 MED ORDER — LABETALOL HCL 200 MG PO TABS: 200.0000 mg | ORAL_TABLET | Freq: Three times a day (TID) | ORAL | 3 refills | Status: DC

## 2022-05-26 NOTE — Progress Notes (Signed)
Cardio-Obstetrics Clinic  New Evaluation  Date:  05/26/2022   ID:  [name], DO B [birth year], MRN [id number]  PCP:  [name], NP   [facility] Providers Cardiologist:  Thomasene Ripple, DO  Electrophysiologist:  None       Referring MD: [name], NP   Chief Complaint: " I am ok"  History of Present Illness:    Selena Meyer is a 37 y.o. female [G1P0] who is being seen today for the evaluation of chronic hypertension  at the request of [name], NP.   Medical history includes SVT, Hypertension, hypertriglyceridemia, follows with cardiology and [facility].  She was referred to the cardiology clinic by her OB/GYN team.  Currently she is on amlodipine and metoprolol.  Per notes she was also on hydrochlorothiazide 25 mg daily not sure when this was taken off.  I saw the patient on 05/19/2022 at that time her blood pressure was elevated - stopped the lopressor and started the patient on Labetalol.    Currently [redacted] weeks pregnant. She recently visited the MAU due to vomiting.   NO other complaitnn   Prior CV Studies Reviewed: The following studies were reviewed today:  TTE 2019 Study Conclusions   - Left ventricle: The cavity size was normal. Wall thickness was    increased in a pattern of moderate LVH. Systolic function was    normal. Wall motion was normal; there were no regional wall    motion abnormalities. Doppler parameters are consistent with    abnormal left ventricular relaxation (grade 1 diastolic    dysfunction).   -------------------------------------------------------------------  Study data:  No prior study was available for comparison.  Study  status:  Routine.  Procedure:  Transthoracic echocardiography.  Image quality was adequate.  Study completion:  There were no  complications.          Transthoracic echocardiography.  M-mode,  complete 2D, spectral Doppler, and color Doppler.  Birthdate:  Patient birthdate: [birth year].  Age:   Patient is 37 yr old.  Sex:  Gender: female.    BMI: 32.7 kg/m^2.  Blood pressure:     140/101  Patient status:  Outpatient.  Study date:  Study date: 08/01/2017.  Study time: 10:14 AM.  Location:  Echo laboratory.   -------------------------------------------------------------------   -------------------------------------------------------------------  Left ventricle:  The cavity size was normal. Wall thickness was  increased in a pattern of moderate LVH. Systolic function was  normal. Wall motion was normal; there were no regional wall motion  abnormalities. Doppler parameters are consistent with abnormal left  ventricular relaxation (grade 1 diastolic dysfunction).   -------------------------------------------------------------------  Aortic valve:   Structurally normal valve. Trileaflet; normal  thickness leaflets. Cusp separation was normal. Mobility was not  restricted.  Doppler:  Transvalvular velocity was within the normal  range. There was no stenosis. There was no regurgitation.   -------------------------------------------------------------------  Aorta: The aorta was normal, not dilated, and non-diseased. Aortic  root: The aortic root was normal in size.   -------------------------------------------------------------------  Mitral valve:   Structurally normal valve.   Leaflet separation was  normal. Mobility was not restricted.  Doppler:  Transvalvular  velocity was within the normal range. There was no evidence for  stenosis. There was no regurgitation.    Peak gradient (D): 2 mm  Hg.   -------------------------------------------------------------------  Left atrium:  The atrium was normal in size.   -------------------------------------------------------------------  Atrial septum:  The septum was normal.   -------------------------------------------------------------------  Pulmonary veins:  Common  pulmonary vein:  The Doppler velocity and flow profile were   normal.   -------------------------------------------------------------------  Right ventricle:  The cavity size was normal. Wall thickness was  normal. Systolic function was normal.   -------------------------------------------------------------------  Pulmonic valve:    Structurally normal valve.   Cusp separation was  normal.  Doppler:  Transvalvular velocity was within the normal  range. There was no evidence for stenosis. There was no  regurgitation.   -------------------------------------------------------------------  Tricuspid valve:   Structurally normal valve.   Leaflet separation  was normal.  Doppler:  Transvalvular velocity was within the normal  range. There was no regurgitation.   -------------------------------------------------------------------  Pulmonary artery:   The main pulmonary artery was normal-sized.  Systolic pressure was within the normal range.   -------------------------------------------------------------------  Right atrium:  The atrium was normal in size.   -------------------------------------------------------------------  Pericardium: The pericardium was normal in appearance. There was  no pericardial effusion.   -------------------------------------------------------------------  Systemic veins:  Inferior vena cava: The vessel was normal in size. The  respirophasic diameter changes were in the normal range (>= 50%),  consistent with normal central venous pressure.   -------------------------------------------------------------------  Post procedure conclusions  Ascending Aorta:   - The aorta was normal, not dilated, and non-diseased.   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 q6 mos Dr Estill Bakes -. 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    Hypertension    Irregular menstrual cycle    Irregular periods/menstrual  cycles 05/03/2017   Will hold off on Provera or other fertility stuff for now, pt advised pregnancy is not recommended until BP under better control at least, plus other workup pen   Proteinuria 02/28/2013   Uncontrolled hypertension 05/10/2017    No past surgical history on file.    OB History     Gravida  1   Para      Term      Preterm      AB      Living         SAB      IAB      Ectopic      Multiple      Live Births                  Current Medications: Current Meds  Medication Sig   Accu-Chek Softclix Lancets lancets 100 each by Other route 4 (four) times daily. Use as instructed   acetaminophen (TYLENOL) 500 MG tablet Take 1,000 mg by mouth every 6 (six) hours as needed for mild pain.    albuterol (VENTOLIN HFA) 108 (90 Base) MCG/ACT inhaler Inhale 2 puffs into the lungs every 6 (six) hours as needed for wheezing or shortness of breath.   amLODipine (NORVASC) 10 MG tablet Take 1 tablet by mouth once daily   aspirin EC 81 MG tablet Take 2 tablets (162 mg total) by mouth daily. Swallow whole.   Continuous Glucose Sensor (FREESTYLE LIBRE 3 SENSOR) MISC Place 1 sensor on the skin every 14 days. Use to check glucose continuously   Cranberry 500 MG CAPS Take 1 capsule by mouth daily.   ferrous sulfate 325 (65 FE) MG tablet Take by mouth.   glucose blood test strip Use as instructed   ipratropium (ATROVENT) 0.06 % nasal spray Place 2 sprays into both nostrils 4 (four) times daily. As needed for runny nose / postnasal drip  labetalol (NORMODYNE) 200 MG tablet Take 1 tablet (200 mg total) by mouth every 8 (eight) hours.   magnesium oxide (MAG-OX) 400 MG tablet Take 0.5 tablets by mouth daily.   metFORMIN (GLUCOPHAGE) 500 MG tablet Take 1 tablet (500 mg total) by mouth 2 (two) times daily with a meal.   metoCLOPramide (REGLAN) 10 MG tablet Take 1 tablet (10 mg total) by mouth 3 (three) times daily before meals.   Omega-3 Fatty Acids (FISH OIL) 1000 MG CAPS Take  by mouth.   ondansetron (ZOFRAN) 8 MG tablet Take 1 tablet (8 mg total) by mouth every 8 (eight) hours as needed for nausea or vomiting.   Prenatal Vit-Fe Fumarate-FA (MULTIVITAMIN-PRENATAL) 27-0.8 MG TABS tablet Take 1 tablet by mouth daily at 12 noon.   [DISCONTINUED] labetalol (NORMODYNE) 100 MG tablet Take 1 tablet (100 mg total) by mouth every 8 (eight) hours.     Allergies:   Ibuprofen   Social History   Socioeconomic History   Marital status: Married    Spouse name: Ivar Drape   Number of children: 0   Years of education: Not on file   Highest education level: Not on file  Occupational History    Employer: UNEMPLOYED  Tobacco Use   Smoking status: Former    Packs/day: 0.25    Years: 5.00    Additional pack years: 0.00    Total pack years: 1.25    Types: Cigarettes    Quit date: 05/16/2021    Years since quitting: 1.0   Smokeless tobacco: Never   Tobacco comments:    Quit smoking 06/01/23after her mom passed away  Vaping Use   Vaping Use: Never used  Substance and Sexual Activity   Alcohol use: Yes    Comment: "occasional use; social drinker"   Drug use: No   Sexual activity: Yes    Partners: Male    Birth control/protection: None  Other Topics Concern   Not on file  Social History Narrative   Not on file   Social Determinants of Health   Financial Resource Strain: Not on file  Food Insecurity: Not on file  Transportation Needs: Not on file  Physical Activity: Not on file  Stress: Not on file  Social Connections: Not on file      Family History  Problem Relation Age of Onset   Diabetes Mother    High blood pressure Mother    Renal Disease Mother    Diabetes Sister    Cancer Maternal Grandmother    Diabetes Maternal Grandmother    High blood pressure Maternal Grandmother    Cancer Maternal Aunt        breast   Other Father       ROS:   Please see the history of present illness.     All other systems reviewed and are negative.   Labs/EKG  Reviewed:    EKG:   EKG is was not ordered today.    Recent Labs: 05/23/2022: ALT 24; BUN 9; Creatinine, Ser 1.16; Hemoglobin 12.0; Platelets 342; Potassium 3.6; Sodium 135   Recent Lipid Panel Lab Results  Component Value Date/Time   CHOL 240 (H) 11/15/2021 12:00 AM   TRIG 492 (H) 11/15/2021 12:00 AM   HDL 40 (L) 11/15/2021 12:00 AM   CHOLHDL 6.0 (H) 11/15/2021 12:00 AM   LDLCALC  11/15/2021 12:00 AM     Comment:     . LDL cholesterol not calculated. Triglyceride levels greater than 400 mg/dL invalidate calculated LDL  results. . Reference range: <100 . Desirable range <100 mg/dL for primary prevention;   <70 mg/dL for patients with CHD or diabetic patients  with > or = 2 CHD risk factors. [name] LDL-C is now calculated using the Martin-Hopkins  calculation, which is a validated novel method providing  better accuracy than the Friedewald equation in the  estimation of LDL-C.  Horald Pollen et al. Lenox Ahr. 1610;960(45): 2061-2068  (http://education.[employer].com/faq/FAQ164)     Physical Exam:    VS:  BP (!) 152/88 (BP Location: Left Arm, Patient Position: Sitting)   Pulse (!) 109   Ht 5\' 5"  (1.651 m)   Wt 195 lb (88.5 kg)   LMP 01/18/2022   SpO2 99%   BMI 32.45 kg/m     Wt Readings from Last 3 Encounters:  05/26/22 195 lb (88.5 kg)  05/23/22 195 lb (88.5 kg)  05/19/22 192 lb 3.2 oz (87.2 kg)     GEN:  Well nourished, well developed in no acute distress HEENT: Normal NECK: No JVD; No carotid bruits LYMPHATICS: No lymphadenopathy CARDIAC: RRR, no murmurs, rubs, gallops RESPIRATORY:  Clear to auscultation without rales, wheezing or rhonchi  ABDOMEN: Soft, non-tender, non-distended MUSCULOSKELETAL:  No edema; No deformity  SKIN: Warm and dry NEUROLOGIC:  Alert and oriented x 3 PSYCHIATRIC:  Normal affect    Risk Assessment/Risk Calculators:                  ASSESSMENT & PLAN:    Chronic hypertension in pregnancy  Obesity  SVT Advanced maternal  age  She is hypertensive in the office today.  Increased her Labetalol to 200 mg every 8 hr. She will get CMP . Continue Amlodipine is fine She will see our Cardio-Ob pharmacist for medication titration in 1 week.  I will see her in 4 weeks.   The patient understands the need to lose weight with diet and exercise. We have discussed specific strategies for this.  Patient Instructions  Medication Instructions:  Your physician has recommended you make the following change in your medication:  INCREASE: Labetalol 200 mg every 8 hours *If you need a refill on your cardiac medications before your next appointment, please call your pharmacy*   Lab Work: Your physician recommends that you have labs drawn today: CMP If you have labs (blood work) drawn today and your tests are completely normal, you will receive your results only by: MyChart Message (if you have MyChart) OR A paper copy in the mail If you have any lab test that is abnormal or we need to change your treatment, we will call you to review the results.    Testing/Procedures: None   Follow-Up: At [facility], you and your health needs are our priority.  As part of our continuing mission to provide you with exceptional heart care, we have created designated Provider Care Teams.  These Care Teams include your primary Cardiologist (physician) and Advanced Practice Providers (APPs -  Physician Assistants and Nurse Practitioners) who all work together to provide you with the care you need, when you need it.   Your next appointment:   4 week(s)  Provider:   Thomasene Ripple, DO     Other Instructions Please see our pharmacist in 1 weeks.  [address], [place], [place] [address]   Dispo:  No follow-ups on file.   Medication Adjustments/Labs and Tests Ordered: Current medicines are reviewed at length with the patient today.  Concerns regarding medicines are outlined above.  Tests Ordered: Orders  Placed This  Encounter  Procedures   Comprehensive metabolic panel   AMB Referral to Heartcare Pharm-D   Medication Changes: Meds ordered this encounter  Medications   labetalol (NORMODYNE) 200 MG tablet    Sig: Take 1 tablet (200 mg total) by mouth every 8 (eight) hours.    Dispense:  270 tablet    Refill:  3

## 2022-05-26 NOTE — Patient Instructions (Addendum)
Medication Instructions:  Your physician has recommended you make the following change in your medication:  INCREASE: Labetalol 200 mg every 8 hours *If you need a refill on your cardiac medications before your next appointment, please call your pharmacy*   Lab Work: Your physician recommends that you have labs drawn today: CMP If you have labs (blood work) drawn today and your tests are completely normal, you will receive your results only by: MyChart Message (if you have MyChart) OR A paper copy in the mail If you have any lab test that is abnormal or we need to change your treatment, we will call you to review the results.    Testing/Procedures: None   Follow-Up: At [facility], you and your health needs are our priority.  As part of our continuing mission to provide you with exceptional heart care, we have created designated Provider Care Teams.  These Care Teams include your primary Cardiologist (physician) and Advanced Practice Providers (APPs -  Physician Assistants and Nurse Practitioners) who all work together to provide you with the care you need, when you need it.   Your next appointment:   4 week(s)  Provider:   Thomasene Ripple, DO     Other Instructions Please see our pharmacist in 1 weeks.  [address] [place], [place], [place] [address]

## 2022-05-29 ENCOUNTER — Ambulatory Visit (INDEPENDENT_AMBULATORY_CARE_PROVIDER_SITE_OTHER): Payer: BC Managed Care – PPO | Admitting: Obstetrics & Gynecology

## 2022-05-29 VITALS — BP 142/80 | HR 90 | Wt 194.0 lb

## 2022-05-29 DIAGNOSIS — O24415 Gestational diabetes mellitus in pregnancy, controlled by oral hypoglycemic drugs: Secondary | ICD-10-CM

## 2022-05-29 DIAGNOSIS — N051 Unspecified nephritic syndrome with focal and segmental glomerular lesions: Secondary | ICD-10-CM

## 2022-05-29 DIAGNOSIS — O0990 Supervision of high risk pregnancy, unspecified, unspecified trimester: Secondary | ICD-10-CM | POA: Diagnosis not present

## 2022-05-29 DIAGNOSIS — O10919 Unspecified pre-existing hypertension complicating pregnancy, unspecified trimester: Secondary | ICD-10-CM

## 2022-05-29 MED ORDER — METFORMIN HCL 500 MG PO TABS: ORAL_TABLET | ORAL | 5 refills | Status: DC

## 2022-05-29 NOTE — Progress Notes (Signed)
PRENATAL VISIT NOTE  Subjective:  Selena Meyer is a 37 y.o. G1P0 at [redacted]w[redacted]d being seen today for ongoing prenatal care.  She is currently monitored for the following issues for this high-risk pregnancy and has Tobacco abuse; Proteinuria; Systolic murmur; Episodic tension-type headache, not intractable; Polycystic ovaries; FSGS (focal segmental glomerulosclerosis); Lumbar radiculopathy; Stage 2 chronic kidney disease; Elevated cholesterol with high triglycerides; Hypokalemia; SVT (supraventricular tachycardia); IDA (iron deficiency anemia); Supervision of high risk pregnancy, antepartum; Gestational diabetes mellitus (GDM), antepartum; AMA (advanced maternal age) primigravida 72+; Chronic hypertension during pregnancy, antepartum; Kidney disease in pregnancy, unspecified trimester; Morbid obesity (HCC); and Moderate left ventricular hypertrophy on their problem list.  Patient reports  vomiting better .  Contractions: Not present. Vag. Bleeding: None.  Movement: Present. Denies leaking of fluid.   The following portions of the patient's history were reviewed and updated as appropriate: allergies, current medications, past family history, past medical history, past social history, past surgical history and problem list.   Objective:   Vitals:   05/29/22 0814  BP: (!) 145/82  Pulse: (!) 101  Weight: 194 lb (88 kg)    Fetal Status: Fetal Heart Rate (bpm): 156   Movement: Present     General:  Alert, oriented and cooperative. Patient is in no acute distress.  Skin: Skin is warm and dry. No rash noted.   Cardiovascular: Normal heart rate noted  Respiratory: Normal respiratory effort, no problems with respiration noted  Abdomen: Soft, gravid, appropriate for gestational age.  Pain/Pressure: Absent     Pelvic: Cervical exam deferred        Extremities: Normal range of motion.  Edema: None  Mental Status: Normal mood and affect. Normal behavior. Normal judgment and thought content.   Assessment  and Plan:  Pregnancy: G1P0 at [redacted]w[redacted]d 1. Supervision of high risk pregnancy, antepartum Basic anatomy ultrasound shows possible signs of an open neural tube defect due to the presence of a banana-shaped cerebellum in addition to borderline ventriculomegaly.  However, the fetal spine appears normal but is limited due to fetal position. -Detailed [place] to be done around 19 weeks -Baseline preeclampsia labs: CMP, CBC, and urine protein/creatinine have been completed.  -Ok to switch Norvasc and Metoprolol to Labetalol and Procardia per cardiology suggestion. This will allow better blood pressure control while also  -Start Aspirin 162 mg for preeclampsia prophylasis -Follow-up anatomy and fetal growth in 4 to 6 weeks -Serial growth ultrasounds starting around 28 weeks to monitor for fetal growth restriction -Fetal echo should be arranged around 21-23 weeks due to early onset gestational diabetes in pregnancy  -Antenatal testing to start around 32 weeks due to the increased risk of stillbirth and high risk pregnancy -Delivery timing pending clinical course  -Continue routine prenatal care with referring OB provider  2.  CHTN:  home BPs are mostly 130s/80s.  Higher at [facility]. Dr. Servando Salina had [employer] bring and cuff and it is working correctly.  Followed by Dr. Servando Salina.   3.  GDM:  elevated postprandials, especially lunch and dinner; Will increase metformin to 1000 mg in the am and continue 500 mg at night (fastings are in the 80s)  if still elevated, then will move to insulin  Preterm labor symptoms and general obstetric precautions including but not limited to vaginal bleeding, contractions, leaking of fluid and fetal movement were reviewed in detail with the patient. Please refer to After Visit Summary for other counseling recommendations.   No follow-ups on file.  Future Appointments  Date Time  Provider Department Center  05/31/2022  8:45 AM [facility] NURSE [facility]  05/31/2022  9:00 AM [facility] US1 [facility]  [facility]  05/31/2022  2:00 PM [facility] PHARMACIST [facility] None  06/13/2022 11:15 AM [facility]ED[facility]ATION [facility]  06/23/2022 10:30 AM [facility] LAB [facility] None  06/23/2022 10:45 AM Erenest Blank, NP [facility] None  07/03/2022  9:30 AM Tobb, Lavona Mound, DO [facility] None    Elsie Lincoln, MD

## 2022-05-30 ENCOUNTER — Encounter: Payer: Self-pay | Admitting: *Deleted

## 2022-05-31 ENCOUNTER — Encounter: Payer: Self-pay | Admitting: Pharmacist

## 2022-05-31 ENCOUNTER — Ambulatory Visit: Payer: BC Managed Care – PPO | Admitting: *Deleted

## 2022-05-31 ENCOUNTER — Other Ambulatory Visit: Payer: Self-pay | Admitting: *Deleted

## 2022-05-31 ENCOUNTER — Ambulatory Visit (INDEPENDENT_AMBULATORY_CARE_PROVIDER_SITE_OTHER): Payer: BC Managed Care – PPO | Admitting: Pharmacist

## 2022-05-31 ENCOUNTER — Ambulatory Visit: Payer: BC Managed Care – PPO | Attending: Obstetrics & Gynecology

## 2022-05-31 VITALS — BP 126/69 | HR 94

## 2022-05-31 VITALS — BP 129/85 | HR 97

## 2022-05-31 DIAGNOSIS — O10919 Unspecified pre-existing hypertension complicating pregnancy, unspecified trimester: Secondary | ICD-10-CM

## 2022-05-31 DIAGNOSIS — O09512 Supervision of elderly primigravida, second trimester: Secondary | ICD-10-CM

## 2022-05-31 DIAGNOSIS — O0990 Supervision of high risk pregnancy, unspecified, unspecified trimester: Secondary | ICD-10-CM | POA: Diagnosis not present

## 2022-05-31 DIAGNOSIS — I129 Hypertensive chronic kidney disease with stage 1 through stage 4 chronic kidney disease, or unspecified chronic kidney disease: Secondary | ICD-10-CM

## 2022-05-31 DIAGNOSIS — O24415 Gestational diabetes mellitus in pregnancy, controlled by oral hypoglycemic drugs: Secondary | ICD-10-CM | POA: Diagnosis not present

## 2022-05-31 DIAGNOSIS — Z3A19 19 weeks gestation of pregnancy: Secondary | ICD-10-CM

## 2022-05-31 DIAGNOSIS — O09522 Supervision of elderly multigravida, second trimester: Secondary | ICD-10-CM

## 2022-05-31 DIAGNOSIS — O26839 Pregnancy related renal disease, unspecified trimester: Secondary | ICD-10-CM | POA: Insufficient documentation

## 2022-05-31 DIAGNOSIS — O283 Abnormal ultrasonic finding on antenatal screening of mother: Secondary | ICD-10-CM

## 2022-05-31 DIAGNOSIS — N182 Chronic kidney disease, stage 2 (mild): Secondary | ICD-10-CM

## 2022-05-31 DIAGNOSIS — O10212 Pre-existing hypertensive chronic kidney disease complicating pregnancy, second trimester: Secondary | ICD-10-CM

## 2022-05-31 DIAGNOSIS — O24419 Gestational diabetes mellitus in pregnancy, unspecified control: Secondary | ICD-10-CM

## 2022-05-31 DIAGNOSIS — O99412 Diseases of the circulatory system complicating pregnancy, second trimester: Secondary | ICD-10-CM

## 2022-05-31 DIAGNOSIS — Z362 Encounter for other antenatal screening follow-up: Secondary | ICD-10-CM

## 2022-05-31 DIAGNOSIS — O10912 Unspecified pre-existing hypertension complicating pregnancy, second trimester: Secondary | ICD-10-CM

## 2022-05-31 DIAGNOSIS — D649 Anemia, unspecified: Secondary | ICD-10-CM

## 2022-05-31 DIAGNOSIS — O99012 Anemia complicating pregnancy, second trimester: Secondary | ICD-10-CM

## 2022-05-31 DIAGNOSIS — I471 Supraventricular tachycardia, unspecified: Secondary | ICD-10-CM

## 2022-05-31 DIAGNOSIS — O3500X0 Maternal care for (suspected) central nervous system malformation or damage in fetus, unspecified, not applicable or unspecified: Secondary | ICD-10-CM

## 2022-05-31 LAB — AFP, SERUM, OPEN SPINA BIFIDA
AFP MoM: 1.59
AFP Value: 72.7 ng/mL
Gest. Age on Collection Date: 18.7 weeks
Maternal Age At EDD: 39.1 yr
OSBR Risk 1 IN: 4310
Test Results:: NEGATIVE
Weight: 194 [lb_av]

## 2022-05-31 NOTE — Patient Instructions (Signed)
It was nice meeting you today  We would like your blood pressure to stay less than 130/80  We would like your blood sugar to be less than 140 one hour after eating and less than 120 two hours after eating  Please message or call with any questions  Laural Golden, PharmD, BCACP, CDCES, CPP [address], [address] [place], [place], [address] Phone: [phone], Fax: [phone]

## 2022-05-31 NOTE — Progress Notes (Signed)
Patient ID: Selena Meyer                 DOB: 1985-05-05                      MRN: [id number]     HPI: Selena Meyer is a 37 y.o. female referred by Dr. Servando Salina for HTN and blood glucose management. PMH is significant for HTN and GDM and obesity. Seen for nurse visit this morning and BP was well controlled at 126/69.  Patient presents today to discuss HTN management. Currently on labetalol 200mg  TID and amlodipine 10mg  daily. Tolerating both. Is checking BP at home, did not bring log but reports is improving.  On aspirin 162mg  for preeclampsia prophylaxis.  Blood sugar readings post prandial have not been controlled however. Reports FBG readings between 80-90 however post prandial readings very elevated. Had a meeting with dietician however she needs further education. OB has recommended insulin if post prandial readings do not decrease.  Diet has been high in salt and carbohydrates. Also prefers to drink sodas, sweet tea, and fruit juices.   Current HTN meds:  Amlodipine 10mg  daily Labetalol 200mg  TID   Wt Readings from Last 3 Encounters:  05/29/22 194 lb (88 kg)  05/26/22 195 lb (88.5 kg)  05/23/22 195 lb (88.5 kg)   BP Readings from Last 3 Encounters:  05/31/22 126/69  05/29/22 (!) 142/80  05/26/22 (!) 152/88   Pulse Readings from Last 3 Encounters:  05/31/22 94  05/29/22 90  05/26/22 (!) 109    Renal function: Estimated Creatinine Clearance: 72 mL/min (A) (by C-G formula based on SCr of 1.16 mg/dL (H)).  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 FSGS vs MCD, biopsy  09/2017 podocytopathy but nondiagnostic.    Hypertension    Hypokalemia 11/02/2020   Irregular menstrual cycle    Irregular periods/menstrual cycles 05/03/2017   Will hold off on Provera or other fertility stuff for now, pt advised pregnancy is not recommended until BP under better control at least, plus other workup pen   Lumbar radiculopathy 09/22/2019   Polycystic ovaries 11/09/2015   Proteinuria 02/28/2013   Proteinuria 02/28/2013   Stage 2 chronic kidney disease 04/27/2020   SVT (supraventricular tachycardia) 11/01/2020   Systolic murmur 07/16/2017   Tobacco abuse 02/28/2013   Uncontrolled hypertension 05/10/2017    Current Outpatient Medications on File Prior to Visit  Medication Sig Dispense Refill   Accu-Chek Softclix Lancets lancets 100 each by Other route 4 (four) times daily. Use as instructed 100 each 12   acetaminophen (TYLENOL) 500 MG tablet Take 1,000 mg by mouth every 6 (six) hours as needed for mild pain.      albuterol (VENTOLIN HFA) 108 (90 Base) MCG/ACT inhaler Inhale 2 puffs into the lungs every 6 (six) hours as needed for wheezing or shortness of breath. 1 each 1   amLODipine (NORVASC) 10 MG tablet Take 1 tablet by mouth once daily 90 tablet 0   aspirin EC 81 MG tablet Take 2 tablets (162 mg total) by mouth daily. Swallow whole. 30 tablet 12   Continuous Glucose Sensor (  FREESTYLE LIBRE 3 SENSOR) MISC Place 1 sensor on the skin every 14 days. Use to check glucose continuously 6 each 1   Cranberry 500 MG CAPS Take 1 capsule by mouth daily.     ferrous sulfate 325 (65 FE) MG tablet Take by mouth.     glucose blood test strip Use as instructed 100 each 12   ipratropium (ATROVENT) 0.06 % nasal spray Place 2 sprays into both nostrils 4 (four) times daily. As needed for runny nose / postnasal drip 15 mL 1   labetalol (NORMODYNE) 200 MG tablet Take 1 tablet (200 mg total) by mouth every 8 (eight) hours. 270 tablet 3   magnesium oxide (MAG-OX) 400 MG tablet Take 0.5 tablets  by mouth daily.     metFORMIN (GLUCOPHAGE) 500 MG tablet Take two tablets in the morning and one tablet at night. 90 tablet 5   metoCLOPramide (REGLAN) 10 MG tablet Take 1 tablet (10 mg total) by mouth 3 (three) times daily before meals. 30 tablet 0   Omega-3 Fatty Acids (FISH OIL) 1000 MG CAPS Take by mouth.     ondansetron (ZOFRAN) 8 MG tablet Take 1 tablet (8 mg total) by mouth every 8 (eight) hours as needed for nausea or vomiting. 30 tablet 1   Prenatal Vit-Fe Fumarate-FA (MULTIVITAMIN-PRENATAL) 27-0.8 MG TABS tablet Take 1 tablet by mouth daily at 12 noon.     No current facility-administered medications on file prior to visit.    Allergies  Allergen Reactions   Ibuprofen Hives     Assessment/Plan:  1. Hypertension -  Patient BP 129/85 which is improved from last visit with Dr Servando Salina. Discussed importance of controlling blood pressure and blood sugar in pregnancy. Briefly explained pathophysiology of GDM and the role of exercise and diet changes.  Patient needs further diet education. Diet currently high in sodium and carbohydrates, including simple carbohydrates. Printed planning healthy meals handout as well as blood sugar log. Instructed patient to continue to check BP daily and BG 4x a day and log foods. Will follow up with patient in 1 week fur further education.  Laural Golden, PharmD, BCACP, CDCES, CPP [address] [place], [place], [address] Phone: [phone], Fax: [phone]

## 2022-06-06 ENCOUNTER — Encounter: Payer: Self-pay | Admitting: Pharmacist

## 2022-06-06 ENCOUNTER — Ambulatory Visit: Payer: BC Managed Care – PPO | Attending: Cardiovascular Disease | Admitting: Pharmacist

## 2022-06-06 VITALS — BP 136/85 | HR 88 | Wt 195.6 lb

## 2022-06-06 DIAGNOSIS — O10919 Unspecified pre-existing hypertension complicating pregnancy, unspecified trimester: Secondary | ICD-10-CM | POA: Diagnosis not present

## 2022-06-06 DIAGNOSIS — O0990 Supervision of high risk pregnancy, unspecified, unspecified trimester: Secondary | ICD-10-CM

## 2022-06-06 DIAGNOSIS — Z3A17 17 weeks gestation of pregnancy: Secondary | ICD-10-CM

## 2022-06-06 DIAGNOSIS — O24415 Gestational diabetes mellitus in pregnancy, controlled by oral hypoglycemic drugs: Secondary | ICD-10-CM | POA: Diagnosis not present

## 2022-06-06 NOTE — Patient Instructions (Addendum)
It was nice seeing you again  We would like your fasting blood sugar to be less than or equal to 95. One hour after a meal, less than 140. Two hours after a meal, less than 120.  If you think you ate something too high in sugar, or if your blood sugar is above one of your goal levels, please exercise  Focus on vegetables, proteins, healthy fats, and whole grains  Please message with any questions  Laural Golden, PharmD, BCACP, CDCES, CPP [address] [place], [place], [address] Phone: [phone], Fax: [phone]

## 2022-06-06 NOTE — Progress Notes (Signed)
Patient ID: [name]                 DOB: Aug 24, 1985                      MRN: [id number]     HPI: Selena Meyer is a 37 y.o. female referred by Dr. Servando Salina for gestational HTN and DM. Currently 19 weeks 6 days pregnant. G1P0.  Patient presents today with her aunt. Reports her blood pressure this morning was 114/74 but forgot blood pressure and blood sugar logs.   Selena Meyer out of town this weekend to [facility] and was more active than usual. Noticed blood sugars were better controlled.  Began diet education at last visit but had patient come back today for further teaching. Reports fasting blood sugar has been at goal however post prandial readings remain elevated, especially in the evening. Left blood sugar log in husbands car. Will send picture over myChart tonight.  Current HTN meds:  Amlodipine 10mg  daily Labetalol 200mg  TID   Wt Readings from Last 3 Encounters:  05/29/22 194 lb (88 kg)  05/26/22 195 lb (88.5 kg)  05/23/22 195 lb (88.5 kg)   BP Readings from Last 3 Encounters:  05/31/22 129/85  05/31/22 126/69  05/29/22 (!) 142/80   Pulse Readings from Last 3 Encounters:  05/31/22 97  05/31/22 94  05/29/22 90    Renal function: Estimated Creatinine Clearance: 72 mL/min (A) (by C-G formula based on SCr of 1.16 mg/dL (H)).  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    Hypertension    Hypokalemia 11/02/2020   Irregular menstrual cycle    Irregular periods/menstrual cycles 05/03/2017   Will hold off on  Provera or other fertility stuff for now, pt advised pregnancy is not recommended until BP under better control at least, plus other workup pen   Lumbar radiculopathy 09/22/2019   Polycystic ovaries 11/09/2015   Proteinuria 02/28/2013   Proteinuria 02/28/2013   Stage 2 chronic kidney disease 04/27/2020   SVT (supraventricular tachycardia) 11/01/2020   Systolic murmur 07/16/2017   Tobacco abuse 02/28/2013   Uncontrolled hypertension 05/10/2017    Current Outpatient Medications on File Prior to Visit  Medication Sig Dispense Refill   Accu-Chek Softclix Lancets lancets 100 each by Other route 4 (four) times daily. Use as instructed 100 each 12   acetaminophen (TYLENOL) 500 MG tablet Take 1,000 mg by mouth every 6 (six) hours as needed for mild pain.      albuterol (VENTOLIN HFA) 108 (90 Base) MCG/ACT inhaler Inhale 2 puffs into the lungs every 6 (six) hours as needed for wheezing or shortness of breath. 1 each 1   amLODipine (NORVASC) 10 MG tablet Take 1 tablet by mouth once daily 90 tablet 0   aspirin EC 81 MG tablet Take 2 tablets (162 mg total) by mouth daily. Swallow whole. 30 tablet 12   Continuous Glucose Sensor (FREESTYLE LIBRE 3 SENSOR) MISC Place 1 sensor on the skin every 14 days. Use to check glucose continuously 6 each 1   Cranberry 500 MG  CAPS Take 1 capsule by mouth daily.     ferrous sulfate 325 (65 FE) MG tablet Take by mouth.     glucose blood test strip Use as instructed 100 each 12   ipratropium (ATROVENT) 0.06 % nasal spray Place 2 sprays into both nostrils 4 (four) times daily. As needed for runny nose / postnasal drip 15 mL 1   labetalol (NORMODYNE) 200 MG tablet Take 1 tablet (200 mg total) by mouth every 8 (eight) hours. 270 tablet 3   magnesium oxide (MAG-OX) 400 MG tablet Take 0.5 tablets by mouth daily.     metFORMIN (GLUCOPHAGE) 500 MG tablet Take two tablets in the morning and one tablet at night. 90 tablet 5   metoCLOPramide (REGLAN) 10 MG tablet Take 1 tablet  (10 mg total) by mouth 3 (three) times daily before meals. 30 tablet 0   Omega-3 Fatty Acids (FISH OIL) 1000 MG CAPS Take by mouth.     ondansetron (ZOFRAN) 8 MG tablet Take 1 tablet (8 mg total) by mouth every 8 (eight) hours as needed for nausea or vomiting. 30 tablet 1   Prenatal Vit-Fe Fumarate-FA (MULTIVITAMIN-PRENATAL) 27-0.8 MG TABS tablet Take 1 tablet by mouth daily at 12 noon.     No current facility-administered medications on file prior to visit.    Allergies  Allergen Reactions   Ibuprofen Hives     Assessment/Plan:  1. Hypertension -  Patient BP slightly higher in office today than previous at 136/85. Has not taken midday labetalol yet. Has OB f/u on 6/3. No med changes at this time.  2. GDM - Unclear what patient's home readings are since she forgot log. Will send over in message.  Discussed diet and exercise in detail with patient and aunt. Encouraged increasing physical activity especially if post prandial readings are above goal.  Went over balancing vegetables, healthy fats and lean proteins while minimizing carbohydrates. Will reach back out to patient when she sends in her log.  Follow up in 2 weeks.  Laural Golden, PharmD, BCACP, CDCES, CPP [address], [address] [place], [place], [address] Phone: [phone], Fax: [phone]

## 2022-06-10 DIAGNOSIS — Z419 Encounter for procedure for purposes other than remedying health state, unspecified: Secondary | ICD-10-CM | POA: Diagnosis not present

## 2022-06-12 ENCOUNTER — Ambulatory Visit (INDEPENDENT_AMBULATORY_CARE_PROVIDER_SITE_OTHER): Payer: BC Managed Care – PPO | Admitting: Obstetrics and Gynecology

## 2022-06-12 ENCOUNTER — Encounter: Payer: Self-pay | Admitting: Obstetrics and Gynecology

## 2022-06-12 VITALS — BP 141/87 | HR 88 | Wt 194.0 lb

## 2022-06-12 DIAGNOSIS — I517 Cardiomegaly: Secondary | ICD-10-CM

## 2022-06-12 DIAGNOSIS — O10919 Unspecified pre-existing hypertension complicating pregnancy, unspecified trimester: Secondary | ICD-10-CM

## 2022-06-12 DIAGNOSIS — O24415 Gestational diabetes mellitus in pregnancy, controlled by oral hypoglycemic drugs: Secondary | ICD-10-CM

## 2022-06-12 DIAGNOSIS — O0990 Supervision of high risk pregnancy, unspecified, unspecified trimester: Secondary | ICD-10-CM | POA: Diagnosis not present

## 2022-06-12 DIAGNOSIS — O26839 Pregnancy related renal disease, unspecified trimester: Secondary | ICD-10-CM

## 2022-06-12 DIAGNOSIS — O09512 Supervision of elderly primigravida, second trimester: Secondary | ICD-10-CM

## 2022-06-12 NOTE — Progress Notes (Signed)
Pt presents for ROB visit. Pt reports bright red spotting 2 days ago. Pt has concerns about US done 05-31-22.

## 2022-06-12 NOTE — Progress Notes (Signed)
   PRENATAL VISIT NOTE  Subjective:  Selena Meyer is a 37 y.o. G1P0 at [redacted]w[redacted]d being seen today for ongoing prenatal care.  She is currently monitored for the following issues for this high-risk pregnancy and has IDA (iron deficiency anemia); Supervision of high risk pregnancy, antepartum; Gestational diabetes mellitus (GDM), antepartum; AMA (advanced maternal age) primigravida 66+; Chronic hypertension during pregnancy, antepartum; Kidney disease in pregnancy, unspecified trimester; Morbid obesity (HCC); and Moderate left ventricular hypertrophy on their problem list.  Patient reports no complaints.  Contractions: Not present. Vag. Bleeding: Scant.  Movement: Present. Denies leaking of fluid.   The following portions of the patient's history were reviewed and updated as appropriate: allergies, current medications, past family history, past medical history, past social history, past surgical history and problem list.   Objective:   Vitals:   06/12/22 1549 06/12/22 1620  BP: (!) 143/96 (!) 141/87  Pulse: 91 88  Weight: 194 lb (88 kg)     Fetal Status: Fetal Heart Rate (bpm): 165   Movement: Present     General:  Alert, oriented and cooperative. Patient is in no acute distress.  Skin: Skin is warm and dry. No rash noted.   Cardiovascular: Normal heart rate noted  Respiratory: Normal respiratory effort, no problems with respiration noted  Abdomen: Soft, gravid, appropriate for gestational age.  Pain/Pressure: Present     Pelvic: Cervical exam deferred        Extremities: Normal range of motion.  Edema: Trace  Mental Status: Normal mood and affect. Normal behavior. Normal judgment and thought content.   Assessment and Plan:  Pregnancy: G1P0 at [redacted]w[redacted]d 1. Supervision of high risk pregnancy, antepartum Patient is doing well  2. Chronic hypertension during pregnancy, antepartum BP elevated. Patient feels that it is related to white coat syndrome Patient also admits that she does not always  take it 3 times a days as she is forgetful or does not wake up to her alarm. Patient plans to make efforts to be better compliant Continue labetalol to TID dosing and continue amlodipine Continue ASA  3. Primigravida of advanced maternal age in second trimester Normal NIPS and AFP  4. Moderate left ventricular hypertrophy Will obtain CMV and Toxo titers per MFM Follow up ultrasound 6/20  5. Kidney disease in pregnancy, unspecified trimester   6. Morbid obesity (HCC)   7. Gestational diabetes mellitus (GDM) controlled on oral hypoglycemic drug, antepartum CBGs reviewed and fasting remain within range. Patient has a few elevated values 130-142 range due to dietary indiscretions. Advised patient to consume a protein rich snack to prevent overeating Follow up with diabetes educator tomorrow Fetal echo ordered Follow up MFM scan for growth and abnormal anatomy  Preterm labor symptoms and general obstetric precautions including but not limited to vaginal bleeding, contractions, leaking of fluid and fetal movement were reviewed in detail with the patient. Please refer to After Visit Summary for other counseling recommendations.   Return in about 4 weeks (around 07/10/2022) for in person, ROB, High risk.  Future Appointments  Date Time Provider Department Center  06/13/2022 11:15 AM [facility]  06/23/2022 10:30 AM [facility] LAB [facility] None  06/23/2022 10:45 AM Erenest Blank, NP [facility] None  06/29/2022  7:15 AM [facility] NURSE [facility]  06/29/2022  7:30 AM [facility] US3 [facility]  06/30/2022  9:30 AM [facility] PH[facility]AC[facility] None  07/03/2022  9:30 AM Tobb, Lavona Mound, DO [facility] None    Catalina Antigua, MD

## 2022-06-12 NOTE — Addendum Note (Signed)
Addended by: Kathie Dike on: 06/12/2022 04:39 PM   Modules accepted: Orders

## 2022-06-13 ENCOUNTER — Ambulatory Visit (INDEPENDENT_AMBULATORY_CARE_PROVIDER_SITE_OTHER): Payer: BC Managed Care – PPO | Admitting: Registered"

## 2022-06-13 ENCOUNTER — Other Ambulatory Visit: Payer: Self-pay

## 2022-06-13 ENCOUNTER — Encounter: Payer: BC Managed Care – PPO | Attending: Obstetrics & Gynecology | Admitting: Registered"

## 2022-06-13 DIAGNOSIS — Z3A20 20 weeks gestation of pregnancy: Secondary | ICD-10-CM

## 2022-06-13 DIAGNOSIS — O24415 Gestational diabetes mellitus in pregnancy, controlled by oral hypoglycemic drugs: Secondary | ICD-10-CM

## 2022-06-13 LAB — CMV ABS, IGG+IGM (CYTOMEGALOVIRUS)
CMV Ab - IgG: >10 U/mL — ABNORMAL HIGH (ref 0.00–0.59)
CMV IgM Ser EIA-aCnc: <30 AU/mL (ref 0.0–29.9)

## 2022-06-13 LAB — TOXOPLASMA ANTIBODIES- IGG AND  IGM
Toxoplasma Antibody- IgM: <3 AU/mL (ref 0.0–7.9)
Toxoplasma IgG Ratio: <3 IU/mL (ref 0.0–7.1)

## 2022-06-13 NOTE — Progress Notes (Signed)
Patient was seen for initial GDM self-management on 04/27/22  Follow up today 06/13/2022  Start time 1130 and End time 1227  Estimated due date: 10/25/22; [redacted]w[redacted]d  Next ob visit 07/11/22 at [facility]  Clinical: Medications: Metformin 1000 mg morning; 500 mg evening (was increased by 500 mg 2 weeks ago) Medical History: PCOS, Stage 2 CKD, HTN, anemia Labs:  No results found for: "HGBA1C"   Dietary and Lifestyle History:  Pt reports FBS: 70-80s but lunch and dinner readings tend to be high.  Pt reprots N/V continues. Pt states she was told to check 1 hr after eating. Pt states she has been working with pharmacist at [facility] and Dr. Penne Lash and Dr. Jolayne Panther   Pt states 2 times she ate similar spaghetti dinner and once was in the 140s the other time was 109 mg/dL.  140 event: 2 cups wheat spaghetti, [place] meat sauce, salad, diet ginger beer. 1  109 event: ate similar meal at graduation party on Sat (veggie alfredo, 1 chicken wings, 2 c spaghetti, sweet tea, water). Some differences were: (N/V) earlier in the day, only ate some crackers with 1 hr BG of 117. Later walked ~800 feet from parking to the party, ate meal of ( and visited about 20 min. Walked back to care and drove home.  Pt states her husband gets off at 3:30 and goes to basketball court and she walks around the court.  Pt states she was given a Libre 3 sensor but did not start it because the price to get more was over $300.   Pt had questions about starting the sensor and downloaded the [place] app on her phone. Pt made visit to start CGM on 6/13.  Physical Activity: not assessed Stress: not assessed Sleep: not assessed  24 hr Recall: not assessed today   NUTRITION INTERVENTION  Nutrition education (E-1) on the following topics:   Initial Follow-up  [x]  []  [x]  Definition of Gestational Diabetes    Insulin needs start increasing at 24 weeks []  []  Why dietary management is important in controlling blood  glucose [x]  []  Effects each nutrient has on blood glucose levels []  []  Simple carbohydrates vs complex carbohydrates [x]  []  Fluid intake [x]  [x]  [x]  Creating a balanced meal plan [x]  []  Carbohydrate counting  [x]  []  When to check blood glucose levels [x]  []  Proper blood glucose monitoring techniques [x]  []  Effect of stress and stress reduction techniques  [x]  []  [x]  Exercise effect on blood glucose levels, appropriate exercise during pregnancy []  []  Importance of limiting caffeine and abstaining from alcohol and smoking []  [x]  [x]  Medications used for BG control during pregnancy intro to insulin pump / insulin pen []  [x]  Hypoglycemia and rule of 15 []  []  [x]  Postpartum self care   [x]  CGM   Plan: Less pasta, more protein Exercise before lunch and/or dinner Make sure Radene Journey is not expired Consider your insulin options   Patient will be seen for follow-up in 9 days to start Libre 3.

## 2022-06-15 ENCOUNTER — Encounter: Payer: Self-pay | Admitting: Family Medicine

## 2022-06-15 DIAGNOSIS — O359XX0 Maternal care for (suspected) fetal abnormality and damage, unspecified, not applicable or unspecified: Secondary | ICD-10-CM | POA: Insufficient documentation

## 2022-06-22 ENCOUNTER — Ambulatory Visit (INDEPENDENT_AMBULATORY_CARE_PROVIDER_SITE_OTHER): Payer: BC Managed Care – PPO | Admitting: Registered"

## 2022-06-22 ENCOUNTER — Other Ambulatory Visit: Payer: Self-pay

## 2022-06-22 ENCOUNTER — Encounter: Payer: BC Managed Care – PPO | Admitting: Registered"

## 2022-06-22 ENCOUNTER — Telehealth: Payer: Self-pay | Admitting: Pharmacist

## 2022-06-22 DIAGNOSIS — O24415 Gestational diabetes mellitus in pregnancy, controlled by oral hypoglycemic drugs: Secondary | ICD-10-CM

## 2022-06-22 DIAGNOSIS — Z3A22 22 weeks gestation of pregnancy: Secondary | ICD-10-CM

## 2022-06-22 MED ORDER — FREESTYLE LIBRE 3 SENSOR MISC
6 refills | Status: DC
Start: 2022-06-22 — End: 2022-10-02

## 2022-06-22 NOTE — Telephone Encounter (Signed)
Approved. This drug has been approved. Approved quantity: 2 units per 28 day(s). The drug has been approved from 06/08/2022 to 12/19/2022. Please call the pharmacy to process your prescription claim. Generic or biosimilar substitution may be required when available and preferred on the formulary. Authorization Expiration Date: 12/19/2022  PA for [employer] sensors approved. Will send to pharmacy.

## 2022-06-22 NOTE — Telephone Encounter (Signed)
PA for [employer] 3 submitted.  Key: [id number]

## 2022-06-22 NOTE — Progress Notes (Signed)
Dr. Tawanna Cooler  Send note regarding pre-auth to Pharmacist Laural Golden, [facility]  Phone: [phone], Fax: [phone]   CGM Training - Freestyle Libre 3 06/22/22  Start [id number]    End time: 0956 Total time: 28 min  [x]   Download app to phone [x]   Selena Meyer for reports and data sharing. Pt entered in clinic code to share with Marylene Land at [facility] Diabetes [x]   Getting to know device: Sensor:  30 min in water up to 3 meters. Receiver: Phone vs reader [x]   Setting up device (high alert 250, low alert 60 mg/dL  ) []   Rise alert set at   n/a mg/dL  []   Fall alert set at    n/a  mg/dL  [x]   Setting alert profile: alarms are very loud. Also, not to panic if getting message that blood sugar is dropping [x]   Inserting sensor: After application it takes 1 hr initial warm up before can get a reading. First 12 hrs blood drop symbol means to use finger stick to make treatment decisions [x]   Calibrating  [x]   Ending sensor session [x]   Trouble shooting: High dose vitamin C >1000 mg/day can affect accuracy of readings [x]   Tape guide:  Tegaderm I.V. or Over-bandage. Be sure to leave the opening/hole over the center of the sensor uncovered []   Insulin dosing from CGM readings. N/A  Patient has Careers information officer support and my contact information.

## 2022-06-23 ENCOUNTER — Inpatient Hospital Stay (HOSPITAL_BASED_OUTPATIENT_CLINIC_OR_DEPARTMENT_OTHER): Payer: BC Managed Care – PPO | Admitting: Family

## 2022-06-23 ENCOUNTER — Telehealth: Payer: Self-pay

## 2022-06-23 ENCOUNTER — Encounter: Payer: Self-pay | Admitting: Family

## 2022-06-23 ENCOUNTER — Inpatient Hospital Stay: Payer: BC Managed Care – PPO | Attending: Family

## 2022-06-23 VITALS — BP 144/101 | HR 93 | Temp 98.7°F | Resp 17 | Wt 195.4 lb

## 2022-06-23 DIAGNOSIS — N920 Excessive and frequent menstruation with regular cycle: Secondary | ICD-10-CM | POA: Insufficient documentation

## 2022-06-23 DIAGNOSIS — D500 Iron deficiency anemia secondary to blood loss (chronic): Secondary | ICD-10-CM | POA: Diagnosis not present

## 2022-06-23 LAB — FERRITIN: Ferritin: 17 ng/mL (ref 11–307)

## 2022-06-23 LAB — RETICULOCYTES
Immature Retic Fract: 22.4 % — ABNORMAL HIGH (ref 2.3–15.9)
RBC.: 3.73 MIL/uL — ABNORMAL LOW (ref 3.87–5.11)
Retic Count, Absolute: 84.7 10*3/uL (ref 19.0–186.0)
Retic Ct Pct: 2.3 % (ref 0.4–3.1)

## 2022-06-23 LAB — CBC WITH DIFFERENTIAL (CANCER CENTER ONLY)
Abs Immature Granulocytes: 0.06 10*3/uL (ref 0.00–0.07)
Basophils Absolute: 0.1 10*3/uL (ref 0.0–0.1)
Basophils Relative: 0 %
Eosinophils Absolute: 0.2 10*3/uL (ref 0.0–0.5)
Eosinophils Relative: 1 %
HCT: 34.2 % — ABNORMAL LOW (ref 36.0–46.0)
Hemoglobin: 11.8 g/dL — ABNORMAL LOW (ref 12.0–15.0)
Immature Granulocytes: 0 %
Lymphocytes Relative: 13 %
Lymphs Abs: 1.8 10*3/uL (ref 0.7–4.0)
MCH: 31.6 pg (ref 26.0–34.0)
MCHC: 34.5 g/dL (ref 30.0–36.0)
MCV: 91.7 fL (ref 80.0–100.0)
Monocytes Absolute: 1 10*3/uL (ref 0.1–1.0)
Monocytes Relative: 8 %
Neutro Abs: 10.7 10*3/uL — ABNORMAL HIGH (ref 1.7–7.7)
Neutrophils Relative %: 78 %
Platelet Count: 321 10*3/uL (ref 150–400)
RBC: 3.73 MIL/uL — ABNORMAL LOW (ref 3.87–5.11)
RDW: 13 % (ref 11.5–15.5)
WBC Count: 13.8 10*3/uL — ABNORMAL HIGH (ref 4.0–10.5)
nRBC: 0 % (ref 0.0–0.2)

## 2022-06-23 LAB — IRON AND IRON BINDING CAPACITY (CC-WL,HP ONLY)
Iron: 102 ug/dL (ref 28–170)
Saturation Ratios: 25 % (ref 10.4–31.8)
TIBC: 405 ug/dL (ref 250–450)
UIBC: 303 ug/dL (ref 148–442)

## 2022-06-23 NOTE — Progress Notes (Signed)
Hematology and Oncology Follow Up Visit  Selena Meyer [id number] 05-25-1985 37 y.o. 06/23/2022   Principle Diagnosis:  Iron deficiency anemia secondary to heavy cycles     Current Therapy:        IV iron as indicated    Interim History:  Selena Meyer is here today for follow-up. She is now 6 months pregnant, due 10/16.  She has some fatigue and nausea at times but otherwise seems to be doing well.  She was diagnosed with gestational diabetes but is not currently on treatment. She is working on her diet and exercise.  No fever, chills, n/v, cough, rash, dizziness, SOB, chest pain, palpitations, abdominal pain or changes in bowel or bladder habits.  Intermittent puffiness in her feet and ankles comes and goes.  She has had occasional sciatica in her left upper leg.  No falls or syncope reported.  Appetite and hydration are good. Weight is stable at 195 lbs.   ECOG Performance Status: 1 - Symptomatic but completely ambulatory  Medications:  Allergies as of 06/23/2022       Reactions   Ibuprofen Hives        Medication List        Accurate as of June 23, 2022 10:55 AM. If you have any questions, ask your nurse or doctor.          Accu-Chek Softclix Lancets lancets 100 each by Other route 4 (four) times daily. Use as instructed   acetaminophen 500 MG tablet Commonly known as: TYLENOL Take 1,000 mg by mouth every 6 (six) hours as needed for mild pain.   albuterol 108 (90 Base) MCG/ACT inhaler Commonly known as: VENTOLIN HFA Inhale 2 puffs into the lungs every 6 (six) hours as needed for wheezing or shortness of breath.   amLODipine 10 MG tablet Commonly known as: NORVASC Take 1 tablet by mouth once daily   aspirin EC 81 MG tablet Take 2 tablets (162 mg total) by mouth daily. Swallow whole.   Cranberry 500 MG Caps Take 1 capsule by mouth daily.   ferrous sulfate 325 (65 FE) MG tablet Take by mouth.   Fish Oil 1000 MG Caps Take by mouth.   FreeStyle Libre 3  Sensor Misc Place 1 sensor on the skin every 14 days. Use to check glucose continuously   glucose blood test strip Use as instructed   ipratropium 0.06 % nasal spray Commonly known as: ATROVENT Place 2 sprays into both nostrils 4 (four) times daily. As needed for runny nose / postnasal drip   labetalol 200 MG tablet Commonly known as: NORMODYNE Take 1 tablet (200 mg total) by mouth every 8 (eight) hours.   magnesium oxide 400 MG tablet Commonly known as: MAG-OX Take 0.5 tablets by mouth daily.   metFORMIN 500 MG tablet Commonly known as: GLUCOPHAGE Take two tablets in the morning and one tablet at night.   metoCLOPramide 10 MG tablet Commonly known as: REGLAN Take 1 tablet (10 mg total) by mouth 3 (three) times daily before meals.   multivitamin-prenatal 27-0.8 MG Tabs tablet Take 1 tablet by mouth daily at 12 noon.   ondansetron 8 MG tablet Commonly known as: Zofran Take 1 tablet (8 mg total) by mouth every 8 (eight) hours as needed for nausea or vomiting.        Allergies:  Allergies  Allergen Reactions   Ibuprofen Hives    Past Medical History, Surgical history, Social history, and Family History were reviewed and updated.  Review of  Systems: All other 10 point review of systems is negative.   Physical Exam:  weight is 195 lb 6.4 oz (88.6 kg). Her oral temperature is 98.7 F (37.1 C). Her blood pressure is 145/99 (abnormal) and her pulse is 93. Her respiration is 17 and oxygen saturation is 100%.   Wt Readings from Last 3 Encounters:  06/23/22 195 lb 6.4 oz (88.6 kg)  06/12/22 194 lb (88 kg)  06/06/22 195 lb 9.6 oz (88.7 kg)    Ocular: Sclerae unicteric, pupils equal, round and reactive to light Ear-nose-throat: Oropharynx clear, dentition fair Lymphatic: No cervical or supraclavicular adenopathy Lungs no rales or rhonchi, good excursion bilaterally Heart regular rate and rhythm, no murmur appreciated Abd soft, nontender, positive bowel sounds MSK no  focal spinal tenderness, no joint edema Neuro: non-focal, well-oriented, appropriate affect Breasts: Deferred   Lab Results  Component Value Date   WBC 13.8 (H) 06/23/2022   HGB 11.8 (L) 06/23/2022   HCT 34.2 (L) 06/23/2022   MCV 91.7 06/23/2022   PLT 321 06/23/2022   Lab Results  Component Value Date   FERRITIN 45 02/21/2022   IRON 68 02/21/2022   TIBC 307 02/21/2022   UIBC 239 02/21/2022   IRONPCTSAT 22 02/21/2022   Lab Results  Component Value Date   RETICCTPCT 2.3 06/23/2022   RBC 3.73 (L) 06/23/2022   RBC 3.73 (L) 06/23/2022   RETICCTABS 63,000 11/08/2020   No results found for: "KPAFRELGTCHN", "LAMBDASER", "KAPLAMBRATIO" No results found for: "IGGSERUM", "IGA", "IGMSERUM" No results found for: "TOTALPROTELP", "ALBUMINELP", "A1GS", "A2GS", "BETS", "BETA2SER", "GAMS", "MSPIKE", "SPEI"   Chemistry      Component Value Date/Time   NA 135 05/23/2022 1855   NA 136 04/03/2022 1148   K 3.6 05/23/2022 1855   CL 102 05/23/2022 1855   CO2 21 (L) 05/23/2022 1855   BUN 9 05/23/2022 1855   BUN 9 04/03/2022 1148   CREATININE 1.16 (H) 05/23/2022 1855   CREATININE 1.37 (H) 12/16/2021 0857   CREATININE 1.34 (H) 11/15/2021 0000      Component Value Date/Time   CALCIUM 9.8 05/23/2022 1855   ALKPHOS 44 05/23/2022 1855   AST 24 05/23/2022 1855   AST 13 (L) 12/16/2021 0857   ALT 24 05/23/2022 1855   ALT 13 12/16/2021 0857   BILITOT 0.3 05/23/2022 1855   BILITOT 0.2 04/03/2022 1148   BILITOT 0.4 12/16/2021 0857       Impression and Plan: Selena Meyer is a very pleasant 37 yo African American female with long history of iron deficiency anemia.  Iron studies are pending.  Follow-up in 5 months.   Eileen Stanford, NP 6/14/202410:55 AM

## 2022-06-27 NOTE — Telephone Encounter (Signed)
No, I'm not sure about anything when it comes to insurance :- / I heard many months ago that Medicaid was covering CGMs without requiring insulin. But then patients were still getting denied. So I'm confused

## 2022-06-28 NOTE — Telephone Encounter (Signed)
PA was submitted to her Medicaid plan:  Form [facility] Medicaid of [employer] Prior Authorization Request Form [id number] NCPDP)

## 2022-06-29 ENCOUNTER — Other Ambulatory Visit: Payer: Self-pay | Admitting: *Deleted

## 2022-06-29 ENCOUNTER — Encounter: Payer: Self-pay | Admitting: Cardiology

## 2022-06-29 ENCOUNTER — Ambulatory Visit: Payer: BC Managed Care – PPO | Attending: Obstetrics and Gynecology

## 2022-06-29 ENCOUNTER — Encounter: Payer: Self-pay | Admitting: Obstetrics & Gynecology

## 2022-06-29 ENCOUNTER — Ambulatory Visit: Payer: BC Managed Care – PPO | Admitting: *Deleted

## 2022-06-29 VITALS — BP 133/80 | HR 97

## 2022-06-29 DIAGNOSIS — Q048 Other specified congenital malformations of brain: Secondary | ICD-10-CM

## 2022-06-29 DIAGNOSIS — O09512 Supervision of elderly primigravida, second trimester: Secondary | ICD-10-CM | POA: Insufficient documentation

## 2022-06-29 DIAGNOSIS — O24419 Gestational diabetes mellitus in pregnancy, unspecified control: Secondary | ICD-10-CM | POA: Insufficient documentation

## 2022-06-29 DIAGNOSIS — Z362 Encounter for other antenatal screening follow-up: Secondary | ICD-10-CM | POA: Insufficient documentation

## 2022-06-29 DIAGNOSIS — O3509X0 Maternal care for (suspected) other central nervous system malformation or damage in fetus, not applicable or unspecified: Secondary | ICD-10-CM

## 2022-06-29 DIAGNOSIS — O10912 Unspecified pre-existing hypertension complicating pregnancy, second trimester: Secondary | ICD-10-CM | POA: Insufficient documentation

## 2022-06-29 DIAGNOSIS — O99012 Anemia complicating pregnancy, second trimester: Secondary | ICD-10-CM

## 2022-06-29 DIAGNOSIS — O0990 Supervision of high risk pregnancy, unspecified, unspecified trimester: Secondary | ICD-10-CM

## 2022-06-29 DIAGNOSIS — O26839 Pregnancy related renal disease, unspecified trimester: Secondary | ICD-10-CM | POA: Diagnosis not present

## 2022-06-29 DIAGNOSIS — O24415 Gestational diabetes mellitus in pregnancy, controlled by oral hypoglycemic drugs: Secondary | ICD-10-CM

## 2022-06-29 DIAGNOSIS — O283 Abnormal ultrasonic finding on antenatal screening of mother: Secondary | ICD-10-CM | POA: Diagnosis not present

## 2022-06-29 DIAGNOSIS — O10012 Pre-existing essential hypertension complicating pregnancy, second trimester: Secondary | ICD-10-CM | POA: Diagnosis not present

## 2022-06-29 DIAGNOSIS — I471 Supraventricular tachycardia, unspecified: Secondary | ICD-10-CM

## 2022-06-29 DIAGNOSIS — I129 Hypertensive chronic kidney disease with stage 1 through stage 4 chronic kidney disease, or unspecified chronic kidney disease: Secondary | ICD-10-CM

## 2022-06-29 DIAGNOSIS — O10213 Pre-existing hypertensive chronic kidney disease complicating pregnancy, third trimester: Secondary | ICD-10-CM

## 2022-06-29 DIAGNOSIS — O99412 Diseases of the circulatory system complicating pregnancy, second trimester: Secondary | ICD-10-CM

## 2022-06-29 DIAGNOSIS — N182 Chronic kidney disease, stage 2 (mild): Secondary | ICD-10-CM

## 2022-06-29 DIAGNOSIS — D649 Anemia, unspecified: Secondary | ICD-10-CM

## 2022-06-29 DIAGNOSIS — Z3A23 23 weeks gestation of pregnancy: Secondary | ICD-10-CM

## 2022-06-29 NOTE — Telephone Encounter (Signed)
Thank you! I will pass this on to the clinical staff.

## 2022-06-30 ENCOUNTER — Encounter: Payer: Self-pay | Admitting: Pharmacist

## 2022-06-30 ENCOUNTER — Ambulatory Visit: Payer: BC Managed Care – PPO | Attending: Cardiovascular Disease | Admitting: Pharmacist

## 2022-06-30 VITALS — BP 141/85 | HR 92 | Wt 196.2 lb

## 2022-06-30 DIAGNOSIS — O10919 Unspecified pre-existing hypertension complicating pregnancy, unspecified trimester: Secondary | ICD-10-CM

## 2022-06-30 DIAGNOSIS — Z3A23 23 weeks gestation of pregnancy: Secondary | ICD-10-CM | POA: Diagnosis not present

## 2022-06-30 NOTE — Patient Instructions (Addendum)
It was good seeing you again  Please increase your labetalol to 300mg  in the morning, 200mg  in midday, and 300mg  in the evening  Please continue your amlodipine 10mg  daily  Continue to monitor your blood pressure at home and bring in your readings to Dr Servando Salina on Monday  Remember to walk if you see high blood pressure or blood sugar  Laural Golden, PharmD, BCACP, CDCES, CPP [address], [address] [place], [place], [address] Phone: [phone], Fax: [phone]

## 2022-06-30 NOTE — Progress Notes (Signed)
Patient ID: Selena Meyer                 DOB: September 23, 1985                      MRN: [id number]     HPI: Selena Meyer is a 37 y.o. female referred by Dr. Servando Salina for gestational HTN and GDM management. Patient currently 23w 2d pregnant.  Patient presents today concerned regarding blood pressure. Previously blood pressure well controlled and blood sugar was elevated. However now the opposite has happened. Blood sugars have been controlled and she has been experiencing blood pressure spikes despite following recommended diet changes.  Currently managed on amlodipine 10mg  daily and labetalol 200mg  TID without reported adverse effects.   Brought BP log. Blood pressure increased to 170/100 three times in past week. Blood pressure tends to decrease with walking which husband is encouraging her to do when BP increases.  Patient starting to feel overwhelmed with frequent medical appointments at different specialties.   Was able to get patient approved for [facility] sensors however patient has not picked up from pharmacy. Was using a sample sensor but it fell off.  Current HTN meds:  Amlodipine 10mg  daily Labetalol 200mg  TID   Wt Readings from Last 3 Encounters:  06/30/22 196 lb 3.2 oz (89 kg)  06/23/22 195 lb 6.4 oz (88.6 kg)  06/12/22 194 lb (88 kg)   BP Readings from Last 3 Encounters:  06/30/22 (!) 141/85  06/29/22 133/80  06/23/22 (!) 144/101   Pulse Readings from Last 3 Encounters:  06/30/22 92  06/29/22 97  06/23/22 93    Renal function: CrCl cannot be calculated (Patient's most recent lab result is older than the maximum 21 days allowed.).  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    Hypertension    Hypokalemia 11/02/2020   Irregular menstrual cycle    Irregular periods/menstrual cycles 05/03/2017   Will hold off on Provera or other fertility stuff for now, pt advised pregnancy is not recommended until BP under better control at least, plus other workup pen   Lumbar radiculopathy 09/22/2019   Polycystic ovaries 11/09/2015   Proteinuria 02/28/2013   Proteinuria 02/28/2013   Stage 2 chronic kidney disease 04/27/2020   SVT (supraventricular tachycardia) 11/01/2020   Systolic murmur 07/16/2017   Tobacco abuse 02/28/2013   Uncontrolled hypertension 05/10/2017    Current Outpatient Medications on File Prior to Visit  Medication Sig Dispense Refill   Accu-Chek Softclix Lancets lancets 100 each by Other route 4 (four) times daily. Use as instructed 100 each 12   acetaminophen (TYLENOL) 500 MG tablet Take 1,000 mg by mouth every 6 (six) hours as needed for mild pain.      albuterol (VENTOLIN HFA) 108 (90 Base) MCG/ACT inhaler Inhale 2 puffs into the lungs every 6 (six) hours as needed for wheezing or shortness of breath. 1 each 1   amLODipine (NORVASC) 10 MG tablet Take 1 tablet by mouth once daily 90 tablet 0   aspirin EC 81 MG tablet Take 2 tablets (  162 mg total) by mouth daily. Swallow whole. 30 tablet 12   Continuous Glucose Sensor (FREESTYLE LIBRE 3 SENSOR) MISC Place 1 sensor on the skin every 14 days. Use to check glucose continuously 2 each 6   Cranberry 500 MG CAPS Take 1 capsule by mouth daily.     ferrous sulfate 325 (65 FE) MG tablet Take by mouth.     glucose blood test strip Use as instructed 100 each 12   ipratropium (ATROVENT) 0.06 % nasal spray Place 2 sprays into both nostrils 4 (four) times daily. As needed for runny nose / postnasal drip 15 mL 1   labetalol (NORMODYNE) 200 MG  tablet Take 1 tablet (200 mg total) by mouth every 8 (eight) hours. 270 tablet 3   magnesium oxide (MAG-OX) 400 MG tablet Take 0.5 tablets by mouth daily.     metFORMIN (GLUCOPHAGE) 500 MG tablet Take two tablets in the morning and one tablet at night. 90 tablet 5   metoCLOPramide (REGLAN) 10 MG tablet Take 1 tablet (10 mg total) by mouth 3 (three) times daily before meals. 30 tablet 0   Omega-3 Fatty Acids (FISH OIL) 1000 MG CAPS Take by mouth.     ondansetron (ZOFRAN) 8 MG tablet Take 1 tablet (8 mg total) by mouth every 8 (eight) hours as needed for nausea or vomiting. 30 tablet 1   Prenatal Vit-Fe Fumarate-FA (MULTIVITAMIN-PRENATAL) 27-0.8 MG TABS tablet Take 1 tablet by mouth daily at 12 noon.     No current facility-administered medications on file prior to visit.    Allergies  Allergen Reactions   Ibuprofen Hives     Assessment/Plan:  1. Hypertension -  HYPERTENSION CONTROL Vitals:   06/30/22 0957 06/30/22 1020 06/30/22 1021  BP: (!) 151/94 135/86 (!) 141/85    The patient's blood pressure is elevated above target today.  In order to address the patient's elevated BP: A current anti-hypertensive medication was adjusted today.   Patient BP has increased over previous month. No discernable cause, possibly stress induced? Continues to limit carbohydrates and is exercising. Has appointment with Dr Servando Salina on Monday. Will increase labetalol at this time and have patient continue to monitor BP through the weekend.  Offered to teach patient how to apply [place] sensor if she is able to pick up from pharmacy.  Continue amlodipine 10mg  daily Increase labetolol to 300mg  in morning, 200mg  in afternoon, 300mg  in the evening Follow up with Dr Servando Salina in 3 days  Laural Golden, PharmD, BCACP, CDCES, CPP [address], [address] [place], [place], [address] Phone: [phone], Fax: [phone]

## 2022-07-03 ENCOUNTER — Ambulatory Visit: Payer: BC Managed Care – PPO | Attending: Cardiology | Admitting: Cardiology

## 2022-07-03 ENCOUNTER — Encounter: Payer: Self-pay | Admitting: Cardiology

## 2022-07-03 VITALS — BP 144/86 | HR 87 | Ht 65.0 in | Wt 197.8 lb

## 2022-07-03 DIAGNOSIS — Z794 Long term (current) use of insulin: Secondary | ICD-10-CM

## 2022-07-03 DIAGNOSIS — O09512 Supervision of elderly primigravida, second trimester: Secondary | ICD-10-CM

## 2022-07-03 DIAGNOSIS — I471 Supraventricular tachycardia, unspecified: Secondary | ICD-10-CM

## 2022-07-03 DIAGNOSIS — E119 Type 2 diabetes mellitus without complications: Secondary | ICD-10-CM

## 2022-07-03 DIAGNOSIS — O10919 Unspecified pre-existing hypertension complicating pregnancy, unspecified trimester: Secondary | ICD-10-CM

## 2022-07-03 DIAGNOSIS — O0990 Supervision of high risk pregnancy, unspecified, unspecified trimester: Secondary | ICD-10-CM

## 2022-07-03 DIAGNOSIS — Z3A23 23 weeks gestation of pregnancy: Secondary | ICD-10-CM

## 2022-07-03 MED ORDER — LABETALOL HCL 100 MG PO TABS: 100.0000 mg | ORAL_TABLET | Freq: Three times a day (TID) | ORAL | 3 refills | Status: DC

## 2022-07-03 NOTE — Progress Notes (Signed)
Cardio-Obstetrics Clinic  New Evaluation  Date:  07/03/2022   ID:  [name], DO B [birth year], MRN [id number]  PCP:  [name], NP   [facility] Providers Cardiologist:  Thomasene Ripple, DO  Electrophysiologist:  None       Referring MD: [name], NP   Chief Complaint: " I am ok"  History of Present Illness:    Selena Meyer is a 37 y.o. female [G1P0] who is being seen today for the evaluation of chronic hypertension  at the request of [name], NP.   Medical history includes SVT, Hypertension, hypertriglyceridemia, follows with cardiology and [facility].  She was referred to the cardiology clinic by her OB/GYN team.  At her last visit with me which was on May 25, 2020 I increased her labetalol to 200 mg every 8 hours.  We continued the amlodipine.  Will set her up to follow-up with our pharmacist team.  Since that visit she had follow-up and have had 3 visit.  At her last visit her labetalol was increased to 300 mg twice a day in the middle dose kept at 200 mg.  Thankfully she has had some improvement.  She is currently 23 weeks and 5 days pregnant.  Prior CV Studies Reviewed: The following studies were reviewed today:  TTE 2019 Study Conclusions   - Left ventricle: The cavity size was normal. Wall thickness was    increased in a pattern of moderate LVH. Systolic function was    normal. Wall motion was normal; there were no regional wall    motion abnormalities. Doppler parameters are consistent with    abnormal left ventricular relaxation (grade 1 diastolic    dysfunction).   -------------------------------------------------------------------  Study data:  No prior study was available for comparison.  Study  status:  Routine.  Procedure:  Transthoracic echocardiography.  Image quality was adequate.  Study completion:  There were no  complications.          Transthoracic echocardiography.  M-mode,  complete 2D, spectral Doppler, and color Doppler.   Birthdate:  Patient birthdate: 12/11/1985.  Age:  Patient is 37 yr old.  Sex:  Gender: female.    BMI: 32.7 kg/m^2.  Blood pressure:     140/101  Patient status:  Outpatient.  Study date:  Study date: 08/01/2017.  Study time: 10:14 AM.  Location:  Echo laboratory.   -------------------------------------------------------------------   -------------------------------------------------------------------  Left ventricle:  The cavity size was normal. Wall thickness was  increased in a pattern of moderate LVH. Systolic function was  normal. Wall motion was normal; there were no regional wall motion  abnormalities. Doppler parameters are consistent with abnormal left  ventricular relaxation (grade 1 diastolic dysfunction).   -------------------------------------------------------------------  Aortic valve:   Structurally normal valve. Trileaflet; normal  thickness leaflets. Cusp separation was normal. Mobility was not  restricted.  Doppler:  Transvalvular velocity was within the normal  range. There was no stenosis. There was no regurgitation.   -------------------------------------------------------------------  Aorta: The aorta was normal, not dilated, and non-diseased. Aortic  root: The aortic root was normal in size.   -------------------------------------------------------------------  Mitral valve:   Structurally normal valve.   Leaflet separation was  normal. Mobility was not restricted.  Doppler:  Transvalvular  velocity was within the normal range. There was no evidence for  stenosis. There was no regurgitation.    Peak gradient (D): 2 mm  Hg.   -------------------------------------------------------------------  Left atrium:  The atrium was normal in size.   -------------------------------------------------------------------  Atrial septum:  The septum was normal.   -------------------------------------------------------------------  Pulmonary veins:  Common pulmonary  vein:  The Doppler velocity and flow profile were  normal.   -------------------------------------------------------------------  Right ventricle:  The cavity size was normal. Wall thickness was  normal. Systolic function was normal.   -------------------------------------------------------------------  Pulmonic valve:    Structurally normal valve.   Cusp separation was  normal.  Doppler:  Transvalvular velocity was within the normal  range. There was no evidence for stenosis. There was no  regurgitation.   -------------------------------------------------------------------  Tricuspid valve:   Structurally normal valve.   Leaflet separation  was normal.  Doppler:  Transvalvular velocity was within the normal  range. There was no regurgitation.   -------------------------------------------------------------------  Pulmonary artery:   The main pulmonary artery was normal-sized.  Systolic pressure was within the normal range.   -------------------------------------------------------------------  Right atrium:  The atrium was normal in size.   -------------------------------------------------------------------  Pericardium: The pericardium was normal in appearance. There was  no pericardial effusion.   -------------------------------------------------------------------  Systemic veins:  Inferior vena cava: The vessel was normal in size. The  respirophasic diameter changes were in the normal range (>= 50%),  consistent with normal central venous pressure.   -------------------------------------------------------------------  Post procedure conclusions  Ascending Aorta:   - The aorta was normal, not dilated, and non-diseased.   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    Hypertension    Hypokalemia 11/02/2020   Irregular menstrual cycle    Irregular periods/menstrual cycles 05/03/2017   Will hold off on Provera or other fertility stuff for now, pt advised pregnancy is not recommended until BP under better control at least, plus other workup pen   Lumbar radiculopathy 09/22/2019   Polycystic ovaries 11/09/2015   Proteinuria 02/28/2013   Proteinuria 02/28/2013   Stage 2 chronic kidney disease 04/27/2020   SVT (supraventricular tachycardia) 11/01/2020   Systolic murmur 07/16/2017   Tobacco abuse 02/28/2013   Uncontrolled hypertension 05/10/2017    No past surgical history on file.    OB History     Gravida  1   Para      Term      Preterm      AB      Living         SAB      IAB      Ectopic      Multiple      Live Births                  Current Medications: Current Meds  Medication Sig   Accu-Chek Softclix Lancets lancets 100 each by Other route 4 (four) times daily. Use as instructed   acetaminophen (TYLENOL) 500 MG tablet Take 1,000 mg by mouth every 6 (six) hours as needed for mild pain.    albuterol (VENTOLIN HFA) 108 (90 Base) MCG/ACT inhaler Inhale 2 puffs into the lungs every 6 (six) hours as needed for wheezing or shortness of breath.   amLODipine (NORVASC) 10 MG tablet Take 1 tablet by mouth once daily  aspirin EC 81 MG tablet Take 2 tablets (162 mg total) by mouth daily. Swallow whole.   Continuous Glucose Sensor (FREESTYLE LIBRE 3 SENSOR) MISC Place 1 sensor on the skin every 14 days. Use to check glucose continuously   Cranberry 500 MG CAPS Take 1 capsule by mouth daily.   ferrous sulfate 325 (65 FE) MG tablet Take by mouth.   glucose  blood test strip Use as instructed   ipratropium (ATROVENT) 0.06 % nasal spray Place 2 sprays into both nostrils 4 (four) times daily. As needed for runny nose / postnasal drip   labetalol (NORMODYNE) 100 MG tablet Take 1 tablet (100 mg total) by mouth 3 (three) times daily.   magnesium oxide (MAG-OX) 400 MG tablet Take 0.5 tablets by mouth daily.   metFORMIN (GLUCOPHAGE) 500 MG tablet Take two tablets in the morning and one tablet at night.   metoCLOPramide (REGLAN) 10 MG tablet Take 1 tablet (10 mg total) by mouth 3 (three) times daily before meals.   Omega-3 Fatty Acids (FISH OIL) 1000 MG CAPS Take by mouth.   ondansetron (ZOFRAN) 8 MG tablet Take 1 tablet (8 mg total) by mouth every 8 (eight) hours as needed for nausea or vomiting.   Prenatal Vit-Fe Fumarate-FA (MULTIVITAMIN-PRENATAL) 27-0.8 MG TABS tablet Take 1 tablet by mouth daily at 12 noon.   [DISCONTINUED] labetalol (NORMODYNE) 200 MG tablet Take 1 tablet (200 mg total) by mouth every 8 (eight) hours.     Allergies:   Ibuprofen   Social History   Socioeconomic History   Marital status: Married    Spouse name: Ivar Drape   Number of children: 0   Years of education: Not on file   Highest education level: Not on file  Occupational History    Employer: UNEMPLOYED  Tobacco Use   Smoking status: Former    Packs/day: 0.25    Years: 5.00    Additional pack years: 0.00    Total pack years: 1.25    Types: Cigarettes    Quit date: 05/16/2021    Years since quitting: 1.1   Smokeless tobacco: Never   Tobacco comments:    Quit smoking May 18, 2023after her mom passed away  Vaping Use   Vaping Use: Never used  Substance and Sexual Activity   Alcohol use: Yes    Comment: "occasional use; social drinker"   Drug use: No   Sexual activity: Yes    Partners: Male    Birth control/protection: None    Comment: Pregnant  Other Topics Concern   Not on file  Social History Narrative   Not on file   Social Determinants of Health    Financial Resource Strain: Not on file  Food Insecurity: Not on file  Transportation Needs: Not on file  Physical Activity: Not on file  Stress: Not on file  Social Connections: Not on file      Family History  Problem Relation Age of Onset   Diabetes Mother    High blood pressure Mother    Renal Disease Mother    Diabetes Sister    Cancer Maternal Grandmother    Diabetes Maternal Grandmother    High blood pressure Maternal Grandmother    Cancer Maternal Aunt        breast   Other Father       ROS:   Please see the history of present illness.     All other systems reviewed and are negative.   Labs/EKG Reviewed:    EKG:  EKG is was not ordered today.    Recent Labs: 05/23/2022: ALT 24; BUN 9; Creatinine, Ser 1.16; Potassium 3.6; Sodium 135 06/23/2022: Hemoglobin 11.8; Platelet Count 321   Recent Lipid Panel Lab Results  Component Value Date/Time   CHOL 240 (H) 11/15/2021 12:00 AM   TRIG 492 (H) 11/15/2021 12:00 AM   HDL 40 (L) 11/15/2021 12:00 AM   CHOLHDL 6.0 (H) 11/15/2021 12:00 AM   LDLCALC  11/15/2021 12:00 AM     Comment:     . LDL cholesterol not calculated. Triglyceride levels greater than 400 mg/dL invalidate calculated LDL results. . Reference range: <100 . Desirable range <100 mg/dL for primary prevention;   <70 mg/dL for patients with CHD or diabetic patients  with > or = 2 CHD risk factors. [name] LDL-C is now calculated using the Martin-Hopkins  calculation, which is a validated novel method providing  better accuracy than the Friedewald equation in the  estimation of LDL-C.  Horald Pollen et al. Lenox Ahr. 4782;956(21): 2061-2068  (http://education.[employer].com/faq/FAQ164)     Physical Exam:    VS:  BP (!) 144/86 (BP Location: Left Arm)   Pulse 87   Ht 5\' 5"  (1.651 m)   Wt 197 lb 12.8 oz (89.7 kg)   LMP 01/18/2022   SpO2 98%   BMI 32.92 kg/m     Wt Readings from Last 3 Encounters:  07/03/22 197 lb 12.8 oz (89.7 kg)  06/30/22 196  lb 3.2 oz (89 kg)  06/23/22 195 lb 6.4 oz (88.6 kg)     GEN:  Well nourished, well developed in no acute distress HEENT: Normal NECK: No JVD; No carotid bruits LYMPHATICS: No lymphadenopathy CARDIAC: RRR, no murmurs, rubs, gallops RESPIRATORY:  Clear to auscultation without rales, wheezing or rhonchi  ABDOMEN: Soft, non-tender, non-distended MUSCULOSKELETAL:  No edema; No deformity  SKIN: Warm and dry NEUROLOGIC:  Alert and oriented x 3 PSYCHIATRIC:  Normal affect    Risk Assessment/Risk Calculators:                  ASSESSMENT & PLAN:    Chronic hypertension in pregnancy  Obesity  SVT Advanced maternal age  Blood pressure is not yet at target in the office today.  Will increase the labetalol to 300 mg 3 times daily.  Continue the amlodipine.  Diabetes mellitus is being managed by the [facility] team as well as our cardio-OB pharmacist who has been following with the patient.  I will see her in 4 weeks.   The patient understands the need to lose weight with diet and exercise. We have discussed specific strategies for this.  Patient Instructions  Medication Instructions:  Your physician has recommended you make the following change in your medication:  INCREASE: Labetalol 300 mg three times daily  *If you need a refill on your cardiac medications before your next appointment, please call your pharmacy*   Lab Work: None   Testing/Procedures: None   Follow-Up: At [facility], you and your health needs are our priority.  As part of our continuing mission to provide you with exceptional heart care, we have created designated Provider Care Teams.  These Care Teams include your primary Cardiologist (physician) and Advanced Practice Providers (APPs -  Physician Assistants and Nurse Practitioners) who all work together to provide you with the care you need, when you need it.   Your next appointment:   4 week(s) okay to overbook  Provider:   Thomasene Ripple, DO     Dispo:  No follow-ups on file.   Medication Adjustments/Labs and Tests Ordered: Current medicines are reviewed at length with the patient today.  Concerns regarding medicines are outlined above.  Tests Ordered: Orders Placed This Encounter  Procedures   EKG 12-Lead   Medication Changes: Meds ordered this encounter  Medications   labetalol (NORMODYNE) 100 MG tablet    Sig: Take 1 tablet (100 mg total) by mouth 3 (three) times daily.    Dispense:  270 tablet    Refill:  3

## 2022-07-03 NOTE — Patient Instructions (Addendum)
Medication Instructions:  Your physician has recommended you make the following change in your medication:  INCREASE: Labetalol 300 mg three times daily  *If you need a refill on your cardiac medications before your next appointment, please call your pharmacy*   Lab Work: None   Testing/Procedures: None   Follow-Up: At [facility], you and your health needs are our priority.  As part of our continuing mission to provide you with exceptional heart care, we have created designated Provider Care Teams.  These Care Teams include your primary Cardiologist (physician) and Advanced Practice Providers (APPs -  Physician Assistants and Nurse Practitioners) who all work together to provide you with the care you need, when you need it.   Your next appointment:   4 week(s) okay to overbook  Provider:   Thomasene Ripple, DO

## 2022-07-09 ENCOUNTER — Encounter (HOSPITAL_COMMUNITY): Payer: Self-pay | Admitting: Obstetrics and Gynecology

## 2022-07-09 ENCOUNTER — Inpatient Hospital Stay (HOSPITAL_COMMUNITY)
Admission: AD | Admit: 2022-07-09 | Discharge: 2022-07-09 | Disposition: A | Discharge status: Home / Self Care | Payer: BC Managed Care – PPO | Attending: Obstetrics and Gynecology | Admitting: Obstetrics and Gynecology

## 2022-07-09 ENCOUNTER — Other Ambulatory Visit: Payer: Self-pay

## 2022-07-09 DIAGNOSIS — Z79899 Other long term (current) drug therapy: Secondary | ICD-10-CM | POA: Insufficient documentation

## 2022-07-09 DIAGNOSIS — R809 Proteinuria, unspecified: Secondary | ICD-10-CM | POA: Diagnosis not present

## 2022-07-09 DIAGNOSIS — O10912 Unspecified pre-existing hypertension complicating pregnancy, second trimester: Secondary | ICD-10-CM

## 2022-07-09 DIAGNOSIS — R109 Unspecified abdominal pain: Secondary | ICD-10-CM | POA: Insufficient documentation

## 2022-07-09 DIAGNOSIS — Z3A24 24 weeks gestation of pregnancy: Secondary | ICD-10-CM | POA: Insufficient documentation

## 2022-07-09 DIAGNOSIS — I10 Essential (primary) hypertension: Secondary | ICD-10-CM | POA: Diagnosis not present

## 2022-07-09 DIAGNOSIS — O26892 Other specified pregnancy related conditions, second trimester: Secondary | ICD-10-CM | POA: Insufficient documentation

## 2022-07-09 DIAGNOSIS — O10919 Unspecified pre-existing hypertension complicating pregnancy, unspecified trimester: Secondary | ICD-10-CM

## 2022-07-09 DIAGNOSIS — O10012 Pre-existing essential hypertension complicating pregnancy, second trimester: Secondary | ICD-10-CM | POA: Insufficient documentation

## 2022-07-09 HISTORY — DX: Gestational diabetes mellitus in pregnancy, unspecified control: O24.419

## 2022-07-09 HISTORY — DX: Anemia, unspecified: D64.9

## 2022-07-09 LAB — COMPREHENSIVE METABOLIC PANEL
ALT: 19 U/L (ref 0–44)
AST: 21 U/L (ref 15–41)
Albumin: 2.7 g/dL — ABNORMAL LOW (ref 3.5–5.0)
Alkaline Phosphatase: 54 U/L (ref 38–126)
Anion gap: 12 (ref 5–15)
BUN: 9 mg/dL (ref 6–20)
CO2: 20 mmol/L — ABNORMAL LOW (ref 22–32)
Calcium: 9.5 mg/dL (ref 8.9–10.3)
Chloride: 102 mmol/L (ref 98–111)
Creatinine, Ser: 1 mg/dL (ref 0.44–1.00)
GFR, Estimated: >60 mL/min (ref >60)
Glucose, Bld: 87 mg/dL (ref 70–99)
Potassium: 3.7 mmol/L (ref 3.5–5.1)
Sodium: 134 mmol/L — ABNORMAL LOW (ref 135–145)
Total Bilirubin: 0.4 mg/dL (ref 0.3–1.2)
Total Protein: 6.3 g/dL — ABNORMAL LOW (ref 6.5–8.1)

## 2022-07-09 LAB — CBC
HCT: 33.9 % — ABNORMAL LOW (ref 36.0–46.0)
Hemoglobin: 11.8 g/dL — ABNORMAL LOW (ref 12.0–15.0)
MCH: 32.2 pg (ref 26.0–34.0)
MCHC: 34.8 g/dL (ref 30.0–36.0)
MCV: 92.4 fL (ref 80.0–100.0)
Platelets: 330 10*3/uL (ref 150–400)
RBC: 3.67 MIL/uL — ABNORMAL LOW (ref 3.87–5.11)
RDW: 12.9 % (ref 11.5–15.5)
WBC: 12.5 10*3/uL — ABNORMAL HIGH (ref 4.0–10.5)
nRBC: 0 % (ref 0.0–0.2)

## 2022-07-09 LAB — URINALYSIS, ROUTINE W REFLEX MICROSCOPIC
Bilirubin Urine: NEGATIVE
Glucose, UA: NEGATIVE mg/dL
Hgb urine dipstick: NEGATIVE
Ketones, ur: NEGATIVE mg/dL
Nitrite: NEGATIVE
Protein, ur: 100 mg/dL — AB
Specific Gravity, Urine: 1.015 (ref 1.005–1.030)
pH: 6 (ref 5.0–8.0)

## 2022-07-09 LAB — PROTEIN / CREATININE RATIO, URINE
Creatinine, Urine: 117 mg/dL
Protein Creatinine Ratio: 0.86 mg/mg{Cre} — ABNORMAL HIGH (ref 0.00–0.15)
Total Protein, Urine: 101 mg/dL

## 2022-07-09 MED ORDER — LABETALOL HCL 5 MG/ML IV SOLN: 20.0000 mg | INTRAVENOUS | Status: DC | PRN

## 2022-07-09 MED ORDER — LABETALOL HCL 5 MG/ML IV SOLN: 40.0000 mg | INTRAVENOUS | Status: DC | PRN

## 2022-07-09 MED ORDER — HYDRALAZINE HCL 20 MG/ML IJ SOLN: 10.0000 mg | INTRAMUSCULAR | Status: DC | PRN

## 2022-07-09 MED ORDER — LABETALOL HCL 5 MG/ML IV SOLN: 80.0000 mg | INTRAVENOUS | Status: DC | PRN

## 2022-07-09 MED ORDER — LABETALOL HCL 200 MG PO TABS: 400.0000 mg | ORAL_TABLET | Freq: Three times a day (TID) | ORAL | 0 refills | Status: DC

## 2022-07-09 NOTE — MAU Note (Signed)
Selena Meyer is a 37 y.o. at [redacted]w[redacted]d here in MAU reporting: ems arrival complaining of ctx and pressure in vagina/rectum that started at 1500 today. Denies any LOF or VB. Endorses +FM Last sexual intercourse yesterday. CHTN on Labetolol TID. Elevated BP 190/110 & 148/98 GDM on Metformin CBG at home 89 CBG on EMS truck 77 Reports nausea and vomiting today.  Onset of complaint: 1500 Pain score: 8 with contractions  Vitals:   07/09/22 1828  BP: (!) 163/94  Pulse: 86  Resp: 18  Temp: 98.4 F (36.9 C)     FHT:155 Lab orders placed from triage:

## 2022-07-09 NOTE — MAU Provider Note (Signed)
History     [id number]  Arrival date and time: 07/09/22 1823    Chief Complaint  Patient presents with   Contractions   vaginal pressure     HPI Selena Meyer is a 37 y.o. G1P0 at [redacted]w[redacted]d who presents via EMS for abdominal pain. Symptoms started a 3 pm. Reports intermittent abdominal pain & pelvic pressure. Occurs several times per hour. Last felt pain on the ambulance. Denies n/v/d, dysuria, vaginal bleeding, or LOF. Last intercourse was yesterday. Reports good fetal movement.  Chronic hypertension on labetalol 300 TID & norvasc 10. Hasn't missed any doses in the past week. Denies headache, visual disturbance, or epigastric pain.   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    Gestational diabetes    Hypertension    Hypokalemia 11/02/2020   Irregular menstrual cycle    Irregular periods/menstrual cycles 05/03/2017   Will hold off on Provera or other fertility stuff for now, pt advised pregnancy is not recommended until BP under better control at least, plus other workup pen   Lumbar radiculopathy 09/22/2019   Polycystic ovaries 11/09/2015   Proteinuria 02/28/2013   Stage 2 chronic kidney disease 04/27/2020   SVT (supraventricular tachycardia) 11/01/2020   Systolic murmur 07/16/2017   Tobacco abuse 02/28/2013   Uncontrolled hypertension 05/10/2017    Past Surgical History:  Procedure Laterality Date   NO PAST SURGERIES      Family History  Problem Relation Age of  Onset   Diabetes Mother    High blood pressure Mother    Renal Disease Mother    Diabetes Sister    Cancer Maternal Grandmother    Diabetes Maternal Grandmother    High blood pressure Maternal Grandmother    Cancer Maternal Aunt        breast   Other Father     Social History   Socioeconomic History   Marital status: Married    Spouse name: Ivar Drape   Number of children: 0   Years of education: Not on file   Highest education level: Not on file  Occupational History    Employer: UNEMPLOYED  Tobacco Use   Smoking status: Former    Packs/day: 0.25    Years: 5.00    Additional pack years: 0.00    Total pack years: 1.25    Types: Cigarettes    Quit date: 05/16/2021    Years since quitting: 1.1   Smokeless tobacco: Never   Tobacco comments:    Quit smoking 2023-05-19after her mom passed away  Vaping Use   Vaping Use: Never used  Substance and Sexual Activity   Alcohol use: Yes    Comment: "occasional use; social drinker"   Drug use: No  Sexual activity: Yes    Partners: Male    Birth control/protection: None    Comment: Pregnant  Other Topics Concern   Not on file  Social History Narrative   Not on file   Social Determinants of Health   Financial Resource Strain: Not on file  Food Insecurity: Not on file  Transportation Needs: Not on file  Physical Activity: Not on file  Stress: Not on file  Social Connections: Not on file  Intimate Partner Violence: Not on file    Allergies  Allergen Reactions   Ibuprofen Hives    No current facility-administered medications on file prior to encounter.   Current Outpatient Medications on File Prior to Encounter  Medication Sig Dispense Refill   Accu-Chek Softclix Lancets lancets 100 each by Other route 4 (four) times daily. Use as instructed 100 each 12   acetaminophen (TYLENOL) 500 MG tablet Take 1,000 mg by mouth every 6 (six) hours as needed for mild pain.      amLODipine (NORVASC) 10 MG tablet Take 1 tablet by mouth  once daily 90 tablet 0   aspirin EC 81 MG tablet Take 2 tablets (162 mg total) by mouth daily. Swallow whole. 30 tablet 12   Cranberry 500 MG CAPS Take 1 capsule by mouth daily.     ferrous sulfate 325 (65 FE) MG tablet Take by mouth.     magnesium oxide (MAG-OX) 400 MG tablet Take 0.5 tablets by mouth daily.     metFORMIN (GLUCOPHAGE) 500 MG tablet Take two tablets in the morning and one tablet at night. 90 tablet 5   Omega-3 Fatty Acids (FISH OIL) 1000 MG CAPS Take by mouth.     Prenatal Vit-Fe Fumarate-FA (MULTIVITAMIN-PRENATAL) 27-0.8 MG TABS tablet Take 1 tablet by mouth daily at 12 noon.     albuterol (VENTOLIN HFA) 108 (90 Base) MCG/ACT inhaler Inhale 2 puffs into the lungs every 6 (six) hours as needed for wheezing or shortness of breath. 1 each 1   Continuous Glucose Sensor (FREESTYLE LIBRE 3 SENSOR) MISC Place 1 sensor on the skin every 14 days. Use to check glucose continuously 2 each 6   glucose blood test strip Use as instructed 100 each 12   ipratropium (ATROVENT) 0.06 % nasal spray Place 2 sprays into both nostrils 4 (four) times daily. As needed for runny nose / postnasal drip 15 mL 1   metoCLOPramide (REGLAN) 10 MG tablet Take 1 tablet (10 mg total) by mouth 3 (three) times daily before meals. 30 tablet 0   ondansetron (ZOFRAN) 8 MG tablet Take 1 tablet (8 mg total) by mouth every 8 (eight) hours as needed for nausea or vomiting. 30 tablet 1     ROS Pertinent positives and negative per HPI, all others reviewed and negative  Physical Exam   BP (!) 148/86   Pulse 82   Temp 98.4 F (36.9 C) (Oral)   Resp 17   LMP 01/18/2022   SpO2 96%   Patient Vitals for the past 24 hrs:  BP Temp Temp src Pulse Resp SpO2  07/09/22 2054 (!) 148/86 -- -- 82 -- --  07/09/22 2015 (!) 144/78 -- -- 85 17 --  07/09/22 2001 (!) 140/80 -- -- 79 17 --  07/09/22 1946 (!) 141/82 -- -- 80 -- --  07/09/22 1931 (!) 145/92 -- -- 87 16 --  07/09/22 1916 (!) 149/94 -- -- 89 -- --  07/09/22 1851 --  -- -- -- -- 96 %  07/09/22 1846 [name])  157/101 -- -- 86 -- 95 %  07/09/22 1840 -- -- -- -- -- 95 %  07/09/22 1836 -- -- -- -- -- 95 %  07/09/22 1835 -- -- -- -- -- 95 %  07/09/22 1831 -- -- -- -- -- 95 %  07/09/22 1828 (!) 163/94 98.4 F (36.9 C) Oral 86 18 --    Physical Exam Vitals and nursing note reviewed. Exam conducted with a chaperone present.  Constitutional:      General: She is not in acute distress.    Appearance: Normal appearance.  HENT:     Head: Normocephalic and atraumatic.  Eyes:     General: No scleral icterus.    Conjunctiva/sclera: Conjunctivae normal.  Pulmonary:     Effort: Pulmonary effort is normal. No respiratory distress.  Neurological:     Mental Status: She is alert.  Psychiatric:        Mood and Affect: Mood normal.        Behavior: Behavior normal.      Cervical Exam Dilation: Closed Effacement (%): Thick Cervical Position: Middle Station: -3 Exam by:: Judeth Horn NP  FHT Baseline 150, moderate variability, 10x10 accels, no decels Toco: none Cat: 1  Labs Results for orders placed or performed during the hospital encounter of 07/09/22 (from the past 24 hour(s))  Protein / creatinine ratio, urine     Status: Abnormal   Collection Time: 07/09/22  6:58 PM  Result Value Ref Range   Creatinine, Urine 117 mg/dL   Total Protein, Urine 101 mg/dL   Protein Creatinine Ratio 0.86 (H) 0.00 - 0.15 mg/mg[Cre]  Urinalysis, Routine w reflex microscopic -Urine, Clean Catch     Status: Abnormal   Collection Time: 07/09/22  6:59 PM  Result Value Ref Range   Color, Urine YELLOW YELLOW   APPearance HAZY (A) CLEAR   Specific Gravity, Urine 1.015 1.005 - 1.030   pH 6.0 5.0 - 8.0   Glucose, UA NEGATIVE NEGATIVE mg/dL   Hgb urine dipstick NEGATIVE NEGATIVE   Bilirubin Urine NEGATIVE NEGATIVE   Ketones, ur NEGATIVE NEGATIVE mg/dL   Protein, ur 782 (A) NEGATIVE mg/dL   Nitrite NEGATIVE NEGATIVE   Leukocytes,Ua TRACE (A) NEGATIVE   RBC / HPF 0-5 0 - 5  RBC/hpf   WBC, UA 0-5 0 - 5 WBC/hpf   Bacteria, UA RARE (A) NONE SEEN   Squamous Epithelial / HPF 6-10 0 - 5 /HPF   Mucus PRESENT   Comprehensive metabolic panel     Status: Abnormal   Collection Time: 07/09/22  7:31 PM  Result Value Ref Range   Sodium 134 (L) 135 - 145 mmol/L   Potassium 3.7 3.5 - 5.1 mmol/L   Chloride 102 98 - 111 mmol/L   CO2 20 (L) 22 - 32 mmol/L   Glucose, Bld 87 70 - 99 mg/dL   BUN 9 6 - 20 mg/dL   Creatinine, Ser 9.56 0.44 - 1.00 mg/dL   Calcium 9.5 8.9 - 21.3 mg/dL   Total Protein 6.3 (L) 6.5 - 8.1 g/dL   Albumin 2.7 (L) 3.5 - 5.0 g/dL   AST 21 15 - 41 U/L   ALT 19 0 - 44 U/L   Alkaline Phosphatase 54 38 - 126 U/L   Total Bilirubin 0.4 0.3 - 1.2 mg/dL   GFR, Estimated >08 >65 mL/min   Anion gap 12 5 - 15  CBC     Status: Abnormal   Collection Time: 07/09/22  7:31 PM  Result Value Ref  Range   WBC 12.5 (H) 4.0 - 10.5 K/uL   RBC 3.67 (L) 3.87 - 5.11 MIL/uL   Hemoglobin 11.8 (L) 12.0 - 15.0 g/dL   HCT 16.1 (L) 09.6 - 04.5 %   MCV 92.4 80.0 - 100.0 fL   MCH 32.2 26.0 - 34.0 pg   MCHC 34.8 30.0 - 36.0 g/dL   RDW 40.9 81.1 - 91.4 %   Platelets 330 150 - 400 K/uL   nRBC 0.0 0.0 - 0.2 %    Imaging No results found.  MAU Course  Procedures Lab Orders         Culture, OB Urine         Comprehensive metabolic panel         CBC         Protein / creatinine ratio, urine         Urinalysis, Routine w reflex microscopic -Urine, Clean Catch     Meds ordered this encounter  Medications   AND Linked Order Group    labetalol (NORMODYNE) injection 20 mg    labetalol (NORMODYNE) injection 40 mg    labetalol (NORMODYNE) injection 80 mg    hydrALAZINE (APRESOLINE) injection 10 mg   labetalol (NORMODYNE) 200 MG tablet    Sig: Take 2 tablets (400 mg total) by mouth 3 (three) times daily.    Dispense:  180 tablet    Refill:  0    Order Specific Question:   Supervising Provider    Answer:   [employer] [[id number]]   Imaging Orders  No imaging studies  ordered today    MDM moderate  Assessment and Plan   1. Chronic hypertension during pregnancy, antepartum  -Intake BP severe range. BPs improved without intervention. Patient asymptomatic. PCR elevated but patient has preexisting proteinuria. LFTs & platelets normal. Creatinine 1.00 which is slightly decreased from last month.  -Consulted with Dr. Macon Large - will increase labetalol to 400 mg TID, continue norvasc at 10  2. Abdominal pain during pregnancy in second trimester  -Cervix closed/thick. No contractions on monitor.  -U/a with trace leuks. No urinary complaints. Sent for culture.   3. [redacted] weeks gestation of pregnancy     #FWB: cat 1 tracing    Dispo: discharged to home in stable condition.   Discharge Instructions     Discharge patient   Complete by: As directed    Discharge disposition: 01-Home or Self Care   Discharge patient date: 07/09/2022       Judeth Horn, NP 07/09/22 9:19 PM  Allergies as of 07/09/2022       Reactions   Ibuprofen Hives        Medication List     TAKE these medications    Accu-Chek Softclix Lancets lancets 100 each by Other route 4 (four) times daily. Use as instructed   acetaminophen 500 MG tablet Commonly known as: TYLENOL Take 1,000 mg by mouth every 6 (six) hours as needed for mild pain.   albuterol 108 (90 Base) MCG/ACT inhaler Commonly known as: VENTOLIN HFA Inhale 2 puffs into the lungs every 6 (six) hours as needed for wheezing or shortness of breath.   amLODipine 10 MG tablet Commonly known as: NORVASC Take 1 tablet by mouth once daily   aspirin EC 81 MG tablet Take 2 tablets (162 mg total) by mouth daily. Swallow whole.   Cranberry 500 MG Caps Take 1 capsule by mouth daily.   ferrous sulfate 325 (65 FE) MG tablet Take by mouth.  Fish Oil 1000 MG Caps Take by mouth.   FreeStyle Libre 3 Sensor Misc Place 1 sensor on the skin every 14 days. Use to check glucose continuously   glucose blood test  strip Use as instructed   ipratropium 0.06 % nasal spray Commonly known as: ATROVENT Place 2 sprays into both nostrils 4 (four) times daily. As needed for runny nose / postnasal drip   labetalol 200 MG tablet Commonly known as: NORMODYNE Take 2 tablets (400 mg total) by mouth 3 (three) times daily. What changed:  medication strength how much to take   magnesium oxide 400 MG tablet Commonly known as: MAG-OX Take 0.5 tablets by mouth daily.   metFORMIN 500 MG tablet Commonly known as: GLUCOPHAGE Take two tablets in the morning and one tablet at night.   metoCLOPramide 10 MG tablet Commonly known as: REGLAN Take 1 tablet (10 mg total) by mouth 3 (three) times daily before meals.   multivitamin-prenatal 27-0.8 MG Tabs tablet Take 1 tablet by mouth daily at 12 noon.   ondansetron 8 MG tablet Commonly known as: Zofran Take 1 tablet (8 mg total) by mouth every 8 (eight) hours as needed for nausea or vomiting.

## 2022-07-10 DIAGNOSIS — Z419 Encounter for procedure for purposes other than remedying health state, unspecified: Secondary | ICD-10-CM | POA: Diagnosis not present

## 2022-07-11 ENCOUNTER — Ambulatory Visit (INDEPENDENT_AMBULATORY_CARE_PROVIDER_SITE_OTHER): Payer: BC Managed Care – PPO | Admitting: Obstetrics and Gynecology

## 2022-07-11 VITALS — BP 141/86 | HR 80 | Wt 194.0 lb

## 2022-07-11 DIAGNOSIS — O09512 Supervision of elderly primigravida, second trimester: Secondary | ICD-10-CM

## 2022-07-11 DIAGNOSIS — O24415 Gestational diabetes mellitus in pregnancy, controlled by oral hypoglycemic drugs: Secondary | ICD-10-CM

## 2022-07-11 DIAGNOSIS — I517 Cardiomegaly: Secondary | ICD-10-CM

## 2022-07-11 DIAGNOSIS — O0990 Supervision of high risk pregnancy, unspecified, unspecified trimester: Secondary | ICD-10-CM

## 2022-07-11 DIAGNOSIS — O10919 Unspecified pre-existing hypertension complicating pregnancy, unspecified trimester: Secondary | ICD-10-CM

## 2022-07-11 DIAGNOSIS — O26839 Pregnancy related renal disease, unspecified trimester: Secondary | ICD-10-CM

## 2022-07-11 LAB — CULTURE, OB URINE: Special Requests: NORMAL

## 2022-07-11 NOTE — Progress Notes (Signed)
Pt denies headache/visual changes 

## 2022-07-11 NOTE — Progress Notes (Unsigned)
   PRENATAL VISIT NOTE  Subjective:  Selena Meyer is a 37 y.o. G1P0 at [redacted]w[redacted]d being seen today for ongoing prenatal care.  She is currently monitored for the following issues for this high-risk pregnancy and has FSGS (focal segmental glomerulosclerosis); IDA (iron deficiency anemia); Supervision of high risk pregnancy, antepartum; Gestational diabetes mellitus (GDM), antepartum; AMA (advanced maternal age) primigravida 29+; Chronic hypertension during pregnancy, antepartum; Kidney disease in pregnancy, unspecified trimester; Morbid obesity (HCC); Moderate left ventricular hypertrophy; and Known fetal anomaly, antepartum on their problem list.  Patient reports no complaints.  Contractions: Not present. Vag. Bleeding: None.  Movement: Present. Denies leaking of fluid.   The following portions of the patient's history were reviewed and updated as appropriate: allergies, current medications, past family history, past medical history, past social history, past surgical history and problem list.   Objective:   Vitals:   07/11/22 1557 07/11/22 1641  BP: (!) 153/91 (!) 141/86  Pulse: 85 80  Weight: 194 lb (88 kg)     Fetal Status: Fetal Heart Rate (bpm): 148   Movement: Present  Presentation: Transverse  General:  Alert, oriented and cooperative. Patient is in no acute distress.  Skin: Skin is warm and dry. No rash noted.   Cardiovascular: Normal heart rate noted  Respiratory: Normal respiratory effort, no problems with respiration noted  Abdomen: Soft, gravid, appropriate for gestational age.  Pain/Pressure: Absent     Pelvic: Cervical exam deferred        Extremities: Normal range of motion.  Edema: None  Mental Status: Normal mood and affect. Normal behavior. Normal judgment and thought content.   Assessment and Plan:  Pregnancy: G1P0 at [redacted]w[redacted]d There are no diagnoses linked to this encounter. Preterm labor symptoms and general obstetric precautions including but not limited to vaginal  bleeding, contractions, leaking of fluid and fetal movement were reviewed in detail with the patient. Please refer to After Visit Summary for other counseling recommendations.   No follow-ups on file.  Future Appointments  Date Time Provider Department Center  08/03/2022  1:50 PM Lennart Pall, MD [facility]  08/03/2022  3:15 PM [facility] NURSE [facility]  08/03/2022  3:30 PM [facility] US2 [facility]  08/07/2022 11:30 AM Tobb, Kardie, DO [facility] None  11/23/2022 11:00 AM [facility] None  11/23/2022 11:15 AM Covington, Brand Males, PA-C [facility] None    Albertine Grates, FNP

## 2022-07-21 DIAGNOSIS — O24415 Gestational diabetes mellitus in pregnancy, controlled by oral hypoglycemic drugs: Secondary | ICD-10-CM | POA: Diagnosis not present

## 2022-08-01 ENCOUNTER — Encounter: Payer: BC Managed Care – PPO | Admitting: Obstetrics and Gynecology

## 2022-08-03 ENCOUNTER — Other Ambulatory Visit: Payer: BC Managed Care – PPO

## 2022-08-03 ENCOUNTER — Ambulatory Visit: Payer: BC Managed Care – PPO | Attending: Obstetrics and Gynecology

## 2022-08-03 ENCOUNTER — Ambulatory Visit (INDEPENDENT_AMBULATORY_CARE_PROVIDER_SITE_OTHER): Payer: BC Managed Care – PPO | Admitting: Obstetrics and Gynecology

## 2022-08-03 ENCOUNTER — Ambulatory Visit: Payer: BC Managed Care – PPO | Admitting: *Deleted

## 2022-08-03 VITALS — BP 156/94 | HR 93 | Wt 196.0 lb

## 2022-08-03 VITALS — BP 146/88 | HR 88

## 2022-08-03 DIAGNOSIS — O99333 Smoking (tobacco) complicating pregnancy, third trimester: Secondary | ICD-10-CM

## 2022-08-03 DIAGNOSIS — O09513 Supervision of elderly primigravida, third trimester: Secondary | ICD-10-CM

## 2022-08-03 DIAGNOSIS — O0990 Supervision of high risk pregnancy, unspecified, unspecified trimester: Secondary | ICD-10-CM

## 2022-08-03 DIAGNOSIS — Z6832 Body mass index (BMI) 32.0-32.9, adult: Secondary | ICD-10-CM

## 2022-08-03 DIAGNOSIS — O26839 Pregnancy related renal disease, unspecified trimester: Secondary | ICD-10-CM | POA: Diagnosis not present

## 2022-08-03 DIAGNOSIS — Z3A28 28 weeks gestation of pregnancy: Secondary | ICD-10-CM

## 2022-08-03 DIAGNOSIS — O3509X0 Maternal care for (suspected) other central nervous system malformation or damage in fetus, not applicable or unspecified: Secondary | ICD-10-CM

## 2022-08-03 DIAGNOSIS — O99213 Obesity complicating pregnancy, third trimester: Secondary | ICD-10-CM

## 2022-08-03 DIAGNOSIS — Q048 Other specified congenital malformations of brain: Secondary | ICD-10-CM | POA: Insufficient documentation

## 2022-08-03 DIAGNOSIS — O359XX0 Maternal care for (suspected) fetal abnormality and damage, unspecified, not applicable or unspecified: Secondary | ICD-10-CM

## 2022-08-03 DIAGNOSIS — F17211 Nicotine dependence, cigarettes, in remission: Secondary | ICD-10-CM

## 2022-08-03 DIAGNOSIS — O99413 Diseases of the circulatory system complicating pregnancy, third trimester: Secondary | ICD-10-CM

## 2022-08-03 DIAGNOSIS — I517 Cardiomegaly: Secondary | ICD-10-CM

## 2022-08-03 DIAGNOSIS — O10919 Unspecified pre-existing hypertension complicating pregnancy, unspecified trimester: Secondary | ICD-10-CM | POA: Diagnosis not present

## 2022-08-03 DIAGNOSIS — E669 Obesity, unspecified: Secondary | ICD-10-CM

## 2022-08-03 DIAGNOSIS — O24415 Gestational diabetes mellitus in pregnancy, controlled by oral hypoglycemic drugs: Secondary | ICD-10-CM

## 2022-08-03 DIAGNOSIS — O10013 Pre-existing essential hypertension complicating pregnancy, third trimester: Secondary | ICD-10-CM | POA: Diagnosis not present

## 2022-08-03 DIAGNOSIS — O24419 Gestational diabetes mellitus in pregnancy, unspecified control: Secondary | ICD-10-CM | POA: Diagnosis not present

## 2022-08-03 DIAGNOSIS — O99013 Anemia complicating pregnancy, third trimester: Secondary | ICD-10-CM

## 2022-08-03 DIAGNOSIS — D649 Anemia, unspecified: Secondary | ICD-10-CM

## 2022-08-03 DIAGNOSIS — N051 Unspecified nephritic syndrome with focal and segmental glomerular lesions: Secondary | ICD-10-CM

## 2022-08-03 DIAGNOSIS — I471 Supraventricular tachycardia, unspecified: Secondary | ICD-10-CM

## 2022-08-03 DIAGNOSIS — D509 Iron deficiency anemia, unspecified: Secondary | ICD-10-CM

## 2022-08-03 DIAGNOSIS — O09512 Supervision of elderly primigravida, second trimester: Secondary | ICD-10-CM

## 2022-08-03 MED ORDER — METOCLOPRAMIDE HCL 10 MG PO TABS: 10.0000 mg | ORAL_TABLET | Freq: Four times a day (QID) | ORAL | 3 refills | Status: DC | PRN

## 2022-08-03 MED ORDER — OMEPRAZOLE 20 MG PO CPDR: 20.0000 mg | DELAYED_RELEASE_CAPSULE | Freq: Every day | ORAL | 1 refills | Status: DC

## 2022-08-03 MED ORDER — LABETALOL HCL 200 MG PO TABS
600.0000 mg | ORAL_TABLET | Freq: Three times a day (TID) | ORAL | 3 refills | Status: DC
Start: 2022-08-03 — End: 2022-08-30

## 2022-08-03 NOTE — Progress Notes (Signed)
PRENATAL VISIT NOTE  Subjective:  Selena Meyer is a 37 y.o. G1P0 at [redacted]w[redacted]d being seen today for ongoing prenatal care.  She is currently monitored for the following issues for this high-risk pregnancy and has FSGS (focal segmental glomerulosclerosis); IDA (iron deficiency anemia); Supervision of high risk pregnancy, antepartum; Gestational diabetes mellitus (GDM), antepartum; AMA (advanced maternal age) primigravida 89+; Chronic hypertension during pregnancy, antepartum; Kidney disease in pregnancy, unspecified trimester; Morbid obesity (HCC); Moderate left ventricular hypertrophy; and Known fetal anomaly, antepartum on their problem list.  Patient reports  feeling miserable. Reports persistent nausea/vomiting as well as feeling like something is stuck in her throat after she eats . Feeling uncomfortable/large in general. Denies HA, vis changes, RUQ/epigastric pain.  Contractions: Not present. Vag. Bleeding: None.   . Denies leaking of fluid.   The following portions of the patient's history were reviewed and updated as appropriate: allergies, current medications, past family history, past medical history, past social history, past surgical history and problem list.   Objective:   Vitals:   08/03/22 1403  BP: (!) 156/94  Pulse: 93  Weight: 196 lb (88.9 kg)   Fetal Status: Fetal Heart Rate (bpm): 151         General:  Alert, oriented and cooperative. Patient is in no acute distress.  Skin: Skin is warm and dry. No rash noted.   Cardiovascular: Normal heart rate noted  Respiratory: Normal respiratory effort, no problems with respiration noted  Abdomen: Soft, gravid, appropriate for gestational age.  Pain/Pressure: Absent      Assessment and Plan:  Pregnancy: G1P0 at [redacted]w[redacted]d 1. Supervision of high risk pregnancy, antepartum 2. [redacted] weeks gestation of pregnancy Defer tdap to next visit Rx for prilosec & reglan sent - HIV antibody (with reflex) - CBC - RPR  3. Chronic hypertension  during pregnancy, antepartum Reports home BP mostly 150s/90s on labetalol 400 TID and amlodipine 10mg  qAM Increase to labetalol 600mg  TID, continue amlodipine 10mg  PreE labs ordered for today given persistently elevated BP and GERD sxs Growth [place] today - Comp Met (CMET) - Protein / creatinine ratio, urine  4. FSGS (focal segmental glomerulosclerosis) 5. Kidney disease in pregnancy, unspecified trimester Cr 0.98-1.16 & PCR 0.57-1.286 this pregnancy  6. Moderate left ventricular hypertrophy 12. Supraventricular tachycardia F/w Dr. Servando Salina & [facility] cardiology, next appt 7/29  7. Known fetal anomaly, antepartum, single or unspecified fetus Mild ventriculomegaly, possible open neural tube defect Toxo neg, CMV IgG positive but IgM negative (not c/w acute infxn) Follow up US today - potential for fetal brain MRI depending on findings  8. Gestational diabetes mellitus (GDM) controlled on oral hypoglycemic drug, antepartum BG reviewed - fasting 86-92 with one 99, getting at most 1 postprandial value daily ranging from 89-127 Continue metformin 1000/500 Normal fetal echo  10. Primigravida of advanced maternal age in second trimester LR NIPS  11. BMI 32.0-32.9,adult  Please refer to After Visit Summary for other counseling recommendations.   Return in about 2 weeks (around 08/17/2022) for ROB at 30 weeks MD only.  Future Appointments  Date Time Provider Department Center  08/07/2022 11:30 AM Tobb, Lavona Mound, DO [facility] None  08/17/2022  4:10 PM Lennart Pall, MD [facility]  08/31/2022  1:45 PM [facility] NURSE [facility]  08/31/2022  2:00 PM [facility] US1 [facility]  08/31/2022  4:10 PM Lennart Pall, MD [facility]  09/07/2022  8:30 AM [facility] NURSE [facility]  09/07/2022  8:45 AM [facility] US5 [facility]  09/14/2022  8:30 AM [facility] NURSE [facility]  09/14/2022  8:45 AM [facility] US5 [facility]  09/21/2022 10:15 AM [facility] NURSE [facility]  09/21/2022 10:30 AM  [facility] US2 [facility]  09/28/2022  9:15 AM [facility] NURSE [facility]  09/28/2022  9:30 AM [facility] US3 [facility]  11/23/2022 11:00 AM [facility] None  11/23/2022 11:15 AM Covington, Brand Males, PA-C [facility] None    Lennart Pall, MD

## 2022-08-04 ENCOUNTER — Other Ambulatory Visit: Payer: Self-pay | Admitting: *Deleted

## 2022-08-04 DIAGNOSIS — O3509X0 Maternal care for (suspected) other central nervous system malformation or damage in fetus, not applicable or unspecified: Secondary | ICD-10-CM

## 2022-08-04 LAB — HIV ANTIBODY (ROUTINE TESTING W REFLEX): HIV Screen 4th Generation wRfx: NONREACTIVE

## 2022-08-05 ENCOUNTER — Encounter (HOSPITAL_COMMUNITY): Payer: Self-pay | Admitting: Obstetrics & Gynecology

## 2022-08-05 ENCOUNTER — Inpatient Hospital Stay (HOSPITAL_COMMUNITY)
Admission: AD | Admit: 2022-08-05 | Discharge: 2022-08-05 | Disposition: A | Discharge status: Home / Self Care | Payer: BC Managed Care – PPO | Attending: Obstetrics & Gynecology | Admitting: Obstetrics & Gynecology

## 2022-08-05 DIAGNOSIS — O09513 Supervision of elderly primigravida, third trimester: Secondary | ICD-10-CM | POA: Insufficient documentation

## 2022-08-05 DIAGNOSIS — N182 Chronic kidney disease, stage 2 (mild): Secondary | ICD-10-CM | POA: Insufficient documentation

## 2022-08-05 DIAGNOSIS — J988 Other specified respiratory disorders: Secondary | ICD-10-CM | POA: Diagnosis not present

## 2022-08-05 DIAGNOSIS — R112 Nausea with vomiting, unspecified: Secondary | ICD-10-CM | POA: Diagnosis not present

## 2022-08-05 DIAGNOSIS — U071 COVID-19: Secondary | ICD-10-CM | POA: Diagnosis not present

## 2022-08-05 DIAGNOSIS — O24415 Gestational diabetes mellitus in pregnancy, controlled by oral hypoglycemic drugs: Secondary | ICD-10-CM | POA: Insufficient documentation

## 2022-08-05 DIAGNOSIS — O10213 Pre-existing hypertensive chronic kidney disease complicating pregnancy, third trimester: Secondary | ICD-10-CM | POA: Insufficient documentation

## 2022-08-05 DIAGNOSIS — I129 Hypertensive chronic kidney disease with stage 1 through stage 4 chronic kidney disease, or unspecified chronic kidney disease: Secondary | ICD-10-CM | POA: Diagnosis not present

## 2022-08-05 DIAGNOSIS — Z3A28 28 weeks gestation of pregnancy: Secondary | ICD-10-CM | POA: Insufficient documentation

## 2022-08-05 DIAGNOSIS — O98513 Other viral diseases complicating pregnancy, third trimester: Secondary | ICD-10-CM | POA: Diagnosis not present

## 2022-08-05 DIAGNOSIS — O99513 Diseases of the respiratory system complicating pregnancy, third trimester: Secondary | ICD-10-CM | POA: Insufficient documentation

## 2022-08-05 DIAGNOSIS — R059 Cough, unspecified: Secondary | ICD-10-CM | POA: Diagnosis not present

## 2022-08-05 LAB — URINALYSIS, ROUTINE W REFLEX MICROSCOPIC
Bilirubin Urine: NEGATIVE
Glucose, UA: NEGATIVE mg/dL
Hgb urine dipstick: NEGATIVE
Ketones, ur: NEGATIVE mg/dL
Nitrite: NEGATIVE
Protein, ur: 100 mg/dL — AB
Specific Gravity, Urine: 1.016 (ref 1.005–1.030)
pH: 7 (ref 5.0–8.0)

## 2022-08-05 LAB — CBC WITH DIFFERENTIAL/PLATELET
Abs Immature Granulocytes: 0.07 10*3/uL (ref 0.00–0.07)
Basophils Absolute: 0.1 10*3/uL (ref 0.0–0.1)
Basophils Relative: 0 %
Eosinophils Absolute: 0.1 10*3/uL (ref 0.0–0.5)
Eosinophils Relative: 1 %
HCT: 31.1 % — ABNORMAL LOW (ref 36.0–46.0)
Hemoglobin: 10.7 g/dL — ABNORMAL LOW (ref 12.0–15.0)
Immature Granulocytes: 1 %
Lymphocytes Relative: 4 %
Lymphs Abs: 0.5 10*3/uL — ABNORMAL LOW (ref 0.7–4.0)
MCH: 30.7 pg (ref 26.0–34.0)
MCHC: 34.4 g/dL (ref 30.0–36.0)
MCV: 89.1 fL (ref 80.0–100.0)
Monocytes Absolute: 0.7 10*3/uL (ref 0.1–1.0)
Monocytes Relative: 6 %
Neutro Abs: 9.9 10*3/uL — ABNORMAL HIGH (ref 1.7–7.7)
Neutrophils Relative %: 88 %
Platelets: 291 10*3/uL (ref 150–400)
RBC: 3.49 MIL/uL — ABNORMAL LOW (ref 3.87–5.11)
RDW: 12.7 % (ref 11.5–15.5)
WBC: 11.3 10*3/uL — ABNORMAL HIGH (ref 4.0–10.5)
nRBC: 0 % (ref 0.0–0.2)

## 2022-08-05 LAB — COMPREHENSIVE METABOLIC PANEL
ALT: 15 U/L (ref 0–44)
AST: 18 U/L (ref 15–41)
Albumin: 2.4 g/dL — ABNORMAL LOW (ref 3.5–5.0)
Alkaline Phosphatase: 59 U/L (ref 38–126)
Anion gap: 11 (ref 5–15)
BUN: 6 mg/dL (ref 6–20)
CO2: 21 mmol/L — ABNORMAL LOW (ref 22–32)
Calcium: 8.8 mg/dL — ABNORMAL LOW (ref 8.9–10.3)
Chloride: 102 mmol/L (ref 98–111)
Creatinine, Ser: 1.05 mg/dL — ABNORMAL HIGH (ref 0.44–1.00)
GFR, Estimated: >60 mL/min (ref >60)
Glucose, Bld: 97 mg/dL (ref 70–99)
Potassium: 4.2 mmol/L (ref 3.5–5.1)
Sodium: 134 mmol/L — ABNORMAL LOW (ref 135–145)
Total Bilirubin: 0.5 mg/dL (ref 0.3–1.2)
Total Protein: 5.9 g/dL — ABNORMAL LOW (ref 6.5–8.1)

## 2022-08-05 LAB — RESP PANEL BY RT-PCR (RSV, FLU A&B, COVID)  RVPGX2
Influenza A by PCR: NEGATIVE
Influenza B by PCR: NEGATIVE
Resp Syncytial Virus by PCR: NEGATIVE
SARS Coronavirus 2 by RT PCR: POSITIVE — AB

## 2022-08-05 LAB — CREATININE, SERUM
Creatinine, Ser: 1.09 mg/dL — ABNORMAL HIGH (ref 0.44–1.00)
Creatinine, Ser: 1.05 mg/dL — ABNORMAL HIGH (ref 0.44–1.00)
GFR, Estimated: >60 mL/min (ref >60)
GFR, Estimated: >60 mL/min (ref >60)

## 2022-08-05 LAB — TROPONIN I (HIGH SENSITIVITY): Troponin I (High Sensitivity): 8 ng/L (ref <18)

## 2022-08-05 LAB — SARS CORONAVIRUS 2 BY RT PCR: SARS Coronavirus 2 by RT PCR: POSITIVE — AB

## 2022-08-05 MED ORDER — LACTATED RINGERS IV BOLUS
1000.0000 mL | Freq: Once | INTRAVENOUS | Status: AC
  Administered 2022-08-05: 1000 mL via INTRAVENOUS

## 2022-08-05 MED ORDER — SODIUM CHLORIDE 0.9 % IV SOLN
8.0000 mg | Freq: Once | INTRAVENOUS | Status: AC
  Administered 2022-08-05: 8 mg via INTRAVENOUS
  Filled 2022-08-05: qty 4

## 2022-08-05 MED ORDER — NIRMATRELVIR/RITONAVIR (PAXLOVID)TABLET
3.0000 | ORAL_TABLET | Freq: Two times a day (BID) | ORAL | Status: DC
  Administered 2022-08-05: 3 via ORAL
  Filled 2022-08-05: qty 30

## 2022-08-05 MED ORDER — GUAIFENESIN-CODEINE 100-10 MG/5ML PO SOLN: 10.0000 mL | Freq: Four times a day (QID) | ORAL | 0 refills | Status: DC | PRN

## 2022-08-05 MED ORDER — ONDANSETRON HCL 4 MG PO TABS: 4.0000 mg | ORAL_TABLET | Freq: Every day | ORAL | 0 refills | Status: AC | PRN

## 2022-08-05 MED ORDER — GUAIFENESIN-CODEINE 100-10 MG/5ML PO SOLN
10.0000 mL | Freq: Once | ORAL | Status: AC
  Administered 2022-08-05: 10 mL via ORAL
  Filled 2022-08-05: qty 10

## 2022-08-05 NOTE — MAU Note (Signed)
.  Selena Meyer is a 37 y.o. at [redacted]w[redacted]d here in MAU reporting: started coughing yesterday and feeling unwell. Had 2 positive COVID tests at home. Denies VB or LOF. +FM. Also feeling tight in her chest and has pain when she coughs.   Pain score: 6 Vitals:   08/05/22 0918  BP: 135/83  Pulse: (!) 105  Resp: 20  Temp: 98.9 F (37.2 C)  SpO2: 97%     FHT:150 Lab orders placed from triage:  UA

## 2022-08-05 NOTE — MAU Note (Signed)
Started having nausea and vomiting yesterday around 1000. Cough started last night, sometimes productive with yellow sputum. States has pain in chest when she coughs hard.

## 2022-08-05 NOTE — MAU Provider Note (Addendum)
MAU Provider Note  History  [id number]  Arrival date and time: 08/05/22 7829   Chief Complaint  Patient presents with   Nausea   Emesis   Chest Pain   Cough     HPI Selena Meyer is a 37 y.o. G1P0 at [redacted]w[redacted]d by LMP with PMHx notable for AMA, GDM, CHTN, kidney disease, who presents for possible COVID infection.  For the past 2 to 3 days patient has had coughing, vomiting, chest pressure, fatigue and malaise.  She took 3 COVID tests at home, 2 of which were positive.  Unknown sick contacts.  Declines headache, blurry vision, shortness of breath, significant abdominal pain.  Her last creatinine 2 days ago was 0.93 at her OB visit.   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    Gestational diabetes    Hypertension    Hypokalemia 11/02/2020   Irregular menstrual cycle    Irregular periods/menstrual cycles 05/03/2017   Will hold off on Provera or other fertility stuff for now, pt advised pregnancy is not recommended until BP under better control at least, plus other workup pen   Polycystic ovaries 11/09/2015   Proteinuria 02/28/2013   Stage 2 chronic kidney disease 04/27/2020   SVT (supraventricular tachycardia) 11/01/2020   Systolic murmur 07/16/2017   Tobacco abuse 02/28/2013   Uncontrolled hypertension 05/10/2017    Past Surgical History:  Procedure Laterality Date   NO PAST  SURGERIES      Family History  Problem Relation Age of Onset   Diabetes Mother    High blood pressure Mother    Renal Disease Mother    Diabetes Sister    Cancer Maternal Grandmother    Diabetes Maternal Grandmother    High blood pressure Maternal Grandmother    Cancer Maternal Aunt        breast   Other Father     Social History   Socioeconomic History   Marital status: Married    Spouse name: Ivar Drape   Number of children: 0   Years of education: Not on file   Highest education level: Not on file  Occupational History    Employer: UNEMPLOYED  Tobacco Use   Smoking status: Former    Current packs/day: 0.00    Average packs/day: 0.3 packs/day for 5.0 years (1.3 ttl pk-yrs)    Types: Cigarettes    Start date: 05/16/2016    Quit date: 05/16/2021    Years since quitting: 1.2   Smokeless tobacco: Never   Tobacco comments:    Quit smoking 05-09-23after her mom passed away  Vaping Use   Vaping status: Never Used  Substance and Sexual Activity   Alcohol use: Not Currently    Comment: "occasional use; social drinker"  Drug use: No   Sexual activity: Yes    Partners: Male    Birth control/protection: None    Comment: Pregnant  Other Topics Concern   Not on file  Social History Narrative   Not on file   Social Determinants of Health   Financial Resource Strain: Low Risk  (05/08/2022)   Received from [facility], [facility]   Overall Financial Resource Strain (CARDIA)    Difficulty of Paying Living Expenses: Not very hard  Food Insecurity: No Food Insecurity (05/08/2022)   Received from [facility], [facility]   Hunger Vital Sign    Worried About Running Out of Food in the Last Year: Never true    Ran Out of Food in the Last Year: Never true  Transportation Needs: No Transportation Needs (05/08/2022)   Received from [facility], [facility]   PRAPARE - Transportation    Lack of Transportation (Medical): No    Lack of Transportation (Non-Medical): No   Physical Activity: Unknown (11/03/2020)   Received from [facility], [facility]   Exercise Vital Sign    Days of Exercise per Week: 5 days    Minutes of Exercise per Session: Patient declined  Stress: Stress Concern Present (11/03/2020)   Received from [facility], [facility] - Occupational Stress Questionnaire    Feeling of Stress : To some extent  Social Connections: Unknown (05/10/2021)   Received from [facility], [facility]   Social Network    Social Network: Not on file  Intimate Partner Violence: Unknown (04/13/2021)   Received from [facility], [facility]   HITS    Physically Hurt: Not on file    Insult or Talk Down To: Not on file    Threaten Physical Harm: Not on file    Scream or Curse: Not on file    Allergies  Allergen Reactions   Ibuprofen Hives    No current facility-administered medications on file prior to encounter.   Current Outpatient Medications on File Prior to Encounter  Medication Sig Dispense Refill   amLODipine (NORVASC) 10 MG tablet Take 1 tablet by mouth once daily 90 tablet 0   aspirin EC 81 MG tablet Take 2 tablets (162 mg total) by mouth daily. Swallow whole. 30 tablet 12   ferrous sulfate 325 (65 FE) MG tablet Take by mouth.     labetalol (NORMODYNE) 200 MG tablet Take 3 tablets (600 mg total) by mouth 3 (three) times daily. 270 tablet 3   magnesium oxide (MAG-OX) 400 MG tablet Take 0.5 tablets by mouth daily.     metFORMIN (GLUCOPHAGE) 500 MG tablet Take two tablets in the morning and one tablet at night. 90 tablet 5   Omega-3 Fatty Acids (FISH OIL) 1000 MG CAPS Take by mouth.     Prenatal Vit-Fe Fumarate-FA (MULTIVITAMIN-PRENATAL) 27-0.8 MG TABS tablet Take 1 tablet by mouth daily at 12 noon.     Accu-Chek Softclix Lancets lancets 100 each by Other route 4 (four) times daily. Use as instructed 100 each 12   acetaminophen (TYLENOL) 500 MG tablet Take 1,000 mg by mouth every 6 (six)  hours as needed for mild pain.      albuterol (VENTOLIN HFA) 108 (90 Base) MCG/ACT inhaler Inhale 2 puffs into the lungs every 6 (six) hours as needed for wheezing or shortness of breath. 1 each 1   Continuous Glucose Sensor (FREESTYLE LIBRE 3 SENSOR) MISC Place 1 sensor on the skin every 14  days. Use to check glucose continuously 2 each 6   Cranberry 500 MG CAPS Take 1 capsule by mouth daily.     glucose blood test strip Use as instructed 100 each 12   ipratropium (ATROVENT) 0.06 % nasal spray Place 2 sprays into both nostrils 4 (four) times daily. As needed for runny nose / postnasal drip (Patient not taking: Reported on 07/11/2022) 15 mL 1   metoCLOPramide (REGLAN) 10 MG tablet Take 1 tablet (10 mg total) by mouth every 6 (six) hours as needed for nausea. 30 tablet 3   omeprazole (PRILOSEC) 20 MG capsule Take 1 capsule (20 mg total) by mouth daily. 90 capsule 1   ondansetron (ZOFRAN) 8 MG tablet Take 1 tablet (8 mg total) by mouth every 8 (eight) hours as needed for nausea or vomiting. (Patient not taking: Reported on 08/03/2022) 30 tablet 1    ROS: Pertinent positives and negative per HPI, all others reviewed and negative  Physical Exam   BP 136/79   Pulse 99   Temp 98.8 F (37.1 C) (Axillary)   Resp 19   Ht 5\' 5"  (1.651 m)   LMP 01/18/2022   SpO2 98%   BMI 32.62 kg/m   Patient Vitals for the past 24 hrs:  BP Temp Temp src Pulse Resp SpO2 Height  08/05/22 1457 -- 98.8 F (37.1 C) Axillary -- -- -- --  08/05/22 1220 -- 99 F (37.2 C) Oral -- -- -- --  08/05/22 1114 136/79 -- -- 99 -- -- --  08/05/22 0948 132/81 99 F (37.2 C) Oral 98 19 98 % --  08/05/22 0918 135/83 98.9 F (37.2 C) Oral (!) 105 20 97 % 5\' 5"  (1.651 m)    Physical Exam Vitals and nursing note reviewed.  Constitutional:      Appearance: Normal appearance.  HENT:     Head: Normocephalic and atraumatic.     Right Ear: External ear normal.     Left Ear: External ear normal.     Nose: Nose normal.      Mouth/Throat:     Mouth: Mucous membranes are dry.     Pharynx: Oropharynx is clear.  Eyes:     Conjunctiva/sclera: Conjunctivae normal.  Cardiovascular:     Rate and Rhythm: Regular rhythm. Tachycardia present.  Pulmonary:     Effort: Pulmonary effort is normal. No respiratory distress.     Breath sounds: Normal breath sounds. No wheezing.  Abdominal:     Palpations: Abdomen is soft.     Tenderness: There is no abdominal tenderness.     Comments: Gravid  Skin:    General: Skin is warm.     Capillary Refill: Capillary refill takes less than 2 seconds.  Neurological:     Mental Status: She is alert and oriented to person, place, and time. Mental status is at baseline.  Psychiatric:        Mood and Affect: Mood normal.        Behavior: Behavior normal.     Cervical Exam  Not done  Bedside Ultrasound not done  FHT 140 bpm/Moderate variability/ 15x15 accels/ None decels CAT: 1 Toco: regular, every 2-3 minutes   Labs Results for orders placed or performed during the hospital encounter of 08/05/22 (from the past 24 hour(s))  SARS Coronavirus 2 by RT PCR (hospital order, performed in [facility] hospital lab) *cepheid single result test* Anterior Nasal Swab     Status: Abnormal   Collection Time: 08/05/22 11:51 AM  Specimen: Anterior Nasal Swab  Result Value Ref Range   SARS Coronavirus 2 by RT PCR POSITIVE (A) NEGATIVE  Resp panel by RT-PCR (RSV, Flu A&B, Covid) Anterior Nasal Swab     Status: Abnormal   Collection Time: 08/05/22 11:51 AM   Specimen: Anterior Nasal Swab  Result Value Ref Range   SARS Coronavirus 2 by RT PCR POSITIVE (A) NEGATIVE   Influenza A by PCR NEGATIVE NEGATIVE   Influenza B by PCR NEGATIVE NEGATIVE   Resp Syncytial Virus by PCR NEGATIVE NEGATIVE  CBC with Differential/Platelet     Status: Abnormal   Collection Time: 08/05/22 11:54 AM  Result Value Ref Range   WBC 11.3 (H) 4.0 - 10.5 K/uL   RBC 3.49 (L) 3.87 - 5.11 MIL/uL   Hemoglobin 10.7 (L)  12.0 - 15.0 g/dL   HCT 82.9 (L) 56.2 - 13.0 %   MCV 89.1 80.0 - 100.0 fL   MCH 30.7 26.0 - 34.0 pg   MCHC 34.4 30.0 - 36.0 g/dL   RDW 86.5 78.4 - 69.6 %   Platelets 291 150 - 400 K/uL   nRBC 0.0 0.0 - 0.2 %   Neutrophils Relative % 88 %   Neutro Abs 9.9 (H) 1.7 - 7.7 K/uL   Lymphocytes Relative 4 %   Lymphs Abs 0.5 (L) 0.7 - 4.0 K/uL   Monocytes Relative 6 %   Monocytes Absolute 0.7 0.1 - 1.0 K/uL   Eosinophils Relative 1 %   Eosinophils Absolute 0.1 0.0 - 0.5 K/uL   Basophils Relative 0 %   Basophils Absolute 0.1 0.0 - 0.1 K/uL   Immature Granulocytes 1 %   Abs Immature Granulocytes 0.07 0.00 - 0.07 K/uL  Comprehensive metabolic panel     Status: Abnormal   Collection Time: 08/05/22 11:54 AM  Result Value Ref Range   Sodium 134 (L) 135 - 145 mmol/L   Potassium 4.2 3.5 - 5.1 mmol/L   Chloride 102 98 - 111 mmol/L   CO2 21 (L) 22 - 32 mmol/L   Glucose, Bld 97 70 - 99 mg/dL   BUN 6 6 - 20 mg/dL   Creatinine, Ser 2.95 (H) 0.44 - 1.00 mg/dL   Calcium 8.8 (L) 8.9 - 10.3 mg/dL   Total Protein 5.9 (L) 6.5 - 8.1 g/dL   Albumin 2.4 (L) 3.5 - 5.0 g/dL   AST 18 15 - 41 U/L   ALT 15 0 - 44 U/L   Alkaline Phosphatase 59 38 - 126 U/L   Total Bilirubin 0.5 0.3 - 1.2 mg/dL   GFR, Estimated >28 >41 mL/min   Anion gap 11 5 - 15  Troponin I (High Sensitivity)     Status: None   Collection Time: 08/05/22 11:54 AM  Result Value Ref Range   Troponin I (High Sensitivity) 8 <18 ng/L  Creatinine, serum     Status: Abnormal   Collection Time: 08/05/22 11:54 AM  Result Value Ref Range   Creatinine, Ser 1.09 (H) 0.44 - 1.00 mg/dL   GFR, Estimated >32 >44 mL/min  Urinalysis, Routine w reflex microscopic -Urine, Clean Catch     Status: Abnormal   Collection Time: 08/05/22  1:48 PM  Result Value Ref Range   Color, Urine YELLOW YELLOW   APPearance CLEAR CLEAR   Specific Gravity, Urine 1.016 1.005 - 1.030   pH 7.0 5.0 - 8.0   Glucose, UA NEGATIVE NEGATIVE mg/dL   Hgb urine dipstick NEGATIVE  NEGATIVE   Bilirubin Urine NEGATIVE NEGATIVE  Ketones, ur NEGATIVE NEGATIVE mg/dL   Protein, ur 161 (A) NEGATIVE mg/dL   Nitrite NEGATIVE NEGATIVE   Leukocytes,Ua SMALL (A) NEGATIVE   RBC / HPF 0-5 0 - 5 RBC/hpf   WBC, UA 0-5 0 - 5 WBC/hpf   Bacteria, UA RARE (A) NONE SEEN   Squamous Epithelial / HPF 0-5 0 - 5 /HPF    Imaging No results found.  MAU Course  MDM: moderate  This patient presents to the ED for concern of   Chief Complaint  Patient presents with   Nausea   Emesis   Chest Pain   Cough     These complains involves an extensive number of treatment options, and is a complaint that carries with it a high risk of complications and morbidity.  The differential diagnosis for  1.  URI INCLUDES, flu, RSV  2.  Chest pressure INCLUDES related to COVID, cardiac related  Co morbidities that complicate the patient evaluation:  Additional history obtained from partner  Interpreter services used: not applicable  External records from outside source obtained and reviewed including Prenatal care records  Lab Tests: UA, CMP, and CBC, COVID, RSV, flu, troponin  I ordered, and personally interpreted labs.  The pertinent results include: All the above  Imaging Studies ordered: Not applicable Cardiac Testing/Monitoring:  EKG was ordered today. I personally reviewed or consulted with a physician to help with intrepretation.     Medicines ordered and prescription drug management:  Medications: On the heart and IV bolus x 2, Zofran   Reevaluation of the patient after these medicines showed that the patient improved I have reviewed the patients home medicines and have made adjustments as needed  Critical Interventions: IV fluids  Consultations Obtained: n/a  MAU Course: -1057: Patient seen, not in acute distress, on room air, lung exam CTA, limited mildly tachycardic, dry mucous membranes.  COVID test, CBC, CMP, troponin, UA, RSV and flu ordered.  IV fluids added. -1151:  COVID-positive, EKG NSR, SpO2 98% ORA, TP nml -1154: Creatinine 1.05, suspect prerenal in the setting of vomiting.  Added another IV bolus.  Will repeat creatinine level after IV boluses completed. -1429: Patient continues stable on room air, Paxlovid ordered by pharmacy -1531: Pt noted wheezing, lungs CTA. Gave guafenesin for cough.  Patient feels better after interventions.  After the interventions noted above, I reevaluated the patient and found that they have :improved  Dispostion: discharged   Assessment and Plan  1. COVID-19 affecting pregnancy in third trimester - Discharge patient  2. [redacted] weeks gestation of pregnancy - Discharge patient   Patient manage her diagnoses above.  Sent home with Paxlovid, Zofran, guaifenesin.  She will need repeat creatinine level at her next Cardio-OB visit 7/29.  Gave return and preterm labor precautions.  Follow-up with primary OB.  Discharge Instructions     Discharge patient   Complete by: As directed    Discharge disposition: 01-Home or Self Care   Discharge patient date: 08/05/2022      Allergies as of 08/05/2022       Reactions   Ibuprofen Hives        Medication List     TAKE these medications    Accu-Chek Softclix Lancets lancets 100 each by Other route 4 (four) times daily. Use as instructed   acetaminophen 500 MG tablet Commonly known as: TYLENOL Take 1,000 mg by mouth every 6 (six) hours as needed for mild pain.   albuterol 108 (90 Base) MCG/ACT inhaler Commonly known as:  VENTOLIN HFA Inhale 2 puffs into the lungs every 6 (six) hours as needed for wheezing or shortness of breath.   amLODipine 10 MG tablet Commonly known as: NORVASC Take 1 tablet by mouth once daily   aspirin EC 81 MG tablet Take 2 tablets (162 mg total) by mouth daily. Swallow whole.   Cranberry 500 MG Caps Take 1 capsule by mouth daily.   ferrous sulfate 325 (65 FE) MG tablet Take by mouth.   Fish Oil 1000 MG Caps Take by mouth.    FreeStyle Libre 3 Sensor Misc Place 1 sensor on the skin every 14 days. Use to check glucose continuously   glucose blood test strip Use as instructed   guaiFENesin-codeine 100-10 MG/5ML syrup Take 10 mLs by mouth every 6 (six) hours as needed for up to 5 doses for cough.   ipratropium 0.06 % nasal spray Commonly known as: ATROVENT Place 2 sprays into both nostrils 4 (four) times daily. As needed for runny nose / postnasal drip   labetalol 200 MG tablet Commonly known as: NORMODYNE Take 3 tablets (600 mg total) by mouth 3 (three) times daily.   magnesium oxide 400 MG tablet Commonly known as: MAG-OX Take 0.5 tablets by mouth daily.   metFORMIN 500 MG tablet Commonly known as: GLUCOPHAGE Take two tablets in the morning and one tablet at night.   metoCLOPramide 10 MG tablet Commonly known as: REGLAN Take 1 tablet (10 mg total) by mouth every 6 (six) hours as needed for nausea.   multivitamin-prenatal 27-0.8 MG Tabs tablet Take 1 tablet by mouth daily at 12 noon.   omeprazole 20 MG capsule Commonly known as: PRILOSEC Take 1 capsule (20 mg total) by mouth daily.   ondansetron 8 MG tablet Commonly known as: Zofran Take 1 tablet (8 mg total) by mouth every 8 (eight) hours as needed for nausea or vomiting. What changed: Another medication with the same name was added. Make sure you understand how and when to take each.   ondansetron 4 MG tablet Commonly known as: Zofran Take 1 tablet (4 mg total) by mouth daily as needed for up to 10 days for nausea or vomiting. What changed: You were already taking a medication with the same name, and this prescription was added. Make sure you understand how and when to take each.        Myrtie Hawk, DO FMOB Fellow, Faculty practice [facility], [facility] for [facility] 08/05/22  3:40 PM

## 2022-08-05 NOTE — Discharge Instructions (Addendum)
You are diagnosed with COVID during pregnancy.  Please take Paxlovid 5 days as prescribed.  You can also take Tylenol 500 to 1000 mg 3 times a day.  Please take plenty of fluids, small meals, rest.  Take guaifenesin as needed for cough.  It does have medication component that can be sleepy.  If you notice greater difficulty breathing, increased shortness of breath, worsening chest pain, please return to the MAU as soon as possible.   In addition, your creatinine was elevated, likely because of your dehydration.  Please have your creatinine be rechecked at your next appointment.

## 2022-08-07 ENCOUNTER — Ambulatory Visit: Payer: BC Managed Care – PPO | Admitting: Cardiology

## 2022-08-07 ENCOUNTER — Encounter: Payer: Self-pay | Admitting: Obstetrics & Gynecology

## 2022-08-10 DIAGNOSIS — Z419 Encounter for procedure for purposes other than remedying health state, unspecified: Secondary | ICD-10-CM | POA: Diagnosis not present

## 2022-08-12 ENCOUNTER — Other Ambulatory Visit: Payer: Self-pay | Admitting: Medical-Surgical

## 2022-08-17 ENCOUNTER — Ambulatory Visit (INDEPENDENT_AMBULATORY_CARE_PROVIDER_SITE_OTHER): Payer: BC Managed Care – PPO | Admitting: *Deleted

## 2022-08-17 ENCOUNTER — Inpatient Hospital Stay (HOSPITAL_COMMUNITY)
Admission: AD | Admit: 2022-08-17 | Discharge: 2022-08-30 | DRG: 833 | Disposition: A | Discharge status: Home / Self Care | Payer: BC Managed Care – PPO | Attending: Obstetrics and Gynecology | Admitting: Obstetrics and Gynecology

## 2022-08-17 ENCOUNTER — Ambulatory Visit (INDEPENDENT_AMBULATORY_CARE_PROVIDER_SITE_OTHER): Payer: BC Managed Care – PPO | Admitting: Obstetrics and Gynecology

## 2022-08-17 ENCOUNTER — Encounter (HOSPITAL_COMMUNITY): Payer: Self-pay | Admitting: Obstetrics and Gynecology

## 2022-08-17 VITALS — BP 178/88 | HR 90 | Wt 190.0 lb

## 2022-08-17 DIAGNOSIS — O24415 Gestational diabetes mellitus in pregnancy, controlled by oral hypoglycemic drugs: Secondary | ICD-10-CM | POA: Diagnosis not present

## 2022-08-17 DIAGNOSIS — Z23 Encounter for immunization: Secondary | ICD-10-CM | POA: Diagnosis not present

## 2022-08-17 DIAGNOSIS — I10 Essential (primary) hypertension: Secondary | ICD-10-CM | POA: Diagnosis present

## 2022-08-17 DIAGNOSIS — N182 Chronic kidney disease, stage 2 (mild): Secondary | ICD-10-CM | POA: Diagnosis not present

## 2022-08-17 DIAGNOSIS — Z87891 Personal history of nicotine dependence: Secondary | ICD-10-CM

## 2022-08-17 DIAGNOSIS — O09512 Supervision of elderly primigravida, second trimester: Secondary | ICD-10-CM

## 2022-08-17 DIAGNOSIS — Z3A30 30 weeks gestation of pregnancy: Secondary | ICD-10-CM

## 2022-08-17 DIAGNOSIS — O26833 Pregnancy related renal disease, third trimester: Secondary | ICD-10-CM

## 2022-08-17 DIAGNOSIS — O26839 Pregnancy related renal disease, unspecified trimester: Secondary | ICD-10-CM

## 2022-08-17 DIAGNOSIS — O3509X0 Maternal care for (suspected) other central nervous system malformation or damage in fetus, not applicable or unspecified: Secondary | ICD-10-CM | POA: Diagnosis present

## 2022-08-17 DIAGNOSIS — O09513 Supervision of elderly primigravida, third trimester: Secondary | ICD-10-CM | POA: Diagnosis not present

## 2022-08-17 DIAGNOSIS — Z3A32 32 weeks gestation of pregnancy: Secondary | ICD-10-CM | POA: Diagnosis not present

## 2022-08-17 DIAGNOSIS — Z886 Allergy status to analgesic agent status: Secondary | ICD-10-CM | POA: Diagnosis not present

## 2022-08-17 DIAGNOSIS — O24419 Gestational diabetes mellitus in pregnancy, unspecified control: Secondary | ICD-10-CM | POA: Diagnosis present

## 2022-08-17 DIAGNOSIS — Z6831 Body mass index (BMI) 31.0-31.9, adult: Secondary | ICD-10-CM

## 2022-08-17 DIAGNOSIS — I517 Cardiomegaly: Secondary | ICD-10-CM

## 2022-08-17 DIAGNOSIS — Z7982 Long term (current) use of aspirin: Secondary | ICD-10-CM | POA: Diagnosis not present

## 2022-08-17 DIAGNOSIS — O99213 Obesity complicating pregnancy, third trimester: Secondary | ICD-10-CM | POA: Diagnosis not present

## 2022-08-17 DIAGNOSIS — I169 Hypertensive crisis, unspecified: Secondary | ICD-10-CM | POA: Diagnosis not present

## 2022-08-17 DIAGNOSIS — O368330 Maternal care for abnormalities of the fetal heart rate or rhythm, third trimester, not applicable or unspecified: Secondary | ICD-10-CM | POA: Diagnosis present

## 2022-08-17 DIAGNOSIS — O163 Unspecified maternal hypertension, third trimester: Principal | ICD-10-CM

## 2022-08-17 DIAGNOSIS — O359XX0 Maternal care for (suspected) fetal abnormality and damage, unspecified, not applicable or unspecified: Secondary | ICD-10-CM

## 2022-08-17 DIAGNOSIS — I471 Supraventricular tachycardia, unspecified: Secondary | ICD-10-CM

## 2022-08-17 DIAGNOSIS — O10913 Unspecified pre-existing hypertension complicating pregnancy, third trimester: Secondary | ICD-10-CM

## 2022-08-17 DIAGNOSIS — O10919 Unspecified pre-existing hypertension complicating pregnancy, unspecified trimester: Secondary | ICD-10-CM | POA: Diagnosis present

## 2022-08-17 DIAGNOSIS — O1403 Mild to moderate pre-eclampsia, third trimester: Secondary | ICD-10-CM | POA: Diagnosis not present

## 2022-08-17 DIAGNOSIS — Z79899 Other long term (current) drug therapy: Secondary | ICD-10-CM

## 2022-08-17 DIAGNOSIS — O0990 Supervision of high risk pregnancy, unspecified, unspecified trimester: Secondary | ICD-10-CM

## 2022-08-17 DIAGNOSIS — O3500X0 Maternal care for (suspected) central nervous system malformation or damage in fetus, unspecified, not applicable or unspecified: Secondary | ICD-10-CM | POA: Diagnosis not present

## 2022-08-17 DIAGNOSIS — Q899 Congenital malformation, unspecified: Secondary | ICD-10-CM | POA: Diagnosis not present

## 2022-08-17 DIAGNOSIS — O10013 Pre-existing essential hypertension complicating pregnancy, third trimester: Secondary | ICD-10-CM | POA: Diagnosis not present

## 2022-08-17 DIAGNOSIS — N189 Chronic kidney disease, unspecified: Secondary | ICD-10-CM | POA: Diagnosis not present

## 2022-08-17 DIAGNOSIS — Z3A31 31 weeks gestation of pregnancy: Secondary | ICD-10-CM | POA: Diagnosis not present

## 2022-08-17 DIAGNOSIS — N051 Unspecified nephritic syndrome with focal and segmental glomerular lesions: Secondary | ICD-10-CM

## 2022-08-17 DIAGNOSIS — O169 Unspecified maternal hypertension, unspecified trimester: Secondary | ICD-10-CM | POA: Diagnosis present

## 2022-08-17 DIAGNOSIS — I129 Hypertensive chronic kidney disease with stage 1 through stage 4 chronic kidney disease, or unspecified chronic kidney disease: Secondary | ICD-10-CM | POA: Diagnosis present

## 2022-08-17 DIAGNOSIS — Z8632 Personal history of gestational diabetes: Secondary | ICD-10-CM | POA: Diagnosis present

## 2022-08-17 LAB — CBC
HCT: 32.7 % — ABNORMAL LOW (ref 36.0–46.0)
Hemoglobin: 11.2 g/dL — ABNORMAL LOW (ref 12.0–15.0)
MCH: 31.2 pg (ref 26.0–34.0)
MCHC: 34.3 g/dL (ref 30.0–36.0)
MCV: 91.1 fL (ref 80.0–100.0)
Platelets: 349 10*3/uL (ref 150–400)
RBC: 3.59 MIL/uL — ABNORMAL LOW (ref 3.87–5.11)
RDW: 12.5 % (ref 11.5–15.5)
WBC: 13 10*3/uL — ABNORMAL HIGH (ref 4.0–10.5)
nRBC: 0 % (ref 0.0–0.2)

## 2022-08-17 LAB — COMPREHENSIVE METABOLIC PANEL
ALT: 17 U/L (ref 0–44)
AST: 21 U/L (ref 15–41)
Albumin: 2.7 g/dL — ABNORMAL LOW (ref 3.5–5.0)
Alkaline Phosphatase: 54 U/L (ref 38–126)
Anion gap: 13 (ref 5–15)
BUN: 8 mg/dL (ref 6–20)
CO2: 21 mmol/L — ABNORMAL LOW (ref 22–32)
Calcium: 9.5 mg/dL (ref 8.9–10.3)
Chloride: 101 mmol/L (ref 98–111)
Creatinine, Ser: 1.02 mg/dL — ABNORMAL HIGH (ref 0.44–1.00)
GFR, Estimated: >60 mL/min (ref >60)
Glucose, Bld: 101 mg/dL — ABNORMAL HIGH (ref 70–99)
Potassium: 3.9 mmol/L (ref 3.5–5.1)
Sodium: 135 mmol/L (ref 135–145)
Total Bilirubin: 0.4 mg/dL (ref 0.3–1.2)
Total Protein: 6.7 g/dL (ref 6.5–8.1)

## 2022-08-17 LAB — TYPE AND SCREEN
ABO/RH(D): A POS
Antibody Screen: NEGATIVE

## 2022-08-17 LAB — PROTEIN / CREATININE RATIO, URINE
Creatinine, Urine: 140 mg/dL
Protein Creatinine Ratio: 1.08 mg/mg{Cre} — ABNORMAL HIGH (ref 0.00–0.15)
Total Protein, Urine: 151 mg/dL

## 2022-08-17 MED ORDER — AMLODIPINE BESYLATE 10 MG PO TABS
10.0000 mg | ORAL_TABLET | Freq: Every day | ORAL | Status: DC
  Administered 2022-08-18: 10 mg via ORAL
  Filled 2022-08-17 (×2): qty 1

## 2022-08-17 MED ORDER — HYDRALAZINE HCL 20 MG/ML IJ SOLN: 5.0000 mg | INTRAMUSCULAR | Status: DC | PRN

## 2022-08-17 MED ORDER — LACTATED RINGERS IV SOLN: INTRAVENOUS | Status: DC

## 2022-08-17 MED ORDER — HYDRALAZINE HCL 20 MG/ML IJ SOLN
INTRAMUSCULAR | Status: AC
  Administered 2022-08-17: 5 mg via INTRAVENOUS
  Filled 2022-08-17: qty 1

## 2022-08-17 MED ORDER — PRENATAL MULTIVITAMIN CH
1.0000 | ORAL_TABLET | Freq: Every day | ORAL | Status: DC
  Administered 2022-08-18 – 2022-08-30 (×13): 1 via ORAL
  Filled 2022-08-17 (×13): qty 1

## 2022-08-17 MED ORDER — ONDANSETRON HCL 4 MG/2ML IJ SOLN
4.0000 mg | Freq: Four times a day (QID) | INTRAMUSCULAR | Status: DC | PRN
  Administered 2022-08-17 – 2022-08-29 (×6): 4 mg via INTRAVENOUS
  Filled 2022-08-17 (×6): qty 2

## 2022-08-17 MED ORDER — BETAMETHASONE SOD PHOS & ACET 6 (3-3) MG/ML IJ SUSP
12.0000 mg | INTRAMUSCULAR | Status: AC
  Administered 2022-08-17 – 2022-08-18 (×2): 12 mg via INTRAMUSCULAR
  Filled 2022-08-17: qty 5

## 2022-08-17 MED ORDER — MAGNESIUM SULFATE 40 GM/1000ML IV SOLN: 1.0000 g/h | INTRAVENOUS | Status: AC

## 2022-08-17 MED ORDER — LABETALOL HCL 5 MG/ML IV SOLN: 40.0000 mg | INTRAVENOUS | Status: DC | PRN

## 2022-08-17 MED ORDER — LABETALOL HCL 200 MG PO TABS
800.0000 mg | ORAL_TABLET | Freq: Three times a day (TID) | ORAL | Status: DC
  Administered 2022-08-17 – 2022-08-30 (×39): 800 mg via ORAL
  Filled 2022-08-17 (×40): qty 4

## 2022-08-17 MED ORDER — MAGNESIUM SULFATE 40 GM/1000ML IV SOLN
INTRAVENOUS | Status: AC
  Filled 2022-08-17: qty 1000

## 2022-08-17 MED ORDER — MAGNESIUM SULFATE BOLUS VIA INFUSION
4.0000 g | Freq: Once | INTRAVENOUS | Status: AC
  Administered 2022-08-17: 4 g via INTRAVENOUS
  Filled 2022-08-17: qty 1000

## 2022-08-17 MED ORDER — LABETALOL HCL 5 MG/ML IV SOLN
20.0000 mg | INTRAVENOUS | Status: DC | PRN
  Administered 2022-08-17: 20 mg via INTRAVENOUS
  Filled 2022-08-17 (×2): qty 4

## 2022-08-17 MED ORDER — INSULIN ASPART 100 UNIT/ML IJ SOLN: 0.0000 [IU] | Freq: Three times a day (TID) | INTRAMUSCULAR | Status: DC

## 2022-08-17 MED ORDER — CALCIUM CARBONATE ANTACID 500 MG PO CHEW
2.0000 | CHEWABLE_TABLET | ORAL | Status: DC | PRN
  Administered 2022-08-24 – 2022-08-25 (×2): 400 mg via ORAL
  Filled 2022-08-17 (×2): qty 2

## 2022-08-17 MED ORDER — ACETAMINOPHEN 325 MG PO TABS
650.0000 mg | ORAL_TABLET | ORAL | Status: DC | PRN
  Administered 2022-08-18 – 2022-08-29 (×6): 650 mg via ORAL
  Filled 2022-08-17 (×8): qty 2

## 2022-08-17 MED ORDER — HYDRALAZINE HCL 20 MG/ML IJ SOLN
10.0000 mg | INTRAMUSCULAR | Status: DC | PRN
  Administered 2022-08-17: 10 mg via INTRAVENOUS
  Filled 2022-08-17: qty 1

## 2022-08-17 MED ORDER — HYDRALAZINE HCL 50 MG PO TABS
25.0000 mg | ORAL_TABLET | Freq: Four times a day (QID) | ORAL | Status: DC
  Administered 2022-08-17 – 2022-08-30 (×51): 25 mg via ORAL
  Filled 2022-08-17 (×53): qty 1

## 2022-08-17 NOTE — Progress Notes (Signed)
NST today

## 2022-08-17 NOTE — MAU Note (Signed)
.  Selena Meyer is a 37 y.o. at [redacted]w[redacted]d here in MAU reporting: sent over from the office for severe range BP's in the office today. Patient has CHTN and is taking Labetalol 600 mg TID and amplodipine 10 mg. Pt reports that she was supposed to take next dose of Labetalol at 1600, but has not yet. Patient reports mild HA, 2/10 that just started after using the bathroom 3 minutes before triage. She also reports intermittent epigastric pain that is not present currently.   PIH Assessment: Headache present: Yes ;No treatment attempted Visual disturbances: None RUQ pain/Epigastric: Aching and Intermittent Atypical edema: None Hx of HBP: CHTN BP Medications: Labetalol 600 mg TID and amplodipine 10 mg   LMP: N/A Onset of complaint: On-going Pain score: 2/10 Vitals:   08/17/22 1722  BP: (!) 172/105  Pulse: 97  Resp: 16  Temp: 98.3 F (36.8 C)  SpO2: 98%     [id number] triage/patient taken straight to room Lab orders placed from triage:  UA

## 2022-08-17 NOTE — H&P (Signed)
Chief Complaint:  Hypertension  HPI   Event Date/Time   First Provider Initiated Contact with Patient 08/17/22 1733     Selena Meyer is a 37 y.o. G1P0 at [redacted]w[redacted]d who presents to maternity admissions as directed by Dr. Berton Lan to r/o SIPE vs HTN crisis given her history of FSGS/CKD. Reports a mild headache on admission and flares of epigastric/upper quadrant pain but cannot tell if this is the baby moving/kicking. No other physical complaints.  Pregnancy Course: Receives care at [facility], is a red-chart patient.  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    Gestational diabetes    Hypertension    Hypokalemia 11/02/2020   Irregular menstrual cycle    Irregular periods/menstrual cycles 05/03/2017   Will hold off on Provera or other fertility stuff for now, pt advised pregnancy is not recommended until BP under better control at least, plus other workup pen   Polycystic ovaries 11/09/2015   Proteinuria 02/28/2013   Stage 2 chronic kidney disease 04/27/2020   SVT (supraventricular tachycardia) 11/01/2020   Systolic murmur 07/16/2017   Tobacco abuse 02/28/2013   Uncontrolled hypertension 05/10/2017   OB History  Gravida Para Term Preterm AB Living  1            SAB IAB Ectopic Multiple Live Births               # Outcome Date GA Lbr Len/2nd Weight Sex Type Anes PTL Lv  1 Current            Past Surgical History:  Procedure Laterality Date   NO PAST SURGERIES     Family History  Problem Relation Age of Onset   Diabetes Mother    High blood pressure Mother    Renal Disease Mother    Diabetes Sister    Cancer Maternal Grandmother    Diabetes Maternal Grandmother    High blood  pressure Maternal Grandmother    Cancer Maternal Aunt        breast   Other Father    Social History   Tobacco Use   Smoking status: Former    Current packs/day: 0.00    Average packs/day: 0.3 packs/day for 5.0 years (1.3 ttl pk-yrs)    Types: Cigarettes    Start date: 05/16/2016    Quit date: 05/16/2021    Years since quitting: 1.2   Smokeless tobacco: Never   Tobacco comments:    Quit smoking 2023-06-07after her mom passed away  Vaping Use   Vaping status: Never Used  Substance Use Topics   Alcohol use: Not Currently    Comment: "occasional use; social drinker"   Drug use: No   Allergies  Allergen Reactions   Ibuprofen Hives   Medications Prior to Admission  Medication Sig Dispense Refill Last Dose   amLODipine (NORVASC) 10 MG tablet Take 1 tablet by mouth once daily 90 tablet 0 08/17/2022   aspirin EC 81 MG tablet Take 2 tablets (162 mg total) by mouth daily. Swallow whole. 30 tablet 12 08/17/2022   ferrous sulfate 325 (65 FE) MG tablet Take by mouth.   08/17/2022   labetalol (NORMODYNE) 200 MG tablet Take 3 tablets (600 mg total) by mouth 3 (three) times  daily. 270 tablet 3 08/17/2022 at 0800   metFORMIN (GLUCOPHAGE) 500 MG tablet Take two tablets in the morning and one tablet at night. 90 tablet 5 08/17/2022   omeprazole (PRILOSEC) 20 MG capsule Take 1 capsule (20 mg total) by mouth daily. 90 capsule 1 08/17/2022   Prenatal Vit-Fe Fumarate-FA (MULTIVITAMIN-PRENATAL) 27-0.8 MG TABS tablet Take 1 tablet by mouth daily at 12 noon.   08/16/2022   Accu-Chek Softclix Lancets lancets 100 each by Other route 4 (four) times daily. Use as instructed 100 each 12    acetaminophen (TYLENOL) 500 MG tablet Take 1,000 mg by mouth every 6 (six) hours as needed for mild pain.       albuterol (VENTOLIN HFA) 108 (90 Base) MCG/ACT inhaler Inhale 2 puffs into the lungs every 6 (six) hours as needed for wheezing or shortness of breath. 1 each 1    Continuous Glucose Sensor (FREESTYLE LIBRE 3 SENSOR) MISC Place 1  sensor on the skin every 14 days. Use to check glucose continuously 2 each 6    Cranberry 500 MG CAPS Take 1 capsule by mouth daily.      glucose blood test strip Use as instructed 100 each 12    guaiFENesin-codeine 100-10 MG/5ML syrup Take 10 mLs by mouth every 6 (six) hours as needed for up to 5 doses for cough. 50 mL 0    ipratropium (ATROVENT) 0.06 % nasal spray Place 2 sprays into both nostrils 4 (four) times daily. As needed for runny nose / postnasal drip 15 mL 1    magnesium oxide (MAG-OX) 400 MG tablet Take 0.5 tablets by mouth daily.      metoCLOPramide (REGLAN) 10 MG tablet Take 1 tablet (10 mg total) by mouth every 6 (six) hours as needed for nausea. 30 tablet 3    Omega-3 Fatty Acids (FISH OIL) 1000 MG CAPS Take by mouth.      ondansetron (ZOFRAN) 8 MG tablet Take 1 tablet (8 mg total) by mouth every 8 (eight) hours as needed for nausea or vomiting. 30 tablet 1    I have reviewed patient's Past Medical Hx, Surgical Hx, Family Hx, Social Hx, medications and allergies.   ROS  Pertinent items noted in HPI and remainder of comprehensive ROS otherwise negative.   PHYSICAL EXAM  Patient Vitals for the past 24 hrs:  BP Temp Temp src Pulse Resp SpO2 Height Weight  08/17/22 2020 -- -- -- -- -- 97 % -- --  08/17/22 2015 (!) 157/92 -- -- 96 -- 97 % -- --  08/17/22 2010 -- -- -- -- -- 97 % -- --  08/17/22 2005 -- -- -- -- -- 97 % -- --  08/17/22 2000 (!) 148/87 -- -- 96 -- 95 % -- --  08/17/22 1955 -- -- -- -- -- 98 % -- --  08/17/22 1950 (!) 158/88 -- -- 92 -- -- -- --  08/17/22 1945 -- -- -- -- -- 98 % -- --  08/17/22 1940 -- -- -- -- -- 97 % -- --  08/17/22 1935 -- -- -- -- -- 98 % -- --  08/17/22 1933 (!) 152/92 -- -- 97 16 -- -- --  08/17/22 1900 -- -- -- -- -- 98 % -- --  08/17/22 1855 (!) 154/91 -- -- 89 -- 99 % -- --  08/17/22 1850 -- -- -- -- -- 99 % -- --  08/17/22 1845 -- -- -- -- -- 99 % -- --  08/17/22 1840 -- -- -- -- --  98 % -- --  08/17/22 1835 -- -- -- -- -- 99 %  -- --  08/17/22 1830 (!) 161/90 -- -- 98 -- 99 % -- --  08/17/22 1825 -- -- -- -- -- 99 % -- --  08/17/22 1820 -- -- -- -- -- 99 % -- --  08/17/22 1815 (!) 153/91 -- -- (!) 106 -- 99 % -- --  08/17/22 1814 (!) 157/93 -- -- 99 -- -- -- --  08/17/22 1810 -- -- -- -- -- 99 % -- --  08/17/22 1805 -- -- -- -- -- 99 % -- --  08/17/22 1801 (!) 166/95 -- -- 87 -- -- -- --  08/17/22 1800 -- -- -- -- -- 99 % -- --  08/17/22 1755 -- -- -- -- -- 98 % -- --  08/17/22 1750 -- -- -- -- -- 98 % -- --  08/17/22 1746 (!) 165/98 -- -- 92 -- -- -- --  08/17/22 1745 -- -- -- -- -- 98 % -- --  08/17/22 1740 -- -- -- -- -- 98 % -- --  08/17/22 1735 -- -- -- -- -- 98 % -- --  08/17/22 1731 (!) 172/99 -- -- 88 -- 99 % -- --  08/17/22 1725 -- -- -- -- -- 98 % -- --  08/17/22 1722 (!) 172/105 98.3 F (36.8 C) Oral 97 16 98 % -- --  08/17/22 1717 -- -- -- -- -- -- 5\' 5"  (1.651 m) 192 lb 14.4 oz (87.5 kg)   Constitutional: Well-developed, well-nourished female in no acute distress.  Cardiovascular: normal rate & rhythm, warm and well-perfused Respiratory: normal effort, no problems with respiration noted GI: Abd soft, non-tender, non-distended MS: Extremities nontender, no edema, normal ROM Neurologic: Alert and oriented x 4.  GU: no CVA tenderness Pelvic: exam deferred  Fetal Tracing: reactive Baseline: 140 Variability: moderate, appropriate for gestational age Accelerations: 10x10 Decelerations: variables, appropriate for gestational age Toco: relaxed, occasional UI   Labs: Results for orders placed or performed during the hospital encounter of 08/17/22 (from the past 24 hour(s))  Protein / creatinine ratio, urine     Status: Abnormal   Collection Time: 08/17/22  5:42 PM  Result Value Ref Range   Creatinine, Urine 140 mg/dL   Total Protein, Urine 151 mg/dL   Protein Creatinine Ratio 1.08 (H) 0.00 - 0.15 mg/mg[Cre]  CBC     Status: Abnormal   Collection Time: 08/17/22  5:48 PM  Result Value Ref  Range   WBC 13.0 (H) 4.0 - 10.5 K/uL   RBC 3.59 (L) 3.87 - 5.11 MIL/uL   Hemoglobin 11.2 (L) 12.0 - 15.0 g/dL   HCT 25.3 (L) 66.4 - 40.3 %   MCV 91.1 80.0 - 100.0 fL   MCH 31.2 26.0 - 34.0 pg   MCHC 34.3 30.0 - 36.0 g/dL   RDW 47.4 25.9 - 56.3 %   Platelets 349 150 - 400 K/uL   nRBC 0.0 0.0 - 0.2 %  Comprehensive metabolic panel     Status: Abnormal   Collection Time: 08/17/22  5:48 PM  Result Value Ref Range   Sodium 135 135 - 145 mmol/L   Potassium 3.9 3.5 - 5.1 mmol/L   Chloride 101 98 - 111 mmol/L   CO2 21 (L) 22 - 32 mmol/L   Glucose, Bld 101 (H) 70 - 99 mg/dL   BUN 8 6 - 20 mg/dL   Creatinine, Ser 8.75 (H) 0.44 - 1.00 mg/dL   Calcium 9.5  8.9 - 10.3 mg/dL   Total Protein 6.7 6.5 - 8.1 g/dL   Albumin 2.7 (L) 3.5 - 5.0 g/dL   AST 21 15 - 41 U/L   ALT 17 0 - 44 U/L   Alkaline Phosphatase 54 38 - 126 U/L   Total Bilirubin 0.4 0.3 - 1.2 mg/dL   GFR, Estimated >98 >11 mL/min   Anion gap 13 5 - 15  Type and screen [facility]     Status: None (Preliminary result)   Collection Time: 08/17/22  5:48 PM  Result Value Ref Range   ABO/RH(D) PENDING    Antibody Screen PENDING    Sample Expiration      08/20/2022,2359 Performed at [facility], [address], [place], [place] [address]    Imaging:  No results found.  MDM & MAU COURSE  MAU Course: Orders Placed This Encounter  Procedures   Protein / creatinine ratio, urine   CBC   Comprehensive metabolic panel   Diet regular Room service appropriate? Yes; Fluid consistency: Thin   Apply Hypertensive Disorders of Pregnancy Care Plan   Measure blood pressure   Vital signs   Defer vaginal exam for vaginal bleeding or PROM <37 weeks   Apply Antepartum Care Plan   Initiate Oral Care Protocol   Initiate Carrier Fluid Protocol   Fetal monitoring   Strict intake and output   Full code   Type and screen [facility]   Meds ordered this encounter  Medications   AND Linked Order Group     hydrALAZINE (APRESOLINE) injection 5 mg    hydrALAZINE (APRESOLINE) injection 10 mg    labetalol (NORMODYNE) injection 20 mg    labetalol (NORMODYNE) injection 40 mg   hydrALAZINE (APRESOLINE) 20 MG/ML injection    Dickens, Whitney L: cabinet override   acetaminophen (TYLENOL) tablet 650 mg   calcium carbonate (TUMS - dosed in mg elemental calcium) chewable tablet 400 mg of elemental calcium   prenatal multivitamin tablet 1 tablet   lactated ringers infusion   magnesium bolus via infusion 4 g   magnesium sulfate 40 grams in SWI 1000 mL OB infusion   lactated ringers infusion   labetalol (NORMODYNE) tablet 800 mg   amLODipine (NORVASC) tablet 10 mg   hydrALAZINE (APRESOLINE) tablet 25 mg   betamethasone acetate-betamethasone sodium phosphate (CELESTONE) injection 12 mg   Pt had two severe range pressures on arrival to MAU. Put in hydralazine protocol, pt progressed through it and labetalol but pressures remained borderline severe range to severe range. Labs normal aside from P:Cr but that was fairly stable compared to previous values.  Discussed presentation/findings with Dr. Macon Large who agreed to antenatal admission for magnesium infusion and hypertension control. Discussed plan with patient, gave time for questions and pt amenable to staying. Orders placed.   ASSESSMENT   1. Hypertension affecting pregnancy in third trimester    PLAN  Admit to antenatal for IV magnesium and HTN control - BMZ ordered q24 x2  Edd Arbour, CNM, MSN, IBCLC Certified Nurse Midwife, [facility]

## 2022-08-17 NOTE — Progress Notes (Signed)
NST-Reactive and reviewed by Dr Berton Lan. Pt was sent to MAU for evaluation of her BP by Dr Berton Lan.

## 2022-08-17 NOTE — Progress Notes (Signed)
PRENATAL VISIT NOTE  Subjective:  Selena Meyer is a 37 y.o. G1P0 at [redacted]w[redacted]d being seen today for ongoing prenatal care.  She is currently monitored for the following issues for this high-risk pregnancy and has FSGS (focal segmental glomerulosclerosis); SVT (supraventricular tachycardia); IDA (iron deficiency anemia); Supervision of high risk pregnancy, antepartum; Gestational diabetes mellitus (GDM), antepartum; AMA (advanced maternal age) primigravida 52+; Chronic hypertension during pregnancy, antepartum; Kidney disease in pregnancy, unspecified trimester; Morbid obesity (HCC); Moderate left ventricular hypertrophy; and Known fetal anomaly, antepartum on their problem list.  Patient reports doing ok overall. Still having intermittent vomiting but zofran from MAU is helping. Intermittent HA.  Denies current HA, vis changes, RUQ/epigastric pain.  Contractions: Not present. Vag. Bleeding: None.  Movement: Present. Denies leaking of fluid.   The following portions of the patient's history were reviewed and updated as appropriate: allergies, current medications, past family history, past medical history, past social history, past surgical history and problem list.   Objective:   Vitals:   08/17/22 1551 08/17/22 1557  BP:  (!) 178/88  Pulse:  90  Weight: 190 lb (86.2 kg)    Fetal Status:     Movement: Present    NST appropriate for gestational age  General:  Alert, oriented and cooperative. Patient is in no acute distress.  Skin: Skin is warm and dry. No rash noted.   Cardiovascular: Normal heart rate noted  Respiratory: Normal respiratory effort, no problems with respiration noted  Abdomen: Soft, gravid, appropriate for gestational age.  Pain/Pressure: Absent      Assessment and Plan:  Pregnancy: G1P0 at [redacted]w[redacted]d 1. Supervision of high risk pregnancy, antepartum 2. [redacted] weeks gestation of pregnancy Tdap today NST AGA  3. Chronic hypertension during pregnancy - concern for developing  superimposed preeclampsia - BP 170s/80-90s over 3 rechecks in the office today. Higher than home BP of 140-150s/80s. Did not miss any meds - Currently on labetalol 600mg  TID (last titrated up 2 weeks ago) and amlodipine 10mg  -- plan was to switch amlodipine to procardia 60mg  at this appointment, however given new severe range pressures will send to MAU for r/o SIPE w/ SF (BP). Pt counseled to anticipate admission, but if discharged from MAU will need to change rx to labetalol 600 TID and procardia 60 daily.  - Last growth @ 28/1: 1132g (26%), AC 51%, cephalic, anterior, AFI 23.26  4. FSGS (focal segmental glomerulosclerosis) 5. Kidney disease in pregnancy, unspecified trimester Cr 0.98-1.16 & PCR 0.57-1.286 this pregnancy Appt scheduled w/ nephrologist Dr. Glenna Fellows on 8/21  6. Moderate left ventricular hypertrophy 12. Supraventricular tachycardia F/w Dr. Servando Salina & [facility] cardiology, next appt scheduled 10/7 (missed 7/29 appt)  7. Known fetal anomaly, antepartum, single or unspecified fetus Mild ventriculomegaly, possible open neural tube defect - last [place] (7/25) c/f Blake's pouch cyst Toxo neg, CMV IgG positive but IgM negative (not c/w acute infxn) Declined amnio Fetal brain MRI scheduled 8/27  8. Gestational diabetes mellitus (GDM) controlled on oral hypoglycemic drug, antepartum Intermittently checking BG due to nausea/vomiting Fasting < 95, postprandial 100-120s Continue metformin 1000/500 Normal fetal echo  10. Primigravida of advanced maternal age in second trimester LR NIPS Anatomy US as above  11. BMI 32.0-32.9,adult  Please refer to After Visit Summary for other counseling recommendations.   Patient instructed to present to MAU for care. She and her partner are agreeable. MAU team notified.   Future Appointments  Date Time Provider Department Center  08/31/2022  1:45 PM [facility] NURSE [facility]  08/31/2022  2:00 PM [facility] US1 [facility]  08/31/2022  4:10 PM Lennart Pall, MD [facility]  09/07/2022  8:30 AM [facility] NURSE [facility]  09/07/2022  8:45 AM [facility] US5 [facility]  09/14/2022  8:30 AM [facility] NURSE [facility]  09/14/2022  8:45 AM [facility] US5 [facility]  09/21/2022 10:15 AM [facility] NURSE [facility]  09/21/2022 10:30 AM [facility] US2 [facility]  09/28/2022  9:15 AM [facility] NURSE [facility]  09/28/2022  9:30 AM [facility] US3 [facility]  10/16/2022  8:20 AM Tobb, Kardie, DO [facility] None  11/23/2022 11:00 AM [facility] None  11/23/2022 11:15 AM Covington, Brand Males, PA-C [facility] None    Lennart Pall, MD

## 2022-08-18 ENCOUNTER — Other Ambulatory Visit: Payer: Self-pay

## 2022-08-18 DIAGNOSIS — O10913 Unspecified pre-existing hypertension complicating pregnancy, third trimester: Principal | ICD-10-CM

## 2022-08-18 DIAGNOSIS — O10013 Pre-existing essential hypertension complicating pregnancy, third trimester: Secondary | ICD-10-CM

## 2022-08-18 DIAGNOSIS — O24415 Gestational diabetes mellitus in pregnancy, controlled by oral hypoglycemic drugs: Secondary | ICD-10-CM

## 2022-08-18 DIAGNOSIS — O26833 Pregnancy related renal disease, third trimester: Secondary | ICD-10-CM

## 2022-08-18 LAB — GLUCOSE, CAPILLARY
Glucose-Capillary: 106 mg/dL — ABNORMAL HIGH (ref 70–99)
Glucose-Capillary: 146 mg/dL — ABNORMAL HIGH (ref 70–99)
Glucose-Capillary: 118 mg/dL — ABNORMAL HIGH (ref 70–99)
Glucose-Capillary: 105 mg/dL — ABNORMAL HIGH (ref 70–99)
Glucose-Capillary: 117 mg/dL — ABNORMAL HIGH (ref 70–99)
Glucose-Capillary: 102 mg/dL — ABNORMAL HIGH (ref 70–99)

## 2022-08-18 LAB — MAGNESIUM: Magnesium: 6.1 mg/dL (ref 1.7–2.4)

## 2022-08-18 MED ORDER — METFORMIN HCL 500 MG PO TABS
1000.0000 mg | ORAL_TABLET | Freq: Every day | ORAL | Status: DC
  Administered 2022-08-18 – 2022-08-19 (×2): 1000 mg via ORAL
  Filled 2022-08-18 (×2): qty 2

## 2022-08-18 MED ORDER — INSULIN ASPART 100 UNIT/ML IJ SOLN
0.0000 [IU] | Freq: Four times a day (QID) | INTRAMUSCULAR | Status: DC | PRN
  Administered 2022-08-18: 3 [IU] via SUBCUTANEOUS

## 2022-08-18 MED ORDER — SODIUM CHLORIDE 0.9 % IV SOLN
25.0000 mg | Freq: Once | INTRAVENOUS | Status: AC
  Administered 2022-08-18: 25 mg via INTRAVENOUS
  Filled 2022-08-18: qty 1

## 2022-08-18 MED ORDER — INSULIN ASPART 100 UNIT/ML IJ SOLN
0.0000 [IU] | Freq: Three times a day (TID) | INTRAMUSCULAR | Status: DC
  Administered 2022-08-18 (×2): 2 [IU] via SUBCUTANEOUS
  Administered 2022-08-19: 4 [IU] via SUBCUTANEOUS
  Administered 2022-08-19: 3 [IU] via SUBCUTANEOUS
  Administered 2022-08-19 – 2022-08-20 (×2): 4 [IU] via SUBCUTANEOUS
  Administered 2022-08-20 (×2): 3 [IU] via SUBCUTANEOUS
  Administered 2022-08-21: 2 [IU] via SUBCUTANEOUS
  Administered 2022-08-21 – 2022-08-22 (×3): 3 [IU] via SUBCUTANEOUS
  Administered 2022-08-22: 2 [IU] via SUBCUTANEOUS
  Administered 2022-08-22: 4 [IU] via SUBCUTANEOUS
  Administered 2022-08-23 (×2): 3 [IU] via SUBCUTANEOUS
  Administered 2022-08-23: 6 [IU] via SUBCUTANEOUS
  Administered 2022-08-24: 2 [IU] via SUBCUTANEOUS
  Administered 2022-08-25: 3 [IU] via SUBCUTANEOUS
  Administered 2022-08-25 (×2): 2 [IU] via SUBCUTANEOUS

## 2022-08-18 MED ORDER — INSULIN ASPART 100 UNIT/ML IJ SOLN: 0.0000 [IU] | Freq: Three times a day (TID) | INTRAMUSCULAR | Status: DC

## 2022-08-18 MED ORDER — METFORMIN HCL 500 MG PO TABS
500.0000 mg | ORAL_TABLET | Freq: Every day | ORAL | Status: DC
  Administered 2022-08-18: 500 mg via ORAL
  Filled 2022-08-18: qty 1

## 2022-08-18 NOTE — Inpatient Diabetes Management (Signed)
Inpatient Diabetes Program Recommendations  AACE/ADA: New Consensus Statement on Inpatient Glycemic Control (2015)  Target Ranges:  Prepandial:   less than 140 mg/dL      Peak postprandial:   less than 180 mg/dL (1-2 hours)      Critically ill patients:  140 - 180 mg/dL   Lab Results  Component Value Date   GLUCAP 106 (H) 08/18/2022   HGBA1C 5.5 08/18/2022    Review of Glycemic Control  Latest Reference Range & Units 08/18/22 01:14  Glucose-Capillary 70 - 99 mg/dL 147 (H)  (H): Data is abnormally high  Latest Reference Range & Units 08/17/22 17:48 08/18/22 04:18  Glucose 70 - 99 mg/dL 829 (H) 562 (H)  (H): Data is abnormally high  Diabetes history: GDM Outpatient Diabetes medications: Metformin 1000 mg QAM and 500 mg QPM Current orders for Inpatient glycemic control: Novolog 0-16 units Q4H PRN Betamethasone 12.5 mg first does on 8/8 @ 2243; 2nd dose today  30/2 weeks HTN  Inpatient Diabetes Program Recommendations:    Please change correction to:  Novolog 0-16 units TID after meals Continue home dose of Metformin   Will continue to follow while inpatient.  Thank you, Dulce Sellar, MSN, CDCES Diabetes Coordinator Inpatient Diabetes Program [phone] (team pager from 8a-5p)

## 2022-08-18 NOTE — Progress Notes (Signed)
Patient ID: [name], female   DOB: Jun 14, 1985, 37 y.o.   MRN: [id number] FACULTY PRACTICE ANTEPARTUM(COMPREHENSIVE) NOTE  DALEA HENLY is a 37 y.o. G1P0 at [redacted]w[redacted]d  who is admitted for management of CHTN and rule out superimposed pre-eclampsia.    Fetal presentation is unsure. Length of Stay:  1  Days  Date of admission:08/17/2022  Subjective: Patient reports feeling well and denies HA, visual changes, RUQ/epigastric pain, nausea or emesis. She reports good fetal movement. She denies vaginal bleeding, leakage of fluid or contractions.   Vitals:  Blood pressure 129/71, pulse 88, temperature 97.9 F (36.6 C), temperature source Oral, resp. rate 18, height 5\' 5"  (1.651 m), weight 87.5 kg, last menstrual period 01/18/2022, SpO2 98%. Vitals:   08/18/22 0600 08/18/22 0700 08/18/22 0800 08/18/22 0900  BP: (!) 154/84 129/71    Pulse: 91 97 88 88  Resp: 20 20 20 18   Temp: 98.3 F (36.8 C) 97.9 F (36.6 C)    TempSrc: Oral Oral    SpO2: 98% 98% 98% 98%  Weight:      Height:       Physical Examination: GENERAL: Well-developed, well-nourished female in no acute distress.  LUNGS: Clear to auscultation bilaterally.  HEART: Regular rate and rhythm. ABDOMEN: Soft, nontender, nondistended. No organomegaly. PELVIC: Not indicated EXTREMITIES: No cyanosis, clubbing, or edema, 2+ distal pulses.  Fetal Monitoring:     baseline 120, mod variability, +accels, no decels  Labs:  Results for orders placed or performed during the hospital encounter of 08/17/22 (from the past 24 hour(s))  Protein / creatinine ratio, urine   Collection Time: 08/17/22  5:42 PM  Result Value Ref Range   Creatinine, Urine 140 mg/dL   Total Protein, Urine 151 mg/dL   Protein Creatinine Ratio 1.08 (H) 0.00 - 0.15 mg/mg[Cre]  CBC   Collection Time: 08/17/22  5:48 PM  Result Value Ref Range   WBC 13.0 (H) 4.0 - 10.5 K/uL   RBC 3.59 (L) 3.87 - 5.11 MIL/uL   Hemoglobin 11.2 (L) 12.0 - 15.0 g/dL   HCT 64.4 (L) 03.4 -  46.0 %   MCV 91.1 80.0 - 100.0 fL   MCH 31.2 26.0 - 34.0 pg   MCHC 34.3 30.0 - 36.0 g/dL   RDW 74.2 59.5 - 63.8 %   Platelets 349 150 - 400 K/uL   nRBC 0.0 0.0 - 0.2 %  Comprehensive metabolic panel   Collection Time: 08/17/22  5:48 PM  Result Value Ref Range   Sodium 135 135 - 145 mmol/L   Potassium 3.9 3.5 - 5.1 mmol/L   Chloride 101 98 - 111 mmol/L   CO2 21 (L) 22 - 32 mmol/L   Glucose, Bld 101 (H) 70 - 99 mg/dL   BUN 8 6 - 20 mg/dL   Creatinine, Ser 7.56 (H) 0.44 - 1.00 mg/dL   Calcium 9.5 8.9 - 43.3 mg/dL   Total Protein 6.7 6.5 - 8.1 g/dL   Albumin 2.7 (L) 3.5 - 5.0 g/dL   AST 21 15 - 41 U/L   ALT 17 0 - 44 U/L   Alkaline Phosphatase 54 38 - 126 U/L   Total Bilirubin 0.4 0.3 - 1.2 mg/dL   GFR, Estimated >29 >51 mL/min   Anion gap 13 5 - 15  Type and screen [facility]   Collection Time: 08/17/22  5:48 PM  Result Value Ref Range   ABO/RH(D) A POS    Antibody Screen NEG    Sample  Expiration      08/20/2022,2359 Performed at [facility], [address], [place], [place] [address]   Glucose, capillary   Collection Time: 08/18/22  1:14 AM  Result Value Ref Range   Glucose-Capillary 106 (H) 70 - 99 mg/dL  Comprehensive metabolic panel   Collection Time: 08/18/22  4:18 AM  Result Value Ref Range   Sodium 133 (L) 135 - 145 mmol/L   Potassium 4.2 3.5 - 5.1 mmol/L   Chloride 101 98 - 111 mmol/L   CO2 20 (L) 22 - 32 mmol/L   Glucose, Bld 144 (H) 70 - 99 mg/dL   BUN 9 6 - 20 mg/dL   Creatinine, Ser 5.78 0.44 - 1.00 mg/dL   Calcium 8.7 (L) 8.9 - 10.3 mg/dL   Total Protein 6.2 (L) 6.5 - 8.1 g/dL   Albumin 2.5 (L) 3.5 - 5.0 g/dL   AST 18 15 - 41 U/L   ALT 16 0 - 44 U/L   Alkaline Phosphatase 48 38 - 126 U/L   Total Bilirubin 0.5 0.3 - 1.2 mg/dL   GFR, Estimated >46 >96 mL/min   Anion gap 12 5 - 15  CBC   Collection Time: 08/18/22  4:18 AM  Result Value Ref Range   WBC 14.5 (H) 4.0 - 10.5 K/uL   RBC 3.48 (L) 3.87 - 5.11 MIL/uL   Hemoglobin  10.5 (L) 12.0 - 15.0 g/dL   HCT 29.5 (L) 28.4 - 13.2 %   MCV 87.4 80.0 - 100.0 fL   MCH 30.2 26.0 - 34.0 pg   MCHC 34.5 30.0 - 36.0 g/dL   RDW 44.0 10.2 - 72.5 %   Platelets 366 150 - 400 K/uL   nRBC 0.0 0.0 - 0.2 %  Hemoglobin A1c   Collection Time: 08/18/22  4:18 AM  Result Value Ref Range   Hgb A1c MFr Bld 5.5 4.8 - 5.6 %   Mean Plasma Glucose 111.15 mg/dL  Glucose, capillary   Collection Time: 08/18/22  8:51 AM  Result Value Ref Range   Glucose-Capillary 146 (H) 70 - 99 mg/dL    Imaging Studies:    No results found.   Medications:  Scheduled  amLODipine  10 mg Oral Daily   betamethasone acetate-betamethasone sodium phosphate  12 mg Intramuscular Q24 Hr x 2   hydrALAZINE  25 mg Oral Q6H   insulin aspart  0-16 Units Subcutaneous TID PC   labetalol  800 mg Oral Q8H   metFORMIN  1,000 mg Oral Q breakfast   metFORMIN  500 mg Oral Q supper   prenatal multivitamin  1 tablet Oral Q1200   I have reviewed the patient's current medications.  ASSESSMENT: Patient Active Problem List   Diagnosis Date Noted   Hypertensive crisis 08/17/2022   Known fetal anomaly, antepartum 06/15/2022   Morbid obesity (HCC) 05/19/2022   Moderate left ventricular hypertrophy 05/19/2022   Kidney disease in pregnancy, unspecified trimester 05/11/2022   Chronic hypertension during pregnancy, antepartum 05/09/2022   AMA (advanced maternal age) primigravida 35+ 05/05/2022   Gestational diabetes mellitus (GDM), antepartum 04/27/2022   Supervision of high risk pregnancy, antepartum 03/30/2022   IDA (iron deficiency anemia) 12/16/2021   SVT (supraventricular tachycardia) 11/01/2020   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

## 2022-08-18 NOTE — Consult Note (Signed)
MFM Consultation  I connected with  Selena Meyer on 08/18/22 by a video enabled telemedicine application and verified that I am speaking with the correct person using two identifiers.   I discussed the limitations of evaluation and management by telemedicine. The patient expressed understanding and agreed to proceed.  Selena Meyer is a 37 yo G1P0 at 30w 2d with an EDD fo 10/25/22. She is seen today at the request of Catalina Antigua, MD due to exacerbation of chronic hypertension in the setting of known chronic kidney disease with proteinuria. In addition, the fetus has ventriculomegaly that is scheduled for an MRI this month.  Selena Meyer pregnancy is complicated also by A2GDM in which she is being treated with insulin and metformin.  Selena Meyer was admitted as she reports that in the outpatient setting she was found to have severe hypertension in the 178/88 mmHg.   While in the MAU she had severe hypertension requiring IV therapy. She her oral therapy was increased and was given magnesium sulfate and betamethasone.  Since that time her blood pressure was has improved not requiring IV therapy.   She reports having a slight headache but it has resolved with tylenol.   She had a recent ultrasound demonstrating an EFW of 26%. Normal amniotic fluid.   Her most recent labs are normal with exception of known proteinuria thought to be related to CKD. She had as slight increased from 0.89 to 1.08.      08/18/2022    5:52 PM 08/18/2022    4:22 PM 08/18/2022    4:00 PM  Vitals with BMI  Systolic 131 113   Diastolic 72 66   Pulse 90 93 88      Latest Ref Rng & Units 08/18/2022    4:18 AM 08/17/2022    5:48 PM 08/05/2022   11:54 AM  CBC  WBC 4.0 - 10.5 K/uL 14.5  13.0  11.3   Hemoglobin 12.0 - 15.0 g/dL 11.9  14.7  82.9   Hematocrit 36.0 - 46.0 % 30.4  32.7  31.1   Platelets 150 - 400 K/uL 366  349  291       Latest Ref Rng & Units 08/18/2022    4:18 AM 08/17/2022    5:48 PM 08/05/2022    3:15 PM  CMP   Glucose 70 - 99 mg/dL 562  130    BUN 6 - 20 mg/dL 9  8    Creatinine 8.65 - 1.00 mg/dL 7.84  6.96  2.95   Sodium 135 - 145 mmol/L 133  135    Potassium 3.5 - 5.1 mmol/L 4.2  3.9    Chloride 98 - 111 mmol/L 101  101    CO2 22 - 32 mmol/L 20  21    Calcium 8.9 - 10.3 mg/dL 8.7  9.5    Total Protein 6.5 - 8.1 g/dL 6.2  6.7    Total Bilirubin 0.3 - 1.2 mg/dL 0.5  0.4    Alkaline Phos 38 - 126 U/L 48  54    AST 15 - 41 U/L 18  21    ALT 0 - 44 U/L 16  17      Past Medical History:  Diagnosis Date   Anemia    Elevated cholesterol with high triglycerides 04/27/2020   Episodic tension-type headache, not intractable 08/06/2017   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 FSGS vs MCD, biopsy 09/2017 podocytopathy but  nondiagnostic.    Gestational diabetes    Hypertension    Hypokalemia 11/02/2020   Irregular menstrual cycle    Irregular periods/menstrual cycles 05/03/2017   Will hold off on Provera or other fertility stuff for now, pt advised pregnancy is not recommended until BP under better control at least, plus other workup pen   Polycystic ovaries 11/09/2015   Proteinuria 02/28/2013   Stage 2 chronic kidney disease 04/27/2020   SVT (supraventricular tachycardia) 11/01/2020   Systolic murmur 07/16/2017   Tobacco abuse 02/28/2013   Uncontrolled hypertension 05/10/2017   Past Surgical History:  Procedure Laterality Date   NO PAST SURGERIES     Family History  Problem Relation Age of Onset   Diabetes Mother    High blood pressure Mother    Renal Disease Mother    Diabetes Sister    Cancer Maternal Grandmother    Diabetes Maternal Grandmother    High blood pressure Maternal Grandmother    Cancer Maternal Aunt        breast   Other Father    OB History  Gravida Para Term Preterm AB Living  1 0 0 0 0 0  SAB IAB Ectopic Multiple Live Births  0 0 0 0 0    # Outcome Date GA Lbr Len/2nd Weight Sex  Type Anes PTL Lv  1 Current            Family History  Problem Relation Age of Onset   Diabetes Mother    High blood pressure Mother    Renal Disease Mother    Diabetes Sister    Cancer Maternal Grandmother    Diabetes Maternal Grandmother    High blood pressure Maternal Grandmother    Cancer Maternal Aunt        breast   Other Father    Social History   Socioeconomic History   Marital status: Married    Spouse name: Ivar Drape   Number of children: 0   Years of education: Not on file   Highest education level: Not on file  Occupational History    Employer: UNEMPLOYED  Tobacco Use   Smoking status: Former    Current packs/day: 0.00    Average packs/day: 0.3 packs/day for 5.0 years (1.3 ttl pk-yrs)    Types: Cigarettes    Start date: 05/16/2016    Quit date: 05/16/2021    Years since quitting: 1.2   Smokeless tobacco: Never   Tobacco comments:    Quit smoking 2023/06/09after her mom passed away  Vaping Use   Vaping status: Never Used  Substance and Sexual Activity   Alcohol use: Not Currently    Comment: "occasional use; social drinker"   Drug use: No   Sexual activity: Yes    Partners: Male    Birth control/protection: None    Comment: Pregnant  Other Topics Concern   Not on file  Social History Narrative   Not on file   Social Determinants of Health   Financial Resource Strain: Low Risk  (05/08/2022)   [facility], [facility]   Overall Financial Resource Strain (CARDIA)    Difficulty of Paying Living Expenses: Not very hard  Food Insecurity: No Food Insecurity (08/18/2022)   Hunger Vital Sign    Worried About Running Out of Food in the Last Year: Never true    Ran Out of Food in the Last Year: Never true  Transportation Needs: No Transportation Needs (08/18/2022)   PRAPARE - Transportation  Lack of Transportation (Medical): No    Lack of Transportation (Non-Medical): No  Physical Activity: Unknown (11/03/2020)   Received from [facility],  [facility]   Exercise Vital Sign    Days of Exercise per Week: 5 days    Minutes of Exercise per Session: Patient declined  Stress: Stress Concern Present (11/03/2020)   Received from [facility] - Occupational Stress Questionnaire    Feeling of Stress : To some extent  Social Connections: Unknown (05/10/2021)   Received from [facility], [facility]   Social Network    Social Network: Not on file  Intimate Partner Violence: Not At Risk (08/18/2022)   Humiliation, Afraid, Rape, and Kick questionnaire    Fear of Current or Ex-Partner: No    Emotionally Abused: No    Physically Abused: No    Sexually Abused: No    Current Facility-Administered Medications (Endocrine & Metabolic):    betamethasone acetate-betamethasone sodium phosphate (CELESTONE) injection 12 mg   CBG monitoring **AND** insulin aspart (novoLOG) injection 0-16 Units   metFORMIN (GLUCOPHAGE) tablet 1,000 mg   metFORMIN (GLUCOPHAGE) tablet 500 mg   Current Facility-Administered Medications (Cardiovascular):    amLODipine (NORVASC) tablet 10 mg   hydrALAZINE (APRESOLINE) injection 5 mg **AND** hydrALAZINE (APRESOLINE) injection 10 mg **AND** labetalol (NORMODYNE) injection 20 mg **AND** labetalol (NORMODYNE) injection 40 mg **AND** Measure blood pressure   hydrALAZINE (APRESOLINE) tablet 25 mg   labetalol (NORMODYNE) tablet 800 mg     Current Facility-Administered Medications (Analgesics):    acetaminophen (TYLENOL) tablet 650 mg     Current Facility-Administered Medications (Other):    calcium carbonate (TUMS - dosed in mg elemental calcium) chewable tablet 400 mg of elemental calcium   lactated ringers infusion   lactated ringers infusion   magnesium sulfate 40 grams in SWI 1000 mL OB infusion   ondansetron (ZOFRAN) injection 4 mg   prenatal multivitamin tablet 1 tablet  No current outpatient medications on file.  Allergies  Allergen  Reactions   Ibuprofen Hives     Impression/Counseling; Exacerbation of chronic hypertension with known chronic kidney disease.   I discussed with Selena Meyer the diagnosis of chronic hypertension vs that of superimposed preeclampsia. We discussed the distinction is difficult in the setting of known proteinuria as she has proteinuria at 0.86 month ago. Most recently it is 1.08 which can be due to pregnancy progression given the increased GFR.   Given that there is a slight rise in her proteinuria I discussed that her likely diagnosis is exacerbation of chronic hypertension. All of her other labs are normal, however, if her headache doesn't resolve we discussed that she would have CNS signs which would be an indication for delivery.  At this time I agree with Dr. Deretha Emory plan for ongoing magnesium sulfate while she receives a course of betamethasone.   More importantly, Selena Meyer hypertension therapy is maxed out across 4 medications, with a most recent increase in labetalol to 800 TID, and a new addition of oral hydralazine. I do also agree with Dr. Jolayne Panther to exchange her Norvasc 10 mg daily for Procardia. 30 mg XL, with careful attention to maintain her pulse pressure average 135/85 mmHg.  I explained to SelenaMeyer that we would recommend delivery if her blood pressure could not be controlled or if she developed abnormal labs.  She will complete her steroid course on Saturday at 9 pm. If her blood pressure is better stabilized, consider discharge with weekly  labs and 2x weekly testing.   Consider delivery if she has a significant exacerbation and after 34 weeks. If prior to 34 weeks consider inpatient admission with a goal for stabilizing her blood pressure and delivery at 34 weeks. However, if blood pressure cannot be controlled consider delivery and second course of BMZ administration if it been 7 days since her prior dose.   I spent 60 minutes with >50 % in direct communication, review of  records and care coordination.  All questions answered.   I discussed this plan of care directly with Dr. Jolayne Panther as well.  Novella Olive, MD

## 2022-08-19 DIAGNOSIS — O169 Unspecified maternal hypertension, unspecified trimester: Secondary | ICD-10-CM | POA: Diagnosis present

## 2022-08-19 LAB — BASIC METABOLIC PANEL
Anion gap: 13 (ref 5–15)
BUN: 18 mg/dL (ref 6–20)
CO2: 17 mmol/L — ABNORMAL LOW (ref 22–32)
Calcium: 8.7 mg/dL — ABNORMAL LOW (ref 8.9–10.3)
Chloride: 103 mmol/L (ref 98–111)
Creatinine, Ser: 1.59 mg/dL — ABNORMAL HIGH (ref 0.44–1.00)
GFR, Estimated: 42 mL/min — ABNORMAL LOW (ref >60)
Glucose, Bld: 153 mg/dL — ABNORMAL HIGH (ref 70–99)
Potassium: 4.3 mmol/L (ref 3.5–5.1)
Sodium: 133 mmol/L — ABNORMAL LOW (ref 135–145)

## 2022-08-19 LAB — GLUCOSE, CAPILLARY
Glucose-Capillary: 127 mg/dL — ABNORMAL HIGH (ref 70–99)
Glucose-Capillary: 171 mg/dL — ABNORMAL HIGH (ref 70–99)
Glucose-Capillary: 170 mg/dL — ABNORMAL HIGH (ref 70–99)
Glucose-Capillary: 150 mg/dL — ABNORMAL HIGH (ref 70–99)
Glucose-Capillary: 192 mg/dL — ABNORMAL HIGH (ref 70–99)

## 2022-08-19 MED ORDER — NIFEDIPINE ER OSMOTIC RELEASE 30 MG PO TB24
30.0000 mg | ORAL_TABLET | Freq: Every day | ORAL | Status: DC
  Administered 2022-08-19 – 2022-08-21 (×3): 30 mg via ORAL
  Filled 2022-08-19 (×4): qty 1

## 2022-08-19 MED ORDER — NIFEDIPINE ER OSMOTIC RELEASE 60 MG PO TB24: 60.0000 mg | ORAL_TABLET | Freq: Every day | ORAL | Status: DC

## 2022-08-19 MED ORDER — DOCUSATE SODIUM 100 MG PO CAPS: 100.0000 mg | ORAL_CAPSULE | Freq: Two times a day (BID) | ORAL | Status: DC | PRN

## 2022-08-19 MED ORDER — DOCUSATE SODIUM 100 MG PO CAPS
100.0000 mg | ORAL_CAPSULE | Freq: Every day | ORAL | Status: DC
  Administered 2022-08-19 – 2022-08-20 (×2): 100 mg via ORAL
  Filled 2022-08-19 (×2): qty 1

## 2022-08-19 MED ORDER — INSULIN GLARGINE-YFGN 100 UNIT/ML ~~LOC~~ SOLN
6.0000 [IU] | Freq: Two times a day (BID) | SUBCUTANEOUS | Status: DC
  Administered 2022-08-19 (×2): 6 [IU] via SUBCUTANEOUS
  Filled 2022-08-19 (×4): qty 0.06

## 2022-08-19 NOTE — Progress Notes (Signed)
FACULTY PRACTICE ANTEPARTUM COMPREHENSIVE PROGRESS NOTE  Selena Meyer is a 37 y.o. G1P0 at [redacted]w[redacted]d who is admitted for worsening chronic hypertension vs superimposed preeclampsia with severe features (BP).  Estimated Date of Delivery: 10/25/22  Length of Stay:  2 Days. Admitted 08/17/2022  Subjective: No complaints this AM. Denies HA, visual changes, CP/SOB, RUQ/epigastric pain. Patient reports good fetal movement.  She reports no uterine contractions, no bleeding and no loss of fluid per vagina.  Vitals:  Blood pressure 137/63, pulse 97, temperature 98.5 F (36.9 C), temperature source Oral, resp. rate 18, height 5\' 5"  (1.651 m), weight 87.5 kg, last menstrual period 01/18/2022, SpO2 100%. Physical Examination: CONSTITUTIONAL: Well-developed, well-nourished female in no acute distress.  NEUROLOGIC: Alert and oriented to person, place, and time. CARDIOVASCULAR: Normal heart rate noted RESPIRATORY: Effort normal, no problems with respiration noted ABDOMEN: Soft, nontender, nondistended, gravid.  Fetal monitoring: FHR: 150 bpm, Variability: moderate, Accelerations: 10x10, Decelerations: Absent  Uterine activity: flat  Results for orders placed or performed during the hospital encounter of 08/17/22 (from the past 48 hour(s))  Protein / creatinine ratio, urine     Status: Abnormal   Collection Time: 08/17/22  5:42 PM  Result Value Ref Range   Creatinine, Urine 140 mg/dL   Total Protein, Urine 151 mg/dL    Comment: RESULT CONFIRMED BY MANUAL DILUTION NO NORMAL RANGE ESTABLISHED FOR THIS TEST    Protein Creatinine Ratio 1.08 (H) 0.00 - 0.15 mg/mg[Cre]    Comment: Performed at [facility], [address] [place], [place] [address]  CBC     Status: Abnormal   Collection Time: 08/17/22  5:48 PM  Result Value Ref Range   WBC 13.0 (H) 4.0 - 10.5 K/uL   RBC 3.59 (L) 3.87 - 5.11 MIL/uL   Hemoglobin 11.2 (L) 12.0 - 15.0 g/dL   HCT 60.4 (L) 54.0 - 98.1 %   MCV 91.1 80.0 - 100.0 fL    MCH 31.2 26.0 - 34.0 pg   MCHC 34.3 30.0 - 36.0 g/dL   RDW 19.1 47.8 - 29.5 %   Platelets 349 150 - 400 K/uL   nRBC 0.0 0.0 - 0.2 %    Comment: Performed at [facility], [address] [place], [place] [address]  Comprehensive metabolic panel     Status: Abnormal   Collection Time: 08/17/22  5:48 PM  Result Value Ref Range   Sodium 135 135 - 145 mmol/L   Potassium 3.9 3.5 - 5.1 mmol/L   Chloride 101 98 - 111 mmol/L   CO2 21 (L) 22 - 32 mmol/L   Glucose, Bld 101 (H) 70 - 99 mg/dL    Comment: Glucose reference range applies only to samples taken after fasting for at least 8 hours.   BUN 8 6 - 20 mg/dL   Creatinine, Ser 8.65 (H) 0.44 - 1.00 mg/dL   Calcium 9.5 8.9 - 78.4 mg/dL   Total Protein 6.7 6.5 - 8.1 g/dL   Albumin 2.7 (L) 3.5 - 5.0 g/dL   AST 21 15 - 41 U/L   ALT 17 0 - 44 U/L   Alkaline Phosphatase 54 38 - 126 U/L   Total Bilirubin 0.4 0.3 - 1.2 mg/dL   GFR, Estimated >69 >62 mL/min    Comment: (NOTE) Calculated using the CKD-EPI Creatinine Equation (2021)    Anion gap 13 5 - 15    Comment: Performed at [facility], [address], [place], [place] [address]  Type  and screen [facility]     Status: None   Collection Time: 08/17/22  5:48 PM  Result Value Ref Range   ABO/RH(D) A POS    Antibody Screen NEG    Sample Expiration      08/20/2022,2359 Performed at [facility], [address], [place], [place] [address]   Glucose, capillary     Status: Abnormal   Collection Time: 08/18/22  1:14 AM  Result Value Ref Range   Glucose-Capillary 106 (H) 70 - 99 mg/dL    Comment: Glucose reference range applies only to samples taken after fasting for at least 8 hours.  Comprehensive metabolic panel     Status: Abnormal   Collection Time: 08/18/22  4:18 AM  Result Value Ref Range   Sodium 133 (L) 135 - 145 mmol/L   Potassium 4.2 3.5 - 5.1 mmol/L   Chloride 101 98 - 111 mmol/L   CO2 20 (L) 22 - 32 mmol/L   Glucose, Bld 144 (H) 70 - 99  mg/dL    Comment: Glucose reference range applies only to samples taken after fasting for at least 8 hours.   BUN 9 6 - 20 mg/dL   Creatinine, Ser 9.14 0.44 - 1.00 mg/dL   Calcium 8.7 (L) 8.9 - 10.3 mg/dL   Total Protein 6.2 (L) 6.5 - 8.1 g/dL   Albumin 2.5 (L) 3.5 - 5.0 g/dL   AST 18 15 - 41 U/L   ALT 16 0 - 44 U/L   Alkaline Phosphatase 48 38 - 126 U/L   Total Bilirubin 0.5 0.3 - 1.2 mg/dL   GFR, Estimated >78 >29 mL/min    Comment: (NOTE) Calculated using the CKD-EPI Creatinine Equation (2021)    Anion gap 12 5 - 15    Comment: Performed at [facility], [address] [place] [address], [place], [place] [address]  CBC     Status: Abnormal   Collection Time: 08/18/22  4:18 AM  Result Value Ref Range   WBC 14.5 (H) 4.0 - 10.5 K/uL   RBC 3.48 (L) 3.87 - 5.11 MIL/uL   Hemoglobin 10.5 (L) 12.0 - 15.0 g/dL   HCT 08.6 (L) 57.8 - 46.9 %   MCV 87.4 80.0 - 100.0 fL   MCH 30.2 26.0 - 34.0 pg   MCHC 34.5 30.0 - 36.0 g/dL   RDW 62.9 52.8 - 41.3 %   Platelets 366 150 - 400 K/uL   nRBC 0.0 0.0 - 0.2 %    Comment: Performed at [facility], [address], [place], [place] [address]  Hemoglobin A1c     Status: None   Collection Time: 08/18/22  4:18 AM  Result Value Ref Range   Hgb A1c MFr Bld 5.5 4.8 - 5.6 %    Comment: (NOTE) Pre diabetes:          5.7%-6.4%  Diabetes:              >6.4%  Glycemic control for   <7.0% adults with diabetes    Mean Plasma Glucose 111.15 mg/dL    Comment: Performed at [facility], [address] [place], [place] [address]  Glucose, capillary     Status: Abnormal   Collection Time: 08/18/22  8:51 AM  Result Value Ref Range   Glucose-Capillary 146 (H) 70 - 99 mg/dL    Comment: Glucose reference range applies only to samples taken after fasting for at least 8 hours.  Glucose, capillary  Status: Abnormal   Collection Time: 08/18/22 11:46 AM  Result Value Ref Range   Glucose-Capillary 118 (H) 70 - 99 mg/dL    Comment: Glucose reference range  applies only to samples taken after fasting for at least 8 hours.  Glucose, capillary     Status: Abnormal   Collection Time: 08/18/22  2:12 PM  Result Value Ref Range   Glucose-Capillary 105 (H) 70 - 99 mg/dL    Comment: Glucose reference range applies only to samples taken after fasting for at least 8 hours.  Glucose, capillary     Status: Abnormal   Collection Time: 08/18/22  6:35 PM  Result Value Ref Range   Glucose-Capillary 117 (H) 70 - 99 mg/dL    Comment: Glucose reference range applies only to samples taken after fasting for at least 8 hours.  Magnesium     Status: Abnormal   Collection Time: 08/18/22  6:52 PM  Result Value Ref Range   Magnesium 6.1 (HH) 1.7 - 2.4 mg/dL    Comment: CRITICAL RESULT CALLED TO, READ BACK BY AND VERIFIED WITH Levi Aland RN 08/18/2022 1954 BNUNNERY Performed at [facility], [address], [place], [place] [address]   Glucose, capillary     Status: Abnormal   Collection Time: 08/18/22 10:32 PM  Result Value Ref Range   Glucose-Capillary 102 (H) 70 - 99 mg/dL    Comment: Glucose reference range applies only to samples taken after fasting for at least 8 hours.  Comprehensive metabolic panel     Status: Abnormal   Collection Time: 08/19/22  4:23 AM  Result Value Ref Range   Sodium 131 (L) 135 - 145 mmol/L   Potassium 4.4 3.5 - 5.1 mmol/L   Chloride 99 98 - 111 mmol/L   CO2 20 (L) 22 - 32 mmol/L   Glucose, Bld 145 (H) 70 - 99 mg/dL    Comment: Glucose reference range applies only to samples taken after fasting for at least 8 hours.   BUN 12 6 - 20 mg/dL   Creatinine, Ser 1.91 (H) 0.44 - 1.00 mg/dL   Calcium 8.4 (L) 8.9 - 10.3 mg/dL   Total Protein 6.3 (L) 6.5 - 8.1 g/dL   Albumin 2.6 (L) 3.5 - 5.0 g/dL   AST 16 15 - 41 U/L   ALT 16 0 - 44 U/L   Alkaline Phosphatase 50 38 - 126 U/L   Total Bilirubin 0.5 0.3 - 1.2 mg/dL   GFR, Estimated 48 (L) >60 mL/min    Comment: (NOTE) Calculated using the CKD-EPI Creatinine Equation (2021)    Anion  gap 12 5 - 15    Comment: Performed at [facility], [address], [place], [place] [address]  CBC     Status: Abnormal   Collection Time: 08/19/22  4:23 AM  Result Value Ref Range   WBC 14.8 (H) 4.0 - 10.5 K/uL   RBC 3.40 (L) 3.87 - 5.11 MIL/uL   Hemoglobin 10.3 (L) 12.0 - 15.0 g/dL   HCT 56.2 (L) 13.0 - 86.5 %   MCV 89.4 80.0 - 100.0 fL   MCH 30.3 26.0 - 34.0 pg   MCHC 33.9 30.0 - 36.0 g/dL   RDW 78.4 69.6 - 29.5 %   Platelets 367 150 - 400 K/uL   nRBC 0.0 0.0 - 0.2 %    Comment: Performed at [facility], [address] [place], [place] [address]  Glucose, capillary     Status: Abnormal  Collection Time: 08/19/22  7:41 AM  Result Value Ref Range   Glucose-Capillary 127 (H) 70 - 99 mg/dL    Comment: Glucose reference range applies only to samples taken after fasting for at least 8 hours.  Glucose, capillary     Status: Abnormal   Collection Time: 08/19/22 11:02 AM  Result Value Ref Range   Glucose-Capillary 171 (H) 70 - 99 mg/dL    Comment: Glucose reference range applies only to samples taken after fasting for at least 8 hours.   No results found.  Current scheduled medications  docusate sodium  100 mg Oral Daily   hydrALAZINE  25 mg Oral Q6H   insulin aspart  0-16 Units Subcutaneous TID PC   insulin glargine-yfgn  6 Units Subcutaneous BID   labetalol  800 mg Oral Q8H   NIFEdipine  30 mg Oral Daily   prenatal multivitamin  1 tablet Oral Q1200    I have reviewed the patient's current medications.  ASSESSMENT: Principal Problem:   Hypertensive crisis Active Problems:   FSGS (focal segmental glomerulosclerosis)   Gestational diabetes mellitus (GDM), antepartum   Chronic hypertension during pregnancy, antepartum   Kidney disease in pregnancy, unspecified trimester   Morbid obesity (HCC)   Hypertension affecting pregnancy  PLAN: 37 yo G1P0 at [redacted]w[redacted]d with worsening cHTN vs SIPE w/ SF (BP), CKD/FSGS, GDMA2  Worsening cHTN vs SIPE, CKD - asymptomatic,  s/p mag 4/2 x 24h - will transition from amlodipine 10mg  to procardia 30mg  today and titrate prn. BP currently well controlled on labetalol 800mg  TID, procardia XL 30mg  daily, and hydral 25mg  q6h - labs this AM notable for Cr 1.43 from 0.99. UOP adequate. Suspect this is related to newly controlled BP and addition of hydral, but will recheck this afternoon. Cr is < 2x her baseline, so delivery is not yet indicated - MFM consulted  2. GDMA2 - dc metformin w/ Cr 1.43 - semglee 6u BID w/ SSI for meals (anticipate bump in BGs w/ recent BMZ) - DM coordinator following  3. FWB - S/p mag as above & BMZ 8/8-9 - NST BID - Growth 7/25 @28 /1 1132g (26%), AC 51% - Known fetal ventriculomegaly, planned for outpatient fetal MRI 8/27 - Will consult NICU if moving towards delivery  Continue routine antenatal care.  Harvie Bridge, MD Obstetrician & Gynecologist, [facility] for [employer], [facility]

## 2022-08-19 NOTE — Inpatient Diabetes Management (Addendum)
Inpatient Diabetes Program Recommendations  ADA Standards of Care 2024 Diabetes in Pregnancy Target Glucose Ranges:  Fasting: 70 - 95 mg/dL 1 hr postprandial:  161 - 140mg /dL (from first bite of meal) 2 hr postprandial:  100 - 120 mg/dL (from first bit of meal)    Lab Results  Component Value Date   GLUCAP 171 (H) 08/19/2022   HGBA1C 5.5 08/18/2022    Review of Glycemic Control  Latest Reference Range & Units 08/18/22 14:12 08/18/22 18:35 08/18/22 22:32 08/19/22 07:41 08/19/22 11:02  Glucose-Capillary 70 - 99 mg/dL 096 (H) 045 (H) 409 (H) 127 (H) 171 (H)   Diabetes history: GDM Outpatient Diabetes medications: Metformin 1000 mg QAM and 500 mg QPM Current orders for Inpatient glycemic control: Novolog 0-16 units Q4H PRN Semglee 6 units bid Betamethasone 12.5 mg first does on 8/8 @ 2243; 2nd dose 8/09 @ 2224  30/2 weeks HTN  Note; metformin d/c'd increase in renal function. 0.99 to 1.43  Inpatient Diabetes Program Recommendations:    Noted addition of Semglee this am, consider:  May need prandial insulin coverage if glucose trends increase after meal intake  Will continue to follow while inpatient.  Thanks, Christena Deem RN, MSN, BC-ADM Inpatient Diabetes Coordinator Team Pager [phone] (8a-5p)

## 2022-08-20 LAB — GLUCOSE, CAPILLARY
Glucose-Capillary: 109 mg/dL — ABNORMAL HIGH (ref 70–99)
Glucose-Capillary: 142 mg/dL — ABNORMAL HIGH (ref 70–99)
Glucose-Capillary: 141 mg/dL — ABNORMAL HIGH (ref 70–99)
Glucose-Capillary: 165 mg/dL — ABNORMAL HIGH (ref 70–99)
Glucose-Capillary: 109 mg/dL — ABNORMAL HIGH (ref 70–99)

## 2022-08-20 LAB — COMPREHENSIVE METABOLIC PANEL WITH GFR
ALT: 13 U/L (ref 0–44)
AST: 15 U/L (ref 15–41)
Albumin: 2.3 g/dL — ABNORMAL LOW (ref 3.5–5.0)
Alkaline Phosphatase: 45 U/L (ref 38–126)
Anion gap: 10 (ref 5–15)
BUN: 20 mg/dL (ref 6–20)
CO2: 20 mmol/L — ABNORMAL LOW (ref 22–32)
Calcium: 8.9 mg/dL (ref 8.9–10.3)
Chloride: 104 mmol/L (ref 98–111)
Creatinine, Ser: 1.24 mg/dL — ABNORMAL HIGH (ref 0.44–1.00)
GFR, Estimated: 57 mL/min — ABNORMAL LOW (ref >60)
Glucose, Bld: 124 mg/dL — ABNORMAL HIGH (ref 70–99)
Potassium: 3.9 mmol/L (ref 3.5–5.1)
Sodium: 134 mmol/L — ABNORMAL LOW (ref 135–145)
Total Bilirubin: 0.4 mg/dL (ref 0.3–1.2)
Total Protein: 5.6 g/dL — ABNORMAL LOW (ref 6.5–8.1)

## 2022-08-20 LAB — CBC
HCT: 26.6 % — ABNORMAL LOW (ref 36.0–46.0)
Hemoglobin: 9.1 g/dL — ABNORMAL LOW (ref 12.0–15.0)
MCH: 31.4 pg (ref 26.0–34.0)
MCHC: 34.2 g/dL (ref 30.0–36.0)
MCV: 91.7 fL (ref 80.0–100.0)
Platelets: 320 10*3/uL (ref 150–400)
RBC: 2.9 MIL/uL — ABNORMAL LOW (ref 3.87–5.11)
RDW: 13 % (ref 11.5–15.5)
WBC: 14.7 10*3/uL — ABNORMAL HIGH (ref 4.0–10.5)
nRBC: 0 % (ref 0.0–0.2)

## 2022-08-20 MED ORDER — INSULIN GLARGINE-YFGN 100 UNIT/ML ~~LOC~~ SOLN
8.0000 [IU] | Freq: Two times a day (BID) | SUBCUTANEOUS | Status: DC
  Administered 2022-08-20 – 2022-08-23 (×7): 8 [IU] via SUBCUTANEOUS
  Filled 2022-08-20 (×10): qty 0.08

## 2022-08-20 MED ORDER — INSULIN ASPART 100 UNIT/ML IJ SOLN
4.0000 [IU] | Freq: Three times a day (TID) | INTRAMUSCULAR | Status: DC
  Administered 2022-08-20 – 2022-08-21 (×4): 4 [IU] via SUBCUTANEOUS

## 2022-08-20 MED ORDER — DIPHENHYDRAMINE HCL 25 MG PO CAPS
50.0000 mg | ORAL_CAPSULE | Freq: Once | ORAL | Status: AC
  Administered 2022-08-20: 50 mg via ORAL
  Filled 2022-08-20: qty 2

## 2022-08-20 MED ORDER — METOCLOPRAMIDE HCL 10 MG PO TABS
10.0000 mg | ORAL_TABLET | Freq: Once | ORAL | Status: AC
  Administered 2022-08-20: 10 mg via ORAL
  Filled 2022-08-20: qty 1

## 2022-08-20 NOTE — Progress Notes (Signed)
FACULTY PRACTICE ANTEPARTUM PROGRESS NOTE  Selena Meyer is a 37 y.o. G1P0 at [redacted]w[redacted]d who is admitted for worsening chronic hypertension vs superimposed preeclampsia.  Estimated Date of Delivery: 10/25/22 Fetal presentation is  unknown .  Length of Stay:  3 Days. Admitted 08/17/2022  Subjective: Pt resting quietly.  She denies headache, visual changes and RUQ pain. Patient reports normal fetal movement.  She denies uterine contractions, denies bleeding and leaking of fluid per vagina.  Vitals:  Blood pressure (!) 141/77, pulse 92, temperature 98.6 F (37 C), temperature source Oral, resp. rate (!) 49, height 5\' 5"  (1.651 m), weight 87.5 kg, last menstrual period 01/18/2022, SpO2 99%. Physical Examination: CONSTITUTIONAL: Well-developed, well-nourished female in no acute distress.  HENT:  Normocephalic, atraumatic, External right and left ear normal. Oropharynx is clear and moist EYES: Conjunctivae and EOM are normal.  NECK: Normal range of motion, supple, no masses. SKIN: Skin is warm and dry. No rash noted. Not diaphoretic. No erythema. No pallor. NEUROLGIC: Alert and oriented to person, place, and time. Normal reflexes, muscle tone coordination. No cranial nerve deficit noted. PSYCHIATRIC: Normal mood and affect. Normal behavior. Normal judgment and thought content. CARDIOVASCULAR: Normal heart rate noted, regular rhythm RESPIRATORY: Effort and breath sounds normal, no problems with respiration noted MUSCULOSKELETAL: Normal range of motion. No edema and no tenderness. ABDOMEN: Soft, nontender, nondistended, gravid. CERVIX: deferred  Fetal monitoring: FHR: 140s bpm, Variability: moderate, Accelerations: Present, Decelerations: occasional small variable decel Uterine activity: none     Current Facility-Administered Medications:    acetaminophen (TYLENOL) tablet 650 mg, 650 mg, Oral, Q4H PRN, Bernerd Limbo, CNM, 650 mg at 08/18/22 0046   calcium carbonate (TUMS - dosed in mg  elemental calcium) chewable tablet 400 mg of elemental calcium, 2 tablet, Oral, Q4H PRN, Walker, Jamilla R, CNM   docusate sodium (COLACE) capsule 100 mg, 100 mg, Oral, Daily, Adam Phenix, MD, 100 mg at 08/19/22 1410   docusate sodium (COLACE) capsule 100 mg, 100 mg, Oral, BID PRN, Adam Phenix, MD   hydrALAZINE (APRESOLINE) injection 5 mg, 5 mg, Intravenous, PRN, 5 mg at 08/17/22 1744 **AND** hydrALAZINE (APRESOLINE) injection 10 mg, 10 mg, Intravenous, PRN, 10 mg at 08/17/22 1802 **AND** labetalol (NORMODYNE) injection 20 mg, 20 mg, Intravenous, PRN, 20 mg at 08/17/22 1833 **AND** labetalol (NORMODYNE) injection 40 mg, 40 mg, Intravenous, PRN **AND** Measure blood pressure, , , Once, Walker, Jamilla R, CNM   hydrALAZINE (APRESOLINE) tablet 25 mg, 25 mg, Oral, Q6H, Walker, Jamilla R, CNM, 25 mg at 08/20/22 0305   CBG monitoring, , , 4x Daily pc & hs **AND** insulin aspart (novoLOG) injection 0-16 Units, 0-16 Units, Subcutaneous, TID PC, Constant, Peggy, MD, 3 Units at 08/19/22 1851   insulin glargine-yfgn (SEMGLEE) injection 6 Units, 6 Units, Subcutaneous, BID, Harvie Bridge C, MD, 6 Units at 08/19/22 2140   labetalol (NORMODYNE) tablet 800 mg, 800 mg, Oral, Q8H, Walker, Jamilla R, CNM, 800 mg at 08/20/22 0102   lactated ringers infusion, , Intravenous, Continuous, Walker, Jamilla R, CNM   lactated ringers infusion, , Intravenous, Continuous, Walker, Braden, CNM, Stopped at 08/18/22 2016   NIFEdipine (PROCARDIA-XL/NIFEDICAL-XL) 24 hr tablet 30 mg, 30 mg, Oral, Daily, Harvie Bridge C, MD, 30 mg at 08/19/22 1040   ondansetron (ZOFRAN) injection 4 mg, 4 mg, Intravenous, Q6H PRN, Anyanwu, Ugonna A, MD, 4 mg at 08/18/22 1759   prenatal multivitamin tablet 1 tablet, 1 tablet, Oral, Q1200, Edd Arbour R, CNM, 1 tablet at 08/19/22 1040  Results for orders placed or performed during the hospital encounter of 08/17/22 (from the past 48 hour(s))  Glucose, capillary     Status: Abnormal    Collection Time: 08/18/22  8:51 AM  Result Value Ref Range   Glucose-Capillary 146 (H) 70 - 99 mg/dL    Comment: Glucose reference range applies only to samples taken after fasting for at least 8 hours.  Glucose, capillary     Status: Abnormal   Collection Time: 08/18/22 11:46 AM  Result Value Ref Range   Glucose-Capillary 118 (H) 70 - 99 mg/dL    Comment: Glucose reference range applies only to samples taken after fasting for at least 8 hours.  Glucose, capillary     Status: Abnormal   Collection Time: 08/18/22  2:12 PM  Result Value Ref Range   Glucose-Capillary 105 (H) 70 - 99 mg/dL    Comment: Glucose reference range applies only to samples taken after fasting for at least 8 hours.  Glucose, capillary     Status: Abnormal   Collection Time: 08/18/22  6:35 PM  Result Value Ref Range   Glucose-Capillary 117 (H) 70 - 99 mg/dL    Comment: Glucose reference range applies only to samples taken after fasting for at least 8 hours.  Magnesium     Status: Abnormal   Collection Time: 08/18/22  6:52 PM  Result Value Ref Range   Magnesium 6.1 (HH) 1.7 - 2.4 mg/dL    Comment: CRITICAL RESULT CALLED TO, READ BACK BY AND VERIFIED WITH Levi Aland RN 08/18/2022 1954 BNUNNERY Performed at [facility], [address], [place], [place] [address]   Glucose, capillary     Status: Abnormal   Collection Time: 08/18/22 10:32 PM  Result Value Ref Range   Glucose-Capillary 102 (H) 70 - 99 mg/dL    Comment: Glucose reference range applies only to samples taken after fasting for at least 8 hours.  Comprehensive metabolic panel     Status: Abnormal   Collection Time: 08/19/22  4:23 AM  Result Value Ref Range   Sodium 131 (L) 135 - 145 mmol/L   Potassium 4.4 3.5 - 5.1 mmol/L   Chloride 99 98 - 111 mmol/L   CO2 20 (L) 22 - 32 mmol/L   Glucose, Bld 145 (H) 70 - 99 mg/dL    Comment: Glucose reference range applies only to samples taken after fasting for at least 8 hours.   BUN 12 6 - 20 mg/dL    Creatinine, Ser 9.14 (H) 0.44 - 1.00 mg/dL   Calcium 8.4 (L) 8.9 - 10.3 mg/dL   Total Protein 6.3 (L) 6.5 - 8.1 g/dL   Albumin 2.6 (L) 3.5 - 5.0 g/dL   AST 16 15 - 41 U/L   ALT 16 0 - 44 U/L   Alkaline Phosphatase 50 38 - 126 U/L   Total Bilirubin 0.5 0.3 - 1.2 mg/dL   GFR, Estimated 48 (L) >60 mL/min    Comment: (NOTE) Calculated using the CKD-EPI Creatinine Equation (2021)    Anion gap 12 5 - 15    Comment: Performed at [facility], [address], [place], [place] [address]  CBC     Status: Abnormal   Collection Time: 08/19/22  4:23 AM  Result Value Ref Range   WBC 14.8 (H) 4.0 - 10.5 K/uL   RBC 3.40 (L) 3.87 - 5.11 MIL/uL   Hemoglobin 10.3 (L) 12.0 - 15.0 g/dL   HCT 62.1 (L) 30.8 - 65.7 %  MCV 89.4 80.0 - 100.0 fL   MCH 30.3 26.0 - 34.0 pg   MCHC 33.9 30.0 - 36.0 g/dL   RDW 16.6 06.3 - 01.6 %   Platelets 367 150 - 400 K/uL   nRBC 0.0 0.0 - 0.2 %    Comment: Performed at [facility], [address], [place], [place] [address]  Glucose, capillary     Status: Abnormal   Collection Time: 08/19/22  7:41 AM  Result Value Ref Range   Glucose-Capillary 127 (H) 70 - 99 mg/dL    Comment: Glucose reference range applies only to samples taken after fasting for at least 8 hours.  Glucose, capillary     Status: Abnormal   Collection Time: 08/19/22 11:02 AM  Result Value Ref Range   Glucose-Capillary 171 (H) 70 - 99 mg/dL    Comment: Glucose reference range applies only to samples taken after fasting for at least 8 hours.  Glucose, capillary     Status: Abnormal   Collection Time: 08/19/22  2:39 PM  Result Value Ref Range   Glucose-Capillary 170 (H) 70 - 99 mg/dL    Comment: Glucose reference range applies only to samples taken after fasting for at least 8 hours.  Basic metabolic panel     Status: Abnormal   Collection Time: 08/19/22  4:38 PM  Result Value Ref Range   Sodium 133 (L) 135 - 145 mmol/L   Potassium 4.3 3.5 - 5.1 mmol/L   Chloride 103 98 - 111 mmol/L    CO2 17 (L) 22 - 32 mmol/L   Glucose, Bld 153 (H) 70 - 99 mg/dL    Comment: Glucose reference range applies only to samples taken after fasting for at least 8 hours.   BUN 18 6 - 20 mg/dL   Creatinine, Ser 2.35 (H) 0.44 - 1.00 mg/dL   Calcium 8.7 (L) 8.9 - 10.3 mg/dL   GFR, Estimated 42 (L) >60 mL/min    Comment: (NOTE) Calculated using the CKD-EPI Creatinine Equation (2021)    Anion gap 13 5 - 15    Comment: Performed at [facility], [address], [place], [place] [address]  Glucose, capillary     Status: Abnormal   Collection Time: 08/19/22  6:48 PM  Result Value Ref Range   Glucose-Capillary 150 (H) 70 - 99 mg/dL    Comment: Glucose reference range applies only to samples taken after fasting for at least 8 hours.  Glucose, capillary     Status: Abnormal   Collection Time: 08/19/22 10:30 PM  Result Value Ref Range   Glucose-Capillary 192 (H) 70 - 99 mg/dL    Comment: Glucose reference range applies only to samples taken after fasting for at least 8 hours.  Comprehensive metabolic panel     Status: Abnormal   Collection Time: 08/20/22  4:40 AM  Result Value Ref Range   Sodium 134 (L) 135 - 145 mmol/L   Potassium 3.9 3.5 - 5.1 mmol/L   Chloride 104 98 - 111 mmol/L   CO2 20 (L) 22 - 32 mmol/L   Glucose, Bld 124 (H) 70 - 99 mg/dL    Comment: Glucose reference range applies only to samples taken after fasting for at least 8 hours.   BUN 20 6 - 20 mg/dL   Creatinine, Ser 0.25 (H) 0.44 - 1.00 mg/dL   Calcium 8.9 8.9 - 42.7 mg/dL   Total Protein 5.6 (L) 6.5 - 8.1 g/dL   Albumin 2.3 (L) 3.5 -  5.0 g/dL   AST 15 15 - 41 U/L   ALT 13 0 - 44 U/L   Alkaline Phosphatase 45 38 - 126 U/L   Total Bilirubin 0.4 0.3 - 1.2 mg/dL   GFR, Estimated 57 (L) >60 mL/min    Comment: (NOTE) Calculated using the CKD-EPI Creatinine Equation (2021)    Anion gap 10 5 - 15    Comment: Performed at [facility], [address] [place], [place] [address]  CBC     Status: Abnormal    Collection Time: 08/20/22  4:40 AM  Result Value Ref Range   WBC 14.7 (H) 4.0 - 10.5 K/uL   RBC 2.90 (L) 3.87 - 5.11 MIL/uL   Hemoglobin 9.1 (L) 12.0 - 15.0 g/dL   HCT 60.4 (L) 54.0 - 98.1 %   MCV 91.7 80.0 - 100.0 fL   MCH 31.4 26.0 - 34.0 pg   MCHC 34.2 30.0 - 36.0 g/dL   RDW 19.1 47.8 - 29.5 %   Platelets 320 150 - 400 K/uL   nRBC 0.0 0.0 - 0.2 %    Comment: Performed at [facility], [address] [place], [place] [address]    I have reviewed the patient's current medications.  ASSESSMENT: Principal Problem:   Hypertensive crisis Active Problems:   FSGS (focal segmental glomerulosclerosis)   Gestational diabetes mellitus (GDM), antepartum   Chronic hypertension during pregnancy, antepartum   Kidney disease in pregnancy, unspecified trimester   Morbid obesity (HCC)   Hypertension affecting pregnancy   PLAN: G1P0 at 30.3 weeks CHTN versus superimposed preeclampsia, CKD/FSGS and GDM A2  CHTN:  no s/sx of obvious preeclampsia Pt now s/p magnesium sulfate Pt is taking procardia XL 30 mg daily along with labetalol 800mg  TID and hydral 25 mg q 6 hours Creatinine decreased to 1.24  2. GDM  Blood sugars remain moderately elevated, will increase semglee to 8 units BID  3.  Inpatient care Continue BID NST, possibly discuss continuing inpatient care with MFM early this week or remain inpatient until 34-37 weeks for delivery   Continue routine antenatal care.   Mariel Aloe, MD [facility] Faculty Attending, Center for [facility] 08/20/2022 7:30 AM

## 2022-08-20 NOTE — Inpatient Diabetes Management (Signed)
Inpatient Diabetes Program Recommendations  ADA Standards of Care 2024 Diabetes in Pregnancy Target Glucose Ranges:  Fasting: 70 - 95 mg/dL 1 hr postprandial:  027 - 140mg /dL (from first bite of meal) 2 hr postprandial:  100 - 120 mg/dL (from first bit of meal)    Lab Results  Component Value Date   GLUCAP 142 (H) 08/20/2022   HGBA1C 5.5 08/18/2022    Review of Glycemic Control  Diabetes history: GDM Outpatient Diabetes medications: Metformin 1000 mg QAM and 500 mg QPM Current orders for Inpatient glycemic control: Novolog 0-16 units Q4H PRN Semglee 8 units bid Betamethasone 12.5 mg first does on 8/8 @ 2243; 2nd dose 8/09 @ 2224  30/2 weeks HTN  Inpatient Diabetes Program Recommendations:    -   Add Novolog 4 units tid meal coverage  Will continue to follow while inpatient.  Thanks, Christena Deem RN, MSN, BC-ADM Inpatient Diabetes Coordinator Team Pager [phone] (8a-5p)

## 2022-08-21 LAB — GLUCOSE, CAPILLARY
Glucose-Capillary: 80 mg/dL (ref 70–99)
Glucose-Capillary: 128 mg/dL — ABNORMAL HIGH (ref 70–99)
Glucose-Capillary: 101 mg/dL — ABNORMAL HIGH (ref 70–99)
Glucose-Capillary: 147 mg/dL — ABNORMAL HIGH (ref 70–99)

## 2022-08-21 MED ORDER — NIFEDIPINE ER OSMOTIC RELEASE 30 MG PO TB24
30.0000 mg | ORAL_TABLET | Freq: Once | ORAL | Status: AC
  Administered 2022-08-21: 30 mg via ORAL
  Filled 2022-08-21: qty 1

## 2022-08-21 MED ORDER — SODIUM CHLORIDE 0.9% FLUSH
3.0000 mL | Freq: Two times a day (BID) | INTRAVENOUS | Status: DC
  Administered 2022-08-22 – 2022-08-30 (×16): 3 mL via INTRAVENOUS

## 2022-08-21 MED ORDER — NIFEDIPINE ER OSMOTIC RELEASE 60 MG PO TB24
60.0000 mg | ORAL_TABLET | Freq: Every day | ORAL | Status: DC
  Administered 2022-08-22 – 2022-08-30 (×9): 60 mg via ORAL
  Filled 2022-08-21 (×9): qty 1

## 2022-08-21 NOTE — Progress Notes (Signed)
FACULTY PRACTICE ANTEPARTUM COMPREHENSIVE PROGRESS NOTE  Selena Meyer is a 37 y.o. G1P0 at [redacted]w[redacted]d who is admitted for cHTN, CKD/FSGS, GDMA2.  Estimated Date of Delivery: 10/25/22 Fetal presentation is unsure.  Length of Stay:  4 Days. Admitted 08/17/2022  Subjective: Feeling well this morning w/ no complaints. Had a HA yesterday that has resolved. No visual changes, RUQ/epigastric pain. Denies ctx, VB, LOF. +FM  Vitals:  Blood pressure (!) 148/81, pulse 96, temperature 98.3 F (36.8 C), temperature source Oral, resp. rate 14, height 5\' 5"  (1.651 m), weight 87.5 kg, last menstrual period 01/18/2022, SpO2 95%. Physical Examination: CONSTITUTIONAL: Well-developed, well-nourished female in no acute distress.  NEUROLOGIC: Alert and oriented to person, place, and time. CARDIOVASCULAR: Normal heart rate noted RESPIRATORY: Effort normal, no problems with respiration noted ABDOMEN: Soft, nontender, nondistended, gravid.  Fetal monitoring: FHR: 150 bpm, Variability: moderate, Accelerations: 10x10, Decelerations: Absent  Uterine activity: 0 contractions per hour  Results for orders placed or performed during the hospital encounter of 08/17/22 (from the past 48 hour(s))  Glucose, capillary     Status: Abnormal   Collection Time: 08/19/22  2:39 PM  Result Value Ref Range   Glucose-Capillary 170 (H) 70 - 99 mg/dL    Comment: Glucose reference range applies only to samples taken after fasting for at least 8 hours.  Basic metabolic panel     Status: Abnormal   Collection Time: 08/19/22  4:38 PM  Result Value Ref Range   Sodium 133 (L) 135 - 145 mmol/L   Potassium 4.3 3.5 - 5.1 mmol/L   Chloride 103 98 - 111 mmol/L   CO2 17 (L) 22 - 32 mmol/L   Glucose, Bld 153 (H) 70 - 99 mg/dL    Comment: Glucose reference range applies only to samples taken after fasting for at least 8 hours.   BUN 18 6 - 20 mg/dL   Creatinine, Ser 9.56 (H) 0.44 - 1.00 mg/dL   Calcium 8.7 (L) 8.9 - 10.3 mg/dL   GFR, Estimated  42 (L) >60 mL/min    Comment: (NOTE) Calculated using the CKD-EPI Creatinine Equation (2021)    Anion gap 13 5 - 15    Comment: Performed at [facility], [address] [place], [place] [address]  Glucose, capillary     Status: Abnormal   Collection Time: 08/19/22  6:48 PM  Result Value Ref Range   Glucose-Capillary 150 (H) 70 - 99 mg/dL    Comment: Glucose reference range applies only to samples taken after fasting for at least 8 hours.  Glucose, capillary     Status: Abnormal   Collection Time: 08/19/22 10:30 PM  Result Value Ref Range   Glucose-Capillary 192 (H) 70 - 99 mg/dL    Comment: Glucose reference range applies only to samples taken after fasting for at least 8 hours.  Comprehensive metabolic panel     Status: Abnormal   Collection Time: 08/20/22  4:40 AM  Result Value Ref Range   Sodium 134 (L) 135 - 145 mmol/L   Potassium 3.9 3.5 - 5.1 mmol/L   Chloride 104 98 - 111 mmol/L   CO2 20 (L) 22 - 32 mmol/L   Glucose, Bld 124 (H) 70 - 99 mg/dL    Comment: Glucose reference range applies only to samples taken after fasting for at least 8 hours.   BUN 20 6 - 20 mg/dL   Creatinine, Ser 6.57 (H) 0.44 - 1.00 mg/dL   Calcium 8.9 8.9 - 84.6 mg/dL  Total Protein 5.6 (L) 6.5 - 8.1 g/dL   Albumin 2.3 (L) 3.5 - 5.0 g/dL   AST 15 15 - 41 U/L   ALT 13 0 - 44 U/L   Alkaline Phosphatase 45 38 - 126 U/L   Total Bilirubin 0.4 0.3 - 1.2 mg/dL   GFR, Estimated 57 (L) >60 mL/min    Comment: (NOTE) Calculated using the CKD-EPI Creatinine Equation (2021)    Anion gap 10 5 - 15    Comment: Performed at [facility], [address] [place], [place] [address]  CBC     Status: Abnormal   Collection Time: 08/20/22  4:40 AM  Result Value Ref Range   WBC 14.7 (H) 4.0 - 10.5 K/uL   RBC 2.90 (L) 3.87 - 5.11 MIL/uL   Hemoglobin 9.1 (L) 12.0 - 15.0 g/dL   HCT 60.4 (L) 54.0 - 98.1 %   MCV 91.7 80.0 - 100.0 fL   MCH 31.4 26.0 - 34.0 pg   MCHC 34.2 30.0 - 36.0 g/dL   RDW 19.1 47.8  - 29.5 %   Platelets 320 150 - 400 K/uL   nRBC 0.0 0.0 - 0.2 %    Comment: Performed at [facility], [address], [place], [place] [address]  Glucose, capillary     Status: Abnormal   Collection Time: 08/20/22  7:41 AM  Result Value Ref Range   Glucose-Capillary 109 (H) 70 - 99 mg/dL    Comment: Glucose reference range applies only to samples taken after fasting for at least 8 hours.  Glucose, capillary     Status: Abnormal   Collection Time: 08/20/22 10:49 AM  Result Value Ref Range   Glucose-Capillary 142 (H) 70 - 99 mg/dL    Comment: Glucose reference range applies only to samples taken after fasting for at least 8 hours.  Glucose, capillary     Status: Abnormal   Collection Time: 08/20/22  2:56 PM  Result Value Ref Range   Glucose-Capillary 141 (H) 70 - 99 mg/dL    Comment: Glucose reference range applies only to samples taken after fasting for at least 8 hours.  Glucose, capillary     Status: Abnormal   Collection Time: 08/20/22  8:34 PM  Result Value Ref Range   Glucose-Capillary 165 (H) 70 - 99 mg/dL    Comment: Glucose reference range applies only to samples taken after fasting for at least 8 hours.  Glucose, capillary     Status: Abnormal   Collection Time: 08/20/22 10:42 PM  Result Value Ref Range   Glucose-Capillary 109 (H) 70 - 99 mg/dL    Comment: Glucose reference range applies only to samples taken after fasting for at least 8 hours.  Comprehensive metabolic panel     Status: Abnormal   Collection Time: 08/21/22  4:37 AM  Result Value Ref Range   Sodium 136 135 - 145 mmol/L   Potassium 4.0 3.5 - 5.1 mmol/L   Chloride 104 98 - 111 mmol/L   CO2 21 (L) 22 - 32 mmol/L   Glucose, Bld 96 70 - 99 mg/dL    Comment: Glucose reference range applies only to samples taken after fasting for at least 8 hours.   BUN 13 6 - 20 mg/dL   Creatinine, Ser 8.65 (H) 0.44 - 1.00 mg/dL   Calcium 9.1 8.9 - 78.4 mg/dL   Total Protein 5.8 (L) 6.5 - 8.1 g/dL   Albumin 2.4 (L)  3.5 - 5.0 g/dL  AST 16 15 - 41 U/L   ALT 13 0 - 44 U/L   Alkaline Phosphatase 44 38 - 126 U/L   Total Bilirubin 0.3 0.3 - 1.2 mg/dL   GFR, Estimated >16 >10 mL/min    Comment: (NOTE) Calculated using the CKD-EPI Creatinine Equation (2021)    Anion gap 11 5 - 15    Comment: Performed at [facility], [address] [place], [place] [address]  CBC     Status: Abnormal   Collection Time: 08/21/22  4:37 AM  Result Value Ref Range   WBC 12.8 (H) 4.0 - 10.5 K/uL   RBC 3.14 (L) 3.87 - 5.11 MIL/uL   Hemoglobin 9.4 (L) 12.0 - 15.0 g/dL   HCT 40.9 (L) 81.1 - 91.4 %   MCV 90.4 80.0 - 100.0 fL   MCH 29.9 26.0 - 34.0 pg   MCHC 33.1 30.0 - 36.0 g/dL   RDW 78.2 95.6 - 21.3 %   Platelets 315 150 - 400 K/uL   nRBC 0.0 0.0 - 0.2 %    Comment: Performed at [facility], [address] [place], [place] [address]  Glucose, capillary     Status: None   Collection Time: 08/21/22  8:46 AM  Result Value Ref Range   Glucose-Capillary 80 70 - 99 mg/dL    Comment: Glucose reference range applies only to samples taken after fasting for at least 8 hours.  Glucose, capillary     Status: Abnormal   Collection Time: 08/21/22 11:55 AM  Result Value Ref Range   Glucose-Capillary 128 (H) 70 - 99 mg/dL    Comment: Glucose reference range applies only to samples taken after fasting for at least 8 hours.    No results found.  Current scheduled medications  hydrALAZINE  25 mg Oral Q6H   insulin aspart  0-16 Units Subcutaneous TID PC   insulin aspart  4 Units Subcutaneous TID WC   insulin glargine-yfgn  8 Units Subcutaneous BID   labetalol  800 mg Oral Q8H   [START ON 08/22/2022] NIFEdipine  60 mg Oral Daily   prenatal multivitamin  1 tablet Oral Q1200    I have reviewed the patient's current medications.  ASSESSMENT: Principal Problem:   Hypertensive crisis Active Problems:   FSGS (focal segmental glomerulosclerosis)   Gestational diabetes mellitus (GDM), antepartum   Chronic  hypertension during pregnancy, antepartum   Kidney disease in pregnancy, unspecified trimester   Morbid obesity (HCC)   Hypertension affecting pregnancy   PLAN: 37 yo G1P0 at [redacted]w[redacted]d with worsening cHTN vs SIPE w/ SF (BP), CKD/FSGS, GDMA2   Worsening cHTN vs SIPE, CKD - asymptomatic, s/p mag 4/2 x 24h - mild range BP today, will increase to procardia XL 60mg  daily; continue labetalol 800mg  TID and hydral 25mg  q6h. Procardia 60mg  is the equivalent dose to amlodipine 10mg  that she was previously on.  - Cr improving, now 1.14 from 1.59 on 8/10 - suspect increase in Cr was related to rapid drop in BP & addition of hydral, not SIPE - if BP is controlled with new regimen and labs remain stable/improving, plan for discharge home tomorrow with weekly labs and 2x/wk antenatal testing - MFM previously consulted - will reengage tomorrow prior to discharge   2. GDMA2 - metformin dc'd w/ Cr 1.43 - semglee 8u BID w/ NVG 4u TID meals (anticipate bump in BGs w/ recent BMZ) - DM coordinator following - pt is considering her options. She would prefer to go  back to metformin on discharge, but we discussed concerns related to BG control. Will continue to work with DM educator   3. FWB - S/p mag as above & BMZ 8/8-9 - NST BID - Growth 7/25 @28 /1 1132g (26%), AC 51% - Known fetal ventriculomegaly, planned for outpatient fetal MRI 8/27 - Will consult NICU if moving towards delivery   Continue routine antenatal care.   Harvie Bridge, MD Obstetrician & Gynecologist, [facility] for [employer], [facility]

## 2022-08-22 LAB — COMPREHENSIVE METABOLIC PANEL WITH GFR
ALT: 16 U/L (ref 0–44)
AST: 15 U/L (ref 15–41)
Albumin: 2.4 g/dL — ABNORMAL LOW (ref 3.5–5.0)
Alkaline Phosphatase: 47 U/L (ref 38–126)
Anion gap: 7 (ref 5–15)
BUN: 12 mg/dL (ref 6–20)
CO2: 22 mmol/L (ref 22–32)
Calcium: 9 mg/dL (ref 8.9–10.3)
Chloride: 104 mmol/L (ref 98–111)
Creatinine, Ser: 0.98 mg/dL (ref 0.44–1.00)
GFR, Estimated: >60 mL/min
Glucose, Bld: 94 mg/dL (ref 70–99)
Potassium: 3.6 mmol/L (ref 3.5–5.1)
Sodium: 133 mmol/L — ABNORMAL LOW (ref 135–145)
Total Bilirubin: 0.1 mg/dL — ABNORMAL LOW (ref 0.3–1.2)
Total Protein: 5.7 g/dL — ABNORMAL LOW (ref 6.5–8.1)

## 2022-08-22 LAB — CBC
HCT: 28.3 % — ABNORMAL LOW (ref 36.0–46.0)
Hemoglobin: 9.5 g/dL — ABNORMAL LOW (ref 12.0–15.0)
MCH: 29.9 pg (ref 26.0–34.0)
MCHC: 33.6 g/dL (ref 30.0–36.0)
MCV: 89 fL (ref 80.0–100.0)
Platelets: 319 10*3/uL (ref 150–400)
RBC: 3.18 MIL/uL — ABNORMAL LOW (ref 3.87–5.11)
RDW: 13 % (ref 11.5–15.5)
WBC: 12.6 10*3/uL — ABNORMAL HIGH (ref 4.0–10.5)
nRBC: 0 % (ref 0.0–0.2)

## 2022-08-22 LAB — GLUCOSE, CAPILLARY
Glucose-Capillary: 106 mg/dL — ABNORMAL HIGH (ref 70–99)
Glucose-Capillary: 122 mg/dL — ABNORMAL HIGH (ref 70–99)
Glucose-Capillary: 163 mg/dL — ABNORMAL HIGH (ref 70–99)

## 2022-08-22 MED ORDER — NIFEDIPINE ER OSMOTIC RELEASE 30 MG PO TB24
30.0000 mg | ORAL_TABLET | Freq: Every day | ORAL | Status: DC
  Administered 2022-08-22 – 2022-08-29 (×8): 30 mg via ORAL
  Filled 2022-08-22 (×8): qty 1

## 2022-08-22 MED ORDER — INSULIN ASPART 100 UNIT/ML IJ SOLN
5.0000 [IU] | Freq: Three times a day (TID) | INTRAMUSCULAR | Status: DC
  Administered 2022-08-22 – 2022-08-23 (×6): 5 [IU] via SUBCUTANEOUS

## 2022-08-22 MED ORDER — INSULIN ASPART 100 UNIT/ML IJ SOLN: 5.0000 [IU] | Freq: Three times a day (TID) | INTRAMUSCULAR | Status: DC

## 2022-08-22 NOTE — Progress Notes (Signed)
FACULTY PRACTICE ANTEPARTUM COMPREHENSIVE PROGRESS NOTE  TAMME HOMEWOOD is a 37 y.o. G1P0 at [redacted]w[redacted]d who is admitted for cHTN, CKD/FSGS, GDMA2.  Estimated Date of Delivery: 10/25/22 Fetal presentation is unsure.  Length of Stay:  5 Days. Admitted 08/17/2022  Subjective: Delayed entry due to patient care, patient evaluated around 9:30am. Had a HA from around 4am-8am that resolved spontaneously. No visual changes, RUQ/epigastric pain. Denies ctx, VB, LOF. +FM. Otherwise doing ok.   Today [birth year] her birthday  Vitals:  Blood pressure (!) 146/81, pulse 100, temperature 97.8 F (36.6 C), temperature source Oral, resp. rate 18, height 5\' 5"  (1.651 m), weight 87.5 kg, last menstrual period 01/18/2022, SpO2 98%. Physical Examination: CONSTITUTIONAL: Well-developed, well-nourished female in no acute distress.  NEUROLOGIC: Alert and oriented to person, place, and time. CARDIOVASCULAR: Normal heart rate noted RESPIRATORY: Effort normal, no problems with respiration noted ABDOMEN: Soft, nontender, nondistended, gravid.  Fetal monitoring: FHR: 150 bpm, Variability: moderate, Accelerations: 10x10, Decelerations: Absent  Uterine activity: 0 contractions per hour  Results for orders placed or performed during the hospital encounter of 08/17/22 (from the past 48 hour(s))  Glucose, capillary     Status: Abnormal   Collection Time: 08/20/22  8:34 PM  Result Value Ref Range   Glucose-Capillary 165 (H) 70 - 99 mg/dL    Comment: Glucose reference range applies only to samples taken after fasting for at least 8 hours.  Glucose, capillary     Status: Abnormal   Collection Time: 08/20/22 10:42 PM  Result Value Ref Range   Glucose-Capillary 109 (H) 70 - 99 mg/dL    Comment: Glucose reference range applies only to samples taken after fasting for at least 8 hours.  Comprehensive metabolic panel     Status: Abnormal   Collection Time: 08/21/22  4:37 AM  Result Value Ref Range   Sodium 136 135 - 145 mmol/L    Potassium 4.0 3.5 - 5.1 mmol/L   Chloride 104 98 - 111 mmol/L   CO2 21 (L) 22 - 32 mmol/L   Glucose, Bld 96 70 - 99 mg/dL    Comment: Glucose reference range applies only to samples taken after fasting for at least 8 hours.   BUN 13 6 - 20 mg/dL   Creatinine, Ser 4.09 (H) 0.44 - 1.00 mg/dL   Calcium 9.1 8.9 - 81.1 mg/dL   Total Protein 5.8 (L) 6.5 - 8.1 g/dL   Albumin 2.4 (L) 3.5 - 5.0 g/dL   AST 16 15 - 41 U/L   ALT 13 0 - 44 U/L   Alkaline Phosphatase 44 38 - 126 U/L   Total Bilirubin 0.3 0.3 - 1.2 mg/dL   GFR, Estimated >91 >47 mL/min    Comment: (NOTE) Calculated using the CKD-EPI Creatinine Equation (2021)    Anion gap 11 5 - 15    Comment: Performed at [facility], [address] [place], [place] [address]  CBC     Status: Abnormal   Collection Time: 08/21/22  4:37 AM  Result Value Ref Range   WBC 12.8 (H) 4.0 - 10.5 K/uL   RBC 3.14 (L) 3.87 - 5.11 MIL/uL   Hemoglobin 9.4 (L) 12.0 - 15.0 g/dL   HCT 21.3 (L) 08.6 - 57.8 %   MCV 90.4 80.0 - 100.0 fL   MCH 29.9 26.0 - 34.0 pg   MCHC 33.1 30.0 - 36.0 g/dL   RDW 46.9 62.9 - 52.8 %   Platelets 315 150 - 400 K/uL   nRBC  0.0 0.0 - 0.2 %    Comment: Performed at [facility], [address], [place], [place] [address]  Glucose, capillary     Status: None   Collection Time: 08/21/22  8:46 AM  Result Value Ref Range   Glucose-Capillary 80 70 - 99 mg/dL    Comment: Glucose reference range applies only to samples taken after fasting for at least 8 hours.  Glucose, capillary     Status: Abnormal   Collection Time: 08/21/22 11:55 AM  Result Value Ref Range   Glucose-Capillary 128 (H) 70 - 99 mg/dL    Comment: Glucose reference range applies only to samples taken after fasting for at least 8 hours.  Glucose, capillary     Status: Abnormal   Collection Time: 08/21/22  3:29 PM  Result Value Ref Range   Glucose-Capillary 101 (H) 70 - 99 mg/dL    Comment: Glucose reference range applies only to samples taken after  fasting for at least 8 hours.  Glucose, capillary     Status: Abnormal   Collection Time: 08/21/22  9:57 PM  Result Value Ref Range   Glucose-Capillary 147 (H) 70 - 99 mg/dL    Comment: Glucose reference range applies only to samples taken after fasting for at least 8 hours.  Comprehensive metabolic panel     Status: Abnormal   Collection Time: 08/22/22  6:28 AM  Result Value Ref Range   Sodium 133 (L) 135 - 145 mmol/L   Potassium 3.6 3.5 - 5.1 mmol/L   Chloride 104 98 - 111 mmol/L   CO2 22 22 - 32 mmol/L   Glucose, Bld 94 70 - 99 mg/dL    Comment: Glucose reference range applies only to samples taken after fasting for at least 8 hours.   BUN 12 6 - 20 mg/dL   Creatinine, Ser 6.21 0.44 - 1.00 mg/dL   Calcium 9.0 8.9 - 30.8 mg/dL   Total Protein 5.7 (L) 6.5 - 8.1 g/dL   Albumin 2.4 (L) 3.5 - 5.0 g/dL   AST 15 15 - 41 U/L   ALT 16 0 - 44 U/L   Alkaline Phosphatase 47 38 - 126 U/L   Total Bilirubin 0.1 (L) 0.3 - 1.2 mg/dL   GFR, Estimated >65 >78 mL/min    Comment: (NOTE) Calculated using the CKD-EPI Creatinine Equation (2021)    Anion gap 7 5 - 15    Comment: Performed at [facility], [address], [place], [place] [address]  CBC     Status: Abnormal   Collection Time: 08/22/22  6:28 AM  Result Value Ref Range   WBC 12.6 (H) 4.0 - 10.5 K/uL   RBC 3.18 (L) 3.87 - 5.11 MIL/uL   Hemoglobin 9.5 (L) 12.0 - 15.0 g/dL   HCT 95.2 (L) 84.1 - 32.4 %   MCV 89.0 80.0 - 100.0 fL   MCH 29.9 26.0 - 34.0 pg   MCHC 33.6 30.0 - 36.0 g/dL   RDW 40.1 02.7 - 25.3 %   Platelets 319 150 - 400 K/uL   nRBC 0.0 0.0 - 0.2 %    Comment: Performed at [facility], [address] [place], [place] [address]  Glucose, capillary     Status: Abnormal   Collection Time: 08/22/22  7:59 AM  Result Value Ref Range   Glucose-Capillary 106 (H) 70 - 99 mg/dL    Comment: Glucose reference range applies only to samples taken after fasting for at least 8  hours.  Glucose, capillary     Status:  Abnormal   Collection Time: 08/22/22 11:36 AM  Result Value Ref Range   Glucose-Capillary 122 (H) 70 - 99 mg/dL    Comment: Glucose reference range applies only to samples taken after fasting for at least 8 hours.  Glucose, capillary     Status: Abnormal   Collection Time: 08/22/22  4:03 PM  Result Value Ref Range   Glucose-Capillary 163 (H) 70 - 99 mg/dL    Comment: Glucose reference range applies only to samples taken after fasting for at least 8 hours.    No results found.  Current scheduled medications  hydrALAZINE  25 mg Oral Q6H   insulin aspart  0-16 Units Subcutaneous TID PC   insulin aspart  5 Units Subcutaneous TID WC   insulin glargine-yfgn  8 Units Subcutaneous BID   labetalol  800 mg Oral Q8H   NIFEdipine  60 mg Oral Daily   NIFEdipine  30 mg Oral QHS   prenatal multivitamin  1 tablet Oral Q1200   sodium chloride flush  3 mL Intravenous Q12H    I have reviewed the patient's current medications.  ASSESSMENT: Principal Problem:   Hypertensive crisis Active Problems:   FSGS (focal segmental glomerulosclerosis)   Gestational diabetes mellitus (GDM), antepartum   Chronic hypertension during pregnancy, antepartum   Kidney disease in pregnancy, unspecified trimester   Morbid obesity (HCC)   Hypertension affecting pregnancy   PLAN: 37 yo G1P0 at [redacted]w[redacted]d with worsening cHTN vs SIPE w/ SF (BP), CKD/FSGS, GDMA2   Worsening cHTN vs SIPE, CKD - asymptomatic, s/p mag 4/2 x 24h - continues to have mild range blood pressures despite titration of procardia. Will increase to procardia XL 60/30, continue labetalol 800mg  TID and hydral 25mg  q6h - Cr has normalized, now 0.98 from 1.59 on 8/10 - suspect increase in Cr was related to rapid drop in BP & addition of hydral, not SIPE - given need for continued titration of BP meds, suspect her presentation is more c/w SIPE. Will continue inpatient management.    2. GDMA2 - metformin dc'd w/ Cr 1.43 - semglee 8u BID w/ NVG 5u TID  meals - DM coordinator following   3. FWB - S/p mag as above & BMZ 8/8-9 - NST BID - Growth 7/25 @ 28/1 1132g (26%), AC 51%, cephalic - Known fetal ventriculomegaly, planned for outpatient fetal MRI 8/27. Will need to address options for work up with MFM - Will consult NICU when delivery timing is more clear   Continue routine antenatal care.   Harvie Bridge, MD Obstetrician & Gynecologist, [facility] [employer], [facility]

## 2022-08-22 NOTE — Inpatient Diabetes Management (Signed)
Inpatient Diabetes Program Recommendations  AACE/ADA: New Consensus Statement on Inpatient Glycemic Control (2015)  Target Ranges:  Prepandial:   less than 140 mg/dL      Peak postprandial:   less than 180 mg/dL (1-2 hours)      Critically ill patients:  140 - 180 mg/dL   Lab Results  Component Value Date   GLUCAP 106 (H) 08/22/2022   HGBA1C 5.5 08/18/2022    Review of Glycemic Control  Latest Reference Range & Units 08/21/22 08:46 08/21/22 11:55 08/21/22 15:29 08/21/22 21:57 08/22/22 07:59  Glucose-Capillary 70 - 99 mg/dL 80 865 (H) 784 (H) 696 (H) 106 (H)  (H): Data is abnormally high  Diabetes history: GDM Outpatient Diabetes medications: Metformin 1000 mg QAM and 500 mg QPM Current orders for Inpatient glycemic control: Novolog 4 units TID Novolog 0-16 units Q4H PRN Semglee 8 units bid Betamethasone 12.5 mg first does on 8/8 @ 2243; 2nd dose 8/09 @ 2224  Inpatient Diabetes Program Recommendations:    Please consider:  Novolog 5 units TID with meals.  Will continue to follow while inpatient.  Thank you, Dulce Sellar, MSN, CDCES Diabetes Coordinator Inpatient Diabetes Program [phone] (team pager from 8a-5p)

## 2022-08-23 LAB — GLUCOSE, CAPILLARY
Glucose-Capillary: 99 mg/dL (ref 70–99)
Glucose-Capillary: 144 mg/dL — ABNORMAL HIGH (ref 70–99)
Glucose-Capillary: 201 mg/dL — ABNORMAL HIGH (ref 70–99)
Glucose-Capillary: 129 mg/dL — ABNORMAL HIGH (ref 70–99)

## 2022-08-23 MED ORDER — INSULIN ASPART 100 UNIT/ML IJ SOLN
7.0000 [IU] | Freq: Three times a day (TID) | INTRAMUSCULAR | Status: DC
  Administered 2022-08-24 – 2022-08-26 (×6): 7 [IU] via SUBCUTANEOUS

## 2022-08-23 MED ORDER — INSULIN GLARGINE-YFGN 100 UNIT/ML ~~LOC~~ SOLN
9.0000 [IU] | Freq: Two times a day (BID) | SUBCUTANEOUS | Status: DC
  Administered 2022-08-23 – 2022-08-26 (×6): 9 [IU] via SUBCUTANEOUS
  Filled 2022-08-23 (×7): qty 0.09

## 2022-08-23 NOTE — Progress Notes (Signed)
Patient ID: Selena Meyer, female   DOB: 1985-03-17, 37 y.o.   MRN: [id number] FACULTY PRACTICE ANTEPARTUM(COMPREHENSIVE) NOTE  Selena Meyer is a 37 y.o. G1P0 at [redacted]w[redacted]d by LMP who is admitted for [facility] and preeclampsia.   Fetal presentation is cephalic. Length of Stay:  6  Days  Subjective: Headache now resolved, had some nausea Patient reports the fetal movement as active. Patient reports uterine contraction  activity as none. Patient reports  vaginal bleeding as none. Patient describes fluid per vagina as None.  Vitals:  Blood pressure 122/62, pulse 91, temperature 98 F (36.7 C), temperature source Oral, resp. rate 18, height 5\' 5"  (1.651 m), weight 87.5 kg, last menstrual period 01/18/2022, SpO2 99%. Physical Examination:  General appearance - alert, well appearing, and in no distress Heart - normal rate and regular rhythm Abdomen - soft, nontender, nondistended Fundal Height:  size equals dates Extremities: extremities normal, atraumatic, no cyanosis or edema and Homans sign is negative, no sign of DVT with DTRs 2+ bilaterally Membranes:intact  Fetal Monitoring:   Fetal Heart Rate A   Mode External filed at 08/22/2022 2207  Baseline Rate (A) 145 bpm filed at 08/22/2022 2207  Variability 6-25 BPM filed at 08/22/2022 2207  Accelerations 15 x 15 filed at 08/22/2022 2207  Decelerations None filed at 08/22/2022 2207    Labs:  Results for orders placed or performed during the hospital encounter of 08/17/22 (from the past 24 hour(s))  Glucose, capillary   Collection Time: 08/22/22 11:36 AM  Result Value Ref Range   Glucose-Capillary 122 (H) 70 - 99 mg/dL  Glucose, capillary   Collection Time: 08/22/22  4:03 PM  Result Value Ref Range   Glucose-Capillary 163 (H) 70 - 99 mg/dL  Glucose, capillary   Collection Time: 08/22/22 10:12 PM  Result Value Ref Range   Glucose-Capillary 115 (H) 70 - 99 mg/dL  Glucose, capillary   Collection Time: 08/23/22  8:13 AM  Result Value Ref  Range   Glucose-Capillary 99 70 - 99 mg/dL     Medications:  Scheduled  hydrALAZINE  25 mg Oral Q6H   insulin aspart  0-16 Units Subcutaneous TID PC   insulin aspart  5 Units Subcutaneous TID WC   insulin glargine-yfgn  8 Units Subcutaneous BID   labetalol  800 mg Oral Q8H   NIFEdipine  60 mg Oral Daily   NIFEdipine  30 mg Oral QHS   prenatal multivitamin  1 tablet Oral Q1200   sodium chloride flush  3 mL Intravenous Q12H   I have reviewed the patient's current medications.  ASSESSMENT: Patient Active Problem List   Diagnosis Date Noted   Hypertension affecting pregnancy 08/19/2022   Hypertensive crisis 08/17/2022   Known fetal anomaly, antepartum 06/15/2022   Morbid obesity (HCC) 05/19/2022   Moderate left ventricular hypertrophy 05/19/2022   Kidney disease in pregnancy, unspecified trimester 05/11/2022   Chronic hypertension during pregnancy, antepartum 05/09/2022   AMA (advanced maternal age) primigravida 35+ 05/05/2022   Gestational diabetes mellitus (GDM), antepartum 04/27/2022   Supervision of high risk pregnancy, antepartum 03/30/2022   IDA (iron deficiency anemia) 12/16/2021   SVT (supraventricular tachycardia) 11/01/2020   FSGS (focal segmental glomerulosclerosis) 04/15/2018    PLAN: Discussed with Dr. Grace Bushy. Continue present care, diabetes management with recommendations by DM coordinator   Selena Meyer 08/23/2022,9:22 AM

## 2022-08-23 NOTE — Progress Notes (Signed)
Initial Nutrition Assessment  DOCUMENTATION CODES:  Morbid obesity  INTERVENTION:  Regular Diet Pt may order double protein portions and snacks TID if she makes request when ordering meals   NUTRITION DIAGNOSIS:  Increased nutrient needs related to  (pregnancy and fetal growth requirements) as evidenced by  (31 weeks IUP).  GOAL:  Weight gain, Patient will meet greater than or equal to 90% of their needs  MONITOR:  Labs  REASON FOR ASSESSMENT:  Antenatal, LOS    ASSESSMENT:  Now 31 weeks IUP, adm with C-HTN, A2GDM, PEC, kidney disease. Weight at 10 weeks 85.3 kg, BMI 31.3. Weight gain of 2.2 kg  Experienced nausea and vomiting when at home, but has not had many episodes of this since adm.  S/p BMZ, A2GDM controlled with insulin and metformin  HgbA1C 5.5 on 08/18/22   Pt hungry after meals. Encouraged double protein portions, allowed snacks TID. Pt showed menu and CHO count and advised not to go over 60 g CHO per meal. Eliminate juice consumption and fruit at breakfast to help with glucose control. Family will bring in meals from outside.  NUTRITION - FOCUSED PHYSICAL EXAM: Appears well nourished  Diet Order:   Diet Order             Diet regular Fluid consistency: Thin  Diet effective now                   EDUCATION NEEDS:  Education needs have been addressed (Pt has had extensive outpt follow-up with CDE, on balanced meal plan )  Height:   Ht Readings from Last 1 Encounters:  08/17/22 5\' 5"  (1.651 m)    Weight:   Wt Readings from Last 1 Encounters:  08/17/22 87.5 kg    Ideal Body Weight:   125 lbs  BMI:  Body mass index is 32.1 kg/m.  Estimated Nutritional Needs:   Kcal:  2200-2400  Protein:  98-108 g  Fluid:  > 2.2 kg

## 2022-08-23 NOTE — Inpatient Diabetes Management (Signed)
Inpatient Diabetes Program Recommendations  AACE/ADA: New Consensus Statement on Inpatient Glycemic Control (2015)  Target Ranges:  Prepandial:   less than 140 mg/dL      Peak postprandial:   less than 180 mg/dL (1-2 hours)      Critically ill patients:  140 - 180 mg/dL   Lab Results  Component Value Date   GLUCAP 144 (H) 08/23/2022   HGBA1C 5.5 08/18/2022    Review of Glycemic Control  Latest Reference Range & Units 08/22/22 07:59 08/22/22 11:36 08/22/22 16:03 08/22/22 22:12 08/23/22 08:13 08/23/22 11:46  Glucose-Capillary 70 - 99 mg/dL 161 (H) 096 (H) 045 (H) 115 (H) 99 144 (H)  (H): Data is abnormally high  Diabetes history: GDM Outpatient Diabetes medications: Metformin 1000 mg QAM and 500 mg QPM Current orders for Inpatient glycemic control: Semglee 8 units units BID, Novolog 0-16 units after meals TID and Novolog 5 units TID with meals  Inpatient Diabetes Program Recommendations:    Semglee 9 units BID Novolog 7 units TID with meals if she consumes at least 50%  Will continue to follow while inpatient.  Thank you, Dulce Sellar, MSN, CDCES Diabetes Coordinator Inpatient Diabetes Program [phone] (team pager from 8a-5p)

## 2022-08-23 NOTE — Consult Note (Addendum)
MFM Follow up Consultation   Selena Meyer is a 37 yo G1P0 at 28 w 0d with an EDD of 10/25/22.  She is seen today as a follow up consultation due to CKD, chronic hypretension exasperation and A2GDM.  She is doing well today with a slight headache that has resolved with tylenol.  She is doing better with her blood pressures typically < 150/100 with most blood pressures  140/80's. Her labs are stable with slight fluctuations in her protineuria.  Her A2GDM is improved with a change from metformin to [facility] and a new addition of 4 units of Novolin.   Fetal testing has remained reassuring. S/P BMZ      08/23/2022   11:44 AM 08/23/2022    8:10 AM 08/23/2022    3:53 AM  Vitals with BMI  Systolic 117 122 161  Diastolic 62 62 84  Pulse 97 91 93      Latest Ref Rng & Units 08/22/2022    6:28 AM 08/21/2022    4:37 AM 08/20/2022    4:40 AM  CBC  WBC 4.0 - 10.5 K/uL 12.6  12.8  14.7   Hemoglobin 12.0 - 15.0 g/dL 9.5  9.4  9.1   Hematocrit 36.0 - 46.0 % 28.3  28.4  26.6   Platelets 150 - 400 K/uL 319  315  320       Latest Ref Rng & Units 08/22/2022    6:28 AM 08/21/2022    4:37 AM 08/20/2022    4:40 AM  CMP  Glucose 70 - 99 mg/dL 94  96  096   BUN 6 - 20 mg/dL 12  13  20    Creatinine 0.44 - 1.00 mg/dL 0.45  4.09  8.11   Sodium 135 - 145 mmol/L 133  136  134   Potassium 3.5 - 5.1 mmol/L 3.6  4.0  3.9   Chloride 98 - 111 mmol/L 104  104  104   CO2 22 - 32 mmol/L 22  21  20    Calcium 8.9 - 10.3 mg/dL 9.0  9.1  8.9   Total Protein 6.5 - 8.1 g/dL 5.7  5.8  5.6   Total Bilirubin 0.3 - 1.2 mg/dL 0.1  0.3  0.4   Alkaline Phos 38 - 126 U/L 47  44  45   AST 15 - 41 U/L 15  16  15    ALT 0 - 44 U/L 16  13  13      Current Facility-Administered Medications (Endocrine & Metabolic):    CBG monitoring **AND** insulin aspart (novoLOG) injection 0-16 Units   insulin aspart (novoLOG) injection 5 Units   insulin glargine-yfgn (SEMGLEE) injection 8 Units   Current Facility-Administered Medications  (Cardiovascular):    hydrALAZINE (APRESOLINE) injection 5 mg **AND** hydrALAZINE (APRESOLINE) injection 10 mg **AND** labetalol (NORMODYNE) injection 20 mg **AND** labetalol (NORMODYNE) injection 40 mg **AND** Measure blood pressure   hydrALAZINE (APRESOLINE) tablet 25 mg   labetalol (NORMODYNE) tablet 800 mg   NIFEdipine (PROCARDIA XL/NIFEDICAL XL) 24 hr tablet 60 mg   NIFEdipine (PROCARDIA-XL/NIFEDICAL-XL) 24 hr tablet 30 mg     Current Facility-Administered Medications (Analgesics):    acetaminophen (TYLENOL) tablet 650 mg     Current Facility-Administered Medications (Other):    calcium carbonate (TUMS - dosed in mg elemental calcium) chewable tablet 400 mg of elemental calcium   docusate sodium (COLACE) capsule 100 mg   lactated ringers infusion   ondansetron (ZOFRAN) injection 4 mg   prenatal multivitamin tablet 1 tablet  sodium chloride flush (NS) 0.9 % injection 3 mL  No current outpatient medications on file.   Impression/Counseling:  1) CHTN- Continue current regimen with the goal of an avg BP 135/85 mmHg.   Continue daily ASA  Again we agree with the change of Procardia from Norvasc. She is a 90 mg per day. Max 120 mg daily.   Creatine much improved.    A) Consider delivery if IV therapy is required for management of blood pressure.  B) Consider delivery if oral therapy is maximized but patient continues to post severe range blood pressures.  C) Consider delivery for abnormal labs or persistent headache unresolved with treatment.  Repeat fetal growth in 1 week. Continue twice daily NST and twice weekly amniotic fluid volume. At 32 weeks convert to twice daily NST and twice weekly BPP.  2) A2GDM-  HgbA1c 5.5  Consider blood sugar control appreciate inpatient DM management teams input.  Cesarean delivery for obstetric indications.   3) Fetal ventriculomegaly.  Continue serial evaluation will discuss with NICU if Selena Meyer remains inpatient.  All  questions answered  I spent 35-45 minutes with > 50% in face to face consultation.  Novella Olive, MD  I discussed todays plan of care with Dr. Debroah Loop

## 2022-08-24 LAB — GLUCOSE, CAPILLARY
Glucose-Capillary: 89 mg/dL (ref 70–99)
Glucose-Capillary: 75 mg/dL (ref 70–99)
Glucose-Capillary: 52 mg/dL — ABNORMAL LOW (ref 70–99)
Glucose-Capillary: 74 mg/dL (ref 70–99)
Glucose-Capillary: 115 mg/dL — ABNORMAL HIGH (ref 70–99)

## 2022-08-24 NOTE — Progress Notes (Signed)
Patient ID: [name], female   DOB: November 02, 1985, 37 y.o.   MRN: [id number] FACULTY PRACTICE ANTEPARTUM(COMPREHENSIVE) NOTE   Fetal presentation is cephalic. is a 37 y.o. G1P0 at [redacted]w[redacted]d by LMP who is admitted for [facility] and preeclampsia.  .   Fetal presentation is cephalic. Length of Stay:  7  Days  Subjective: Minimal HA now Patient reports the fetal movement as active. Patient reports uterine contraction  activity as none. Patient reports  vaginal bleeding as none. Patient describes fluid per vagina as None.  Vitals:  Blood pressure 134/76, pulse 90, temperature 98.4 F (36.9 C), temperature source Oral, resp. rate 18, height 5\' 5"  (1.651 m), weight 87.5 kg, last menstrual period 01/18/2022, SpO2 96%. Physical Examination:  General appearance - alert, well appearing, and in no distress Heart - normal rate and regular rhythm Abdomen - soft, nontender, nondistended Fundal Height:  size equals dates. Extremities: extremities normal, atraumatic, no cyanosis or edema and Homans sign is negative, no sign of DVT  Membranes:intact  Fetal Monitoring:   Fetal Heart Rate A   Mode External filed at 08/23/2022 2204  Baseline Rate (A) 140 bpm filed at 08/23/2022 2204  Variability 6-25 BPM filed at 08/23/2022 2204  Accelerations 15 x 15 filed at 08/23/2022 2204  Decelerations None filed at 08/23/2022 2204    Labs:  Results for orders placed or performed during the hospital encounter of 08/17/22 (from the past 24 hour(s))  Glucose, capillary   Collection Time: 08/23/22 11:46 AM  Result Value Ref Range   Glucose-Capillary 144 (H) 70 - 99 mg/dL  Glucose, capillary   Collection Time: 08/23/22  4:08 PM  Result Value Ref Range   Glucose-Capillary 201 (H) 70 - 99 mg/dL  Glucose, capillary   Collection Time: 08/23/22 10:11 PM  Result Value Ref Range   Glucose-Capillary 129 (H) 70 - 99 mg/dL  Glucose, capillary   Collection Time: 08/24/22  6:03 AM  Result Value Ref Range   Glucose-Capillary  89 70 - 99 mg/dL     Medications:  Scheduled  hydrALAZINE  25 mg Oral Q6H   insulin aspart  0-16 Units Subcutaneous TID PC   insulin aspart  7 Units Subcutaneous TID WC   insulin glargine-yfgn  9 Units Subcutaneous BID   labetalol  800 mg Oral Q8H   NIFEdipine  60 mg Oral Daily   NIFEdipine  30 mg Oral QHS   prenatal multivitamin  1 tablet Oral Q1200   sodium chloride flush  3 mL Intravenous Q12H   I have reviewed the patient's current medications.  ASSESSMENT: Patient Active Problem List   Diagnosis Date Noted   Hypertension affecting pregnancy 08/19/2022   Hypertensive crisis 08/17/2022   Known fetal anomaly, antepartum 06/15/2022   Morbid obesity (HCC) 05/19/2022   Moderate left ventricular hypertrophy 05/19/2022   Kidney disease in pregnancy, unspecified trimester 05/11/2022   Chronic hypertension during pregnancy, antepartum 05/09/2022   AMA (advanced maternal age) primigravida 35+ 05/05/2022   Gestational diabetes mellitus (GDM), antepartum 04/27/2022   Supervision of high risk pregnancy, antepartum 03/30/2022   IDA (iron deficiency anemia) 12/16/2021   SVT (supraventricular tachycardia) 11/01/2020   FSGS (focal segmental glomerulosclerosis) 04/15/2018    PLAN:  Continue present care, diabetes management with recommendations by DM coordinator reviewed  Scheryl Darter 08/24/2022,8:54 AM

## 2022-08-24 NOTE — Progress Notes (Signed)
15 minute BS recheck is 74. Pt remains asymptomatic and without complaints. Carmelina Dane, RN

## 2022-08-24 NOTE — Progress Notes (Signed)
BS 52. Pt asymptomatic at this time. 4oz apple juice, peanut butter and graham crackers given. Will recheck in 15 minutes. Carmelina Dane, RN

## 2022-08-25 ENCOUNTER — Encounter: Payer: Self-pay | Admitting: Obstetrics and Gynecology

## 2022-08-25 LAB — COMPREHENSIVE METABOLIC PANEL
ALT: 16 U/L (ref 0–44)
AST: 15 U/L (ref 15–41)
Albumin: 2.6 g/dL — ABNORMAL LOW (ref 3.5–5.0)
Alkaline Phosphatase: 58 U/L (ref 38–126)
Anion gap: 12 (ref 5–15)
BUN: 12 mg/dL (ref 6–20)
CO2: 20 mmol/L — ABNORMAL LOW (ref 22–32)
Calcium: 9.3 mg/dL (ref 8.9–10.3)
Chloride: 103 mmol/L (ref 98–111)
Creatinine, Ser: 1.2 mg/dL — ABNORMAL HIGH (ref 0.44–1.00)
GFR, Estimated: 59 mL/min — ABNORMAL LOW (ref >60)
Glucose, Bld: 84 mg/dL (ref 70–99)
Potassium: 4.3 mmol/L (ref 3.5–5.1)
Sodium: 135 mmol/L (ref 135–145)
Total Bilirubin: 0.3 mg/dL (ref 0.3–1.2)
Total Protein: 6.3 g/dL — ABNORMAL LOW (ref 6.5–8.1)

## 2022-08-25 LAB — CBC
HCT: 31.3 % — ABNORMAL LOW (ref 36.0–46.0)
Hemoglobin: 10.7 g/dL — ABNORMAL LOW (ref 12.0–15.0)
MCH: 31.1 pg (ref 26.0–34.0)
MCHC: 34.2 g/dL (ref 30.0–36.0)
MCV: 91 fL (ref 80.0–100.0)
Platelets: 331 10*3/uL (ref 150–400)
RBC: 3.44 MIL/uL — ABNORMAL LOW (ref 3.87–5.11)
RDW: 13.3 % (ref 11.5–15.5)
WBC: 13.6 10*3/uL — ABNORMAL HIGH (ref 4.0–10.5)
nRBC: 0 % (ref 0.0–0.2)

## 2022-08-25 LAB — GLUCOSE, CAPILLARY
Glucose-Capillary: 96 mg/dL (ref 70–99)
Glucose-Capillary: 99 mg/dL (ref 70–99)
Glucose-Capillary: 103 mg/dL — ABNORMAL HIGH (ref 70–99)
Glucose-Capillary: 49 mg/dL — ABNORMAL LOW (ref 70–99)
Glucose-Capillary: 94 mg/dL (ref 70–99)
Glucose-Capillary: 129 mg/dL — ABNORMAL HIGH (ref 70–99)

## 2022-08-25 MED ORDER — FAMOTIDINE 20 MG PO TABS
20.0000 mg | ORAL_TABLET | Freq: Two times a day (BID) | ORAL | Status: DC
  Administered 2022-08-25 – 2022-08-30 (×10): 20 mg via ORAL
  Filled 2022-08-25 (×10): qty 1

## 2022-08-25 NOTE — Progress Notes (Signed)
Date and time results received: 08/25/22 1810   Test: Cbg   Critical Value: 49  Name of Provider Notified: Dr. Para March  Orders Received? Or Actions Taken?: Actions Taken: 4 Oz juice given  After juice: Cbg 94

## 2022-08-25 NOTE — Progress Notes (Signed)
Patient ID: Selena Meyer, female   DOB: April 19, 1985, 37 y.o.   MRN: [id number] ACULTY PRACTICE ANTEPARTUM COMPREHENSIVE PROGRESS NOTE  Selena Meyer is a 37 y.o. G1P0 at [redacted]w[redacted]d  who is admitted for [facility] with SIPEC and type 2 DM.   Fetal presentation is unsure. Length of Stay:  8  Days  Subjective: Pt denies HA or visual changes this morning Patient reports good fetal movement.  She reports no uterine contractions, no bleeding and no loss of fluid per vagina.  Vitals:  Blood pressure 127/74, pulse 89, temperature 98 F (36.7 C), temperature source Oral, resp. rate 18, height 5\' 5"  (1.651 m), weight 87.5 kg, last menstrual period 01/18/2022, SpO2 98%.  Physical Examination: Lunges clear Heart RRR Abd soft + BS gravid Ext non tender  Fetal Monitoring:  Baseline: 140 bpm, Variability: Good {> 6 bpm), Accelerations: Reactive, and Decelerations: Absent  Labs:  Results for orders placed or performed during the hospital encounter of 08/17/22 (from the past 24 hour(s))  Glucose, capillary   Collection Time: 08/24/22 11:55 AM  Result Value Ref Range   Glucose-Capillary 75 70 - 99 mg/dL  Glucose, capillary   Collection Time: 08/24/22  3:48 PM  Result Value Ref Range   Glucose-Capillary 52 (L) 70 - 99 mg/dL  Glucose, capillary   Collection Time: 08/24/22  4:10 PM  Result Value Ref Range   Glucose-Capillary 74 70 - 99 mg/dL  Glucose, capillary   Collection Time: 08/24/22  9:36 PM  Result Value Ref Range   Glucose-Capillary 115 (H) 70 - 99 mg/dL  Glucose, capillary   Collection Time: 08/25/22  6:00 AM  Result Value Ref Range   Glucose-Capillary 96 70 - 99 mg/dL    Imaging Studies:    NA   Medications:  Scheduled  hydrALAZINE  25 mg Oral Q6H   insulin aspart  0-16 Units Subcutaneous TID PC   insulin aspart  7 Units Subcutaneous TID WC   insulin glargine-yfgn  9 Units Subcutaneous BID   labetalol  800 mg Oral Q8H   NIFEdipine  60 mg Oral Daily   NIFEdipine  30 mg Oral QHS    prenatal multivitamin  1 tablet Oral Q1200   sodium chloride flush  3 mL Intravenous Q12H   I have reviewed the patient's current medications.  ASSESSMENT: IUP 39 2/7  CHTN with SIPEC Type 2 DM  CKD Fetal ventriculomegaly   PLAN: Stable BP controlled with current regiment. Fetal well being reassuring. CBG's controlled. S/P Mag and BMZ. NICU consult ordered. In house management until 34 weeks, delivery at that time or for maternal/fetal indications Continue routine antenatal care.   Hermina Staggers 08/25/2022,10:01 AM

## 2022-08-26 LAB — GLUCOSE, CAPILLARY
Glucose-Capillary: 92 mg/dL (ref 70–99)
Glucose-Capillary: 176 mg/dL — ABNORMAL HIGH (ref 70–99)
Glucose-Capillary: 123 mg/dL — ABNORMAL HIGH (ref 70–99)
Glucose-Capillary: 72 mg/dL (ref 70–99)

## 2022-08-26 MED ORDER — INSULIN ASPART 100 UNIT/ML IJ SOLN
5.0000 [IU] | Freq: Three times a day (TID) | INTRAMUSCULAR | Status: DC
  Administered 2022-08-26 – 2022-08-30 (×11): 5 [IU] via SUBCUTANEOUS

## 2022-08-26 MED ORDER — INSULIN GLARGINE-YFGN 100 UNIT/ML ~~LOC~~ SOLN
7.0000 [IU] | Freq: Two times a day (BID) | SUBCUTANEOUS | Status: DC
  Administered 2022-08-26 – 2022-08-30 (×8): 7 [IU] via SUBCUTANEOUS
  Filled 2022-08-26 (×8): qty 0.07

## 2022-08-26 NOTE — Progress Notes (Signed)
Patient ID: Selena Meyer, female   DOB: 1985/05/04, 37 y.o.   MRN: [id number] ACULTY PRACTICE ANTEPARTUM COMPREHENSIVE PROGRESS NOTE  Selena Meyer is a 37 y.o. G1P0 at [redacted]w[redacted]d  who is admitted for [facility] with SIPE, A2DM and CKD.   Fetal presentation is cephalic. Length of Stay:  9  Days  Subjective: Pt without complaints this morning. She thinks she might be getting to much insulin. Had 2 episodes of hypoglycemia yesterday.  Patient reports good fetal movement.  She reports no uterine contractions, no bleeding and no loss of fluid per vagina.  Vitals:  Blood pressure 124/68, pulse 86, temperature 98.4 F (36.9 C), temperature source Oral, resp. rate 18, height 5\' 5"  (1.651 m), weight 87.5 kg, last menstrual period 01/18/2022, SpO2 97%.  Physical Examination: Lungs clear Heart RRR Abd soft + BS gravid Ext non tender  Fetal Monitoring:  Baseline: 130-140's bpm, Variability: Good {> 6 bpm), Accelerations: Reactive, and Decelerations: Absent  Labs:  Results for orders placed or performed during the hospital encounter of 08/17/22 (from the past 24 hour(s))  Glucose, capillary   Collection Time: 08/25/22 12:35 PM  Result Value Ref Range   Glucose-Capillary 99 70 - 99 mg/dL  Glucose, capillary   Collection Time: 08/25/22  3:52 PM  Result Value Ref Range   Glucose-Capillary 103 (H) 70 - 99 mg/dL  Glucose, capillary   Collection Time: 08/25/22  6:10 PM  Result Value Ref Range   Glucose-Capillary 49 (L) 70 - 99 mg/dL  Glucose, capillary   Collection Time: 08/25/22  6:29 PM  Result Value Ref Range   Glucose-Capillary 94 70 - 99 mg/dL  Glucose, capillary   Collection Time: 08/25/22 10:11 PM  Result Value Ref Range   Glucose-Capillary 129 (H) 70 - 99 mg/dL  Glucose, capillary   Collection Time: 08/26/22  6:01 AM  Result Value Ref Range   Glucose-Capillary 92 70 - 99 mg/dL    Imaging Studies:    NA   Medications:  Scheduled  famotidine  20 mg Oral BID   hydrALAZINE  25 mg Oral Q6H    insulin aspart  5 Units Subcutaneous TID WC   insulin glargine-yfgn  7 Units Subcutaneous BID   labetalol  800 mg Oral Q8H   NIFEdipine  60 mg Oral Daily   NIFEdipine  30 mg Oral QHS   prenatal multivitamin  1 tablet Oral Q1200   sodium chloride flush  3 mL Intravenous Q12H   I have reviewed the patient's current medications.  ASSESSMENT: IUP 31 3/7 weeks CHTN with SIPEC A2DM CKD Fetal venttriculomegaly  PLAN: Stable. BP stable with current regiment. Crt stable. Will adjust insulin regiment and d/c SSI and follow CBG's. Fetal well being reassuring. In house management til 34 weeks or per MFM recommendation. Continue routine antenatal care.   Hermina Staggers 08/26/2022,10:24 AM

## 2022-08-26 NOTE — Consult Note (Signed)
[facility]  Prenatal Consult       08/26/2022  8:49 PM   I was asked by Dr. Alysia Penna to consult on this patient for anticipated preterm delivery. I had the pleasure of meeting with [name] and her husband today. She is a 37 year old G1P0 woman currently at [redacted]w[redacted]d. 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 Fetal echocardiogram was normal. She and her partner is/are expecting a baby girl, to be named Talia (sp?). This is the first baby for both of them. Last EFW on 08/03/2022 was 1132 g (~30th%ile). At this point in time, the plan is to remain inpatient for medical management and fetal monitoring with a goal of delivery at 34w GA, sooner if indicated.  I explained that the neonatal intensive care team would be present for the delivery and outlined the likely delivery room course for this baby including routine resuscitation and NRP-guided approaches to the treatment of respiratory distress. We discussed other common problems associated with prematurity including respiratory distress syndrome/CLD, apnea, feeding issues, temperature regulation, and infection risk. We briefly discussed IVH/PVL, ROP, and NEC and that these are complications associated with prematurity, but that by 30 weeks are uncommon.   We discussed the prenatal findings of mild ventriculomegaly, possible Blake's Pouch Cyst, and the prior finding of abnormal cerebellar shape that seems to have normalized. Maternal toxoplasmosis and CMV screening was negative. She is supposed to have a fetal brain MRI upcoming at [facility] but will not be able to attend if inpatient. We discussed that regardless, the plan would be to obtain a head [place] after she is born to  further evaluate, and if needed/indicated, she could have a post-natal brain MRI.  We discussed the average length of stay but I noted that the actual LOS would depend on the severity of problems encountered and response to treatments and will change with the gestational age at which she is delivered. We discussed visitation policies and the resources available while her child is in the hospital. They are interested in touring the NICU if this is possible (I will look into this).   We discussed the importance of good nutrition and various methods of providing nutrition (parenteral hyperalimentation, gavage feedings and/or oral feeding). We discussed the benefits of human milk. I encouraged breast feeding and pumping soon after birth and outlined resources that are available to support breast feeding. We discussed the possibility of using donor breast milk as a bridge and she and her partner with consider this.  Thank you for involving [place] in the care of this patient. A member of our team will be available should the family have additional questions.  Time for consultation: approximately 20 minutes of face-to-face time in discussion of the risks and medical care associated with preterm delivery.  Jacob Moores, MD Neonatal Medicine

## 2022-08-27 DIAGNOSIS — Z3A31 31 weeks gestation of pregnancy: Secondary | ICD-10-CM

## 2022-08-27 LAB — GLUCOSE, CAPILLARY
Glucose-Capillary: 119 mg/dL — ABNORMAL HIGH (ref 70–99)
Glucose-Capillary: 161 mg/dL — ABNORMAL HIGH (ref 70–99)
Glucose-Capillary: 183 mg/dL — ABNORMAL HIGH (ref 70–99)
Glucose-Capillary: 89 mg/dL (ref 70–99)
Glucose-Capillary: 96 mg/dL (ref 70–99)

## 2022-08-27 NOTE — Progress Notes (Signed)
FACULTY PRACTICE ANTEPARTUM COMPREHENSIVE PROGRESS NOTE  Selena Meyer is a 37 y.o. G1P0 at [redacted]w[redacted]d who is admitted for cHTN, CKD/FSGS, GDMA2.  Estimated Date of Delivery: 10/25/22 Fetal presentation is unsure.  Length of Stay:  10 Days. Admitted 08/17/2022  Subjective: No HA/BV/RUQ pain. No concerns.   Vitals:  Blood pressure (!) 140/72, pulse 90, temperature 98.4 F (36.9 C), temperature source Oral, resp. rate 16, height 5\' 5"  (1.651 m), weight 87.5 kg, last menstrual period 01/18/2022, SpO2 100%. Physical Examination: CONSTITUTIONAL: Well-developed, well-nourished female in no acute distress.  NEUROLOGIC: Alert and oriented to person, place, and time. CARDIOVASCULAR: Normal heart rate noted RESPIRATORY: Effort normal, no problems with respiration noted ABDOMEN: Soft, nontender, nondistended, gravid.  Fetal monitoring: FHR: 145 bpm, Variability: moderate, Accelerations: 10x10, Decelerations: Absent  Uterine activity: 0 contractions per hour  Results for orders placed or performed during the hospital encounter of 08/17/22 (from the past 48 hour(s))  CBC     Status: Abnormal   Collection Time: 08/25/22 10:16 AM  Result Value Ref Range   WBC 13.6 (H) 4.0 - 10.5 K/uL   RBC 3.44 (L) 3.87 - 5.11 MIL/uL   Hemoglobin 10.7 (L) 12.0 - 15.0 g/dL   HCT 78.2 (L) 95.6 - 21.3 %   MCV 91.0 80.0 - 100.0 fL   MCH 31.1 26.0 - 34.0 pg   MCHC 34.2 30.0 - 36.0 g/dL   RDW 08.6 57.8 - 46.9 %   Platelets 331 150 - 400 K/uL   nRBC 0.0 0.0 - 0.2 %    Comment: Performed at [facility], [address] [place], [place] [address]  Comprehensive metabolic panel     Status: Abnormal   Collection Time: 08/25/22 10:16 AM  Result Value Ref Range   Sodium 135 135 - 145 mmol/L   Potassium 4.3 3.5 - 5.1 mmol/L   Chloride 103 98 - 111 mmol/L   CO2 20 (L) 22 - 32 mmol/L   Glucose, Bld 84 70 - 99 mg/dL    Comment: Glucose reference range applies only to samples taken after fasting for at least 8 hours.    BUN 12 6 - 20 mg/dL   Creatinine, Ser 8.41 (H) 0.44 - 1.00 mg/dL   Calcium 9.3 8.9 - 32.4 mg/dL   Total Protein 6.3 (L) 6.5 - 8.1 g/dL   Albumin 2.6 (L) 3.5 - 5.0 g/dL   AST 15 15 - 41 U/L   ALT 16 0 - 44 U/L   Alkaline Phosphatase 58 38 - 126 U/L   Total Bilirubin 0.3 0.3 - 1.2 mg/dL   GFR, Estimated 59 (L) >60 mL/min    Comment: (NOTE) Calculated using the CKD-EPI Creatinine Equation (2021)    Anion gap 12 5 - 15    Comment: Performed at [facility], [address], [place], [place] [address]  Glucose, capillary     Status: None   Collection Time: 08/25/22 12:35 PM  Result Value Ref Range   Glucose-Capillary 99 70 - 99 mg/dL    Comment: Glucose reference range applies only to samples taken after fasting for at least 8 hours.  Glucose, capillary     Status: Abnormal   Collection Time: 08/25/22  3:52 PM  Result Value Ref Range   Glucose-Capillary 103 (H) 70 - 99 mg/dL    Comment: Glucose reference range applies only to samples taken after fasting for at least 8 hours.  Glucose, capillary     Status: Abnormal   Collection  Time: 08/25/22  6:10 PM  Result Value Ref Range   Glucose-Capillary 49 (L) 70 - 99 mg/dL    Comment: Glucose reference range applies only to samples taken after fasting for at least 8 hours.  Glucose, capillary     Status: None   Collection Time: 08/25/22  6:29 PM  Result Value Ref Range   Glucose-Capillary 94 70 - 99 mg/dL    Comment: Glucose reference range applies only to samples taken after fasting for at least 8 hours.  Glucose, capillary     Status: Abnormal   Collection Time: 08/25/22 10:11 PM  Result Value Ref Range   Glucose-Capillary 129 (H) 70 - 99 mg/dL    Comment: Glucose reference range applies only to samples taken after fasting for at least 8 hours.  Glucose, capillary     Status: None   Collection Time: 08/26/22  6:01 AM  Result Value Ref Range   Glucose-Capillary 92 70 - 99 mg/dL    Comment: Glucose reference range applies only to  samples taken after fasting for at least 8 hours.  Glucose, capillary     Status: Abnormal   Collection Time: 08/26/22 11:02 AM  Result Value Ref Range   Glucose-Capillary 176 (H) 70 - 99 mg/dL    Comment: Glucose reference range applies only to samples taken after fasting for at least 8 hours.  Glucose, capillary     Status: Abnormal   Collection Time: 08/26/22  3:27 PM  Result Value Ref Range   Glucose-Capillary 123 (H) 70 - 99 mg/dL    Comment: Glucose reference range applies only to samples taken after fasting for at least 8 hours.  Glucose, capillary     Status: None   Collection Time: 08/26/22  4:37 PM  Result Value Ref Range   Glucose-Capillary 72 70 - 99 mg/dL    Comment: Glucose reference range applies only to samples taken after fasting for at least 8 hours.  Glucose, capillary     Status: Abnormal   Collection Time: 08/27/22 12:22 AM  Result Value Ref Range   Glucose-Capillary 161 (H) 70 - 99 mg/dL    Comment: Glucose reference range applies only to samples taken after fasting for at least 8 hours.  Glucose, capillary     Status: None   Collection Time: 08/27/22  6:10 AM  Result Value Ref Range   Glucose-Capillary 89 70 - 99 mg/dL    Comment: Glucose reference range applies only to samples taken after fasting for at least 8 hours.    No results found.  Current scheduled medications  famotidine  20 mg Oral BID   hydrALAZINE  25 mg Oral Q6H   insulin aspart  5 Units Subcutaneous TID WC   insulin glargine-yfgn  7 Units Subcutaneous BID   labetalol  800 mg Oral Q8H   NIFEdipine  60 mg Oral Daily   NIFEdipine  30 mg Oral QHS   prenatal multivitamin  1 tablet Oral Q1200   sodium chloride flush  3 mL Intravenous Q12H    I have reviewed the patient's current medications.  ASSESSMENT: Principal Problem:   Hypertensive crisis Active Problems:   FSGS (focal segmental glomerulosclerosis)   Gestational diabetes mellitus (GDM), antepartum   Chronic hypertension during  pregnancy, antepartum   Kidney disease in pregnancy, unspecified trimester   Morbid obesity (HCC)   Hypertension affecting pregnancy   PLAN: 37 yo G1P0 at [redacted]w[redacted]d with worsening cHTN vs SIPE w/ SF (BP), CKD/FSGS, GDMA2   Worsening cHTN  vs SIPE, CKD - asymptomatic, s/p mag 4/2 x 24h - continues to have normal to mild range blood pressures that are well controlled on her regimen since 8/13 (date of last dose adjustment). Procardia XL 60/30, labetalol 800mg  TID and hydral 25mg  q6h - Cr has normalized, now 1.2  - Will continue inpatient management. Still unclear if SIPE vs worsened cHTN. Reviewed in depth with the patient the difficulty in knowing for sure given underlying kidney disease. Reviewed also the spectrum of disease with preeclampsia and how it can rapidly develop/worsen and thus inpatient management. Reviewed timing of delivery will be dependent on her blood pressures and ongoing kidney function.  - Reviewed mode of delivery typically based on fetal position unless she were to develop HELLP syndrome remote from delivery.  - Has appt with renal doctor w/[facility] coming up but has not yet cancelled. Not within the epic system.    2. GDMA2 - metformin dc'd due to Cr - semglee 7u BID w/ NVG 5u TID meals - DM coordinator following   3. FWB - S/p mag as above & BMZ 8/8-9 - NST BID. Plan for twice weekly BPP at 32 weeks.  - Growth 7/25 @ 28/1 1132g (26%), AC 51%, cephalic - Known fetal ventriculomegaly, planned for outpatient fetal MRI 8/27. Will need to address options for work up with MFM - s/p NICU consult   Continue routine antenatal care.   Milas Hock, MD Attending Obstetrician & Gynecologist, [facility] for [facility], [facility]

## 2022-08-28 LAB — COMPREHENSIVE METABOLIC PANEL
ALT: 13 U/L (ref 0–44)
AST: 13 U/L — ABNORMAL LOW (ref 15–41)
Albumin: 2.2 g/dL — ABNORMAL LOW (ref 3.5–5.0)
Alkaline Phosphatase: 54 U/L (ref 38–126)
Anion gap: 9 (ref 5–15)
BUN: 10 mg/dL (ref 6–20)
CO2: 20 mmol/L — ABNORMAL LOW (ref 22–32)
Calcium: 8.5 mg/dL — ABNORMAL LOW (ref 8.9–10.3)
Chloride: 104 mmol/L (ref 98–111)
Creatinine, Ser: 1.11 mg/dL — ABNORMAL HIGH (ref 0.44–1.00)
GFR, Estimated: >60 mL/min (ref >60)
Glucose, Bld: 98 mg/dL (ref 70–99)
Potassium: 4 mmol/L (ref 3.5–5.1)
Sodium: 133 mmol/L — ABNORMAL LOW (ref 135–145)
Total Bilirubin: 0.4 mg/dL (ref 0.3–1.2)
Total Protein: 5.6 g/dL — ABNORMAL LOW (ref 6.5–8.1)

## 2022-08-28 LAB — CBC
HCT: 27.6 % — ABNORMAL LOW (ref 36.0–46.0)
Hemoglobin: 9.2 g/dL — ABNORMAL LOW (ref 12.0–15.0)
MCH: 30.6 pg (ref 26.0–34.0)
MCHC: 33.3 g/dL (ref 30.0–36.0)
MCV: 91.7 fL (ref 80.0–100.0)
Platelets: 303 10*3/uL (ref 150–400)
RBC: 3.01 MIL/uL — ABNORMAL LOW (ref 3.87–5.11)
RDW: 13.5 % (ref 11.5–15.5)
WBC: 11.5 10*3/uL — ABNORMAL HIGH (ref 4.0–10.5)
nRBC: 0 % (ref 0.0–0.2)

## 2022-08-28 LAB — GLUCOSE, CAPILLARY
Glucose-Capillary: 70 mg/dL (ref 70–99)
Glucose-Capillary: 87 mg/dL (ref 70–99)
Glucose-Capillary: 140 mg/dL — ABNORMAL HIGH (ref 70–99)
Glucose-Capillary: 74 mg/dL (ref 70–99)

## 2022-08-28 NOTE — Progress Notes (Signed)
FACULTY PRACTICE ANTEPARTUM COMPREHENSIVE PROGRESS NOTE  Selena Meyer is a 37 y.o. G1P0 at [redacted]w[redacted]d who is admitted for cHTN, CKD/FSGS, GDMA2.  Estimated Date of Delivery: 10/25/22 Fetal presentation is unsure.  Length of Stay:  11 Days. Admitted 08/17/2022  Subjective: No HA/BV/RUQ pain. No concerns.   Vitals:  Blood pressure 134/75, pulse 89, temperature 98.3 F (36.8 C), temperature source Oral, resp. rate 18, height 5\' 5"  (1.651 m), weight 87.5 kg, last menstrual period 01/18/2022, SpO2 100%. Physical Examination: CONSTITUTIONAL: Well-developed, well-nourished female in no acute distress.  NEUROLOGIC: Alert and oriented to person, place, and time. CARDIOVASCULAR: Normal heart rate noted RESPIRATORY: Effort normal, no problems with respiration noted ABDOMEN: Soft, nontender, nondistended, gravid.  Fetal monitoring: FHR: 135 bpm, Variability: moderate, Accelerations: 10x10, Decelerations: Absent  Uterine activity: 0 contractions per hour  Results for orders placed or performed during the hospital encounter of 08/17/22 (from the past 48 hour(s))  Glucose, capillary     Status: Abnormal   Collection Time: 08/26/22 11:02 AM  Result Value Ref Range   Glucose-Capillary 176 (H) 70 - 99 mg/dL    Comment: Glucose reference range applies only to samples taken after fasting for at least 8 hours.  Glucose, capillary     Status: Abnormal   Collection Time: 08/26/22  3:27 PM  Result Value Ref Range   Glucose-Capillary 123 (H) 70 - 99 mg/dL    Comment: Glucose reference range applies only to samples taken after fasting for at least 8 hours.  Glucose, capillary     Status: None   Collection Time: 08/26/22  4:37 PM  Result Value Ref Range   Glucose-Capillary 72 70 - 99 mg/dL    Comment: Glucose reference range applies only to samples taken after fasting for at least 8 hours.  Glucose, capillary     Status: Abnormal   Collection Time: 08/27/22 12:22 AM  Result Value Ref Range    Glucose-Capillary 161 (H) 70 - 99 mg/dL    Comment: Glucose reference range applies only to samples taken after fasting for at least 8 hours.  Glucose, capillary     Status: None   Collection Time: 08/27/22  6:10 AM  Result Value Ref Range   Glucose-Capillary 89 70 - 99 mg/dL    Comment: Glucose reference range applies only to samples taken after fasting for at least 8 hours.  Glucose, capillary     Status: Abnormal   Collection Time: 08/27/22  2:17 PM  Result Value Ref Range   Glucose-Capillary 119 (H) 70 - 99 mg/dL    Comment: Glucose reference range applies only to samples taken after fasting for at least 8 hours.  Glucose, capillary     Status: Abnormal   Collection Time: 08/27/22  8:15 PM  Result Value Ref Range   Glucose-Capillary 183 (H) 70 - 99 mg/dL    Comment: Glucose reference range applies only to samples taken after fasting for at least 8 hours.   Comment 1 Notify RN   Glucose, capillary     Status: None   Collection Time: 08/27/22 10:42 PM  Result Value Ref Range   Glucose-Capillary 96 70 - 99 mg/dL    Comment: Glucose reference range applies only to samples taken after fasting for at least 8 hours.  Glucose, capillary     Status: None   Collection Time: 08/28/22  6:03 AM  Result Value Ref Range   Glucose-Capillary 70 70 - 99 mg/dL    Comment: Glucose reference range applies only  to samples taken after fasting for at least 8 hours.    No results found.  Current scheduled medications  famotidine  20 mg Oral BID   hydrALAZINE  25 mg Oral Q6H   insulin aspart  5 Units Subcutaneous TID WC   insulin glargine-yfgn  7 Units Subcutaneous BID   labetalol  800 mg Oral Q8H   NIFEdipine  60 mg Oral Daily   NIFEdipine  30 mg Oral QHS   prenatal multivitamin  1 tablet Oral Q1200   sodium chloride flush  3 mL Intravenous Q12H    I have reviewed the patient's current medications.  ASSESSMENT: Principal Problem:   Hypertensive crisis Active Problems:   FSGS (focal  segmental glomerulosclerosis)   Gestational diabetes mellitus (GDM), antepartum   Chronic hypertension during pregnancy, antepartum   Kidney disease in pregnancy, unspecified trimester   Morbid obesity (HCC)   Hypertension affecting pregnancy   PLAN: 37 yo G1P0 at [redacted]w[redacted]d with worsening cHTN vs SIPE w/ SF (BP), CKD/FSGS, GDMA2   Worsening cHTN vs SIPE, CKD - asymptomatic, s/p mag 4/2 x 24h - continues to have normal to mild range blood pressures that are well controlled on her regimen since 8/13 (date of last dose adjustment). Procardia XL 60/30, labetalol 800mg  TID and hydral 25mg  q6h - Cr has normalized, now 1.2. Due for labs this morning (ordered but not yet drawn).  - Will continue inpatient management. Still unclear if SIPE vs worsened cHTN. Reviewed in depth with the patient the difficulty in knowing for sure given underlying kidney disease. Reviewed also the spectrum of disease with preeclampsia and how it can rapidly develop/worsen and thus inpatient management. Reviewed timing of delivery will be dependent on her blood pressures and ongoing kidney function.  - Reviewed mode of delivery typically based on fetal position unless she were to develop HELLP syndrome remote from delivery.    2. GDMA2 - metformin dc'd due to Cr - semglee 7u BID w/ NVG 5u TID meals - Fastings excellent. Some variation to meals. Discussed consistency in diet will help [place] control them better. Reviewed better control reduces risk of neonatal hypoglycemia.  - DM coordinator following   3. FWB - S/p mag as above & BMZ 8/8-9 - NST BID. Plan for twice weekly BPP at 32 weeks. Order placed for 8/21.  - Growth 7/25 @ 28/1 1132g (26%), AC 51%, cephalic - Known fetal ventriculomegaly, planned for outpatient fetal MRI 8/27. Will need to address options for work up with MFM - s/p NICU consult   Continue routine antenatal care.   Milas Hock, MD Attending Obstetrician & Gynecologist, [facility] for  [facility], [facility]

## 2022-08-28 NOTE — Inpatient Diabetes Management (Signed)
Inpatient Diabetes Program Recommendations  ADA Standards of Care 2024 Diabetes in Pregnancy Target Glucose Ranges:  Fasting: 70 - 95 mg/dL 1 hr postprandial:  952 - 140mg /dL (from first bite of meal) 2 hr postprandial:  100 - 120 mg/dL (from first bit of meal)    Lab Results  Component Value Date   GLUCAP 70 08/28/2022   HGBA1C 5.5 08/18/2022    Review of Glycemic Control  Latest Reference Range & Units 08/27/22 06:10 08/27/22 14:17 08/27/22 20:15 08/27/22 22:42 08/28/22 06:03  Glucose-Capillary 70 - 99 mg/dL 89 841 (H) 324 (H) 96 70   Diabetes history: GDM Outpatient Diabetes medications: Metformin 1000 mg QAM and 500 mg QPM Current orders for Inpatient glycemic control: Novolog 5 units tid meal coverage Semglee 7 units bid Betamethasone 12.5 mg first does on 8/8 @ 2243; 2nd dose 8/09 @ 2224   Inpatient Diabetes Program Recommendations:    -   Increase Novolog meal coverage to 6 units tid if eating >50% of meals  Continue with Semglee 7 units bid  Will continue to follow while inpatient.  Thanks, Christena Deem RN, MSN, BC-ADM Inpatient Diabetes Coordinator Team Pager [phone] (8a-5p)

## 2022-08-29 DIAGNOSIS — I169 Hypertensive crisis, unspecified: Secondary | ICD-10-CM | POA: Diagnosis not present

## 2022-08-29 LAB — GLUCOSE, CAPILLARY
Glucose-Capillary: 78 mg/dL (ref 70–99)
Glucose-Capillary: 112 mg/dL — ABNORMAL HIGH (ref 70–99)
Glucose-Capillary: 94 mg/dL (ref 70–99)
Glucose-Capillary: 116 mg/dL — ABNORMAL HIGH (ref 70–99)

## 2022-08-29 NOTE — Progress Notes (Signed)
   08/29/22 1500  Spiritual Encounters  Type of Visit Initial  Care provided to: Pt and family  Referral source Chaplain assessment  Reason for visit Routine spiritual support  OnCall Visit No  Spiritual Framework  Presenting Themes Meaning/purpose/sources of inspiration;Goals in life/care;Values and beliefs;Significant life change;Coping tools  Community/Connection Family  Patient Stress Factors Health changes  Family Stress Factors Major life changes  Interventions  Spiritual Care Interventions Made Established relationship of care and support;Compassionate presence;Reflective listening;Normalization of emotions  Intervention Outcomes  Outcomes Connection to spiritual care;Awareness around self/spiritual resourses;Connection to values and goals of care;Awareness of support  Spiritual Care Plan  Spiritual Care Issues Still Outstanding No further spiritual care needs at this time (see row info)    The chaplain visited the patient Selena Meyer. She is in the 32 weeks of pregnancy. Due to her high blood pressure, she has some risks during her pregnancy. She said that she and her partner have been waiting for 15 years to have a child. The pt tried one time for fertility treatment, but the consequences were not that they expected. For this time, she got pregnant without any medical treatment. She and her partner were very surprised and excited when they first found out. The patient is very eager to see the baby. The chaplain listened her story attentively, normalized emotions, and provided spiritual support.   M.Kubra Franki Stemen, MA Chaplain Intern [phone]

## 2022-08-29 NOTE — Consult Note (Signed)
  MFM Inpatient CONSULT  Selena Meyer is a 37 y.o. 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  She denies headaches, blurry vision, chest pain, shortness of breath or increased swelling.  Her blood pressure has been well-controlled for over 48 hours.  Assessment - CHTN - CKD w/ FSGS - GDMA2 Plan -If BP is at goal overnight she can be discharged -Weekly CMP/CBC and BP check. Maintain a high level of concern for superimposed preeclampsia.  -Twice weekly antenatal testing  -Growth every 3-4 weeks -Continue antihypertensives: Labetalol and her milligrams 3 times daily, Procardia 60/30 mg daily, and hydralazine 25 mg q6h as needed.  Goal blood pressure is less than 130/80.  If blood pressure is not controlled as an outpatient, consider readmission if blood pressure becomes difficult to control. -Delivery timing pending clinical course, but if she requires max dose Procardia to achieve BP goal then consider 34-[redacted] weeks gestation. IF she remains on her current dosing at goal we can consider 36-[redacted] weeks gestation as long as there is no concern for worsening renal function or superimposed preeclampsia.   Review of Systems: A review of systems was performed and was negative except per HPI   Vitals and Physical Exam    08/29/2022    2:51 PM 08/29/2022    1:12 PM 08/29/2022    7:29 AM  Vitals with BMI  Systolic 132 122 811  Diastolic 70 61 63  Pulse 85 91 87  Sitting comfortably on the hospital bed Nonlabored breathing Normal rate and rhythm Abdomen is nontender  Past pregnancies OB History  Gravida Para Term Preterm AB Living  1            SAB IAB Ectopic Multiple Live Births               # Outcome Date GA Lbr Len/2nd Weight Sex Type Anes PTL Lv  1 Current              I spent 45 minutes reviewing the patients chart, including labs and images as well as counseling the patient  about her medical conditions. Greater than 50% of the time was spent in direct face-to-face patient counseling.  Braxton Feathers  MFM, [facility]   08/29/2022  3:31 PM

## 2022-08-29 NOTE — Progress Notes (Signed)
FACULTY PRACTICE ANTEPARTUM PROGRESS NOTE  RANETTE HARMSEN is a 37 y.o. G1P0 at [redacted]w[redacted]d who is admitted for cHTN, CKD/FSGS, A2 GDM.  Estimated Date of Delivery: 10/25/22 Fetal presentation is unsure.  Length of Stay:  12 Days. Admitted 08/17/2022  Subjective: Pt is doing well today.  She denies headache, visual changes and RUQ pain. Patient reports normal fetal movement.  She denies uterine contractions, denies bleeding and leaking of fluid per vagina.  Vitals:  Blood pressure 123/63, pulse 87, temperature 98.8 F (37.1 C), temperature source Oral, resp. rate 18, height 5\' 5"  (1.651 m), weight 87.5 kg, last menstrual period 01/18/2022, SpO2 97%. Physical Examination: CONSTITUTIONAL: Well-developed, well-nourished female in no acute distress.  HENT:  Normocephalic, atraumatic, External right and left ear normal. Oropharynx is clear and moist EYES: Conjunctivae and EOM are normal.  NECK: Normal range of motion, supple, no masses. SKIN: Skin is warm and dry. No rash noted. Not diaphoretic. No erythema. No pallor. NEUROLGIC: Alert and oriented to person, place, and time. Normal reflexes, muscle tone coordination. No cranial nerve deficit noted. PSYCHIATRIC: Normal mood and affect. Normal behavior. Normal judgment and thought content. CARDIOVASCULAR: Normal heart rate noted, regular rhythm RESPIRATORY: Effort and breath sounds normal, no problems with respiration noted MUSCULOSKELETAL: Normal range of motion. No edema and no tenderness. ABDOMEN: Soft, nontender, nondistended, gravid. CERVIX: deferred  Fetal monitoring: FHR: 140s bpm, Variability: moderate, Accelerations: Present, Decelerations: Absent , reassuring for gestational age, category 1 strip Uterine activity: none  Results for orders placed or performed during the hospital encounter of 08/17/22 (from the past 48 hour(s))  Glucose, capillary     Status: Abnormal   Collection Time: 08/27/22  2:17 PM  Result Value Ref Range    Glucose-Capillary 119 (H) 70 - 99 mg/dL    Comment: Glucose reference range applies only to samples taken after fasting for at least 8 hours.  Glucose, capillary     Status: Abnormal   Collection Time: 08/27/22  8:15 PM  Result Value Ref Range   Glucose-Capillary 183 (H) 70 - 99 mg/dL    Comment: Glucose reference range applies only to samples taken after fasting for at least 8 hours.   Comment 1 Notify RN   Glucose, capillary     Status: None   Collection Time: 08/27/22 10:42 PM  Result Value Ref Range   Glucose-Capillary 96 70 - 99 mg/dL    Comment: Glucose reference range applies only to samples taken after fasting for at least 8 hours.  Glucose, capillary     Status: None   Collection Time: 08/28/22  6:03 AM  Result Value Ref Range   Glucose-Capillary 70 70 - 99 mg/dL    Comment: Glucose reference range applies only to samples taken after fasting for at least 8 hours.  CBC     Status: Abnormal   Collection Time: 08/28/22  9:58 AM  Result Value Ref Range   WBC 11.5 (H) 4.0 - 10.5 K/uL   RBC 3.01 (L) 3.87 - 5.11 MIL/uL   Hemoglobin 9.2 (L) 12.0 - 15.0 g/dL   HCT 32.4 (L) 40.1 - 02.7 %   MCV 91.7 80.0 - 100.0 fL   MCH 30.6 26.0 - 34.0 pg   MCHC 33.3 30.0 - 36.0 g/dL   RDW 25.3 66.4 - 40.3 %   Platelets 303 150 - 400 K/uL   nRBC 0.0 0.0 - 0.2 %    Comment: Performed at [facility], [address], [place], [place] [address]  Comprehensive metabolic panel     Status: Abnormal   Collection Time: 08/28/22  9:58 AM  Result Value Ref Range   Sodium 133 (L) 135 - 145 mmol/L   Potassium 4.0 3.5 - 5.1 mmol/L   Chloride 104 98 - 111 mmol/L   CO2 20 (L) 22 - 32 mmol/L   Glucose, Bld 98 70 - 99 mg/dL    Comment: Glucose reference range applies only to samples taken after fasting for at least 8 hours.   BUN 10 6 - 20 mg/dL   Creatinine, Ser 4.09 (H) 0.44 - 1.00 mg/dL   Calcium 8.5 (L) 8.9 - 10.3 mg/dL   Total Protein 5.6 (L) 6.5 - 8.1 g/dL   Albumin 2.2 (L) 3.5 - 5.0 g/dL    AST 13 (L) 15 - 41 U/L   ALT 13 0 - 44 U/L   Alkaline Phosphatase 54 38 - 126 U/L   Total Bilirubin 0.4 0.3 - 1.2 mg/dL   GFR, Estimated >81 >19 mL/min    Comment: (NOTE) Calculated using the CKD-EPI Creatinine Equation (2021)    Anion gap 9 5 - 15    Comment: Performed at [facility], [address], [place], [place] [address]  Glucose, capillary     Status: None   Collection Time: 08/28/22  1:06 PM  Result Value Ref Range   Glucose-Capillary 87 70 - 99 mg/dL    Comment: Glucose reference range applies only to samples taken after fasting for at least 8 hours.  Glucose, capillary     Status: Abnormal   Collection Time: 08/28/22  3:53 PM  Result Value Ref Range   Glucose-Capillary 140 (H) 70 - 99 mg/dL    Comment: Glucose reference range applies only to samples taken after fasting for at least 8 hours.  Glucose, capillary     Status: None   Collection Time: 08/28/22 10:12 PM  Result Value Ref Range   Glucose-Capillary 74 70 - 99 mg/dL    Comment: Glucose reference range applies only to samples taken after fasting for at least 8 hours.  Glucose, capillary     Status: None   Collection Time: 08/29/22  6:00 AM  Result Value Ref Range   Glucose-Capillary 78 70 - 99 mg/dL    Comment: Glucose reference range applies only to samples taken after fasting for at least 8 hours.    I have reviewed the patient's current medications.  ASSESSMENT: Principal Problem:   Hypertensive crisis Active Problems:   FSGS (focal segmental glomerulosclerosis)   Gestational diabetes mellitus (GDM), antepartum   Chronic hypertension during pregnancy, antepartum   Kidney disease in pregnancy, unspecified trimester   Morbid obesity (HCC)   Hypertension affecting pregnancy   PLAN: cHTN vs SIPE, CKD: Blood pressure well controlled on current regimen.  Procardia 60/30, labetalol 800 mg TID, hydral 25 mg q 6 hours  Cr stable and with chart review has been moderately elevated for several years.   Continue inpatient for now.  MFM to see patient today to discuss continued inpatient stay with delivery at 34-35 weeks or discharge to home with weekly BP checks and twice weekly BPP  2. A2 GDM:  CGM are well maintained.  FBS this AM was normal.  No changes to current insulin regimen  3: FWB  S/p magnesium sulfate and BMZ x 2 Pt has outpatient MRI at [facility] on 8/27.  If still inpatient may need to be discharged and readmitted to make that appointment. S/p NICU consult  Continue routine antenatal care.   Mariel Aloe, MD [facility] Acute Care Faculty Attending, [facility] for [facility] 08/29/2022 9:36 AM

## 2022-08-30 ENCOUNTER — Other Ambulatory Visit (HOSPITAL_COMMUNITY): Payer: Self-pay

## 2022-08-30 ENCOUNTER — Encounter: Payer: Self-pay | Admitting: Family

## 2022-08-30 ENCOUNTER — Other Ambulatory Visit: Payer: Self-pay | Admitting: Obstetrics and Gynecology

## 2022-08-30 ENCOUNTER — Inpatient Hospital Stay (HOSPITAL_BASED_OUTPATIENT_CLINIC_OR_DEPARTMENT_OTHER): Payer: BC Managed Care – PPO

## 2022-08-30 DIAGNOSIS — O3500X0 Maternal care for (suspected) central nervous system malformation or damage in fetus, unspecified, not applicable or unspecified: Secondary | ICD-10-CM

## 2022-08-30 DIAGNOSIS — O99013 Anemia complicating pregnancy, third trimester: Secondary | ICD-10-CM

## 2022-08-30 DIAGNOSIS — O10919 Unspecified pre-existing hypertension complicating pregnancy, unspecified trimester: Secondary | ICD-10-CM

## 2022-08-30 DIAGNOSIS — O10013 Pre-existing essential hypertension complicating pregnancy, third trimester: Secondary | ICD-10-CM

## 2022-08-30 DIAGNOSIS — O24415 Gestational diabetes mellitus in pregnancy, controlled by oral hypoglycemic drugs: Secondary | ICD-10-CM | POA: Diagnosis not present

## 2022-08-30 DIAGNOSIS — O09513 Supervision of elderly primigravida, third trimester: Secondary | ICD-10-CM

## 2022-08-30 DIAGNOSIS — Z3A32 32 weeks gestation of pregnancy: Secondary | ICD-10-CM

## 2022-08-30 DIAGNOSIS — O99333 Smoking (tobacco) complicating pregnancy, third trimester: Secondary | ICD-10-CM

## 2022-08-30 DIAGNOSIS — O24414 Gestational diabetes mellitus in pregnancy, insulin controlled: Secondary | ICD-10-CM

## 2022-08-30 DIAGNOSIS — I471 Supraventricular tachycardia, unspecified: Secondary | ICD-10-CM

## 2022-08-30 DIAGNOSIS — O99413 Diseases of the circulatory system complicating pregnancy, third trimester: Secondary | ICD-10-CM

## 2022-08-30 DIAGNOSIS — D649 Anemia, unspecified: Secondary | ICD-10-CM

## 2022-08-30 DIAGNOSIS — E669 Obesity, unspecified: Secondary | ICD-10-CM

## 2022-08-30 DIAGNOSIS — O26839 Pregnancy related renal disease, unspecified trimester: Secondary | ICD-10-CM

## 2022-08-30 DIAGNOSIS — O99213 Obesity complicating pregnancy, third trimester: Secondary | ICD-10-CM

## 2022-08-30 LAB — GLUCOSE, CAPILLARY
Glucose-Capillary: 121 mg/dL — ABNORMAL HIGH (ref 70–99)
Glucose-Capillary: 80 mg/dL (ref 70–99)
Glucose-Capillary: 93 mg/dL (ref 70–99)

## 2022-08-30 MED ORDER — OXYCODONE-ACETAMINOPHEN 5-325 MG PO TABS
1.0000 | ORAL_TABLET | Freq: Once | ORAL | Status: AC
  Administered 2022-08-30: 1 via ORAL
  Filled 2022-08-30: qty 1

## 2022-08-30 MED ORDER — BASAGLAR KWIKPEN 100 UNIT/ML ~~LOC~~ SOPN
7.0000 [IU] | PEN_INJECTOR | Freq: Two times a day (BID) | SUBCUTANEOUS | 11 refills | Status: DC
  Filled 2022-08-30: qty 3, 21d supply, fill #0

## 2022-08-30 MED ORDER — NOVOLOG FLEXPEN 100 UNIT/ML ~~LOC~~ SOPN
5.0000 [IU] | PEN_INJECTOR | Freq: Three times a day (TID) | SUBCUTANEOUS | 11 refills | Status: DC
  Filled 2022-08-30: qty 12, 80d supply, fill #0

## 2022-08-30 MED ORDER — NIFEDIPINE ER 60 MG PO TB24
60.0000 mg | ORAL_TABLET | Freq: Every day | ORAL | 1 refills | Status: DC
  Filled 2022-08-30: qty 30, 30d supply, fill #0

## 2022-08-30 MED ORDER — INSULIN PEN NEEDLE 32G X 4 MM MISC
1 refills | Status: DC
  Filled 2022-08-30: qty 100, 30d supply, fill #0
  Filled 2022-09-26: qty 100, 30d supply, fill #1

## 2022-08-30 MED ORDER — NIFEDIPINE ER 30 MG PO TB24
30.0000 mg | ORAL_TABLET | Freq: Every day | ORAL | 1 refills | Status: DC
  Filled 2022-08-30: qty 30, 30d supply, fill #0

## 2022-08-30 MED ORDER — LABETALOL HCL 200 MG PO TABS
800.0000 mg | ORAL_TABLET | Freq: Three times a day (TID) | ORAL | 1 refills | Status: DC
Start: 2022-08-30 — End: 2022-10-02
  Filled 2022-08-30: qty 80, 7d supply, fill #0
  Filled 2022-08-30: qty 85, 7d supply, fill #0
  Filled 2022-09-12: qty 165, 14d supply, fill #1
  Filled 2022-09-26: qty 165, 14d supply, fill #2

## 2022-08-30 MED ORDER — HYDRALAZINE HCL 25 MG PO TABS
25.0000 mg | ORAL_TABLET | Freq: Four times a day (QID) | ORAL | 0 refills | Status: DC
  Filled 2022-08-30: qty 120, 30d supply, fill #0

## 2022-08-30 MED ORDER — INSULIN GLARGINE-YFGN 100 UNIT/ML ~~LOC~~ SOPN
7.0000 [IU] | PEN_INJECTOR | Freq: Two times a day (BID) | SUBCUTANEOUS | 11 refills | Status: DC
  Filled 2022-08-30: qty 3, 21d supply, fill #0
  Filled 2022-09-17: qty 3, 21d supply, fill #1

## 2022-08-30 NOTE — Plan of Care (Signed)
  Problem: Education: Goal: Knowledge of disease or condition will improve Outcome: Adequate for Discharge Goal: Knowledge of the prescribed therapeutic regimen will improve Outcome: Adequate for Discharge   Problem: Fluid Volume: Goal: Peripheral tissue perfusion will improve Outcome: Adequate for Discharge   Problem: Clinical Measurements: Goal: Complications related to disease process, condition or treatment will be avoided or minimized Outcome: Adequate for Discharge   Problem: Clinical Measurements: Goal: Complications related to the disease process, condition or treatment will be avoided or minimized Outcome: Adequate for Discharge   Problem: Clinical Measurements: Goal: Ability to maintain clinical measurements within normal limits will improve Outcome: Adequate for Discharge Goal: Will remain free from infection Outcome: Adequate for Discharge Goal: Diagnostic test results will improve Outcome: Adequate for Discharge Goal: Respiratory complications will improve Outcome: Adequate for Discharge Goal: Cardiovascular complication will be avoided Outcome: Adequate for Discharge   Problem: Coping: Goal: Level of anxiety will decrease Outcome: Adequate for Discharge   Problem: Elimination: Goal: Will not experience complications related to bowel motility Outcome: Adequate for Discharge   Problem: Pain Managment: Goal: General experience of comfort will improve Outcome: Adequate for Discharge   Problem: Safety: Goal: Ability to remain free from injury will improve Outcome: Adequate for Discharge   Problem: Skin Integrity: Goal: Risk for impaired skin integrity will decrease Outcome: Adequate for Discharge   Problem: Education: Goal: Ability to describe self-care measures that may prevent or decrease complications (Diabetes Survival Skills Education) will improve Outcome: Adequate for Discharge Goal: Individualized Educational Video(s) Outcome: Adequate for  Discharge   Problem: Coping: Goal: Ability to adjust to condition or change in health will improve Outcome: Adequate for Discharge   Problem: Fluid Volume: Goal: Ability to maintain a balanced intake and output will improve Outcome: Adequate for Discharge   Problem: Health Behavior/Discharge Planning: Goal: Ability to identify and utilize available resources and services will improve Outcome: Adequate for Discharge Goal: Ability to manage health-related needs will improve Outcome: Adequate for Discharge   Problem: Metabolic: Goal: Ability to maintain appropriate glucose levels will improve Outcome: Adequate for Discharge   Problem: Skin Integrity: Goal: Risk for impaired skin integrity will decrease Outcome: Adequate for Discharge   Problem: Tissue Perfusion: Goal: Adequacy of tissue perfusion will improve Outcome: Adequate for Discharge

## 2022-08-30 NOTE — Discharge Summary (Addendum)
Antenatal Physician Discharge Summary  Patient ID: Selena Meyer MRN: [id number] DOB/AGE: [birth year] 39 y.o.  Admit date: 08/17/2022 Discharge date: 08/30/2022  Admission Diagnoses: chronic hypertension, chronic kidney disease A2 GDM,  FSGS  Discharge Diagnoses: same  Prenatal Procedures: NST and ultrasound  Consults:  Maternal Fetal Medicine  Hospital Course:  Selena Meyer is a 37 y.o. G1P0 with IUP at [redacted]w[redacted]d admitted for uncontrolled hypertension.  There was concerned if the admission blood pressure was a reflection of chronic HTN and kidney disease or superimposed preeclampsia. Pt was admitted for IV magnesium and betamethasone.   Blood pressure medications were adjusted and BP finally normalized with max dose labetalol, twice daily procardia and oral hydral. Metformin was discontinued and patient was started on subcutaneous insulin for blood sugar control.  Pt remained asymptomatic and continued inpatient fetal testing. Since labs were stable and BP was well controlled on current regimen, pt was reconsulted with MFM.  MFM felt if BP remained stable she could be discharged home with intensive fetal testing. She was deemed stable for discharge to home with outpatient follow up.  Pt had a mild headache the evening before discharge, but that had resolved with one dose of percocet.  The morning of discharge, the patient denied headache, visual changes and RUQ pain.  Discharge Exam: Temp:  [97.9 F (36.6 C)-98.4 F (36.9 C)] 98 F (36.7 C) (08/21 1132) Pulse Rate:  [79-91] 86 (08/21 1132) Resp:  [16-18] 18 (08/21 1132) BP: (116-138)/(61-79) 131/72 (08/21 1132) SpO2:  [96 %-99 %] 99 % (08/21 1132) Physical Examination: CONSTITUTIONAL: Well-developed, well-nourished female in no acute distress.  HENT:  Normocephalic, atraumatic, External right and left ear normal. Oropharynx is clear and moist EYES: Conjunctivae and EOM are normal.  NECK: Normal range of motion, supple, no masses SKIN:  Skin is warm and dry. No rash noted. Not diaphoretic. No erythema. No pallor. NEUROLGIC: Alert and oriented to person, place, and time. Normal reflexes, muscle tone coordination. No cranial nerve deficit noted. PSYCHIATRIC: Normal mood and affect. Normal behavior. Normal judgment and thought content. CARDIOVASCULAR: Normal heart rate noted, regular rhythm RESPIRATORY: Effort and breath sounds normal, no problems with respiration noted MUSCULOSKELETAL: Normal range of motion. No edema and no tenderness. 2+ distal pulses. ABDOMEN: Soft, nontender, nondistended, gravid. CERVIX:  deferred  Significant Diagnostic Studies:  Results for orders placed or performed during the hospital encounter of 08/17/22 (from the past 168 hour(s))  Glucose, capillary   Collection Time: 08/23/22  4:08 PM  Result Value Ref Range   Glucose-Capillary 201 (H) 70 - 99 mg/dL  Glucose, capillary   Collection Time: 08/23/22 10:11 PM  Result Value Ref Range   Glucose-Capillary 129 (H) 70 - 99 mg/dL  Glucose, capillary   Collection Time: 08/24/22  6:03 AM  Result Value Ref Range   Glucose-Capillary 89 70 - 99 mg/dL  Glucose, capillary   Collection Time: 08/24/22 11:55 AM  Result Value Ref Range   Glucose-Capillary 75 70 - 99 mg/dL  Glucose, capillary   Collection Time: 08/24/22  3:48 PM  Result Value Ref Range   Glucose-Capillary 52 (L) 70 - 99 mg/dL  Glucose, capillary   Collection Time: 08/24/22  4:10 PM  Result Value Ref Range   Glucose-Capillary 74 70 - 99 mg/dL  Glucose, capillary   Collection Time: 08/24/22  9:36 PM  Result Value Ref Range   Glucose-Capillary 115 (H) 70 - 99 mg/dL  Glucose, capillary   Collection Time: 08/25/22  6:00 AM  Result Value  Ref Range   Glucose-Capillary 96 70 - 99 mg/dL  CBC   Collection Time: 08/25/22 10:16 AM  Result Value Ref Range   WBC 13.6 (H) 4.0 - 10.5 K/uL   RBC 3.44 (L) 3.87 - 5.11 MIL/uL   Hemoglobin 10.7 (L) 12.0 - 15.0 g/dL   HCT 02.7 (L) 25.3 - 66.4 %    MCV 91.0 80.0 - 100.0 fL   MCH 31.1 26.0 - 34.0 pg   MCHC 34.2 30.0 - 36.0 g/dL   RDW 40.3 47.4 - 25.9 %   Platelets 331 150 - 400 K/uL   nRBC 0.0 0.0 - 0.2 %  Comprehensive metabolic panel   Collection Time: 08/25/22 10:16 AM  Result Value Ref Range   Sodium 135 135 - 145 mmol/L   Potassium 4.3 3.5 - 5.1 mmol/L   Chloride 103 98 - 111 mmol/L   CO2 20 (L) 22 - 32 mmol/L   Glucose, Bld 84 70 - 99 mg/dL   BUN 12 6 - 20 mg/dL   Creatinine, Ser 5.63 (H) 0.44 - 1.00 mg/dL   Calcium 9.3 8.9 - 87.5 mg/dL   Total Protein 6.3 (L) 6.5 - 8.1 g/dL   Albumin 2.6 (L) 3.5 - 5.0 g/dL   AST 15 15 - 41 U/L   ALT 16 0 - 44 U/L   Alkaline Phosphatase 58 38 - 126 U/L   Total Bilirubin 0.3 0.3 - 1.2 mg/dL   GFR, Estimated 59 (L) >60 mL/min   Anion gap 12 5 - 15  Glucose, capillary   Collection Time: 08/25/22 12:35 PM  Result Value Ref Range   Glucose-Capillary 99 70 - 99 mg/dL  Glucose, capillary   Collection Time: 08/25/22  3:52 PM  Result Value Ref Range   Glucose-Capillary 103 (H) 70 - 99 mg/dL  Glucose, capillary   Collection Time: 08/25/22  6:10 PM  Result Value Ref Range   Glucose-Capillary 49 (L) 70 - 99 mg/dL  Glucose, capillary   Collection Time: 08/25/22  6:29 PM  Result Value Ref Range   Glucose-Capillary 94 70 - 99 mg/dL  Glucose, capillary   Collection Time: 08/25/22 10:11 PM  Result Value Ref Range   Glucose-Capillary 129 (H) 70 - 99 mg/dL  Glucose, capillary   Collection Time: 08/26/22  6:01 AM  Result Value Ref Range   Glucose-Capillary 92 70 - 99 mg/dL  Glucose, capillary   Collection Time: 08/26/22 11:02 AM  Result Value Ref Range   Glucose-Capillary 176 (H) 70 - 99 mg/dL  Glucose, capillary   Collection Time: 08/26/22  3:27 PM  Result Value Ref Range   Glucose-Capillary 123 (H) 70 - 99 mg/dL  Glucose, capillary   Collection Time: 08/26/22  4:37 PM  Result Value Ref Range   Glucose-Capillary 72 70 - 99 mg/dL  Glucose, capillary   Collection Time: 08/27/22  12:22 AM  Result Value Ref Range   Glucose-Capillary 161 (H) 70 - 99 mg/dL  Glucose, capillary   Collection Time: 08/27/22  6:10 AM  Result Value Ref Range   Glucose-Capillary 89 70 - 99 mg/dL  Glucose, capillary   Collection Time: 08/27/22  2:17 PM  Result Value Ref Range   Glucose-Capillary 119 (H) 70 - 99 mg/dL  Glucose, capillary   Collection Time: 08/27/22  8:15 PM  Result Value Ref Range   Glucose-Capillary 183 (H) 70 - 99 mg/dL   Comment 1 Notify RN   Glucose, capillary   Collection Time: 08/27/22 10:42 PM  Result Value Ref  Range   Glucose-Capillary 96 70 - 99 mg/dL  Glucose, capillary   Collection Time: 08/28/22  6:03 AM  Result Value Ref Range   Glucose-Capillary 70 70 - 99 mg/dL  CBC   Collection Time: 08/28/22  9:58 AM  Result Value Ref Range   WBC 11.5 (H) 4.0 - 10.5 K/uL   RBC 3.01 (L) 3.87 - 5.11 MIL/uL   Hemoglobin 9.2 (L) 12.0 - 15.0 g/dL   HCT 41.6 (L) 60.6 - 30.1 %   MCV 91.7 80.0 - 100.0 fL   MCH 30.6 26.0 - 34.0 pg   MCHC 33.3 30.0 - 36.0 g/dL   RDW 60.1 09.3 - 23.5 %   Platelets 303 150 - 400 K/uL   nRBC 0.0 0.0 - 0.2 %  Comprehensive metabolic panel   Collection Time: 08/28/22  9:58 AM  Result Value Ref Range   Sodium 133 (L) 135 - 145 mmol/L   Potassium 4.0 3.5 - 5.1 mmol/L   Chloride 104 98 - 111 mmol/L   CO2 20 (L) 22 - 32 mmol/L   Glucose, Bld 98 70 - 99 mg/dL   BUN 10 6 - 20 mg/dL   Creatinine, Ser 5.73 (H) 0.44 - 1.00 mg/dL   Calcium 8.5 (L) 8.9 - 10.3 mg/dL   Total Protein 5.6 (L) 6.5 - 8.1 g/dL   Albumin 2.2 (L) 3.5 - 5.0 g/dL   AST 13 (L) 15 - 41 U/L   ALT 13 0 - 44 U/L   Alkaline Phosphatase 54 38 - 126 U/L   Total Bilirubin 0.4 0.3 - 1.2 mg/dL   GFR, Estimated >22 >02 mL/min   Anion gap 9 5 - 15  Glucose, capillary   Collection Time: 08/28/22  1:06 PM  Result Value Ref Range   Glucose-Capillary 87 70 - 99 mg/dL  Glucose, capillary   Collection Time: 08/28/22  3:53 PM  Result Value Ref Range   Glucose-Capillary 140 (H) 70  - 99 mg/dL  Glucose, capillary   Collection Time: 08/28/22 10:12 PM  Result Value Ref Range   Glucose-Capillary 74 70 - 99 mg/dL  Glucose, capillary   Collection Time: 08/29/22  6:00 AM  Result Value Ref Range   Glucose-Capillary 78 70 - 99 mg/dL  Glucose, capillary   Collection Time: 08/29/22  1:14 PM  Result Value Ref Range   Glucose-Capillary 112 (H) 70 - 99 mg/dL  Glucose, capillary   Collection Time: 08/29/22  4:36 PM  Result Value Ref Range   Glucose-Capillary 94 70 - 99 mg/dL  Glucose, capillary   Collection Time: 08/29/22  8:26 PM  Result Value Ref Range   Glucose-Capillary 116 (H) 70 - 99 mg/dL  Glucose, capillary   Collection Time: 08/30/22 12:01 AM  Result Value Ref Range   Glucose-Capillary 121 (H) 70 - 99 mg/dL   Comment 1 Notify RN   Glucose, capillary   Collection Time: 08/30/22  6:09 AM  Result Value Ref Range   Glucose-Capillary 80 70 - 99 mg/dL  Glucose, capillary   Collection Time: 08/30/22 11:29 AM  Result Value Ref Range   Glucose-Capillary 93 70 - 99 mg/dL   [place] MFM OB FOLLOW UP  Result Date: 08/03/2022 ----------------------------------------------------------------------  OBSTETRICS REPORT                       (Signed Final 08/03/2022 05:20 pm) ---------------------------------------------------------------------- Patient Info  ID #:       [id number]  D.O.B.:  [birth year] (36 yrs)  Name:       Selena Meyer                   Visit Date: 08/03/2022 12:03 pm ---------------------------------------------------------------------- Performed By  Attending:        Noralee Space MD        Ref. Address:     [address] [place]  Performed By:     Isac Sarna        [facility]  Referred By:      Everardo All ---------------------------------------------------------------------- Orders  #  Description                           Code        Ordered By  1  [place] MFM OB FOLLOW UP                   [id number]    Noralee Space ----------------------------------------------------------------------  #  Order #                     Accession #                Episode #  1  [id number]                   [id number]                 [id number] ---------------------------------------------------------------------- Indications  Cerebral ventriculomegaly                      O35.0XX0  Gestational diabetes in pregnancy,             O24.415  controlled by oral hypoglycemic drugs  Hypertension - Chronic/Pre-existing            O10.019  Advanced maternal age primigravida 46+,        O56.513  third trimester  (age 82)  Anemia  during pregnancy in third trimester     O99.013  Tobacco use complicating pregnancy, third      O99.333  trimester  Heart disease in mother affecting pregnancy    O99.413  in third trimester (SVT)  Medical complication of pregnancy (CKD)        O26.90  Encounter for other antenatal screening        Z36.2  follow-up  Abnormal cerebellum and boarderline            O35.8XX0  ventriculomegaly  LOW risk NIPS / Negative Horizon /  Negative AFP  Obesity complicating pregnancy, third          O99.213  trimester (PG BMI 30.62)  [redacted] weeks gestation of pregnancy                Z3A.28 ---------------------------------------------------------------------- Fetal Evaluation  Num Of Fetuses:         1  Fetal Heart Rate(bpm):  149  Cardiac Activity:       Observed  Presentation:           Cephalic  Placenta:               Anterior  P. Cord Insertion:      Previously visualized  Amniotic Fluid  AFI FV:      Within normal limits  AFI Sum(cm)     %Tile       Largest Pocket(cm)  23.26           95          8.25  RUQ(cm)       RLQ(cm)       LUQ(cm)        LLQ(cm)  5.79           8.25          6.27           2.95 ---------------------------------------------------------------------- Biometry  BPD:      69.8  mm     G. Age:  28w 0d         35  %    CI:        68.74   %    70 - 86                                                          FL/HC:      18.2   %    18.8 - 20.6  HC:       269   mm     G. Age:  29w 2d         56  %    HC/AC:      1.11        1.05 - 1.21  AC:      241.3  mm     G. Age:  28w 3d         51  %    FL/BPD:     70.2   %    71 - 87  FL:         49  mm     G. Age:  26w 3d        4.3  %    FL/AC:      20.3   %  20 - 24  LV:       11.5  mm  Est. FW:    1132  gm      2 lb 8 oz     26  % ---------------------------------------------------------------------- OB History  Gravidity:    1 ---------------------------------------------------------------------- Gestational Age  LMP:           28w 1d        Date:  01/18/22                  EDD:   10/25/22  U/S Today:     28w 0d                                        EDD:   10/26/22  Best:          28w 1d     Det. By:  LMP  (01/18/22)          EDD:   10/25/22 ---------------------------------------------------------------------- Anatomy  Cranium:               Appears normal         LVOT:                   Previously seen  Cavum:                 Appears normal         Aortic Arch:            Previously seen  Ventricles:            Ventriculomegaly,      Ductal Arch:            Previously seen                         12-14 mm  Choroid Plexus:        Dangling Choroid       Diaphragm:              Appears normal  Cerebellum:            Abnormal, see          Stomach:                Appears normal, left                         comments                                                                        sided  Posterior Fossa:       Previously seen        Abdomen:                Previously seen  Nuchal Fold:           Previously seen        Abdominal Wall:         Previously seen  Face:                  Orbits  and profile     Cord  Vessels:           Previously seen                         previously seen  Lips:                  Previously seen        Kidneys:                Appear normal  Palate:                Not well visualized    Bladder:                Appears normal  Thoracic:              Previously seen        Spine:                  Limited views                                                                        appear normal prev  Heart:                 Previously seen        Upper Extremities:      Previously seen  RVOT:                  Previously seen        Lower Extremities:      Previously seen  Other:  Fetus appears to be female prev. Heels, 5th digit, Open hands, NB          prev visualized. Technically difficult due to fetal position. VC, 3VV and          3VTV prev visualized. ---------------------------------------------------------------------- Impression  Patient returned for fetal growth assessment and evaluation  of intracranial anatomy.  Cerebral ventriculomegaly was seen at previous scan.  In  addition posterior fossa.  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  Infectious serology including toxoplasmosis and  cytomegalovirus are negative.  Patient had opted not to have amniocentesis.  Fetal echocardiography performed at [facility] was  reported as normal.  On today's ultrasound, amniotic fluid is normal and good fetal  activity seen.  Fetal growth is appropriate for gestational age.  Bilateral ventriculomegaly was seen.  Lateral ventricle (far-  transducer) measures 12 to 14 elevators.  Anterior horns are  dilated.  Third ventricle appears dilated and cavum vergae is  seen.  On sagittal view the vermis is seen and not rotated upward.  Cerebellum appears intact superiorly.  Inferiorly, a  communication appears to exist between fourth ventricle and  cisterna magna, which could be Blake's pouch cyst.  I explained the findings and recommended fetal brain MRI.  We discussed the possible causes of  ventriculomegaly  including fetal chromosomal anomalies or genetic  syndromes.  I discussed amniocentesis again and the  possible complication of preterm delivery (1 and 500  procedures).  Patient may consider amniocentesis.  She opted to have MRI.  She has gestational diabetes and takes metformin for  control.  She has chronic hypertension with chronic kidney disease  and takes labetalol and amlodipine.  Blood pressure today at  our office is 150/86 mmHg. ---------------------------------------------------------------------- Recommendations  -An appointment was made for her to return in 4 weeks for  fetal growth assessment.  -Weekly BPP from [redacted] weeks gestation till delivery.  -Will be requesting fetal brain MRI appointment with the [facility],  Kendell Bane. ----------------------------------------------------------------------                 Noralee Space, MD Electronically Signed Final Report   08/03/2022 05:20 pm ----------------------------------------------------------------------    Future Appointments  Date Time Provider Department Center  08/31/2022  4:10 PM Lennart Pall, MD [facility]  09/13/2022  2:45 PM [facility] US6 [facility]  09/20/2022 12:30 PM [facility] US6 [facility]  10/16/2022  8:20 AM Tobb, Kardie, DO [facility] None  11/23/2022 11:00 AM [facility] None  11/23/2022 11:15 AM Covington, Brand Males, PA-C [facility] None    Discharge Condition: Stable  Discharge disposition: 01-Home or Self Care       Discharge Instructions     Discharge activity:  No Restrictions   Complete by: As directed    Discharge diet:   Complete by: As directed    Low sodium pregnancy diet   Fetal Kick Count:  Lie on our left side for one hour after a meal, and count the number of times your baby kicks.  If it is less than 5 times, get up, move around and drink some juice.  Repeat the test 30 minutes later.  If it is still less than 5 kicks in an hour, notify your doctor.   Complete by:  As directed    No sexual activity restrictions   Complete by: As directed    Notify physician for a general feeling that "something is not right"   Complete by: As directed    Notify physician for increase or change in vaginal discharge   Complete by: As directed    Notify physician for intestinal cramps, with or without diarrhea, sometimes described as "gas pain"   Complete by: As directed    Notify physician for leaking of fluid   Complete by: As directed    Notify physician for low, dull backache, unrelieved by heat or Tylenol   Complete by: As directed    Notify physician for menstrual like cramps   Complete by: As directed    Notify physician for pelvic pressure   Complete by: As directed    Notify physician for uterine contractions.  These may be painless and feel like the uterus is tightening or the baby is  "balling up"   Complete by: As directed    Notify physician for vaginal bleeding   Complete by: As directed    PRETERM LABOR:  Includes any of the follwing symptoms that occur between 20 - [redacted] weeks gestation.  If these symptoms are not stopped, preterm labor can result in preterm delivery, placing your baby at risk   Complete by: As directed       Allergies as of 08/30/2022       Reactions   Ibuprofen Hives        Medication List     STOP taking these medications    amLODipine 10 MG tablet Commonly known as: NORVASC   Cranberry 500 MG Caps   Fish Oil 1000 MG Caps   guaiFENesin-codeine 100-10 MG/5ML syrup   magnesium oxide 400 MG tablet Commonly known as: MAG-OX  metFORMIN 500 MG tablet Commonly known as: GLUCOPHAGE       TAKE these medications    Accu-Chek Softclix Lancets lancets 100 each by Other route 4 (four) times daily. Use as instructed   acetaminophen 500 MG tablet Commonly known as: TYLENOL Take 1,000 mg by mouth every 6 (six) hours as needed for mild pain.   albuterol 108 (90 Base) MCG/ACT inhaler Commonly known as: VENTOLIN  HFA Inhale 2 puffs into the lungs every 6 (six) hours as needed for wheezing or shortness of breath.   aspirin EC 81 MG tablet Take 2 tablets (162 mg total) by mouth daily. Swallow whole.   ferrous sulfate 325 (65 FE) MG tablet Take by mouth.   FreeStyle Libre 3 Sensor Misc Place 1 sensor on the skin every 14 days. Use to check glucose continuously   glucose blood test strip Use as instructed   hydrALAZINE 25 MG tablet Commonly known as: APRESOLINE Take 1 tablet (25 mg total) by mouth every 6 (six) hours.   insulin glargine-yfgn 100 UNIT/ML Pen Commonly known as: SEMGLEE Inject 7 Units into the skin 2 (two) times daily.   Insulin Pen Needle 32G X 4 MM Misc Use as directed subcutaneously.   ipratropium 0.06 % nasal spray Commonly known as: ATROVENT Place 2 sprays into both nostrils 4 (four) times daily. As needed for runny nose / postnasal drip   labetalol 200 MG tablet Commonly known as: NORMODYNE Take 4 tablets (800 mg total) by mouth every 8 (eight) hours. What changed:  how much to take when to take this   metoCLOPramide 10 MG tablet Commonly known as: REGLAN Take 1 tablet (10 mg total) by mouth every 6 (six) hours as needed for nausea.   multivitamin-prenatal 27-0.8 MG Tabs tablet Take 1 tablet by mouth daily at 12 noon.   NIFEdipine 60 MG 24 hr tablet Commonly known as: ADALAT CC Take 1 tablet (60 mg total) by mouth daily.   NIFEdipine 30 MG 24 hr tablet Commonly known as: ADALAT CC Take 1 tablet (30 mg total) by mouth at bedtime.   NovoLOG FlexPen 100 UNIT/ML FlexPen Generic drug: insulin aspart Inject 5 Units into the skin with breakfast, with lunch, and with evening meal.   omeprazole 20 MG capsule Commonly known as: PRILOSEC Take 1 capsule (20 mg total) by mouth daily.   ondansetron 8 MG tablet Commonly known as: Zofran Take 1 tablet (8 mg total) by mouth every 8 (eight) hours as needed for nausea or vomiting.        Follow-up Information      Lennart Pall, MD .   Specialty: Obstetrics and Gynecology Contact information: [address] [place] [address] [phone]         [facility]. Go on 08/31/2022.   Specialty: Obstetrics and Gynecology Why: scheduled OB visit Contact information: [address] [place] [address] [phone]               Addendum:    This patient is considered a RED chart patient. She is considered in the highest level of acuity for pregnancy for our group. She also has chronic kidney disease. This patient received twice daily testing and close monitoring of her blood pressure. This level of care could not be accomplished as an outpatient. Pt was maxed out on 1 antihypertensive and almost maxed out on the second. If that happened she would have been delivered and  even though discharged, she will still likely be delivered early. As you can see she had at least 2 maternal fetal medicine consults to aid in her care.  Maternal fetal recommendations were used to guide the inpatient care and the time of discharge along with outpatient follow up care.The chronic kidney disease also clouds decision making regarding this patient, ie is this just a chronic issue or a worsening concerning for preeclampsia.  Total discharge time: 30 minutes   Signed: Warden Fillers M.D. 08/30/2022, 12:45 PM

## 2022-08-31 ENCOUNTER — Ambulatory Visit: Payer: BC Managed Care – PPO

## 2022-08-31 ENCOUNTER — Other Ambulatory Visit: Payer: BC Managed Care – PPO

## 2022-08-31 ENCOUNTER — Ambulatory Visit (INDEPENDENT_AMBULATORY_CARE_PROVIDER_SITE_OTHER): Payer: BC Managed Care – PPO | Admitting: Obstetrics and Gynecology

## 2022-08-31 ENCOUNTER — Telehealth: Payer: Self-pay

## 2022-08-31 VITALS — BP 116/73 | HR 90 | Wt 192.0 lb

## 2022-08-31 DIAGNOSIS — O359XX0 Maternal care for (suspected) fetal abnormality and damage, unspecified, not applicable or unspecified: Secondary | ICD-10-CM

## 2022-08-31 DIAGNOSIS — O26839 Pregnancy related renal disease, unspecified trimester: Secondary | ICD-10-CM

## 2022-08-31 DIAGNOSIS — D509 Iron deficiency anemia, unspecified: Secondary | ICD-10-CM

## 2022-08-31 DIAGNOSIS — O10919 Unspecified pre-existing hypertension complicating pregnancy, unspecified trimester: Secondary | ICD-10-CM

## 2022-08-31 DIAGNOSIS — I471 Supraventricular tachycardia, unspecified: Secondary | ICD-10-CM

## 2022-08-31 DIAGNOSIS — O0990 Supervision of high risk pregnancy, unspecified, unspecified trimester: Secondary | ICD-10-CM

## 2022-08-31 DIAGNOSIS — N051 Unspecified nephritic syndrome with focal and segmental glomerular lesions: Secondary | ICD-10-CM

## 2022-08-31 DIAGNOSIS — I517 Cardiomegaly: Secondary | ICD-10-CM

## 2022-08-31 DIAGNOSIS — Z3A32 32 weeks gestation of pregnancy: Secondary | ICD-10-CM

## 2022-08-31 DIAGNOSIS — O24414 Gestational diabetes mellitus in pregnancy, insulin controlled: Secondary | ICD-10-CM

## 2022-08-31 NOTE — Progress Notes (Signed)
PRENATAL VISIT NOTE  Subjective:  Selena Meyer is a 37 y.o. G1P0 at [redacted]w[redacted]d being seen today for ongoing prenatal care.  She is currently monitored for the following issues for this high-risk pregnancy and has FSGS (focal segmental glomerulosclerosis); SVT (supraventricular tachycardia); IDA (iron deficiency anemia); Supervision of high risk pregnancy, antepartum; Gestational diabetes mellitus (GDM), antepartum; AMA (advanced maternal age) primigravida 20+; Chronic hypertension during pregnancy, antepartum; Kidney disease in pregnancy, unspecified trimester; Morbid obesity (HCC); Moderate left ventricular hypertrophy; Known fetal anomaly, antepartum; Hypertensive crisis; and Hypertension affecting pregnancy on their problem list.  Patient reports  doing well overall. No new symptoms. Bps have been well controlled at home since leaving the hospital. Notices higher BG since leaving the hospital .  Contractions: Not present. Vag. Bleeding: None.  Movement: Present. Denies leaking of fluid.   The following portions of the patient's history were reviewed and updated as appropriate: allergies, current medications, past family history, past medical history, past social history, past surgical history and problem list.   Objective:   Vitals:   08/31/22 1557  BP: 116/73  Pulse: 90  Weight: 192 lb (87.1 kg)   Fetal Status: Fetal Heart Rate (bpm): 156   Movement: Present     General:  Alert, oriented and cooperative. Patient is in no acute distress.  Skin: Skin is warm and dry. No rash noted.   Cardiovascular: Normal heart rate noted  Respiratory: Normal respiratory effort, no problems with respiration noted  Abdomen: Soft, gravid, appropriate for gestational age.  Pain/Pressure: Absent      Assessment and Plan:  Pregnancy: G1P0 at [redacted]w[redacted]d 1. Supervision of high risk pregnancy, antepartum 2. [redacted] weeks gestation of pregnancy  3. Chronic hypertension during pregnancy, antepartum - Recent admission  for worsening cHTN vs SIPE w/ SF and her symptoms were ultimately attributed to worsening cHTN. Discharged yesterday. S/p Mag & BMZ 8/8-8/9 - Normotensive today (BP 116/73) on labetalol 800 TID, nifedipine 60/30 and hydralazine 25mg  q6h - Weekly labs, last 8/19 Cr 1.11 (baseline), otherwise normal - Growth [place] 8/21 @ 32/0 1761g (22%), AC 59%, cephalic, anterior, AFI 15.2 - 2x/wk antenatal testing - has weekly BPPs scheduled, will add weekly NSTs w/ her weekly appts - If further titration of meds are needed, plan for delivery at 34-35w. If BP remains stable/well controlled & labs normal, aiming for delivery at 36-37w  4. FSGS (focal segmental glomerulosclerosis) 5. Kidney disease in pregnancy, unspecified trimester Baseline Cr 0.98-1.16 & PCR 0.57-1.286 this pregnancy Monitoring w/ weekly labs as outlined above Needs to reschedule appt w/ nephrologist Dr. Glenna Fellows (was admitted on appt date)  6. Insulin controlled gestational diabetes mellitus (GDM) during pregnancy, antepartum Discharged home on semglee 7u BID & NVG 5u TID Antenatal testing outlined above Normal fetal echo Will f/u BG at next appt since pt was discharged yesterday  7. SVT (supraventricular tachycardia) 8. Moderate left ventricular hypertrophy Asymptomatic Next appt with Dr. Servando Salina 10/7  9. Known fetal anomaly, antepartum, single or unspecified fetus Mild ventriculomegaly, possible open neural tube defect - last [place] (7/25) c/f Blake's pouch cyst Toxo neg, CMV IgG positive but IgM negative (not c/w acute infxn). LR NIPS Declined amnio Fetal brain MRI scheduled 8/27  10. Iron deficiency anemia, unspecified iron deficiency anemia type Hgb 9.2 on 8/19 PO iron Will discuss iron studies & role for IV iron next appt  Please refer to After Visit Summary for other counseling recommendations.   Return in 4 days (on 09/04/2022) for ROB with NST &  labs.  Future Appointments  Date Time Provider Department Center  09/04/2022  3:30  PM [facility] NURSE [facility]  09/04/2022  4:10 PM Lennart Pall, MD [facility]  09/07/2022  3:30 PM [facility] NURSE [facility]  09/07/2022  3:45 PM [facility] US1 [facility]  09/12/2022  3:30 PM [facility] NURSE [facility]  09/12/2022  3:50 PM Sue Lush, FNP [facility]  09/13/2022  2:45 PM [facility] US6 [facility]  09/19/2022  3:30 PM Rasch, Harolyn Rutherford, NP [facility]  09/19/2022  3:30 PM [facility] NURSE [facility]  09/20/2022 12:30 PM [facility] US6 [facility]  10/16/2022  8:20 AM Tobb, Kardie, DO [facility] None  11/23/2022 11:00 AM [facility] None  11/23/2022 11:15 AM Covington, Brand Males, PA-C [facility] None    Lennart Pall, MD

## 2022-08-31 NOTE — Telephone Encounter (Signed)
Called pt to make appt per Dr. Mallory Shirk request.

## 2022-09-04 ENCOUNTER — Other Ambulatory Visit: Payer: Self-pay

## 2022-09-04 ENCOUNTER — Ambulatory Visit (INDEPENDENT_AMBULATORY_CARE_PROVIDER_SITE_OTHER): Payer: BC Managed Care – PPO | Admitting: Obstetrics and Gynecology

## 2022-09-04 ENCOUNTER — Encounter (HOSPITAL_COMMUNITY): Payer: Self-pay | Admitting: Obstetrics and Gynecology

## 2022-09-04 ENCOUNTER — Ambulatory Visit (INDEPENDENT_AMBULATORY_CARE_PROVIDER_SITE_OTHER): Payer: BC Managed Care – PPO | Admitting: *Deleted

## 2022-09-04 ENCOUNTER — Inpatient Hospital Stay (HOSPITAL_COMMUNITY)
Admission: AD | Admit: 2022-09-04 | Discharge: 2022-09-04 | Disposition: A | Discharge status: Home / Self Care | Payer: BC Managed Care – PPO | Attending: Obstetrics and Gynecology | Admitting: Obstetrics and Gynecology

## 2022-09-04 ENCOUNTER — Inpatient Hospital Stay (HOSPITAL_BASED_OUTPATIENT_CLINIC_OR_DEPARTMENT_OTHER): Payer: BC Managed Care – PPO

## 2022-09-04 VITALS — BP 119/72 | HR 76 | Temp 98.9°F | Wt 193.0 lb

## 2022-09-04 DIAGNOSIS — O0993 Supervision of high risk pregnancy, unspecified, third trimester: Secondary | ICD-10-CM

## 2022-09-04 DIAGNOSIS — O26833 Pregnancy related renal disease, third trimester: Secondary | ICD-10-CM | POA: Diagnosis not present

## 2022-09-04 DIAGNOSIS — O24415 Gestational diabetes mellitus in pregnancy, controlled by oral hypoglycemic drugs: Secondary | ICD-10-CM | POA: Diagnosis not present

## 2022-09-04 DIAGNOSIS — O10919 Unspecified pre-existing hypertension complicating pregnancy, unspecified trimester: Secondary | ICD-10-CM

## 2022-09-04 DIAGNOSIS — O113 Pre-existing hypertension with pre-eclampsia, third trimester: Secondary | ICD-10-CM

## 2022-09-04 DIAGNOSIS — F17210 Nicotine dependence, cigarettes, uncomplicated: Secondary | ICD-10-CM

## 2022-09-04 DIAGNOSIS — O10913 Unspecified pre-existing hypertension complicating pregnancy, third trimester: Secondary | ICD-10-CM

## 2022-09-04 DIAGNOSIS — O99413 Diseases of the circulatory system complicating pregnancy, third trimester: Secondary | ICD-10-CM

## 2022-09-04 DIAGNOSIS — N189 Chronic kidney disease, unspecified: Secondary | ICD-10-CM

## 2022-09-04 DIAGNOSIS — O3509X0 Maternal care for (suspected) other central nervous system malformation or damage in fetus, not applicable or unspecified: Secondary | ICD-10-CM

## 2022-09-04 DIAGNOSIS — O99891 Other specified diseases and conditions complicating pregnancy: Secondary | ICD-10-CM

## 2022-09-04 DIAGNOSIS — I471 Supraventricular tachycardia, unspecified: Secondary | ICD-10-CM

## 2022-09-04 DIAGNOSIS — Z3A32 32 weeks gestation of pregnancy: Secondary | ICD-10-CM | POA: Diagnosis not present

## 2022-09-04 DIAGNOSIS — O99333 Smoking (tobacco) complicating pregnancy, third trimester: Secondary | ICD-10-CM

## 2022-09-04 DIAGNOSIS — O10013 Pre-existing essential hypertension complicating pregnancy, third trimester: Secondary | ICD-10-CM | POA: Diagnosis not present

## 2022-09-04 DIAGNOSIS — Z3689 Encounter for other specified antenatal screening: Secondary | ICD-10-CM | POA: Diagnosis not present

## 2022-09-04 DIAGNOSIS — O0990 Supervision of high risk pregnancy, unspecified, unspecified trimester: Secondary | ICD-10-CM

## 2022-09-04 DIAGNOSIS — O26839 Pregnancy related renal disease, unspecified trimester: Secondary | ICD-10-CM

## 2022-09-04 DIAGNOSIS — O368130 Decreased fetal movements, third trimester, not applicable or unspecified: Secondary | ICD-10-CM | POA: Insufficient documentation

## 2022-09-04 DIAGNOSIS — O09513 Supervision of elderly primigravida, third trimester: Secondary | ICD-10-CM

## 2022-09-04 LAB — CBC
HCT: 29.3 % — ABNORMAL LOW (ref 36.0–46.0)
Hemoglobin: 9.9 g/dL — ABNORMAL LOW (ref 12.0–15.0)
MCH: 30 pg (ref 26.0–34.0)
MCHC: 33.8 g/dL (ref 30.0–36.0)
MCV: 88.8 fL (ref 80.0–100.0)
Platelets: 319 10*3/uL (ref 150–400)
RBC: 3.3 MIL/uL — ABNORMAL LOW (ref 3.87–5.11)
RDW: 13.6 % (ref 11.5–15.5)
WBC: 10.5 10*3/uL (ref 4.0–10.5)
nRBC: 0 % (ref 0.0–0.2)

## 2022-09-04 LAB — COMPREHENSIVE METABOLIC PANEL
ALT: 18 U/L (ref 0–44)
AST: 21 U/L (ref 15–41)
Albumin: 2.4 g/dL — ABNORMAL LOW (ref 3.5–5.0)
Alkaline Phosphatase: 59 U/L (ref 38–126)
Anion gap: 11 (ref 5–15)
BUN: 8 mg/dL (ref 6–20)
CO2: 19 mmol/L — ABNORMAL LOW (ref 22–32)
Calcium: 8.7 mg/dL — ABNORMAL LOW (ref 8.9–10.3)
Chloride: 104 mmol/L (ref 98–111)
Creatinine, Ser: 1.13 mg/dL — ABNORMAL HIGH (ref 0.44–1.00)
GFR, Estimated: >60 mL/min (ref >60)
Glucose, Bld: 82 mg/dL (ref 70–99)
Potassium: 3.7 mmol/L (ref 3.5–5.1)
Sodium: 134 mmol/L — ABNORMAL LOW (ref 135–145)
Total Bilirubin: 0.5 mg/dL (ref 0.3–1.2)
Total Protein: 6 g/dL — ABNORMAL LOW (ref 6.5–8.1)

## 2022-09-04 LAB — PROTEIN / CREATININE RATIO, URINE
Creatinine, Urine: 184 mg/dL
Protein Creatinine Ratio: 0.39 mg/mg{Cre} — ABNORMAL HIGH (ref 0.00–0.15)
Total Protein, Urine: 72 mg/dL

## 2022-09-04 MED ORDER — NIFEDIPINE ER OSMOTIC RELEASE 30 MG PO TB24
30.0000 mg | ORAL_TABLET | Freq: Every day | ORAL | Status: DC
  Administered 2022-09-04: 30 mg via ORAL
  Filled 2022-09-04: qty 1

## 2022-09-04 MED ORDER — METOCLOPRAMIDE HCL 10 MG PO TABS: 10.0000 mg | ORAL_TABLET | Freq: Four times a day (QID) | ORAL | 3 refills | Status: DC | PRN

## 2022-09-04 MED ORDER — HYDRALAZINE HCL 25 MG PO TABS
25.0000 mg | ORAL_TABLET | Freq: Four times a day (QID) | ORAL | Status: DC
  Administered 2022-09-04: 25 mg via ORAL
  Filled 2022-09-04 (×3): qty 1

## 2022-09-04 MED ORDER — NIFEDIPINE 10 MG PO CAPS: 30.0000 mg | ORAL_CAPSULE | Freq: Every day | ORAL | Status: DC

## 2022-09-04 MED ORDER — ONDANSETRON 4 MG PO TBDP
4.0000 mg | ORAL_TABLET | Freq: Once | ORAL | Status: AC
  Administered 2022-09-04: 4 mg via ORAL
  Filled 2022-09-04: qty 1

## 2022-09-04 MED ORDER — LABETALOL HCL 200 MG PO TABS
800.0000 mg | ORAL_TABLET | Freq: Three times a day (TID) | ORAL | Status: DC
  Administered 2022-09-04: 800 mg via ORAL
  Filled 2022-09-04: qty 4
  Filled 2022-09-04: qty 8

## 2022-09-04 NOTE — MAU Note (Signed)
Selena Meyer is a 37 y.o. at [redacted]w[redacted]d here in MAU reporting: had NST @ office visit and baby wasn't "being cooperative" so needed to have furthe FM and a ultrasound.  Denies VB or LOF.  Endorses +FM. LMP: NA Onset of complaint: today Pain score: 0 Vitals:   09/04/22 1802  BP: (!) 143/82  Pulse: 84  Resp: 18  Temp: 98.7 F (37.1 C)  SpO2: 97%     FHT:135 bpm Lab orders placed from triage:   None

## 2022-09-04 NOTE — Progress Notes (Signed)
PRENATAL VISIT NOTE  Subjective:  Selena Meyer is a 37 y.o. G1P0 at [redacted]w[redacted]d being seen today for ongoing prenatal care.  She is currently monitored for the following issues for this high-risk pregnancy and has FSGS (focal segmental glomerulosclerosis); SVT (supraventricular tachycardia); IDA (iron deficiency anemia); Supervision of high risk pregnancy, antepartum; Gestational diabetes mellitus (GDM), antepartum; AMA (advanced maternal age) primigravida 76+; Chronic hypertension during pregnancy, antepartum; Kidney disease in pregnancy, unspecified trimester; Morbid obesity (HCC); Moderate left ventricular hypertrophy; Known fetal anomaly, antepartum; Hypertensive crisis; and Hypertension affecting pregnancy on their problem list.  Patient reports motion sickness and nausea/vomiting that started in the car. Is vomiting after taking her hydralazine. Takes it with food, but has not tried taking it as the same time as her reglan.  Contractions: Not present. Vag. Bleeding: None.  Movement: Present. Denies leaking of fluid.   The following portions of the patient's history were reviewed and updated as appropriate: allergies, current medications, past family history, past medical history, past social history, past surgical history and problem list.   Objective:   Vitals:   09/04/22 1545  BP: 119/72  Pulse: 76  Temp: 98.9 F (37.2 C)  Weight: 193 lb (87.5 kg)   Fetal Status:     Movement: Present    NST 150bpm, moderate, 10x10, 1 variabel deceleration   General:  Alert, oriented and cooperative. Patient is in no acute distress.  Skin: Skin is warm and dry. No rash noted.   Cardiovascular: Normal heart rate noted  Respiratory: Normal respiratory effort, no problems with respiration noted  Abdomen: Soft, gravid, appropriate for gestational age.  Pain/Pressure: Absent      Assessment and Plan:  Pregnancy: G1P0 at [redacted]w[redacted]d 1. Supervision of high risk pregnancy, antepartum 2. [redacted] weeks gestation of  pregnancy NST nonreactive, pt will go to MAU for BPP. MAU providers notified  3. Chronic hypertension during pregnancy, antepartum - S/p Mag & BMZ 8/8-8/9 after admission for worsening cHTN - Normotensive today on labetalol 800 TID, nifedipine 60/30 and hydralazine 25mg  q6h - Weekly labs, last 8/19 Cr 1.11 (baseline), otherwise normal - Growth [place] 8/21 @ 32/0 1761g (22%), AC 59%, cephalic, anterior, AFI 15.2 - 2x/wk antenatal testing - weekly NSTs at [facility] w/ her ROBs and weekly BPPs w/ MFM - If further titration of meds are needed, plan for delivery at 34-35w. If BP remains stable/well controlled & labs normal, aiming for delivery at 36-37w  4. FSGS (focal segmental glomerulosclerosis) 5. Kidney disease in pregnancy, unspecified trimester Baseline Cr 0.98-1.16 & PCR 0.57-1.286 this pregnancy Monitoring w/ weekly labs as outlined above Needs to reschedule appt w/ nephrologist Dr. Glenna Fellows (was admitted on appt date)  6. Insulin controlled gestational diabetes mellitus (GDM) during pregnancy, antepartum Does not have log for review today. Reports normal fasting & postprandial after every meal except this AM when she had pancakes Continue semglee 7u BID & NVG 5u TID Antenatal testing outlined above Normal fetal echo  7. SVT (supraventricular tachycardia) 8. Moderate left ventricular hypertrophy Asymptomatic Next appt with Dr. Servando Salina 8/27  9. Known fetal anomaly, antepartum, single or unspecified fetus Mild ventriculomegaly, possible open neural tube defect - last [place] (7/25) c/f Blake's pouch cyst Toxo neg, CMV IgG positive but IgM negative (not c/w acute infxn). LR NIPS Declined amnio Fetal brain MRI scheduled 8/27 (tomorrow)  10. Iron deficiency anemia, unspecified iron deficiency anemia type Hgb 9.2 on 8/19 PO iron Will discuss iron studies & role for IV iron after her labs for  this week  Please refer to After Visit Summary for other counseling recommendations.   Future  Appointments  Date Time Provider Department Center  09/05/2022  2:00 PM Thomasene Ripple, DO [facility] None  09/07/2022  3:30 PM [facility] NURSE [facility]  09/07/2022  3:45 PM [facility] US1 [facility]  09/12/2022  3:30 PM [facility] NURSE [facility]  09/12/2022  3:50 PM Sue Lush, FNP [facility]  09/13/2022  2:45 PM [facility] US6 [facility]  09/19/2022  3:30 PM Rasch, Harolyn Rutherford, NP [facility]  09/19/2022  3:30 PM [facility] NURSE [facility]  09/20/2022 12:30 PM [facility] US6 [facility]  09/28/2022  3:30 PM [facility] NURSE [facility]  09/28/2022  3:30 PM Milas Hock, MD [facility]  10/16/2022  8:20 AM Tobb, Lavona Mound, DO [facility] None  11/23/2022 11:00 AM [facility] None  11/23/2022 11:15 AM Covington, Brand Males, PA-C [facility] None    Lennart Pall, MD

## 2022-09-04 NOTE — Progress Notes (Signed)
NST-Non reassuring and sent for labs and MAU for BPP

## 2022-09-04 NOTE — MAU Provider Note (Signed)
Non-reactive NST     [name] is a 36 y.o. G1P0 at [redacted]w[redacted]d with PMHx of CKD/FSGS, cHTN w/ recent exacerbation, GDM on insulin who presents to MAU today with complaint of non-reassuring NST in clinic today (140s-150s, moderate variability, 10x10s, 1 large variable). Patient was on for about an hour.  Sent here for BPP.  Noted to be normotensive in office however mild range on admission.    Receives care at [place]. Prenatal records reviewed.  Pertinent items noted in HPI and remainder of comprehensive ROS otherwise negative.   O BP (!) 148/87   Pulse 86   Temp 98.7 F (37.1 C) (Oral)   Resp 18   Ht 5\' 5"  (1.651 m)   Wt 87.8 kg   LMP 01/18/2022   SpO2 98%   BMI 32.20 kg/m  Physical Exam Constitutional:      General: She is not in acute distress.    Appearance: Normal appearance. She is normal weight. She is not ill-appearing.  HENT:     Head: Normocephalic and atraumatic.     Nose: Nose normal.     Mouth/Throat:     Mouth: Mucous membranes are moist.     Pharynx: Oropharynx is clear.  Eyes:     Extraocular Movements: Extraocular movements intact.     Conjunctiva/sclera: Conjunctivae normal.  Cardiovascular:     Rate and Rhythm: Normal rate and regular rhythm.  Pulmonary:     Effort: Pulmonary effort is normal. No respiratory distress.     Breath sounds: Normal breath sounds.  Abdominal:     General: There is no distension.  Musculoskeletal:     Cervical back: Normal range of motion.  Skin:    General: Skin is warm and dry.  Neurological:     General: No focal deficit present.     Mental Status: She is alert and oriented to person, place, and time. Mental status is at baseline.     Motor: No weakness.  Psychiatric:        Mood and Affect: Mood normal.        Behavior: Behavior normal.        Thought Content: Thought content normal.        Judgment: Judgment normal.      MDM: High MAU Course:  Patient presents from clinic for non-reactive NST.   Here NST baseline 145bpm, moderate variability, 15x15 accels, no decel, no CTX Cat one with BPP 8/8.  BP was continued to be watched given mild range on presentation.  Had several mild and moderate range BP and one severe with SBP 160.  Continues to be asymptomatic. Labs drawn but not STAT at clinic and given new BP changes will redraw.    A&P: #CKD/FSGS #cHTN Signed out patient to on coming provider pending BP rechecks after nightly medications as well as return of PreE labs.  D/c if stable labs and BP improve vs admit for BP control as prior admission.    Hessie Dibble, MD 09/04/2022 8:42 PM

## 2022-09-05 ENCOUNTER — Ambulatory Visit: Payer: BC Managed Care – PPO | Attending: Cardiology | Admitting: Cardiology

## 2022-09-05 ENCOUNTER — Ambulatory Visit: Payer: BC Managed Care – PPO | Admitting: Cardiology

## 2022-09-05 ENCOUNTER — Encounter: Payer: Self-pay | Admitting: Cardiology

## 2022-09-05 VITALS — BP 132/78 | HR 95 | Ht 65.0 in | Wt 193.5 lb

## 2022-09-05 DIAGNOSIS — O09519 Supervision of elderly primigravida, unspecified trimester: Secondary | ICD-10-CM

## 2022-09-05 DIAGNOSIS — N182 Chronic kidney disease, stage 2 (mild): Secondary | ICD-10-CM

## 2022-09-05 DIAGNOSIS — Z3A33 33 weeks gestation of pregnancy: Secondary | ICD-10-CM | POA: Diagnosis not present

## 2022-09-05 DIAGNOSIS — O10919 Unspecified pre-existing hypertension complicating pregnancy, unspecified trimester: Secondary | ICD-10-CM | POA: Diagnosis not present

## 2022-09-05 DIAGNOSIS — O26873 Cervical shortening, third trimester: Secondary | ICD-10-CM | POA: Diagnosis not present

## 2022-09-05 DIAGNOSIS — Z794 Long term (current) use of insulin: Secondary | ICD-10-CM

## 2022-09-05 DIAGNOSIS — Q043 Other reduction deformities of brain: Secondary | ICD-10-CM | POA: Diagnosis not present

## 2022-09-05 DIAGNOSIS — E119 Type 2 diabetes mellitus without complications: Secondary | ICD-10-CM

## 2022-09-05 DIAGNOSIS — O3509X0 Maternal care for (suspected) other central nervous system malformation or damage in fetus, not applicable or unspecified: Secondary | ICD-10-CM | POA: Diagnosis not present

## 2022-09-05 LAB — CBC
Hematocrit: 31.3 % — ABNORMAL LOW (ref 34.0–46.6)
Hemoglobin: 10.9 g/dL — ABNORMAL LOW (ref 11.1–15.9)
MCH: 31.2 pg (ref 26.6–33.0)
MCHC: 34.8 g/dL (ref 31.5–35.7)
MCV: 90 fL (ref 79–97)
Platelets: 361 10*3/uL (ref 150–450)
RBC: 3.49 x10E6/uL — ABNORMAL LOW (ref 3.77–5.28)
RDW: 13.1 % (ref 11.7–15.4)
WBC: 10.8 10*3/uL (ref 3.4–10.8)

## 2022-09-05 LAB — PROTEIN / CREATININE RATIO, URINE
Creatinine, Urine: 176 mg/dL
Protein, Ur: 101.3 mg/dL
Protein/Creat Ratio: 576 mg/g{creat} — ABNORMAL HIGH (ref 0–200)

## 2022-09-05 LAB — COMPREHENSIVE METABOLIC PANEL
ALT: 19 IU/L (ref 0–32)
AST: 20 IU/L (ref 0–40)
Albumin: 3.4 g/dL — ABNORMAL LOW (ref 3.9–4.9)
Alkaline Phosphatase: 79 IU/L (ref 44–121)
BUN/Creatinine Ratio: 10 (ref 9–23)
BUN: 10 mg/dL (ref 6–20)
Bilirubin Total: <0.2 mg/dL (ref 0.0–1.2)
CO2: 18 mmol/L — ABNORMAL LOW (ref 20–29)
Calcium: 9.4 mg/dL (ref 8.7–10.2)
Chloride: 103 mmol/L (ref 96–106)
Creatinine, Ser: 1.03 mg/dL — ABNORMAL HIGH (ref 0.57–1.00)
Globulin, Total: 2.9 g/dL (ref 1.5–4.5)
Glucose: 102 mg/dL — ABNORMAL HIGH (ref 70–99)
Potassium: 3.9 mmol/L (ref 3.5–5.2)
Sodium: 137 mmol/L (ref 134–144)
Total Protein: 6.3 g/dL (ref 6.0–8.5)
eGFR: 71 mL/min/{1.73_m2} (ref >59)

## 2022-09-05 MED ORDER — NIFEDIPINE ER 60 MG PO TB24: 60.0000 mg | ORAL_TABLET | Freq: Two times a day (BID) | ORAL | 3 refills | Status: DC

## 2022-09-05 NOTE — Patient Instructions (Addendum)
Medication Instructions:  Nifedipine 60 mg twice a day *If you need a refill on your cardiac medications before your next appointment, please call your pharmacy*  Follow-Up: At [facility], you and your health needs are our priority.  As part of our continuing mission to provide you with exceptional heart care, we have created designated Provider Care Teams.  These Care Teams include your primary Cardiologist (physician) and Advanced Practice Providers (APPs -  Physician Assistants and Nurse Practitioners) who all work together to provide you with the care you need, when you need it.  We recommend signing up for the patient portal called "MyChart".  Sign up information is provided on this After Visit Summary.  MyChart is used to connect with patients for Virtual Visits (Telemedicine).  Patients are able to view lab/test results, encounter notes, upcoming appointments, etc.  Non-urgent messages can be sent to your provider as well.   To learn more about what you can do with MyChart, go to [email or web address].    Your next appointment:   One week with Laural Golden, [facility]- Cardio OB  2 weeks with Dr Servando Salina

## 2022-09-07 ENCOUNTER — Ambulatory Visit: Payer: BC Managed Care – PPO

## 2022-09-07 ENCOUNTER — Other Ambulatory Visit: Payer: Self-pay | Admitting: Obstetrics and Gynecology

## 2022-09-07 ENCOUNTER — Ambulatory Visit: Payer: BC Managed Care – PPO | Admitting: *Deleted

## 2022-09-07 ENCOUNTER — Ambulatory Visit: Payer: BC Managed Care – PPO | Attending: Obstetrics and Gynecology

## 2022-09-07 VITALS — BP 121/55 | HR 88

## 2022-09-07 DIAGNOSIS — O10919 Unspecified pre-existing hypertension complicating pregnancy, unspecified trimester: Secondary | ICD-10-CM | POA: Insufficient documentation

## 2022-09-07 DIAGNOSIS — O24415 Gestational diabetes mellitus in pregnancy, controlled by oral hypoglycemic drugs: Secondary | ICD-10-CM

## 2022-09-07 DIAGNOSIS — O09513 Supervision of elderly primigravida, third trimester: Secondary | ICD-10-CM | POA: Diagnosis not present

## 2022-09-07 DIAGNOSIS — I471 Supraventricular tachycardia, unspecified: Secondary | ICD-10-CM

## 2022-09-07 DIAGNOSIS — D649 Anemia, unspecified: Secondary | ICD-10-CM

## 2022-09-07 DIAGNOSIS — Z3A33 33 weeks gestation of pregnancy: Secondary | ICD-10-CM

## 2022-09-07 DIAGNOSIS — O113 Pre-existing hypertension with pre-eclampsia, third trimester: Secondary | ICD-10-CM | POA: Diagnosis not present

## 2022-09-07 DIAGNOSIS — O24414 Gestational diabetes mellitus in pregnancy, insulin controlled: Secondary | ICD-10-CM

## 2022-09-07 DIAGNOSIS — O0990 Supervision of high risk pregnancy, unspecified, unspecified trimester: Secondary | ICD-10-CM

## 2022-09-07 DIAGNOSIS — O26839 Pregnancy related renal disease, unspecified trimester: Secondary | ICD-10-CM | POA: Diagnosis not present

## 2022-09-07 DIAGNOSIS — E669 Obesity, unspecified: Secondary | ICD-10-CM

## 2022-09-07 DIAGNOSIS — O99413 Diseases of the circulatory system complicating pregnancy, third trimester: Secondary | ICD-10-CM

## 2022-09-07 DIAGNOSIS — O99013 Anemia complicating pregnancy, third trimester: Secondary | ICD-10-CM

## 2022-09-07 DIAGNOSIS — O3509X0 Maternal care for (suspected) other central nervous system malformation or damage in fetus, not applicable or unspecified: Secondary | ICD-10-CM

## 2022-09-07 DIAGNOSIS — O99213 Obesity complicating pregnancy, third trimester: Secondary | ICD-10-CM

## 2022-09-07 DIAGNOSIS — O10013 Pre-existing essential hypertension complicating pregnancy, third trimester: Secondary | ICD-10-CM

## 2022-09-08 ENCOUNTER — Other Ambulatory Visit: Payer: Self-pay | Admitting: *Deleted

## 2022-09-08 DIAGNOSIS — O3509X0 Maternal care for (suspected) other central nervous system malformation or damage in fetus, not applicable or unspecified: Secondary | ICD-10-CM

## 2022-09-09 NOTE — Progress Notes (Signed)
Cardio-Obstetrics Clinic  New Evaluation  Date:  09/09/2022   ID:  Selena Rakes, DO B [birth year], MRN [id number]  PCP:  [name], NP   [facility] Providers Cardiologist:  Thomasene Ripple, DO  Electrophysiologist:  None       Referring MD: [name], NP   Chief Complaint: " I am ok"  History of Present Illness:    Selena Meyer is a 37 y.o. female [G1P0] who is being seen today in follow up for chronic hypertension in pregnancy.  Medical history includes SVT, Hypertension, hypertriglyceridemia, FSGS, gestational diabetes   Prior CV Studies Reviewed: The following studies were reviewed today:  TTE 2019 Study Conclusions   - Left ventricle: The cavity size was normal. Wall thickness was    increased in a pattern of moderate LVH. Systolic function was    normal. Wall motion was normal; there were no regional wall    motion abnormalities. Doppler parameters are consistent with    abnormal left ventricular relaxation (grade 1 diastolic    dysfunction).   -------------------------------------------------------------------  Study data:  No prior study was available for comparison.  Study  status:  Routine.  Procedure:  Transthoracic echocardiography.  Image quality was adequate.  Study completion:  There were no  complications.          Transthoracic echocardiography.  M-mode,  complete 2D, spectral Doppler, and color Doppler.  Birthdate:  Patient birthdate: 06/19/1985.  Age:  Patient is 37 yr old.  Sex:  Gender: female.    BMI: 32.7 kg/m^2.  Blood pressure:     140/101  Patient status:  Outpatient.  Study date:  Study date: 08/01/2017.  Study time: 10:14 AM.  Location:  Echo laboratory.   -------------------------------------------------------------------   -------------------------------------------------------------------  Left ventricle:  The cavity size was normal. Wall thickness was  increased in a pattern of moderate LVH. Systolic function was   normal. Wall motion was normal; there were no regional wall motion  abnormalities. Doppler parameters are consistent with abnormal left  ventricular relaxation (grade 1 diastolic dysfunction).   -------------------------------------------------------------------  Aortic valve:   Structurally normal valve. Trileaflet; normal  thickness leaflets. Cusp separation was normal. Mobility was not  restricted.  Doppler:  Transvalvular velocity was within the normal  range. There was no stenosis. There was no regurgitation.   -------------------------------------------------------------------  Aorta: The aorta was normal, not dilated, and non-diseased. Aortic  root: The aortic root was normal in size.   -------------------------------------------------------------------  Mitral valve:   Structurally normal valve.   Leaflet separation was  normal. Mobility was not restricted.  Doppler:  Transvalvular  velocity was within the normal range. There was no evidence for  stenosis. There was no regurgitation.    Peak gradient (D): 2 mm  Hg.   -------------------------------------------------------------------  Left atrium:  The atrium was normal in size.   -------------------------------------------------------------------  Atrial septum:  The septum was normal.   -------------------------------------------------------------------  Pulmonary veins:  Common pulmonary vein:  The Doppler velocity and flow profile were  normal.   -------------------------------------------------------------------  Right ventricle:  The cavity size was normal. Wall thickness was  normal. Systolic function was normal.   -------------------------------------------------------------------  Pulmonic valve:    Structurally normal valve.   Cusp separation was  normal.  Doppler:  Transvalvular velocity was within the normal  range. There was no evidence for stenosis. There was no  regurgitation.    -------------------------------------------------------------------  Tricuspid valve:   Structurally normal valve.   Leaflet separation  was normal.  Doppler:  Transvalvular velocity was within the normal  range. There was no regurgitation.   -------------------------------------------------------------------  Pulmonary artery:   The main pulmonary artery was normal-sized.  Systolic pressure was within the normal range.   -------------------------------------------------------------------  Right atrium:  The atrium was normal in size.   -------------------------------------------------------------------  Pericardium: The pericardium was normal in appearance. There was  no pericardial effusion.   -------------------------------------------------------------------  Systemic veins:  Inferior vena cava: The vessel was normal in size. The  respirophasic diameter changes were in the normal range (>= 50%),  consistent with normal central venous pressure.   -------------------------------------------------------------------  Post procedure conclusions  Ascending Aorta:   - The aorta was normal, not dilated, and non-diseased.   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    Gestational diabetes    Hypertension    Hypokalemia 11/02/2020   Irregular menstrual cycle    Irregular periods/menstrual cycles 05/03/2017   Will hold off on Provera or other fertility stuff for now, pt advised pregnancy is not recommended until BP under better control at least, plus other workup pen   Polycystic ovaries 11/09/2015   Proteinuria 02/28/2013   Stage 2 chronic kidney disease 04/27/2020    SVT (supraventricular tachycardia) 11/01/2020   Systolic murmur 07/16/2017   Tobacco abuse 02/28/2013   Uncontrolled hypertension 05/10/2017    Past Surgical History:  Procedure Laterality Date   NO PAST SURGERIES        OB History     Gravida  1   Para      Term      Preterm      AB      Living         SAB      IAB      Ectopic      Multiple      Live Births                  Current Medications: Current Meds  Medication Sig   Accu-Chek Softclix Lancets lancets 100 each by Other route 4 (four) times daily. Use as instructed   acetaminophen (TYLENOL) 500 MG tablet Take 1,000 mg by mouth every 6 (six) hours as needed for mild pain.    albuterol (VENTOLIN HFA) 108 (90 Base) MCG/ACT inhaler Inhale 2 puffs into the lungs every 6 (six) hours as needed for wheezing or shortness of breath.   aspirin EC 81 MG tablet Take 2 tablets (162 mg total) by mouth daily. Swallow whole.   Continuous Glucose Sensor (FREESTYLE LIBRE 3 SENSOR) MISC Place 1 sensor on the skin every 14 days. Use to check glucose continuously   ferrous sulfate 325 (65 FE) MG tablet Take by mouth.   glucose blood test strip Use as instructed   hydrALAZINE (APRESOLINE) 25 MG tablet Take 1 tablet (25 mg total) by mouth every 6 (six) hours.   insulin aspart (NOVOLOG FLEXPEN) 100 UNIT/ML FlexPen Inject 5 Units into the skin with breakfast, with lunch, and with evening meal.   insulin glargine-yfgn (SEMGLEE) 100 UNIT/ML Pen Inject 7 Units into the skin 2 (two) times daily.   Insulin Pen Needle 32G X 4 MM MISC Use as directed subcutaneously.   ipratropium (ATROVENT) 0.06 %  nasal spray Place 2 sprays into both nostrils 4 (four) times daily. As needed for runny nose / postnasal drip   labetalol (NORMODYNE) 200 MG tablet Take 4 tablets (800 mg total) by mouth every 8 (eight) hours.   MAGNESIUM-OXIDE 400 (240 Mg) MG tablet Take 0.5 tablets by mouth daily.   metoCLOPramide (REGLAN) 10 MG tablet Take 1 tablet  (10 mg total) by mouth every 6 (six) hours as needed for nausea.   omeprazole (PRILOSEC) 20 MG capsule Take 1 capsule (20 mg total) by mouth daily.   ondansetron (ZOFRAN) 8 MG tablet Take 1 tablet (8 mg total) by mouth every 8 (eight) hours as needed for nausea or vomiting.   Prenatal Vit-Fe Fumarate-FA (MULTIVITAMIN-PRENATAL) 27-0.8 MG TABS tablet Take 1 tablet by mouth daily at 12 noon.   [DISCONTINUED] NIFEdipine (ADALAT CC) 30 MG 24 hr tablet Take 1 tablet (30 mg total) by mouth at bedtime.   [DISCONTINUED] NIFEdipine (ADALAT CC) 60 MG 24 hr tablet Take 1 tablet (60 mg total) by mouth daily.     Allergies:   Ibuprofen   Social History   Socioeconomic History   Marital status: Married    Spouse name: Ivar Drape   Number of children: 0   Years of education: Not on file   Highest education level: Not on file  Occupational History    Employer: UNEMPLOYED  Tobacco Use   Smoking status: Former    Current packs/day: 0.00    Average packs/day: 0.3 packs/day for 5.0 years (1.3 ttl pk-yrs)    Types: Cigarettes    Start date: 05/26/2016    Quit date: 05/26/2021    Years since quitting: 1.3   Smokeless tobacco: Never   Tobacco comments:    Quit smoking 05-18-2023after her mom passed away  Vaping Use   Vaping status: Never Used  Substance and Sexual Activity   Alcohol use: Not Currently    Comment: "occasional use; social drinker"   Drug use: No   Sexual activity: Yes    Partners: Male    Birth control/protection: None    Comment: Pregnant  Other Topics Concern   Not on file  Social History Narrative   Not on file   Social Determinants of Health   Financial Resource Strain: Low Risk  (05/08/2022)   Received from [facility], [facility]   Overall Financial Resource Strain (CARDIA)    Difficulty of Paying Living Expenses: Not very hard  Food Insecurity: No Food Insecurity (08/18/2022)   Hunger Vital Sign    Worried About Running Out of Food in the Last Year: Never true    Ran Out  of Food in the Last Year: Never true  Transportation Needs: No Transportation Needs (08/18/2022)   PRAPARE - Administrator, Civil Service (Medical): No    Lack of Transportation (Non-Medical): No  Physical Activity: Unknown (11/03/2020)   Received from [facility], [facility]   Exercise Vital Sign    Days of Exercise per Week: 5 days    Minutes of Exercise per Session: Patient declined  Stress: Stress Concern Present (11/03/2020)   Received from [facility], [facility] of [facility] - Occupational Stress Questionnaire    Feeling of Stress : To some extent  Social Connections: Unknown (05/10/2021)   Received from [facility], [facility]   Social Network    Social Network: Not on file      Family History  Problem Relation Age of Onset  Diabetes Mother    High blood pressure Mother    Renal Disease Mother    Diabetes Sister    Cancer Maternal Grandmother    Diabetes Maternal Grandmother    High blood pressure Maternal Grandmother    Cancer Maternal Aunt        breast   Other Father       ROS:   Please see the history of present illness.     All other systems reviewed and are negative.   Labs/EKG Reviewed:    EKG:   EKG is was not ordered today.    Recent Labs: 08/03/2022: TSH 1.010 08/18/2022: Magnesium 6.1 09/04/2022: ALT 18; BUN 8; Creatinine, Ser 1.13; Hemoglobin 9.9; Platelets 319; Potassium 3.7; Sodium 134   Recent Lipid Panel Lab Results  Component Value Date/Time   CHOL 240 (H) 11/15/2021 12:00 AM   TRIG 492 (H) 11/15/2021 12:00 AM   HDL 40 (L) 11/15/2021 12:00 AM   CHOLHDL 6.0 (H) 11/15/2021 12:00 AM   LDLCALC  11/15/2021 12:00 AM     Comment:     . LDL cholesterol not calculated. Triglyceride levels greater than 400 mg/dL invalidate calculated LDL results. . Reference range: <100 . Desirable range <100 mg/dL for primary prevention;   <70 mg/dL for patients with CHD or diabetic patients  with >  or = 2 CHD risk factors. [name] LDL-C is now calculated using the Martin-Hopkins  calculation, which is a validated novel method providing  better accuracy than the Friedewald equation in the  estimation of LDL-C.  Horald Pollen et al. Lenox Ahr. 5366;440(34): 2061-2068  (http://education.[employer].com/faq/FAQ164)     Physical Exam:    VS:  BP 132/78   Pulse 95   Ht 5\' 5"  (1.651 m)   Wt 193 lb 8 oz (87.8 kg)   LMP 01/18/2022   SpO2 98%   BMI 32.20 kg/m     Wt Readings from Last 3 Encounters:  09/05/22 193 lb 8 oz (87.8 kg)  09/04/22 193 lb 8 oz (87.8 kg)  09/04/22 193 lb (87.5 kg)     GEN:  Well nourished, well developed in no acute distress HEENT: Normal NECK: No JVD; No carotid bruits LYMPHATICS: No lymphadenopathy CARDIAC: RRR, no murmurs, rubs, gallops RESPIRATORY:  Clear to auscultation without rales, wheezing or rhonchi  ABDOMEN: Soft, non-tender, non-distended MUSCULOSKELETAL:  No edema; No deformity  SKIN: Warm and dry NEUROLOGIC:  Alert and oriented x 3 PSYCHIATRIC:  Normal affect    Risk Assessment/Risk Calculators:                  ASSESSMENT & PLAN:    Chronic hypertension in pregnancy  Obesity  SVT Advanced maternal age  Blood pressure is at target, on her current regimen. But we need to continue to monitor her closely.she has has a few visit to the ED.   Diabetes mellitus [facility] team as well as our cardio-OB pharmacist who has been following with the patient. She will see our pharmacist in 1 week.  I will see her in 2 weeks.   The patient understands the need to lose weight with diet and exercise. We have discussed specific strategies for this.  Patient Instructions  Medication Instructions:  Nifedipine 60 mg twice a day *If you need a refill on your cardiac medications before your next appointment, please call your pharmacy*  Follow-Up: At [facility], you and your health needs are our priority.  As part of  our continuing  mission to provide you with exceptional heart care, we have created designated Provider Care Teams.  These Care Teams include your primary Cardiologist (physician) and Advanced Practice Providers (APPs -  Physician Assistants and Nurse Practitioners) who all work together to provide you with the care you need, when you need it.  We recommend signing up for the patient portal called "MyChart".  Sign up information is provided on this After Visit Summary.  MyChart is used to connect with patients for Virtual Visits (Telemedicine).  Patients are able to view lab/test results, encounter notes, upcoming appointments, etc.  Non-urgent messages can be sent to your provider as well.   To learn more about what you can do with MyChart, go to [email or web address].    Your next appointment:   One week with Laural Golden, [facility]- Cardio OB  2 weeks with Dr Servando Salina   Dispo:  No follow-ups on file.   Medication Adjustments/Labs and Tests Ordered: Current medicines are reviewed at length with the patient today.  Concerns regarding medicines are outlined above.  Tests Ordered: No orders of the defined types were placed in this encounter.  Medication Changes: Meds ordered this encounter  Medications   NIFEdipine (ADALAT CC) 60 MG 24 hr tablet    Sig: Take 1 tablet (60 mg total) by mouth in the morning and at bedtime.    Dispense:  180 tablet    Refill:  3

## 2022-09-10 DIAGNOSIS — Z419 Encounter for procedure for purposes other than remedying health state, unspecified: Secondary | ICD-10-CM | POA: Diagnosis not present

## 2022-09-12 ENCOUNTER — Ambulatory Visit (INDEPENDENT_AMBULATORY_CARE_PROVIDER_SITE_OTHER): Payer: BC Managed Care – PPO | Admitting: *Deleted

## 2022-09-12 ENCOUNTER — Ambulatory Visit (INDEPENDENT_AMBULATORY_CARE_PROVIDER_SITE_OTHER): Payer: BC Managed Care – PPO | Admitting: Obstetrics and Gynecology

## 2022-09-12 VITALS — BP 132/83 | HR 90 | Wt 195.0 lb

## 2022-09-12 DIAGNOSIS — O0990 Supervision of high risk pregnancy, unspecified, unspecified trimester: Secondary | ICD-10-CM

## 2022-09-12 DIAGNOSIS — O24414 Gestational diabetes mellitus in pregnancy, insulin controlled: Secondary | ICD-10-CM | POA: Diagnosis not present

## 2022-09-12 DIAGNOSIS — Z3A33 33 weeks gestation of pregnancy: Secondary | ICD-10-CM

## 2022-09-12 DIAGNOSIS — I471 Supraventricular tachycardia, unspecified: Secondary | ICD-10-CM

## 2022-09-12 DIAGNOSIS — O26839 Pregnancy related renal disease, unspecified trimester: Secondary | ICD-10-CM

## 2022-09-12 DIAGNOSIS — I517 Cardiomegaly: Secondary | ICD-10-CM

## 2022-09-12 DIAGNOSIS — N051 Unspecified nephritic syndrome with focal and segmental glomerular lesions: Secondary | ICD-10-CM

## 2022-09-12 DIAGNOSIS — O359XX0 Maternal care for (suspected) fetal abnormality and damage, unspecified, not applicable or unspecified: Secondary | ICD-10-CM

## 2022-09-12 DIAGNOSIS — O10919 Unspecified pre-existing hypertension complicating pregnancy, unspecified trimester: Secondary | ICD-10-CM

## 2022-09-12 NOTE — Progress Notes (Unsigned)
   PRENATAL VISIT NOTE  Subjective:  Selena Meyer is a 37 y.o. G1P0 at [redacted]w[redacted]d being seen today for ongoing prenatal care.  She is currently monitored for the following issues for this high-risk pregnancy and has FSGS (focal segmental glomerulosclerosis); SVT (supraventricular tachycardia); IDA (iron deficiency anemia); Supervision of high risk pregnancy, antepartum; Gestational diabetes mellitus (GDM), antepartum; AMA (advanced maternal age) primigravida 39+; Chronic hypertension during pregnancy, antepartum; Kidney disease in pregnancy, unspecified trimester; Morbid obesity (HCC); Moderate left ventricular hypertrophy; Known fetal anomaly, antepartum; Hypertensive crisis; and Hypertension affecting pregnancy on their problem list.  Patient reports {sx:14538}.  Contractions: Not present. Vag. Bleeding: None.  Movement: Present. Denies leaking of fluid.   The following portions of the patient's history were reviewed and updated as appropriate: allergies, current medications, past family history, past medical history, past social history, past surgical history and problem list.   Objective:   Vitals:   09/12/22 1531  BP: 132/83  Pulse: 90  Weight: 195 lb (88.5 kg)    Fetal Status:     Movement: Present     General:  Alert, oriented and cooperative. Patient is in no acute distress.  Skin: Skin is warm and dry. No rash noted.   Cardiovascular: Normal heart rate noted  Respiratory: Normal respiratory effort, no problems with respiration noted  Abdomen: Soft, gravid, appropriate for gestational age.  Pain/Pressure: Absent     Pelvic: {Blank single:19197::"Cervical exam performed in the presence of a chaperone","Cervical exam deferred"}        Extremities: Normal range of motion.  Edema: None  Mental Status: Normal mood and affect. Normal behavior. Normal judgment and thought content.   Assessment and Plan:  Pregnancy: G1P0 at [redacted]w[redacted]d  70s-80s outlier 98, pp 114-140  {Blank  single:19197::"Term","Preterm"} labor symptoms and general obstetric precautions including but not limited to vaginal bleeding, contractions, leaking of fluid and fetal movement were reviewed in detail with the patient. Please refer to After Visit Summary for other counseling recommendations.   No follow-ups on file.  Future Appointments  Date Time Provider Department Center  09/12/2022  3:50 PM Sue Lush, FNP [facility]  09/13/2022  2:45 PM [facility] US6 [facility]  09/15/2022  2:30 PM [facility] PHARMACIST [facility] None  09/19/2022 11:00 AM Tobb, Kardie, DO [facility] None  09/19/2022  3:30 PM Rasch, Harolyn Rutherford, NP [facility]  09/19/2022  3:30 PM [facility] NURSE [facility]  09/20/2022 12:30 PM [facility] US6 [facility]  09/28/2022  3:30 PM [facility] NURSE [facility]  09/28/2022  3:30 PM Milas Hock, MD [facility]  09/29/2022  1:15 PM [facility] NURSE [facility]  09/29/2022  1:30 PM [facility] US5 [facility]  10/16/2022  8:20 AM Tobb, Kardie, DO [facility] None  11/23/2022 11:00 AM [facility] LAB [facility] None  11/23/2022 11:15 AM Covington, Brand Males, PA-C [facility] None    Albertine Grates, FNP

## 2022-09-12 NOTE — Progress Notes (Signed)
NST

## 2022-09-12 NOTE — Progress Notes (Signed)
Called by NP Albertine Grates  Reviewed NST from [facility] office remotely.   Review of case- - High risk pregnancy with FSGD/CKD, GDM on insulin, fetal vetriculomegaly, cHTN on three BP medications.   - Patient was seen for NST on 8/26 that was minimal variability with variable decelerations and she was sent to MAU for extended monitoring. BPP in MAU and 1 hours of monitoring was reassuring. - BPP on 8/29 was 8/8   NST today showed 140/moderate/+accels and two variable decels but FHR stayed >100.   NST is reactive and reassuring. I am not currently worried about this infant's acid base status.   Patient has BPP tomorrow and endorsed normal fetal movement to office staff today.  I am overall reassured by this NST and patient had BPP 4 days ago and will have another tomorrow.  Based on the overall reassuring status of the NST today I think it reasonable for patient to be given strict precautions and have BPP as planned tomorrow. Low threshold to come to the MAU.

## 2022-09-13 ENCOUNTER — Ambulatory Visit: Payer: BC Managed Care – PPO | Attending: Obstetrics and Gynecology

## 2022-09-13 DIAGNOSIS — O26839 Pregnancy related renal disease, unspecified trimester: Secondary | ICD-10-CM | POA: Insufficient documentation

## 2022-09-13 DIAGNOSIS — O10919 Unspecified pre-existing hypertension complicating pregnancy, unspecified trimester: Secondary | ICD-10-CM | POA: Diagnosis not present

## 2022-09-13 DIAGNOSIS — O3500X0 Maternal care for (suspected) central nervous system malformation or damage in fetus, unspecified, not applicable or unspecified: Secondary | ICD-10-CM | POA: Diagnosis not present

## 2022-09-13 DIAGNOSIS — O99413 Diseases of the circulatory system complicating pregnancy, third trimester: Secondary | ICD-10-CM

## 2022-09-13 DIAGNOSIS — O10013 Pre-existing essential hypertension complicating pregnancy, third trimester: Secondary | ICD-10-CM | POA: Diagnosis not present

## 2022-09-13 DIAGNOSIS — O24415 Gestational diabetes mellitus in pregnancy, controlled by oral hypoglycemic drugs: Secondary | ICD-10-CM

## 2022-09-13 DIAGNOSIS — O24414 Gestational diabetes mellitus in pregnancy, insulin controlled: Secondary | ICD-10-CM | POA: Insufficient documentation

## 2022-09-13 DIAGNOSIS — I471 Supraventricular tachycardia, unspecified: Secondary | ICD-10-CM

## 2022-09-13 DIAGNOSIS — O113 Pre-existing hypertension with pre-eclampsia, third trimester: Secondary | ICD-10-CM

## 2022-09-13 DIAGNOSIS — E669 Obesity, unspecified: Secondary | ICD-10-CM

## 2022-09-13 DIAGNOSIS — Z3A34 34 weeks gestation of pregnancy: Secondary | ICD-10-CM

## 2022-09-13 DIAGNOSIS — D649 Anemia, unspecified: Secondary | ICD-10-CM

## 2022-09-13 DIAGNOSIS — O09513 Supervision of elderly primigravida, third trimester: Secondary | ICD-10-CM | POA: Diagnosis not present

## 2022-09-13 DIAGNOSIS — O99013 Anemia complicating pregnancy, third trimester: Secondary | ICD-10-CM

## 2022-09-13 DIAGNOSIS — O99213 Obesity complicating pregnancy, third trimester: Secondary | ICD-10-CM

## 2022-09-14 ENCOUNTER — Ambulatory Visit: Payer: BC Managed Care – PPO

## 2022-09-14 DIAGNOSIS — N051 Unspecified nephritic syndrome with focal and segmental glomerular lesions: Secondary | ICD-10-CM | POA: Diagnosis not present

## 2022-09-14 DIAGNOSIS — N182 Chronic kidney disease, stage 2 (mild): Secondary | ICD-10-CM | POA: Diagnosis not present

## 2022-09-14 DIAGNOSIS — I10 Essential (primary) hypertension: Secondary | ICD-10-CM | POA: Diagnosis not present

## 2022-09-14 DIAGNOSIS — O24419 Gestational diabetes mellitus in pregnancy, unspecified control: Secondary | ICD-10-CM | POA: Diagnosis not present

## 2022-09-15 ENCOUNTER — Encounter: Payer: Self-pay | Admitting: Pharmacist

## 2022-09-15 ENCOUNTER — Ambulatory Visit: Payer: BC Managed Care – PPO | Attending: Cardiology | Admitting: Pharmacist

## 2022-09-15 ENCOUNTER — Other Ambulatory Visit (HOSPITAL_COMMUNITY): Payer: Self-pay

## 2022-09-15 VITALS — BP 130/77 | HR 92 | Wt 194.6 lb

## 2022-09-15 DIAGNOSIS — O24414 Gestational diabetes mellitus in pregnancy, insulin controlled: Secondary | ICD-10-CM

## 2022-09-15 DIAGNOSIS — O10919 Unspecified pre-existing hypertension complicating pregnancy, unspecified trimester: Secondary | ICD-10-CM

## 2022-09-15 DIAGNOSIS — O0990 Supervision of high risk pregnancy, unspecified, unspecified trimester: Secondary | ICD-10-CM

## 2022-09-15 DIAGNOSIS — Z3A34 34 weeks gestation of pregnancy: Secondary | ICD-10-CM

## 2022-09-15 MED ORDER — GLUCOSE BLOOD VI STRP
ORAL_STRIP | Freq: Four times a day (QID) | 12 refills | Status: DC
Start: 2022-09-15 — End: 2022-10-02
  Filled 2022-09-15 – 2022-09-17 (×2): qty 100, fill #0
  Filled 2022-09-18 (×2): qty 100, 25d supply, fill #0

## 2022-09-15 NOTE — Patient Instructions (Addendum)
It was nice seeing you again  Your blood pressure looks great today and your blood sugar sounds well controlled  Continue healthy eating and try to walk this weekend now that the weather is nicer  Continue your labetalol 800 three times daily and nifedipine 60mg  twice a day and your hydralazine 25 mg 4 times a day  Let us know if you have any questions  Laural Golden, PharmD, BCACP, CDCES, CPP [address], [address] [place], [place], [address] Phone: [phone], Fax: [phone]

## 2022-09-15 NOTE — Progress Notes (Signed)
Patient ID: BLASA RUEDIGER                 DOB: Apr 05, 1985                      MRN: [id number]     HPI: Selena Meyer is a 37 y.o. female referred by Dr. Servando Salina to HTN clinic. PMH is significant for HTN, GDM, renal disease, and SVT.   Patient presents today for f/u with aunt. Currently 34w 2d pregnant. G1P0. Fatigued from lack of sleep due to waking up to frequently urinate and take medications.   Managed on labetalol 800mg  TID, hydralazine 25mg  QID, and nifedipine 60mg  BID. Compliant with all HTN medications. No chest pain or SOB. Went walking last weekend but stopped when her hands began swelling.  For GDM is now on basal/bolus regimen. Glargine 7 units BID and Novolog 5 units before meals. Checks blood sugar multiple times a day. FBG this morning was 80. Last night before bed (2 hours after last meal) blood glucose was 110. Was prescribed a Freestyle libre sensor but is not wearing. Prefers to finger stick.  Currently scheduled for delivery on 9/25 although OB has recommended if BP uncontrolled may be pushed up. Patient hopeful to make it to 9/25.  Current HTN meds:  Hydralazine 25mg  QID Labetalol 800mg  TID Nifedipine 60mg  BID  Wt Readings from Last 3 Encounters:  09/12/22 195 lb (88.5 kg)  09/05/22 193 lb 8 oz (87.8 kg)  09/04/22 193 lb 8 oz (87.8 kg)   BP Readings from Last 3 Encounters:  09/13/22 (!) 125/58  09/12/22 132/83  09/07/22 (!) 121/55   Pulse Readings from Last 3 Encounters:  09/13/22 79  09/12/22 90  09/07/22 88    Renal function: Estimated Creatinine Clearance: 73.4 mL/min (A) (by C-G formula based on SCr of 1.13 mg/dL (H)).  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 FSGS vs MCD, biopsy 09/2017 podocytopathy but  nondiagnostic.    Gestational diabetes    Hypertension    Hypokalemia 11/02/2020   Irregular menstrual cycle    Irregular periods/menstrual cycles 05/03/2017   Will hold off on Provera or other fertility stuff for now, pt advised pregnancy is not recommended until BP under better control at least, plus other workup pen   Polycystic ovaries 11/09/2015   Proteinuria 02/28/2013   Stage 2 chronic kidney disease 04/27/2020   SVT (supraventricular tachycardia) 11/01/2020   Systolic murmur 07/16/2017   Tobacco abuse 02/28/2013   Uncontrolled hypertension 05/10/2017    Current Outpatient Medications on File Prior to Visit  Medication Sig Dispense Refill   Accu-Chek Softclix Lancets lancets 100 each by Other route 4 (four) times daily. Use as instructed 100 each 12   acetaminophen (TYLENOL) 500 MG tablet Take 1,000 mg by mouth every 6 (six) hours as needed for mild pain.      albuterol (VENTOLIN HFA) 108 (90 Base) MCG/ACT inhaler Inhale 2 puffs into the lungs every 6 (six) hours as needed for wheezing or shortness of breath. 1 each 1   aspirin EC 81 MG tablet Take 2 tablets (162 mg total) by mouth daily. Swallow whole. 30 tablet 12   Continuous Glucose Sensor (FREESTYLE LIBRE 3 SENSOR) MISC Place 1 sensor on the skin  every 14 days. Use to check glucose continuously 2 each 6   ferrous sulfate 325 (65 FE) MG tablet Take by mouth.     glucose blood test strip Use as instructed 100 each 12   hydrALAZINE (APRESOLINE) 25 MG tablet Take 1 tablet (25 mg total) by mouth every 6 (six) hours. 120 tablet 0   insulin aspart (NOVOLOG FLEXPEN) 100 UNIT/ML FlexPen Inject 5 Units into the skin with breakfast, with lunch, and with evening meal. 15 mL 11   insulin glargine-yfgn (SEMGLEE) 100 UNIT/ML Pen Inject 7 Units into the skin 2 (two) times daily. 15 mL 11   Insulin Pen Needle 32G X 4 MM MISC Use as directed subcutaneously. 100 each 1   ipratropium (ATROVENT) 0.06 % nasal spray Place 2 sprays into both  nostrils 4 (four) times daily. As needed for runny nose / postnasal drip 15 mL 1   labetalol (NORMODYNE) 200 MG tablet Take 4 tablets (800 mg total) by mouth every 8 (eight) hours. 360 tablet 1   MAGNESIUM-OXIDE 400 (240 Mg) MG tablet Take 0.5 tablets by mouth daily.     metoCLOPramide (REGLAN) 10 MG tablet Take 1 tablet (10 mg total) by mouth every 6 (six) hours as needed for nausea. 30 tablet 3   NIFEdipine (ADALAT CC) 60 MG 24 hr tablet Take 1 tablet (60 mg total) by mouth in the morning and at bedtime. 180 tablet 3   omeprazole (PRILOSEC) 20 MG capsule Take 1 capsule (20 mg total) by mouth daily. 90 capsule 1   ondansetron (ZOFRAN) 8 MG tablet Take 1 tablet (8 mg total) by mouth every 8 (eight) hours as needed for nausea or vomiting. 30 tablet 1   Prenatal Vit-Fe Fumarate-FA (MULTIVITAMIN-PRENATAL) 27-0.8 MG TABS tablet Take 1 tablet by mouth daily at 12 noon.     No current facility-administered medications on file prior to visit.    Allergies  Allergen Reactions   Ibuprofen Hives     Assessment/Plan:  1. Hypertension -  BP in room today controlled at 130/77 on current HTN regimen. Applauded her on her commitment to her HTN regimen. Encouraged continued exercise now that weather is cooler.   Continue:  Labetalol 800mg  TID Hydralazine 25mg  QID Nifedipine 60mg  BID  2. GDM - Did not bring BG log but reported home readings are at goal. Encouraged her to use her sensor to avoid frequent finger sticks especially now that she is on bolus insulin. She will consider. Will refill her test strips.  Continue: Glargine 7 units BID Novolog 5 units TID before meals  Laural Golden, PharmD, BCACP, CDCES, CPP [address] [place], [place], [address] Phone: [phone], Fax: [phone]

## 2022-09-18 ENCOUNTER — Other Ambulatory Visit: Payer: Self-pay

## 2022-09-18 ENCOUNTER — Other Ambulatory Visit (HOSPITAL_COMMUNITY): Payer: Self-pay

## 2022-09-18 MED FILL — Magnesium Sulfate IV Soln 40 GM/1000ML (40 MG/ML): INTRAVENOUS | Qty: 1000 | Status: AC

## 2022-09-19 ENCOUNTER — Encounter: Payer: Self-pay | Admitting: Cardiology

## 2022-09-19 ENCOUNTER — Other Ambulatory Visit: Payer: BC Managed Care – PPO

## 2022-09-19 ENCOUNTER — Ambulatory Visit (INDEPENDENT_AMBULATORY_CARE_PROVIDER_SITE_OTHER): Payer: BC Managed Care – PPO | Admitting: Obstetrics and Gynecology

## 2022-09-19 ENCOUNTER — Ambulatory Visit: Payer: BC Managed Care – PPO | Attending: Cardiology | Admitting: Cardiology

## 2022-09-19 VITALS — BP 125/79 | HR 86 | Wt 194.0 lb

## 2022-09-19 VITALS — BP 127/80 | HR 89 | Ht 65.0 in | Wt 195.0 lb

## 2022-09-19 DIAGNOSIS — Z3A35 35 weeks gestation of pregnancy: Secondary | ICD-10-CM

## 2022-09-19 DIAGNOSIS — O10919 Unspecified pre-existing hypertension complicating pregnancy, unspecified trimester: Secondary | ICD-10-CM

## 2022-09-19 DIAGNOSIS — O163 Unspecified maternal hypertension, third trimester: Secondary | ICD-10-CM

## 2022-09-19 DIAGNOSIS — O09513 Supervision of elderly primigravida, third trimester: Secondary | ICD-10-CM

## 2022-09-19 DIAGNOSIS — O0990 Supervision of high risk pregnancy, unspecified, unspecified trimester: Secondary | ICD-10-CM

## 2022-09-19 DIAGNOSIS — I471 Supraventricular tachycardia, unspecified: Secondary | ICD-10-CM

## 2022-09-19 DIAGNOSIS — O24414 Gestational diabetes mellitus in pregnancy, insulin controlled: Secondary | ICD-10-CM

## 2022-09-19 NOTE — Patient Instructions (Addendum)
Medication Instructions:  Your physician recommends that you continue on your current medications as directed. Please refer to the Current Medication list given to you today.    *If you need a refill on your cardiac medications before your next appointment, please call your pharmacy*      Follow-Up: At [facility], you and your health needs are our priority.  As part of our continuing mission to provide you with exceptional heart care, we have created designated Provider Care Teams.  These Care Teams include your primary Cardiologist (physician) and Advanced Practice Providers (APPs -  Physician Assistants and Nurse Practitioners) who all work together to provide you with the care you need, when you need it.  We recommend signing up for the patient portal called "MyChart".  Sign up information is provided on this After Visit Summary.  MyChart is used to connect with patients for Virtual Visits (Telemedicine).  Patients are able to view lab/test results, encounter notes, upcoming appointments, etc.  Non-urgent messages can be sent to your provider as well.   To learn more about what you can do with MyChart, go to [email or web address].    Your next appointment:   September 17 at 8:20am   The format for your next appointment:   In Person  Provider:   Thomasene Ripple, DO

## 2022-09-19 NOTE — Progress Notes (Unsigned)
   PRENATAL VISIT NOTE  Subjective:  Selena Meyer is a 37 y.o. G1P0 at [redacted]w[redacted]d being seen today for ongoing prenatal care.  She is currently monitored for the following issues for this high-risk pregnancy and has FSGS (focal segmental glomerulosclerosis); SVT (supraventricular tachycardia); IDA (iron deficiency anemia); Supervision of high risk pregnancy, antepartum; Gestational diabetes mellitus (GDM), antepartum; AMA (advanced maternal age) primigravida 3+; Chronic hypertension during pregnancy, antepartum; Kidney disease in pregnancy, unspecified trimester; Morbid obesity (HCC); Moderate left ventricular hypertrophy; Known fetal anomaly, antepartum; Hypertensive crisis; and Hypertension affecting pregnancy on their problem list.  Patient reports {sx:14538}.  Contractions: Not present. Vag. Bleeding: None.  Movement: Present. Denies leaking of fluid.   The following portions of the patient's history were reviewed and updated as appropriate: allergies, current medications, past family history, past medical history, past social history, past surgical history and problem list.   Objective:   Vitals:   09/19/22 1533  BP: 125/79  Pulse: 86  Weight: 194 lb (88 kg)    Fetal Status: Fetal Heart Rate (bpm): 134   Movement: Present     General:  Alert, oriented and cooperative. Patient is in no acute distress.  Skin: Skin is warm and dry. No rash noted.   Cardiovascular: Normal heart rate noted  Respiratory: Normal respiratory effort, no problems with respiration noted  Abdomen: Soft, gravid, appropriate for gestational age.  Pain/Pressure: Absent     Pelvic: {Blank single:19197::"Cervical exam performed in the presence of a chaperone","Cervical exam deferred"}        Extremities: Normal range of motion.  Edema: None  Mental Status: Normal mood and affect. Normal behavior. Normal judgment and thought content.   Assessment and Plan:  Pregnancy: G1P0 at [redacted]w[redacted]d  1. Chronic hypertension during  pregnancy, antepartum   2. Insulin controlled gestational diabetes mellitus (GDM) during pregnancy, antepartum   Some nausea in the AM Recommend protein snack before bed.  No BS log today.  Reports fasting BS 77-91 Lab sent via mychart. Fasting BS 95- 100's Majority of 2 hour post meals <120  3. Supervision of high risk pregnancy, antepartum  - MFM BPP tomorrow.     {Blank single:19197::"Term","Preterm"} labor symptoms and general obstetric precautions including but not limited to vaginal bleeding, contractions, leaking of fluid and fetal movement were reviewed in detail with the patient. Please refer to After Visit Summary for other counseling recommendations.   No follow-ups on file.  Future Appointments  Date Time Provider Department Center  09/26/2022  8:40 AM Tobb, Lavona Mound, DO [facility] None  09/28/2022  3:30 PM [facility] NURSE [facility]  09/28/2022  3:30 PM Milas Hock, MD [facility]  09/29/2022  1:15 PM [facility] NURSE [facility]  09/29/2022  1:30 PM [facility] US5 [facility]  10/04/2022  6:30 AM [facility] SCHED ROOM [facility] None  10/16/2022  8:20 AM Tobb, Kardie, DO [facility] None  11/23/2022 11:00 AM [facility] None  11/23/2022 11:15 AM Covington, Brand Males, PA-C [facility] None    Venia Carbon, NP

## 2022-09-19 NOTE — Progress Notes (Unsigned)
Cardio-Obstetrics Clinic  New Evaluation  Date:  09/20/2022   ID:  Selena Rakes, DO B [birth year], MRN [id number]  PCP:  [name], NP   [facility] Providers Cardiologist:  Thomasene Ripple, DO  Electrophysiologist:  None       Referring MD: [name], NP   Chief Complaint: " I am ok"  History of Present Illness:    Selena Meyer is a 37 y.o. female [G1P0] who is being seen today in follow up for FSGS, chronic hypertension in pregnancy.  Medical history includes SVT, Hypertension, hypertriglyceridemia, FSGS, gestational diabetes.   She is currently 34 weeks and 6 days pregnant. She offers no complaints at this time.  She is here with her aunt.  Prior CV Studies Reviewed: The following studies were reviewed today:  TTE 2019 Study Conclusions   - Left ventricle: The cavity size was normal. Wall thickness was    increased in a pattern of moderate LVH. Systolic function was    normal. Wall motion was normal; there were no regional wall    motion abnormalities. Doppler parameters are consistent with    abnormal left ventricular relaxation (grade 1 diastolic    dysfunction).   -------------------------------------------------------------------  Study data:  No prior study was available for comparison.  Study  status:  Routine.  Procedure:  Transthoracic echocardiography.  Image quality was adequate.  Study completion:  There were no  complications.          Transthoracic echocardiography.  M-mode,  complete 2D, spectral Doppler, and color Doppler.  Birthdate:  Patient birthdate: [birth year].  Age:  Patient is 37 yr old.  Sex:  Gender: female.    BMI: 32.7 kg/m^2.  Blood pressure:     140/101  Patient status:  Outpatient.  Study date:  Study date: 08/01/2017.  Study time: 10:14 AM.  Location:  Echo laboratory.   -------------------------------------------------------------------   -------------------------------------------------------------------  Left  ventricle:  The cavity size was normal. Wall thickness was  increased in a pattern of moderate LVH. Systolic function was  normal. Wall motion was normal; there were no regional wall motion  abnormalities. Doppler parameters are consistent with abnormal left  ventricular relaxation (grade 1 diastolic dysfunction).   -------------------------------------------------------------------  Aortic valve:   Structurally normal valve. Trileaflet; normal  thickness leaflets. Cusp separation was normal. Mobility was not  restricted.  Doppler:  Transvalvular velocity was within the normal  range. There was no stenosis. There was no regurgitation.   -------------------------------------------------------------------  Aorta: The aorta was normal, not dilated, and non-diseased. Aortic  root: The aortic root was normal in size.   -------------------------------------------------------------------  Mitral valve:   Structurally normal valve.   Leaflet separation was  normal. Mobility was not restricted.  Doppler:  Transvalvular  velocity was within the normal range. There was no evidence for  stenosis. There was no regurgitation.    Peak gradient (D): 2 mm  Hg.   -------------------------------------------------------------------  Left atrium:  The atrium was normal in size.   -------------------------------------------------------------------  Atrial septum:  The septum was normal.   -------------------------------------------------------------------  Pulmonary veins:  Common pulmonary vein:  The Doppler velocity and flow profile were  normal.   -------------------------------------------------------------------  Right ventricle:  The cavity size was normal. Wall thickness was  normal. Systolic function was normal.   -------------------------------------------------------------------  Pulmonic valve:    Structurally normal valve.   Cusp separation was  normal.  Doppler:  Transvalvular velocity  was within the normal  range. There was no evidence  for stenosis. There was no  regurgitation.   -------------------------------------------------------------------  Tricuspid valve:   Structurally normal valve.   Leaflet separation  was normal.  Doppler:  Transvalvular velocity was within the normal  range. There was no regurgitation.   -------------------------------------------------------------------  Pulmonary artery:   The main pulmonary artery was normal-sized.  Systolic pressure was within the normal range.   -------------------------------------------------------------------  Right atrium:  The atrium was normal in size.   -------------------------------------------------------------------  Pericardium: The pericardium was normal in appearance. There was  no pericardial effusion.   -------------------------------------------------------------------  Systemic veins:  Inferior vena cava: The vessel was normal in size. The  respirophasic diameter changes were in the normal range (>= 50%),  consistent with normal central venous pressure.   -------------------------------------------------------------------  Post procedure conclusions  Ascending Aorta:   - The aorta was normal, not dilated, and non-diseased.   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    Gestational diabetes    Hypertension    Hypokalemia 11/02/2020   Irregular menstrual cycle    Irregular periods/menstrual cycles 05/03/2017   Will hold off on Provera or other fertility stuff for now, pt advised pregnancy is not recommended until BP under better control at least, plus other workup pen    Polycystic ovaries 11/09/2015   Proteinuria 02/28/2013   Stage 2 chronic kidney disease 04/27/2020   SVT (supraventricular tachycardia) 11/01/2020   Systolic murmur 07/16/2017   Tobacco abuse 02/28/2013   Uncontrolled hypertension 05/10/2017    Past Surgical History:  Procedure Laterality Date   NO PAST SURGERIES        OB History     Gravida  1   Para      Term      Preterm      AB      Living         SAB      IAB      Ectopic      Multiple      Live Births                  Current Medications: Current Meds  Medication Sig   Accu-Chek Softclix Lancets lancets 100 each by Other route 4 (four) times daily. Use as instructed   acetaminophen (TYLENOL) 500 MG tablet Take 1,000 mg by mouth every 6 (six) hours as needed for mild pain.    albuterol (VENTOLIN HFA) 108 (90 Base) MCG/ACT inhaler Inhale 2 puffs into the lungs every 6 (six) hours as needed for wheezing or shortness of breath.   aspirin EC 81 MG tablet Take 2 tablets (162 mg total) by mouth daily. Swallow whole.   Continuous Glucose Sensor (FREESTYLE LIBRE 3 SENSOR) MISC Place 1 sensor on the skin every 14 days. Use to check glucose continuously   ferrous sulfate 325 (65 FE) MG tablet Take by mouth.   glucose blood test strip Use to check blood sugar 4 (four) times daily.   hydrALAZINE (APRESOLINE) 25 MG tablet Take 1 tablet (25 mg total) by mouth every 6 (six) hours.   insulin aspart (NOVOLOG FLEXPEN) 100 UNIT/ML FlexPen Inject 5 Units into the skin with breakfast, with lunch, and with evening meal.   insulin glargine-yfgn (SEMGLEE)  100 UNIT/ML Pen Inject 7 Units into the skin 2 (two) times daily.   Insulin Pen Needle 32G X 4 MM MISC Use as directed subcutaneously.   ipratropium (ATROVENT) 0.06 % nasal spray Place 2 sprays into both nostrils 4 (four) times daily. As needed for runny nose / postnasal drip   labetalol (NORMODYNE) 200 MG tablet Take 4 tablets (800 mg total) by mouth every 8 (eight)  hours.   MAGNESIUM-OXIDE 400 (240 Mg) MG tablet Take 0.5 tablets by mouth daily.   metoCLOPramide (REGLAN) 10 MG tablet Take 1 tablet (10 mg total) by mouth every 6 (six) hours as needed for nausea.   NIFEdipine (ADALAT CC) 60 MG 24 hr tablet Take 1 tablet (60 mg total) by mouth in the morning and at bedtime.   omeprazole (PRILOSEC) 20 MG capsule Take 1 capsule (20 mg total) by mouth daily.   ondansetron (ZOFRAN) 8 MG tablet Take 1 tablet (8 mg total) by mouth every 8 (eight) hours as needed for nausea or vomiting.   Prenatal Vit-Fe Fumarate-FA (MULTIVITAMIN-PRENATAL) 27-0.8 MG TABS tablet Take 1 tablet by mouth daily at 12 noon.     Allergies:   Ibuprofen   Social History   Socioeconomic History   Marital status: Married    Spouse name: Ivar Drape   Number of children: 0   Years of education: Not on file   Highest education level: Not on file  Occupational History    Employer: UNEMPLOYED  Tobacco Use   Smoking status: Former    Current packs/day: 0.00    Average packs/day: 0.3 packs/day for 5.0 years (1.3 ttl pk-yrs)    Types: Cigarettes    Start date: 05/16/2016    Quit date: 05/16/2021    Years since quitting: 1.3   Smokeless tobacco: Never   Tobacco comments:    Quit smoking 05/23/2023after her mom passed away  Vaping Use   Vaping status: Never Used  Substance and Sexual Activity   Alcohol use: Not Currently    Comment: "occasional use; social drinker"   Drug use: No   Sexual activity: Yes    Partners: Male    Birth control/protection: None    Comment: Pregnant  Other Topics Concern   Not on file  Social History Narrative   Not on file   Social Determinants of Health   Financial Resource Strain: Low Risk  (05/08/2022)   Received from [facility], [facility]   Overall Financial Resource Strain (CARDIA)    Difficulty of Paying Living Expenses: Not very hard  Food Insecurity: No Food Insecurity (08/18/2022)   Hunger Vital Sign    Worried About Running Out of Food in  the Last Year: Never true    Ran Out of Food in the Last Year: Never true  Transportation Needs: No Transportation Needs (08/18/2022)   PRAPARE - Administrator, Civil Service (Medical): No    Lack of Transportation (Non-Medical): No  Physical Activity: Unknown (11/03/2020)   Received from [facility], [facility]   Exercise Vital Sign    Days of Exercise per Week: 5 days    Minutes of Exercise per Session: Patient declined  Stress: Stress Concern Present (11/03/2020)   Received from [facility] Occupational Health - Occupational Stress Questionnaire    Feeling of Stress : To some extent  Social Connections: Unknown (05/10/2021)   Received from [facility], [facility]   Social Network    Social Network:  Not on file      Family History  Problem Relation Age of Onset   Diabetes Mother    High blood pressure Mother    Renal Disease Mother    Diabetes Sister    Cancer Maternal Grandmother    Diabetes Maternal Grandmother    High blood pressure Maternal Grandmother    Cancer Maternal Aunt        breast   Other Father       ROS:   Please see the history of present illness.     All other systems reviewed and are negative.   Labs/EKG Reviewed:    EKG:   EKG is was not ordered today.    Recent Labs: 08/03/2022: TSH 1.010 08/18/2022: Magnesium 6.1 09/04/2022: ALT 18; BUN 8; Creatinine, Ser 1.13; Hemoglobin 9.9; Platelets 319; Potassium 3.7; Sodium 134   Recent Lipid Panel Lab Results  Component Value Date/Time   CHOL 240 (H) 11/15/2021 12:00 AM   TRIG 492 (H) 11/15/2021 12:00 AM   HDL 40 (L) 11/15/2021 12:00 AM   CHOLHDL 6.0 (H) 11/15/2021 12:00 AM   LDLCALC  11/15/2021 12:00 AM     Comment:     . LDL cholesterol not calculated. Triglyceride levels greater than 400 mg/dL invalidate calculated LDL results. . Reference range: <100 . Desirable range <100 mg/dL for primary prevention;   <70 mg/dL for patients  with CHD or diabetic patients  with > or = 2 CHD risk factors. [name] LDL-C is now calculated using the Martin-Hopkins  calculation, which is a validated novel method providing  better accuracy than the Friedewald equation in the  estimation of LDL-C.  Horald Pollen et al. Lenox Ahr. 6578;469(62): 2061-2068  (http://education.[employer].com/faq/FAQ164)     Physical Exam:    VS:  BP 127/80   Pulse 89   Ht 5\' 5"  (1.651 m)   Wt 195 lb (88.5 kg)   LMP 01/18/2022   SpO2 97%   BMI 32.45 kg/m     Wt Readings from Last 3 Encounters:  09/19/22 194 lb (88 kg)  09/19/22 195 lb (88.5 kg)  09/15/22 194 lb 9.6 oz (88.3 kg)     GEN:  Well nourished, well developed in no acute distress HEENT: Normal NECK: No JVD; No carotid bruits LYMPHATICS: No lymphadenopathy CARDIAC: RRR, no murmurs, rubs, gallops RESPIRATORY:  Clear to auscultation without rales, wheezing or rhonchi  ABDOMEN: Soft, non-tender, non-distended MUSCULOSKELETAL:  No edema; No deformity  SKIN: Warm and dry NEUROLOGIC:  Alert and oriented x 3 PSYCHIATRIC:  Normal affect    Risk Assessment/Risk Calculators:                  ASSESSMENT & PLAN:    Chronic hypertension in pregnancy  Obesity  SVT Advanced maternal age  Blood pressure is at target, on her current regimen. But we need to continue to monitor her closely.I will see her back in 1 week.  Will continue hydralazine 25 mg every 6 hours, labetalol 800 mg every 8 hours, and nifedipine 60 mg twice daily.  Diabetes mellitus is being managed by the [facility] team as well as our cardio-OB pharmacist who has been following with the patient.   She is scheduled for induction on October 04, 2022.  The patient understands the need to lose weight with diet and exercise. We have discussed specific strategies for this.  Patient Instructions  Medication Instructions:  Your physician recommends that you continue on your current medications as directed. Please refer to the  Current Medication list given to you today.    *If you need a refill on your cardiac medications before your next appointment, please call your pharmacy*      Follow-Up: At [facility], you and your health needs are our priority.  As part of our continuing mission to provide you with exceptional heart care, we have created designated Provider Care Teams.  These Care Teams include your primary Cardiologist (physician) and Advanced Practice Providers (APPs -  Physician Assistants and Nurse Practitioners) who all work together to provide you with the care you need, when you need it.  We recommend signing up for the patient portal called "MyChart".  Sign up information is provided on this After Visit Summary.  MyChart is used to connect with patients for Virtual Visits (Telemedicine).  Patients are able to view lab/test results, encounter notes, upcoming appointments, etc.  Non-urgent messages can be sent to your provider as well.   To learn more about what you can do with MyChart, go to [email or web address].    Your next appointment:   September 17 at 8:20am   The format for your next appointment:   In Person  Provider:   Thomasene Ripple, DO       Dispo:  No follow-ups on file.   Medication Adjustments/Labs and Tests Ordered: Current medicines are reviewed at length with the patient today.  Concerns regarding medicines are outlined above.  Tests Ordered: No orders of the defined types were placed in this encounter.  Medication Changes: No orders of the defined types were placed in this encounter.

## 2022-09-19 NOTE — Progress Notes (Signed)
Pt c/o vaginal lesion Pt has BPP scheduled tomorrow

## 2022-09-20 ENCOUNTER — Ambulatory Visit: Payer: BC Managed Care – PPO | Attending: Obstetrics and Gynecology

## 2022-09-20 DIAGNOSIS — O09513 Supervision of elderly primigravida, third trimester: Secondary | ICD-10-CM | POA: Diagnosis not present

## 2022-09-20 DIAGNOSIS — I471 Supraventricular tachycardia, unspecified: Secondary | ICD-10-CM

## 2022-09-20 DIAGNOSIS — O99413 Diseases of the circulatory system complicating pregnancy, third trimester: Secondary | ICD-10-CM

## 2022-09-20 DIAGNOSIS — O3509X0 Maternal care for (suspected) other central nervous system malformation or damage in fetus, not applicable or unspecified: Secondary | ICD-10-CM | POA: Diagnosis not present

## 2022-09-20 DIAGNOSIS — Z3A35 35 weeks gestation of pregnancy: Secondary | ICD-10-CM

## 2022-09-20 DIAGNOSIS — O10919 Unspecified pre-existing hypertension complicating pregnancy, unspecified trimester: Secondary | ICD-10-CM | POA: Diagnosis not present

## 2022-09-20 DIAGNOSIS — D649 Anemia, unspecified: Secondary | ICD-10-CM

## 2022-09-20 DIAGNOSIS — O10013 Pre-existing essential hypertension complicating pregnancy, third trimester: Secondary | ICD-10-CM

## 2022-09-20 DIAGNOSIS — E669 Obesity, unspecified: Secondary | ICD-10-CM

## 2022-09-20 DIAGNOSIS — O26839 Pregnancy related renal disease, unspecified trimester: Secondary | ICD-10-CM | POA: Diagnosis not present

## 2022-09-20 DIAGNOSIS — O99213 Obesity complicating pregnancy, third trimester: Secondary | ICD-10-CM

## 2022-09-20 DIAGNOSIS — O24414 Gestational diabetes mellitus in pregnancy, insulin controlled: Secondary | ICD-10-CM | POA: Diagnosis not present

## 2022-09-20 DIAGNOSIS — O99013 Anemia complicating pregnancy, third trimester: Secondary | ICD-10-CM

## 2022-09-21 ENCOUNTER — Ambulatory Visit: Payer: BC Managed Care – PPO

## 2022-09-22 ENCOUNTER — Other Ambulatory Visit: Payer: Self-pay | Admitting: Obstetrics and Gynecology

## 2022-09-26 ENCOUNTER — Other Ambulatory Visit: Payer: Self-pay

## 2022-09-26 ENCOUNTER — Ambulatory Visit: Payer: BC Managed Care – PPO | Attending: Cardiology | Admitting: Cardiology

## 2022-09-26 ENCOUNTER — Other Ambulatory Visit (HOSPITAL_COMMUNITY): Payer: Self-pay

## 2022-09-26 ENCOUNTER — Encounter: Payer: Self-pay | Admitting: Cardiology

## 2022-09-26 VITALS — BP 128/64 | HR 90 | Ht 65.0 in | Wt 199.8 lb

## 2022-09-26 DIAGNOSIS — O163 Unspecified maternal hypertension, third trimester: Secondary | ICD-10-CM | POA: Diagnosis not present

## 2022-09-26 DIAGNOSIS — N051 Unspecified nephritic syndrome with focal and segmental glomerular lesions: Secondary | ICD-10-CM | POA: Diagnosis not present

## 2022-09-26 DIAGNOSIS — O09513 Supervision of elderly primigravida, third trimester: Secondary | ICD-10-CM

## 2022-09-26 DIAGNOSIS — Z3A35 35 weeks gestation of pregnancy: Secondary | ICD-10-CM

## 2022-09-26 DIAGNOSIS — I471 Supraventricular tachycardia, unspecified: Secondary | ICD-10-CM | POA: Diagnosis not present

## 2022-09-26 DIAGNOSIS — O0990 Supervision of high risk pregnancy, unspecified, unspecified trimester: Secondary | ICD-10-CM

## 2022-09-26 NOTE — Patient Instructions (Signed)
Medication Instructions:  Your physician recommends that you continue on your current medications as directed. Please refer to the Current Medication list given to you today.  *If you need a refill on your cardiac medications before your next appointment, please call your pharmacy*   Lab Work: None   Testing/Procedures: None   Follow-Up: At [facility], you and your health needs are our priority.  As part of our continuing mission to provide you with exceptional heart care, we have created designated Provider Care Teams.  These Care Teams include your primary Cardiologist (physician) and Advanced Practice Providers (APPs -  Physician Assistants and Nurse Practitioners) who all work together to provide you with the care you need, when you need it.   Your next appointment:    October 2nd @ 11:30am  Provider:   Thomasene Ripple, DO

## 2022-09-26 NOTE — Progress Notes (Signed)
Cardio-Obstetrics Clinic  New Evaluation  Date:  09/30/2022   ID:  [name], DO B 1985-06-09, MRN [id number]  PCP:  [name], NP   [facility] Providers Cardiologist:  Thomasene Ripple, DO  Electrophysiologist:  None       Referring MD: [name], NP   Chief Complaint: " I am ok"  History of Present Illness:    Selena Meyer is a 37 y.o. female [G1P0101] who is being seen today in follow up for FSGS, chronic hypertension in pregnancy.  Medical history includes SVT, Hypertension, hypertriglyceridemia, FSGS, gestational diabetes.   She is currently 35 weeks and 6 days pregnant. She offers no complaints at this time.   Prior CV Studies Reviewed: The following studies were reviewed today:  TTE 2019 Study Conclusions   - Left ventricle: The cavity size was normal. Wall thickness was    increased in a pattern of moderate LVH. Systolic function was    normal. Wall motion was normal; there were no regional wall    motion abnormalities. Doppler parameters are consistent with    abnormal left ventricular relaxation (grade 1 diastolic    dysfunction).   -------------------------------------------------------------------  Study data:  No prior study was available for comparison.  Study  status:  Routine.  Procedure:  Transthoracic echocardiography.  Image quality was adequate.  Study completion:  There were no  complications.          Transthoracic echocardiography.  M-mode,  complete 2D, spectral Doppler, and color Doppler.  Birthdate:  Patient birthdate: 07/02/1985.  Age:  Patient is 37 yr old.  Sex:  Gender: female.    BMI: 32.7 kg/m^2.  Blood pressure:     140/101  Patient status:  Outpatient.  Study date:  Study date: 08/01/2017.  Study time: 10:14 AM.  Location:  Echo laboratory.   -------------------------------------------------------------------   -------------------------------------------------------------------  Left ventricle:  The cavity size was  normal. Wall thickness was  increased in a pattern of moderate LVH. Systolic function was  normal. Wall motion was normal; there were no regional wall motion  abnormalities. Doppler parameters are consistent with abnormal left  ventricular relaxation (grade 1 diastolic dysfunction).   -------------------------------------------------------------------  Aortic valve:   Structurally normal valve. Trileaflet; normal  thickness leaflets. Cusp separation was normal. Mobility was not  restricted.  Doppler:  Transvalvular velocity was within the normal  range. There was no stenosis. There was no regurgitation.   -------------------------------------------------------------------  Aorta: The aorta was normal, not dilated, and non-diseased. Aortic  root: The aortic root was normal in size.   -------------------------------------------------------------------  Mitral valve:   Structurally normal valve.   Leaflet separation was  normal. Mobility was not restricted.  Doppler:  Transvalvular  velocity was within the normal range. There was no evidence for  stenosis. There was no regurgitation.    Peak gradient (D): 2 mm  Hg.   -------------------------------------------------------------------  Left atrium:  The atrium was normal in size.   -------------------------------------------------------------------  Atrial septum:  The septum was normal.   -------------------------------------------------------------------  Pulmonary veins:  Common pulmonary vein:  The Doppler velocity and flow profile were  normal.   -------------------------------------------------------------------  Right ventricle:  The cavity size was normal. Wall thickness was  normal. Systolic function was normal.   -------------------------------------------------------------------  Pulmonic valve:    Structurally normal valve.   Cusp separation was  normal.  Doppler:  Transvalvular velocity was within the normal  range.  There was no evidence for stenosis. There was no  regurgitation.   -------------------------------------------------------------------  Tricuspid valve:   Structurally normal valve.   Leaflet separation  was normal.  Doppler:  Transvalvular velocity was within the normal  range. There was no regurgitation.   -------------------------------------------------------------------  Pulmonary artery:   The main pulmonary artery was normal-sized.  Systolic pressure was within the normal range.   -------------------------------------------------------------------  Right atrium:  The atrium was normal in size.   -------------------------------------------------------------------  Pericardium: The pericardium was normal in appearance. There was  no pericardial effusion.   -------------------------------------------------------------------  Systemic veins:  Inferior vena cava: The vessel was normal in size. The  respirophasic diameter changes were in the normal range (>= 50%),  consistent with normal central venous pressure.   -------------------------------------------------------------------  Post procedure conclusions  Ascending Aorta:   - The aorta was normal, not dilated, and non-diseased.   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    Gestational diabetes    Hypertension    Hypokalemia 11/02/2020   Irregular menstrual cycle    Irregular periods/menstrual cycles 05/03/2017   Will hold off on Provera or other fertility stuff for now, pt advised pregnancy is not recommended until BP under better control at least, plus other workup pen   Polycystic ovaries 11/09/2015    Proteinuria 02/28/2013   Stage 2 chronic kidney disease 04/27/2020   SVT (supraventricular tachycardia) 11/01/2020   Systolic murmur 07/16/2017   Tobacco abuse 02/28/2013   Uncontrolled hypertension 05/10/2017    Past Surgical History:  Procedure Laterality Date   NO PAST SURGERIES        OB History     Gravida  1   Para  1   Term      Preterm  1   AB      Living  1      SAB      IAB      Ectopic      Multiple  0   Live Births  1               Current Medications: Current Meds  Medication Sig   Accu-Chek Softclix Lancets lancets 100 each by Other route 4 (four) times daily. Use as instructed   albuterol (VENTOLIN HFA) 108 (90 Base) MCG/ACT inhaler Inhale 2 puffs into the lungs every 6 (six) hours as needed for wheezing or shortness of breath.   aspirin EC 81 MG tablet Take 2 tablets (162 mg total) by mouth daily. Swallow whole.   ferrous sulfate 325 (65 FE) MG tablet Take by mouth.   glucose blood test strip Use to check blood sugar 4 (four) times daily.   insulin aspart (NOVOLOG FLEXPEN) 100 UNIT/ML FlexPen Inject 5 Units into the skin with breakfast, with lunch, and with evening meal.   insulin glargine-yfgn (SEMGLEE) 100 UNIT/ML Pen Inject 7 Units into the skin 2 (two) times daily.   Insulin Pen Needle 32G X 4 MM MISC Use as directed subcutaneously.   ipratropium (ATROVENT) 0.06 % nasal spray Place 2 sprays into both nostrils 4 (four) times daily. As needed for runny nose / postnasal drip   labetalol (NORMODYNE) 200 MG tablet Take 4 tablets (800 mg total) by mouth every 8 (eight) hours.   metoCLOPramide (REGLAN)  10 MG tablet Take 1 tablet (10 mg total) by mouth every 6 (six) hours as needed for nausea.   NIFEdipine (ADALAT CC) 60 MG 24 hr tablet Take 1 tablet (60 mg total) by mouth in the morning and at bedtime.   ondansetron (ZOFRAN) 8 MG tablet Take 1 tablet (8 mg total) by mouth every 8 (eight) hours as needed for nausea or vomiting.   Prenatal  Vit-Fe Fumarate-FA (MULTIVITAMIN-PRENATAL) 27-0.8 MG TABS tablet Take 1 tablet by mouth daily at 12 noon.   [DISCONTINUED] acetaminophen (TYLENOL) 500 MG tablet Take 1,000 mg by mouth every 6 (six) hours as needed for mild pain.    [DISCONTINUED] hydrALAZINE (APRESOLINE) 25 MG tablet Take 1 tablet (25 mg total) by mouth every 6 (six) hours.   [DISCONTINUED] omeprazole (PRILOSEC) 20 MG capsule Take 1 capsule (20 mg total) by mouth daily.     Allergies:   Ibuprofen   Social History   Socioeconomic History   Marital status: Married    Spouse name: Ivar Drape   Number of children: 0   Years of education: Not on file   Highest education level: Not on file  Occupational History    Employer: UNEMPLOYED  Tobacco Use   Smoking status: Former    Current packs/day: 0.00    Average packs/day: 0.3 packs/day for 5.0 years (1.3 ttl pk-yrs)    Types: Cigarettes    Start date: 05/16/2016    Quit date: 05/16/2021    Years since quitting: 1.3   Smokeless tobacco: Never   Tobacco comments:    Quit smoking 2023-05-30after her mom passed away  Vaping Use   Vaping status: Never Used  Substance and Sexual Activity   Alcohol use: Not Currently    Comment: "occasional use; social drinker"   Drug use: No   Sexual activity: Yes    Partners: Male    Birth control/protection: None    Comment: Pregnant  Other Topics Concern   Not on file  Social History Narrative   Not on file   Social Determinants of Health   Financial Resource Strain: Low Risk  (05/08/2022)   Received from [facility], [facility]   Overall Financial Resource Strain (CARDIA)    Difficulty of Paying Living Expenses: Not very hard  Food Insecurity: No Food Insecurity (09/28/2022)   Hunger Vital Sign    Worried About Running Out of Food in the Last Year: Never true    Ran Out of Food in the Last Year: Never true  Transportation Needs: No Transportation Needs (09/28/2022)   PRAPARE - Administrator, Civil Service (Medical):  No    Lack of Transportation (Non-Medical): No  Physical Activity: Unknown (11/03/2020)   Received from [facility], [facility]   Exercise Vital Sign    Days of Exercise per Week: 5 days    Minutes of Exercise per Session: Patient declined  Stress: Stress Concern Present (11/03/2020)   Received from [facility], [facility] of Occupational Health - Occupational Stress Questionnaire    Feeling of Stress : To some extent  Social Connections: Unknown (05/10/2021)   Received from [facility], [facility]   Social Network    Social Network: Not on file      Family History  Problem Relation Age of Onset   Diabetes Mother    High blood pressure Mother    Renal Disease Mother    Diabetes Sister    Cancer Maternal Grandmother  Diabetes Maternal Grandmother    High blood pressure Maternal Grandmother    Cancer Maternal Aunt        breast   Other Father       ROS:   Please see the history of present illness.     All other systems reviewed and are negative.   Labs/EKG Reviewed:    EKG:   EKG is was not ordered today.    Recent Labs: 08/03/2022: TSH 1.010 09/29/2022: ALT 14; BUN 14; Creatinine, Ser 1.31; Magnesium 4.5; Potassium 4.3; Sodium 132 09/30/2022: Hemoglobin 9.1; Platelets 338   Recent Lipid Panel Lab Results  Component Value Date/Time   CHOL 240 (H) 11/15/2021 12:00 AM   TRIG 492 (H) 11/15/2021 12:00 AM   HDL 40 (L) 11/15/2021 12:00 AM   CHOLHDL 6.0 (H) 11/15/2021 12:00 AM   LDLCALC  11/15/2021 12:00 AM     Comment:     . LDL cholesterol not calculated. Triglyceride levels greater than 400 mg/dL invalidate calculated LDL results. . Reference range: <100 . Desirable range <100 mg/dL for primary prevention;   <70 mg/dL for patients with CHD or diabetic patients  with > or = 2 CHD risk factors. [name] LDL-C is now calculated using the Martin-Hopkins  calculation, which is a validated novel method providing  better accuracy than  the Friedewald equation in the  estimation of LDL-C.  Horald Pollen et al. Lenox Ahr. 2130;865(78): 2061-2068  (http://education.[employer].com/faq/FAQ164)     Physical Exam:    VS:  BP 128/64   Pulse 90   Ht 5\' 5"  (1.651 m)   Wt 199 lb 12.8 oz (90.6 kg)   LMP 01/18/2022   SpO2 90%   BMI 33.25 kg/m     Wt Readings from Last 3 Encounters:  09/28/22 196 lb (88.9 kg)  09/28/22 196 lb (88.9 kg)  09/27/22 196 lb 1.6 oz (89 kg)     GEN:  Well nourished, well developed in no acute distress HEENT: Normal NECK: No JVD; No carotid bruits LYMPHATICS: No lymphadenopathy CARDIAC: RRR, no murmurs, rubs, gallops RESPIRATORY:  Clear to auscultation without rales, wheezing or rhonchi  ABDOMEN: Soft, non-tender, non-distended MUSCULOSKELETAL:  No edema; No deformity  SKIN: Warm and dry NEUROLOGIC:  Alert and oriented x 3 PSYCHIATRIC:  Normal affect    Risk Assessment/Risk Calculators:                  ASSESSMENT & PLAN:    Chronic hypertension in pregnancy  Obesity  SVT Advanced maternal age  Blood pressure is at target, on her current regimen. But we need to continue to monitor her closely.I will see her back in 1 week.  Will continue hydralazine 25 mg every 6 hours, labetalol 800 mg every 8 hours, and nifedipine 60 mg twice daily.  Diabetes mellitus is being managed by the [facility] team as well as our cardio-OB pharmacist who has been following with the patient.   She is scheduled for induction on October 04, 2022.  The patient understands the need to lose weight with diet and exercise. We have discussed specific strategies for this.  Patient Instructions  Medication Instructions:  Your physician recommends that you continue on your current medications as directed. Please refer to the Current Medication list given to you today.  *If you need a refill on your cardiac medications before your next appointment, please call your pharmacy*   Lab  Work: None   Testing/Procedures: None   Follow-Up: At [facility], you and your health  needs are our priority.  As part of our continuing mission to provide you with exceptional heart care, we have created designated Provider Care Teams.  These Care Teams include your primary Cardiologist (physician) and Advanced Practice Providers (APPs -  Physician Assistants and Nurse Practitioners) who all work together to provide you with the care you need, when you need it.   Your next appointment:    October 2nd @ 11:30am  Provider:   Thomasene Ripple, DO     Dispo:  No follow-ups on file.   Medication Adjustments/Labs and Tests Ordered: Current medicines are reviewed at length with the patient today.  Concerns regarding medicines are outlined above.  Tests Ordered: No orders of the defined types were placed in this encounter.  Medication Changes: No orders of the defined types were placed in this encounter.

## 2022-09-27 ENCOUNTER — Other Ambulatory Visit (HOSPITAL_COMMUNITY): Payer: Self-pay

## 2022-09-27 ENCOUNTER — Encounter (HOSPITAL_COMMUNITY): Payer: Self-pay | Admitting: Obstetrics and Gynecology

## 2022-09-27 ENCOUNTER — Other Ambulatory Visit: Payer: Self-pay

## 2022-09-27 ENCOUNTER — Inpatient Hospital Stay (HOSPITAL_COMMUNITY)
Admission: AD | Admit: 2022-09-27 | Discharge: 2022-09-27 | Disposition: A | Discharge status: Home / Self Care | Payer: BC Managed Care – PPO | Attending: Obstetrics and Gynecology | Admitting: Obstetrics and Gynecology

## 2022-09-27 DIAGNOSIS — O99214 Obesity complicating childbirth: Secondary | ICD-10-CM | POA: Diagnosis not present

## 2022-09-27 DIAGNOSIS — O26833 Pregnancy related renal disease, third trimester: Secondary | ICD-10-CM | POA: Insufficient documentation

## 2022-09-27 DIAGNOSIS — Z833 Family history of diabetes mellitus: Secondary | ICD-10-CM | POA: Diagnosis not present

## 2022-09-27 DIAGNOSIS — Z87891 Personal history of nicotine dependence: Secondary | ICD-10-CM | POA: Diagnosis not present

## 2022-09-27 DIAGNOSIS — O114 Pre-existing hypertension with pre-eclampsia, complicating childbirth: Secondary | ICD-10-CM | POA: Diagnosis not present

## 2022-09-27 DIAGNOSIS — K920 Hematemesis: Secondary | ICD-10-CM | POA: Diagnosis not present

## 2022-09-27 DIAGNOSIS — O10913 Unspecified pre-existing hypertension complicating pregnancy, third trimester: Secondary | ICD-10-CM

## 2022-09-27 DIAGNOSIS — O9962 Diseases of the digestive system complicating childbirth: Secondary | ICD-10-CM | POA: Diagnosis not present

## 2022-09-27 DIAGNOSIS — Z794 Long term (current) use of insulin: Secondary | ICD-10-CM | POA: Insufficient documentation

## 2022-09-27 DIAGNOSIS — K219 Gastro-esophageal reflux disease without esophagitis: Secondary | ICD-10-CM | POA: Diagnosis not present

## 2022-09-27 DIAGNOSIS — Z3A36 36 weeks gestation of pregnancy: Secondary | ICD-10-CM | POA: Diagnosis not present

## 2022-09-27 DIAGNOSIS — N182 Chronic kidney disease, stage 2 (mild): Secondary | ICD-10-CM | POA: Diagnosis not present

## 2022-09-27 DIAGNOSIS — O26893 Other specified pregnancy related conditions, third trimester: Secondary | ICD-10-CM | POA: Insufficient documentation

## 2022-09-27 DIAGNOSIS — O24113 Pre-existing diabetes mellitus, type 2, in pregnancy, third trimester: Secondary | ICD-10-CM | POA: Insufficient documentation

## 2022-09-27 DIAGNOSIS — O10919 Unspecified pre-existing hypertension complicating pregnancy, unspecified trimester: Secondary | ICD-10-CM

## 2022-09-27 DIAGNOSIS — O3509X0 Maternal care for (suspected) other central nervous system malformation or damage in fetus, not applicable or unspecified: Secondary | ICD-10-CM | POA: Diagnosis not present

## 2022-09-27 DIAGNOSIS — O10213 Pre-existing hypertensive chronic kidney disease complicating pregnancy, third trimester: Secondary | ICD-10-CM | POA: Insufficient documentation

## 2022-09-27 DIAGNOSIS — O24424 Gestational diabetes mellitus in childbirth, insulin controlled: Secondary | ICD-10-CM | POA: Diagnosis not present

## 2022-09-27 DIAGNOSIS — O219 Vomiting of pregnancy, unspecified: Secondary | ICD-10-CM

## 2022-09-27 DIAGNOSIS — O99613 Diseases of the digestive system complicating pregnancy, third trimester: Secondary | ICD-10-CM | POA: Insufficient documentation

## 2022-09-27 DIAGNOSIS — O1414 Severe pre-eclampsia complicating childbirth: Secondary | ICD-10-CM | POA: Diagnosis not present

## 2022-09-27 DIAGNOSIS — I129 Hypertensive chronic kidney disease with stage 1 through stage 4 chronic kidney disease, or unspecified chronic kidney disease: Secondary | ICD-10-CM | POA: Diagnosis not present

## 2022-09-27 DIAGNOSIS — O411230 Chorioamnionitis, third trimester, not applicable or unspecified: Secondary | ICD-10-CM | POA: Diagnosis not present

## 2022-09-27 DIAGNOSIS — O1092 Unspecified pre-existing hypertension complicating childbirth: Secondary | ICD-10-CM | POA: Diagnosis not present

## 2022-09-27 LAB — COMPREHENSIVE METABOLIC PANEL WITH GFR
ALT: 14 U/L (ref 0–44)
AST: 20 U/L (ref 15–41)
Albumin: 2.5 g/dL — ABNORMAL LOW (ref 3.5–5.0)
Alkaline Phosphatase: 61 U/L (ref 38–126)
Anion gap: 10 (ref 5–15)
BUN: 12 mg/dL (ref 6–20)
CO2: 20 mmol/L — ABNORMAL LOW (ref 22–32)
Calcium: 9.1 mg/dL (ref 8.9–10.3)
Chloride: 105 mmol/L (ref 98–111)
Creatinine, Ser: 1.36 mg/dL — ABNORMAL HIGH (ref 0.44–1.00)
GFR, Estimated: 51 mL/min — ABNORMAL LOW (ref >60)
Glucose, Bld: 106 mg/dL — ABNORMAL HIGH (ref 70–99)
Potassium: 4.2 mmol/L (ref 3.5–5.1)
Sodium: 135 mmol/L (ref 135–145)
Total Bilirubin: 0.4 mg/dL (ref 0.3–1.2)
Total Protein: 6.4 g/dL — ABNORMAL LOW (ref 6.5–8.1)

## 2022-09-27 LAB — CBC
HCT: 31.4 % — ABNORMAL LOW (ref 36.0–46.0)
Hemoglobin: 10.5 g/dL — ABNORMAL LOW (ref 12.0–15.0)
MCH: 29.7 pg (ref 26.0–34.0)
MCHC: 33.4 g/dL (ref 30.0–36.0)
MCV: 89 fL (ref 80.0–100.0)
Platelets: 363 10*3/uL (ref 150–400)
RBC: 3.53 MIL/uL — ABNORMAL LOW (ref 3.87–5.11)
RDW: 14.3 % (ref 11.5–15.5)
WBC: 10.9 10*3/uL — ABNORMAL HIGH (ref 4.0–10.5)
nRBC: 0 % (ref 0.0–0.2)

## 2022-09-27 LAB — URINALYSIS, ROUTINE W REFLEX MICROSCOPIC
Bilirubin Urine: NEGATIVE
Glucose, UA: NEGATIVE mg/dL
Hgb urine dipstick: NEGATIVE
Ketones, ur: NEGATIVE mg/dL
Nitrite: NEGATIVE
Protein, ur: 100 mg/dL — AB
Specific Gravity, Urine: >1.03 — ABNORMAL HIGH (ref 1.005–1.030)
pH: 6 (ref 5.0–8.0)

## 2022-09-27 LAB — URINALYSIS, MICROSCOPIC (REFLEX)

## 2022-09-27 LAB — PROTEIN / CREATININE RATIO, URINE
Creatinine, Urine: 147 mg/dL
Protein Creatinine Ratio: 0.76 mg/mg{creat} — ABNORMAL HIGH (ref 0.00–0.15)
Total Protein, Urine: 111 mg/dL

## 2022-09-27 MED ORDER — OMEPRAZOLE 40 MG PO CPDR
40.0000 mg | DELAYED_RELEASE_CAPSULE | Freq: Every day | ORAL | 0 refills | Status: DC
  Filled 2022-09-27 (×2): qty 90, 90d supply, fill #0

## 2022-09-27 MED ORDER — PANTOPRAZOLE SODIUM 40 MG PO TBEC
80.0000 mg | DELAYED_RELEASE_TABLET | Freq: Once | ORAL | Status: AC
  Administered 2022-09-27: 80 mg via ORAL
  Filled 2022-09-27: qty 2

## 2022-09-27 MED ORDER — ONDANSETRON 4 MG PO TBDP
4.0000 mg | ORAL_TABLET | Freq: Once | ORAL | Status: AC
  Administered 2022-09-27: 4 mg via ORAL
  Filled 2022-09-27: qty 1

## 2022-09-27 MED ORDER — SUCRALFATE 1 GM/10ML PO SUSP
1.0000 g | Freq: Once | ORAL | Status: AC
  Administered 2022-09-27: 1 g via ORAL
  Filled 2022-09-27: qty 10

## 2022-09-27 MED ORDER — SUCRALFATE 1 G PO TABS
1.0000 g | ORAL_TABLET | Freq: Three times a day (TID) | ORAL | 1 refills | Status: DC
  Filled 2022-09-27 (×2): qty 120, 30d supply, fill #0

## 2022-09-27 MED ORDER — LIDOCAINE VISCOUS HCL 2 % MT SOLN
15.0000 mL | Freq: Once | OROMUCOSAL | Status: AC
  Administered 2022-09-27: 15 mL via OROMUCOSAL
  Filled 2022-09-27: qty 15

## 2022-09-27 NOTE — Discharge Instructions (Signed)
Please dissolve the Carafate tablets in water after crushing.

## 2022-09-27 NOTE — Progress Notes (Unsigned)
PRENATAL VISIT NOTE  Subjective:  Selena Meyer is a 37 y.o. G1P0 at [redacted]w[redacted]d being seen today for ongoing prenatal care.  She is currently monitored for the following issues for this high-risk pregnancy and has FSGS (focal segmental glomerulosclerosis); SVT (supraventricular tachycardia); IDA (iron deficiency anemia); Supervision of high risk pregnancy, antepartum; Gestational diabetes mellitus (GDM), antepartum; AMA (advanced maternal age) primigravida 58+; Chronic hypertension during pregnancy, antepartum; Kidney disease in pregnancy, unspecified trimester; Morbid obesity (HCC); Moderate left ventricular hypertrophy; Known fetal anomaly, antepartum; Hypertensive crisis; and Hypertension affecting pregnancy on their problem list.  Patient reports {[id number]}.   .  .   . Denies leaking of fluid.   The following portions of the patient's history were reviewed and updated as appropriate: allergies, current medications, past family history, past medical history, past social history, past surgical history and problem list.   Objective:  There were no vitals filed for this visit.  Fetal Status:           General:  Alert, oriented and cooperative. Patient is in no acute distress.  Skin: Skin is warm and dry. No rash noted.   Cardiovascular: Normal heart rate noted  Respiratory: Normal respiratory effort, no problems with respiration noted  Abdomen: Soft, gravid, appropriate for gestational age.        Pelvic: Cervical exam performed in the presence of a chaperone        Extremities: Normal range of motion.     Mental Status: Normal mood and affect. Normal behavior. Normal judgment and thought content.   Assessment and Plan:  Pregnancy: G1P0 at [redacted]w[redacted]d 1. Chronic hypertension during pregnancy, antepartum BP today *** on Labetalol 800 TID, nifediine 60/30, hydralazine 25q6 Denies symptoms Labs 9/18 normal for her - Cr 1.36. LFTs wnl. Platelets 363.  2x a week antenatal testing, BPP tomorrow 8/21  growth normal - 21%ile.  BPP 9/20. NST today *** IOL 9/25  2. Moderate left ventricular hypertrophy Normal EF Next appt with Dr. Servando Salina is 10/2.   3. Insulin controlled gestational diabetes mellitus (GDM) during pregnancy, antepartum Reports normal fasting and pp with outliers depending what she eats - *** On semglee 7u BID and novolog 5u TID  4. Kidney disease in pregnancy, unspecified trimester P/C ratio within range from prior testing.   5. Supervision of high risk pregnancy, antepartum GBS collected ***  6. Known fetal anomaly, antepartum, single or unspecified fetus Fetal ventriculomegaly Fetal brain MRI 8/27 "hypoplastic cerebellar vermis. No discrete associated parenchymal abnormality. Otherwise normal appearance of the brain, head and neck, spine and imaged fetal body for gestational age"  66. Iron deficiency anemia, unspecified iron deficiency anemia type HgB 10.5.    8. Primigravida of advanced maternal age in second trimester   Preterm labor symptoms and general obstetric precautions including but not limited to vaginal bleeding, contractions, leaking of fluid and fetal movement were reviewed in detail with the patient. Please refer to After Visit Summary for other counseling recommendations.   No follow-ups on file.  Future Appointments  Date Time Provider Department Center  09/28/2022  3:30 PM [facility] NURSE [facility]  09/28/2022  3:30 PM Milas Hock, MD [facility]  09/29/2022  1:15 PM [facility] NURSE [facility]  09/29/2022  1:30 PM [facility] US5 [facility]  10/04/2022  6:30 AM [facility] SCHED ROOM [facility] None  10/11/2022 11:30 AM Tobb, Kardie, DO [facility] None  10/16/2022  8:20 AM Tobb, Kardie, DO [facility] None  11/23/2022 11:00 AM [facility] LAB [facility] None  11/23/2022  11:15 AM Rushie Chestnut, PA-C [facility] None    Milas Hock, MD

## 2022-09-27 NOTE — MAU Note (Signed)
.  Selena Meyer is a 37 y.o. at [redacted]w[redacted]d here in MAU reporting: she started feeling nauseous out of no where this morning and began throwing up. She noticed some blood when she threw up - she reports seeing blood with her "stomach acid". She denies emesis contents being streaked with blood or frothy pink blood and reports that is is bright red. She has also not felt any FM since 0500 this AM even with attempts to promote.  PIH Assessment: Headache present: No  Visual disturbances: None RUQ pain/Epigastric: None Atypical edema: None Hx of HBP: CHTN BP Medications: Nifedipine 60 mg BID, Labetalol 200 mg q8 hrs. Next dose of Labetalol @1400 .   Denies VB or LOF.   Onset of complaint: This AM Pain score: Denies pain Vitals:   09/27/22 1018  BP: (!) 155/94  Pulse: 89  Resp: 16  Temp: 98 F (36.7 C)  SpO2: 99%     FHT:150 Lab orders placed from triage:  UA

## 2022-09-27 NOTE — MAU Provider Note (Signed)
Date:  01/18/22                  EDD:   10/25/22  Best:          Selena Meyer 3d     Det. By:  LMP  (01/18/22)          EDD:   10/25/22 ---------------------------------------------------------------------- Anatomy  Cranium:               Appears normal         Diaphragm:              Appears normal  Ventricles:            Bilat.ventriculo,      Stomach:                Appears normal, left                         Rt=16 Lt=13                                                                        sided  Choroid Plexus:        Dangling Choroid       Kidneys:                Appear normal  Thoracic:              Appears normal         Bladder:                Appears normal ---------------------------------------------------------------------- Cervix Uterus Adnexa  Cervix  Not visualized (advanced GA >24wks)  Uterus  No abnormality visualized.  Right Ovary  Not visualized.  Left Ovary  Not visualized.  Cul De Sac  No free fluid seen.  Adnexa  No abnormality visualized ---------------------------------------------------------------------- Comments  This patient was sent to the MAU due to a nonreactive NST  noted in the office.  Her pregnancy has been complicated by  chronic kidney disease and gestational diabetes treated with  insulin.  A biophysical profile performed today was 8/8.  The AFI was 13.9 cm (within normal limits).  Bilateral ventriculomegaly continues to be noted in the fetal  brain.  The patient has a fetal MRI scheduled at [facility] on  September 05, 2022. ----------------------------------------------------------------------                   Selena Rings, MD Electronically Signed Final Report   09/05/2022 04:23 pm ----------------------------------------------------------------------   [place] MFM OB FOLLOW UP  Result Date: 08/30/2022 ----------------------------------------------------------------------  OBSTETRICS REPORT                       (Signed Final 08/30/2022 02:44 pm) ---------------------------------------------------------------------- Patient Info  ID #:       [id number]                          D.O.B.:  01/03/1986 (37 yrs)  Name:       Selena Meyer                   Visit Date: 08/30/2022 07:52 am ----------------------------------------------------------------------  D.O.B.:  [birth year] (37 yrs)  Name:       Selena Meyer                   Visit Date: 09/13/2022 03:12 pm ---------------------------------------------------------------------- Performed By  Attending:        Noralee Space MD        Ref. Address:     [address] [place]  Performed By:     Alain Marion     Secondary Phy.:   [facility] MAU/Triage                    RDMS  Referred By:      Everardo All       [facility] ---------------------------------------------------------------------- Orders  #  Description                           Code        Ordered By  1  [place] MFM FETAL BPP WO NON               76819.01    Mariel Aloe     STRESS ----------------------------------------------------------------------  #  Order #                     Accession #                Episode #  1  [id number]                   [id number]                 [id number] ---------------------------------------------------------------------- Indications  Pre-eclampsia                                  O14.90  Fetal ventriculomegaly  D.O.B.:  [birth year] (37 yrs)  Name:       Selena Meyer                   Visit Date: 09/13/2022 03:12 pm ---------------------------------------------------------------------- Performed By  Attending:        Noralee Space MD        Ref. Address:     [address] [place]  Performed By:     Alain Marion     Secondary Phy.:   [facility] MAU/Triage                    RDMS  Referred By:      Everardo All       Location[facility]         [facility] ---------------------------------------------------------------------- Orders  #  Description                           Code        Ordered By  1  [place] MFM FETAL BPP WO NON               76819.01    Mariel Aloe     STRESS ----------------------------------------------------------------------  #  Order #                     Accession #                Episode #  1  [id number]                   [id number]                 [id number] ---------------------------------------------------------------------- Indications  Pre-eclampsia                                  O14.90  Fetal ventriculomegaly  D.O.B.:  [birth year] (37 yrs)  Name:       Selena Meyer                   Visit Date: 09/13/2022 03:12 pm ---------------------------------------------------------------------- Performed By  Attending:        Noralee Space MD        Ref. Address:     [address] [place]  Performed By:     Alain Marion     Secondary Phy.:   [facility] MAU/Triage                    RDMS  Referred By:      Everardo All       [facility] ---------------------------------------------------------------------- Orders  #  Description                           Code        Ordered By  1  [place] MFM FETAL BPP WO NON               76819.01    Mariel Aloe     STRESS ----------------------------------------------------------------------  #  Order #                     Accession #                Episode #  1  [id number]                   [id number]                 [id number] ---------------------------------------------------------------------- Indications  Pre-eclampsia                                  O14.90  Fetal ventriculomegaly  Date:  01/18/22                  EDD:   10/25/22  Best:          Selena Meyer 5d     Det. By:  LMP  (01/18/22)          EDD:   10/25/22 ---------------------------------------------------------------------- Anatomy  Cranium:               Appears normal         Diaphragm:              Appears normal  Ventricles:            Bilat.ventriculo,      Stomach:                Appears normal, left                         Rt=16 Lt=13                                                                        sided  Choroid Plexus:        Dangling Choroid       Kidneys:                Appear normal  Thoracic:              Appears normal         Bladder:                Appears normal ---------------------------------------------------------------------- Cervix Uterus Adnexa  Cervix  Not visualized (advanced GA >24wks)  Uterus  No abnormality visualized.  Right Ovary  Not visualized.  Left Ovary  Not visualized.  Cul De Sac  No free fluid seen.  Adnexa  No abnormality visualized ---------------------------------------------------------------------- Comments  This patient was sent to the MAU due to a nonreactive NST  noted in the office.  Her pregnancy has been complicated by  chronic kidney disease and gestational diabetes treated with  insulin.  A biophysical profile performed today was 8/8.  The AFI was 13.9 cm (within normal limits).  Bilateral ventriculomegaly continues to be noted in the fetal  brain.  The patient has a fetal MRI scheduled at [facility] on  September 05, 2022. ----------------------------------------------------------------------                   Selena Rings, MD Electronically Signed Final Report   09/05/2022 04:23 pm ----------------------------------------------------------------------   [place] MFM OB FOLLOW UP  Result Date: 08/30/2022 ----------------------------------------------------------------------  OBSTETRICS REPORT                       (Signed Final 08/30/2022 02:44 pm) ---------------------------------------------------------------------- Patient Info  ID #:       [id number]                          D.O.B.:  01/03/1986 (37 yrs)  Name:       Selena Meyer                   Visit Date: 08/30/2022 07:52 am ----------------------------------------------------------------------  D.O.B.:  [birth year] (37 yrs)  Name:       Selena Meyer                   Visit Date: 09/13/2022 03:12 pm ---------------------------------------------------------------------- Performed By  Attending:        Noralee Space MD        Ref. Address:     [address] [place]  Performed By:     Alain Marion     Secondary Phy.:   [facility] MAU/Triage                    RDMS  Referred By:      Everardo All       [facility] ---------------------------------------------------------------------- Orders  #  Description                           Code        Ordered By  1  [place] MFM FETAL BPP WO NON               76819.01    Mariel Aloe     STRESS ----------------------------------------------------------------------  #  Order #                     Accession #                Episode #  1  [id number]                   [id number]                 [id number] ---------------------------------------------------------------------- Indications  Pre-eclampsia                                  O14.90  Fetal ventriculomegaly  fetal brain ----------------------------------------------------------------------                  Braxton Feathers, DO Electronically Signed Final Report   08/30/2022 02:44 pm ----------------------------------------------------------------------   [place] MFM FETAL BPP WO NON STRESS  Result Date: 08/30/2022 ----------------------------------------------------------------------  OBSTETRICS REPORT                       (Signed Final 08/30/2022 02:44 pm) ---------------------------------------------------------------------- Patient Info  ID #:       [id number]                           D.O.B.:  December 06, 1985 (37 yrs)  Name:       Selena Meyer                   Visit Date: 08/30/2022 07:52 am ---------------------------------------------------------------------- Performed By  Attending:        Braxton Feathers DO       Ref. Address:     [address] [place]  Performed By:     Percell Boston          Secondary Phy.:   [facility]                    RDMS                                                             Care  Referred By:      [facility]       [facility] ---------------------------------------------------------------------- Orders  #  Description                           Code        Ordered By  1  [place] MFM OB FOLLOW UP                   [phone]    Milas Hock  2  [place] MFM FETAL BPP WO NON               29518.84    PAULA DUNCAN     STRESS ----------------------------------------------------------------------  #  Order #                     Accession #                Episode #  1  [id number]  Date:  01/18/22                  EDD:   10/25/22  Best:          Selena Meyer 5d     Det. By:  LMP  (01/18/22)          EDD:   10/25/22 ---------------------------------------------------------------------- Anatomy  Cranium:               Appears normal         Diaphragm:              Appears normal  Ventricles:            Bilat.ventriculo,      Stomach:                Appears normal, left                         Rt=16 Lt=13                                                                        sided  Choroid Plexus:        Dangling Choroid       Kidneys:                Appear normal  Thoracic:              Appears normal         Bladder:                Appears normal ---------------------------------------------------------------------- Cervix Uterus Adnexa  Cervix  Not visualized (advanced GA >24wks)  Uterus  No abnormality visualized.  Right Ovary  Not visualized.  Left Ovary  Not visualized.  Cul De Sac  No free fluid seen.  Adnexa  No abnormality visualized ---------------------------------------------------------------------- Comments  This patient was sent to the MAU due to a nonreactive NST  noted in the office.  Her pregnancy has been complicated by  chronic kidney disease and gestational diabetes treated with  insulin.  A biophysical profile performed today was 8/8.  The AFI was 13.9 cm (within normal limits).  Bilateral ventriculomegaly continues to be noted in the fetal  brain.  The patient has a fetal MRI scheduled at [facility] on  September 05, 2022. ----------------------------------------------------------------------                   Selena Rings, MD Electronically Signed Final Report   09/05/2022 04:23 pm ----------------------------------------------------------------------   [place] MFM OB FOLLOW UP  Result Date: 08/30/2022 ----------------------------------------------------------------------  OBSTETRICS REPORT                       (Signed Final 08/30/2022 02:44 pm) ---------------------------------------------------------------------- Patient Info  ID #:       [id number]                          D.O.B.:  01/03/1986 (37 yrs)  Name:       Selena Meyer                   Visit Date: 08/30/2022 07:52 am ----------------------------------------------------------------------  Date:  01/18/22                  EDD:   10/25/22  Best:          Selena Meyer 5d     Det. By:  LMP  (01/18/22)          EDD:   10/25/22 ---------------------------------------------------------------------- Anatomy  Cranium:               Appears normal         Diaphragm:              Appears normal  Ventricles:            Bilat.ventriculo,      Stomach:                Appears normal, left                         Rt=16 Lt=13                                                                        sided  Choroid Plexus:        Dangling Choroid       Kidneys:                Appear normal  Thoracic:              Appears normal         Bladder:                Appears normal ---------------------------------------------------------------------- Cervix Uterus Adnexa  Cervix  Not visualized (advanced GA >24wks)  Uterus  No abnormality visualized.  Right Ovary  Not visualized.  Left Ovary  Not visualized.  Cul De Sac  No free fluid seen.  Adnexa  No abnormality visualized ---------------------------------------------------------------------- Comments  This patient was sent to the MAU due to a nonreactive NST  noted in the office.  Her pregnancy has been complicated by  chronic kidney disease and gestational diabetes treated with  insulin.  A biophysical profile performed today was 8/8.  The AFI was 13.9 cm (within normal limits).  Bilateral ventriculomegaly continues to be noted in the fetal  brain.  The patient has a fetal MRI scheduled at [facility] on  September 05, 2022. ----------------------------------------------------------------------                   Selena Rings, MD Electronically Signed Final Report   09/05/2022 04:23 pm ----------------------------------------------------------------------   [place] MFM OB FOLLOW UP  Result Date: 08/30/2022 ----------------------------------------------------------------------  OBSTETRICS REPORT                       (Signed Final 08/30/2022 02:44 pm) ---------------------------------------------------------------------- Patient Info  ID #:       [id number]                          D.O.B.:  01/03/1986 (37 yrs)  Name:       Selena Meyer                   Visit Date: 08/30/2022 07:52 am ----------------------------------------------------------------------  Date:  01/18/22                  EDD:   10/25/22  Best:          Selena Meyer 5d     Det. By:  LMP  (01/18/22)          EDD:   10/25/22 ---------------------------------------------------------------------- Anatomy  Cranium:               Appears normal         Diaphragm:              Appears normal  Ventricles:            Bilat.ventriculo,      Stomach:                Appears normal, left                         Rt=16 Lt=13                                                                        sided  Choroid Plexus:        Dangling Choroid       Kidneys:                Appear normal  Thoracic:              Appears normal         Bladder:                Appears normal ---------------------------------------------------------------------- Cervix Uterus Adnexa  Cervix  Not visualized (advanced GA >24wks)  Uterus  No abnormality visualized.  Right Ovary  Not visualized.  Left Ovary  Not visualized.  Cul De Sac  No free fluid seen.  Adnexa  No abnormality visualized ---------------------------------------------------------------------- Comments  This patient was sent to the MAU due to a nonreactive NST  noted in the office.  Her pregnancy has been complicated by  chronic kidney disease and gestational diabetes treated with  insulin.  A biophysical profile performed today was 8/8.  The AFI was 13.9 cm (within normal limits).  Bilateral ventriculomegaly continues to be noted in the fetal  brain.  The patient has a fetal MRI scheduled at [facility] on  September 05, 2022. ----------------------------------------------------------------------                   Selena Rings, MD Electronically Signed Final Report   09/05/2022 04:23 pm ----------------------------------------------------------------------   [place] MFM OB FOLLOW UP  Result Date: 08/30/2022 ----------------------------------------------------------------------  OBSTETRICS REPORT                       (Signed Final 08/30/2022 02:44 pm) ---------------------------------------------------------------------- Patient Info  ID #:       [id number]                          D.O.B.:  01/03/1986 (37 yrs)  Name:       Selena Meyer                   Visit Date: 08/30/2022 07:52 am ----------------------------------------------------------------------  fetal brain ----------------------------------------------------------------------                  Braxton Feathers, DO Electronically Signed Final Report   08/30/2022 02:44 pm ----------------------------------------------------------------------   [place] MFM FETAL BPP WO NON STRESS  Result Date: 08/30/2022 ----------------------------------------------------------------------  OBSTETRICS REPORT                       (Signed Final 08/30/2022 02:44 pm) ---------------------------------------------------------------------- Patient Info  ID #:       [id number]                           D.O.B.:  December 06, 1985 (37 yrs)  Name:       Selena Meyer                   Visit Date: 08/30/2022 07:52 am ---------------------------------------------------------------------- Performed By  Attending:        Braxton Feathers DO       Ref. Address:     [address] [place]  Performed By:     Percell Boston          Secondary Phy.:   [facility]                    RDMS                                                             Care  Referred By:      [facility]       [facility] ---------------------------------------------------------------------- Orders  #  Description                           Code        Ordered By  1  [place] MFM OB FOLLOW UP                   [phone]    Milas Hock  2  [place] MFM FETAL BPP WO NON               29518.84    PAULA DUNCAN     STRESS ----------------------------------------------------------------------  #  Order #                     Accession #                Episode #  1  [id number]  D.O.B.:  [birth year] (37 yrs)  Name:       Selena Meyer                   Visit Date: 09/13/2022 03:12 pm ---------------------------------------------------------------------- Performed By  Attending:        Noralee Space MD        Ref. Address:     [address] [place]  Performed By:     Alain Marion     Secondary Phy.:   [facility] MAU/Triage                    RDMS  Referred By:      Everardo All       [facility] ---------------------------------------------------------------------- Orders  #  Description                           Code        Ordered By  1  [place] MFM FETAL BPP WO NON               76819.01    Mariel Aloe     STRESS ----------------------------------------------------------------------  #  Order #                     Accession #                Episode #  1  [id number]                   [id number]                 [id number] ---------------------------------------------------------------------- Indications  Pre-eclampsia                                  O14.90  Fetal ventriculomegaly  Date:  01/18/22                  EDD:   10/25/22  Best:          Selena Meyer 5d     Det. By:  LMP  (01/18/22)          EDD:   10/25/22 ---------------------------------------------------------------------- Anatomy  Cranium:               Appears normal         Diaphragm:              Appears normal  Ventricles:            Bilat.ventriculo,      Stomach:                Appears normal, left                         Rt=16 Lt=13                                                                        sided  Choroid Plexus:        Dangling Choroid       Kidneys:                Appear normal  Thoracic:              Appears normal         Bladder:                Appears normal ---------------------------------------------------------------------- Cervix Uterus Adnexa  Cervix  Not visualized (advanced GA >24wks)  Uterus  No abnormality visualized.  Right Ovary  Not visualized.  Left Ovary  Not visualized.  Cul De Sac  No free fluid seen.  Adnexa  No abnormality visualized ---------------------------------------------------------------------- Comments  This patient was sent to the MAU due to a nonreactive NST  noted in the office.  Her pregnancy has been complicated by  chronic kidney disease and gestational diabetes treated with  insulin.  A biophysical profile performed today was 8/8.  The AFI was 13.9 cm (within normal limits).  Bilateral ventriculomegaly continues to be noted in the fetal  brain.  The patient has a fetal MRI scheduled at [facility] on  September 05, 2022. ----------------------------------------------------------------------                   Selena Rings, MD Electronically Signed Final Report   09/05/2022 04:23 pm ----------------------------------------------------------------------   [place] MFM OB FOLLOW UP  Result Date: 08/30/2022 ----------------------------------------------------------------------  OBSTETRICS REPORT                       (Signed Final 08/30/2022 02:44 pm) ---------------------------------------------------------------------- Patient Info  ID #:       [id number]                          D.O.B.:  01/03/1986 (37 yrs)  Name:       Selena Meyer                   Visit Date: 08/30/2022 07:52 am ----------------------------------------------------------------------  Date:  01/18/22                  EDD:   10/25/22  Best:          Selena Meyer 5d     Det. By:  LMP  (01/18/22)          EDD:   10/25/22 ---------------------------------------------------------------------- Anatomy  Cranium:               Appears normal         Diaphragm:              Appears normal  Ventricles:            Bilat.ventriculo,      Stomach:                Appears normal, left                         Rt=16 Lt=13                                                                        sided  Choroid Plexus:        Dangling Choroid       Kidneys:                Appear normal  Thoracic:              Appears normal         Bladder:                Appears normal ---------------------------------------------------------------------- Cervix Uterus Adnexa  Cervix  Not visualized (advanced GA >24wks)  Uterus  No abnormality visualized.  Right Ovary  Not visualized.  Left Ovary  Not visualized.  Cul De Sac  No free fluid seen.  Adnexa  No abnormality visualized ---------------------------------------------------------------------- Comments  This patient was sent to the MAU due to a nonreactive NST  noted in the office.  Her pregnancy has been complicated by  chronic kidney disease and gestational diabetes treated with  insulin.  A biophysical profile performed today was 8/8.  The AFI was 13.9 cm (within normal limits).  Bilateral ventriculomegaly continues to be noted in the fetal  brain.  The patient has a fetal MRI scheduled at [facility] on  September 05, 2022. ----------------------------------------------------------------------                   Selena Rings, MD Electronically Signed Final Report   09/05/2022 04:23 pm ----------------------------------------------------------------------   [place] MFM OB FOLLOW UP  Result Date: 08/30/2022 ----------------------------------------------------------------------  OBSTETRICS REPORT                       (Signed Final 08/30/2022 02:44 pm) ---------------------------------------------------------------------- Patient Info  ID #:       [id number]                          D.O.B.:  01/03/1986 (37 yrs)  Name:       Selena Meyer                   Visit Date: 08/30/2022 07:52 am ----------------------------------------------------------------------  fetal brain ----------------------------------------------------------------------                  Braxton Feathers, DO Electronically Signed Final Report   08/30/2022 02:44 pm ----------------------------------------------------------------------   [place] MFM FETAL BPP WO NON STRESS  Result Date: 08/30/2022 ----------------------------------------------------------------------  OBSTETRICS REPORT                       (Signed Final 08/30/2022 02:44 pm) ---------------------------------------------------------------------- Patient Info  ID #:       [id number]                           D.O.B.:  December 06, 1985 (37 yrs)  Name:       Selena Meyer                   Visit Date: 08/30/2022 07:52 am ---------------------------------------------------------------------- Performed By  Attending:        Braxton Feathers DO       Ref. Address:     [address] [place]  Performed By:     Percell Boston          Secondary Phy.:   [facility]                    RDMS                                                             Care  Referred By:      [facility]       [facility] ---------------------------------------------------------------------- Orders  #  Description                           Code        Ordered By  1  [place] MFM OB FOLLOW UP                   [phone]    Milas Hock  2  [place] MFM FETAL BPP WO NON               29518.84    PAULA DUNCAN     STRESS ----------------------------------------------------------------------  #  Order #                     Accession #                Episode #  1  [id number]  fetal brain ----------------------------------------------------------------------                  Braxton Feathers, DO Electronically Signed Final Report   08/30/2022 02:44 pm ----------------------------------------------------------------------   [place] MFM FETAL BPP WO NON STRESS  Result Date: 08/30/2022 ----------------------------------------------------------------------  OBSTETRICS REPORT                       (Signed Final 08/30/2022 02:44 pm) ---------------------------------------------------------------------- Patient Info  ID #:       [id number]                           D.O.B.:  December 06, 1985 (37 yrs)  Name:       Selena Meyer                   Visit Date: 08/30/2022 07:52 am ---------------------------------------------------------------------- Performed By  Attending:        Braxton Feathers DO       Ref. Address:     [address] [place]  Performed By:     Percell Boston          Secondary Phy.:   [facility]                    RDMS                                                             Care  Referred By:      [facility]       [facility] ---------------------------------------------------------------------- Orders  #  Description                           Code        Ordered By  1  [place] MFM OB FOLLOW UP                   [phone]    Milas Hock  2  [place] MFM FETAL BPP WO NON               29518.84    PAULA DUNCAN     STRESS ----------------------------------------------------------------------  #  Order #                     Accession #                Episode #  1  [id number]  D.O.B.:  [birth year] (37 yrs)  Name:       Selena Meyer                   Visit Date: 09/13/2022 03:12 pm ---------------------------------------------------------------------- Performed By  Attending:        Noralee Space MD        Ref. Address:     [address] [place]  Performed By:     Alain Marion     Secondary Phy.:   [facility] MAU/Triage                    RDMS  Referred By:      Everardo All       [facility] ---------------------------------------------------------------------- Orders  #  Description                           Code        Ordered By  1  [place] MFM FETAL BPP WO NON               76819.01    Mariel Aloe     STRESS ----------------------------------------------------------------------  #  Order #                     Accession #                Episode #  1  [id number]                   [id number]                 [id number] ---------------------------------------------------------------------- Indications  Pre-eclampsia                                  O14.90  Fetal ventriculomegaly  fetal brain ----------------------------------------------------------------------                  Braxton Feathers, DO Electronically Signed Final Report   08/30/2022 02:44 pm ----------------------------------------------------------------------   [place] MFM FETAL BPP WO NON STRESS  Result Date: 08/30/2022 ----------------------------------------------------------------------  OBSTETRICS REPORT                       (Signed Final 08/30/2022 02:44 pm) ---------------------------------------------------------------------- Patient Info  ID #:       [id number]                           D.O.B.:  December 06, 1985 (37 yrs)  Name:       Selena Meyer                   Visit Date: 08/30/2022 07:52 am ---------------------------------------------------------------------- Performed By  Attending:        Braxton Feathers DO       Ref. Address:     [address] [place]  Performed By:     Percell Boston          Secondary Phy.:   [facility]                    RDMS                                                             Care  Referred By:      [facility]       [facility] ---------------------------------------------------------------------- Orders  #  Description                           Code        Ordered By  1  [place] MFM OB FOLLOW UP                   [phone]    Milas Hock  2  [place] MFM FETAL BPP WO NON               29518.84    PAULA DUNCAN     STRESS ----------------------------------------------------------------------  #  Order #                     Accession #                Episode #  1  [id number]  Date:  01/18/22                  EDD:   10/25/22  Best:          Selena Meyer 5d     Det. By:  LMP  (01/18/22)          EDD:   10/25/22 ---------------------------------------------------------------------- Anatomy  Cranium:               Appears normal         Diaphragm:              Appears normal  Ventricles:            Bilat.ventriculo,      Stomach:                Appears normal, left                         Rt=16 Lt=13                                                                        sided  Choroid Plexus:        Dangling Choroid       Kidneys:                Appear normal  Thoracic:              Appears normal         Bladder:                Appears normal ---------------------------------------------------------------------- Cervix Uterus Adnexa  Cervix  Not visualized (advanced GA >24wks)  Uterus  No abnormality visualized.  Right Ovary  Not visualized.  Left Ovary  Not visualized.  Cul De Sac  No free fluid seen.  Adnexa  No abnormality visualized ---------------------------------------------------------------------- Comments  This patient was sent to the MAU due to a nonreactive NST  noted in the office.  Her pregnancy has been complicated by  chronic kidney disease and gestational diabetes treated with  insulin.  A biophysical profile performed today was 8/8.  The AFI was 13.9 cm (within normal limits).  Bilateral ventriculomegaly continues to be noted in the fetal  brain.  The patient has a fetal MRI scheduled at [facility] on  September 05, 2022. ----------------------------------------------------------------------                   Selena Rings, MD Electronically Signed Final Report   09/05/2022 04:23 pm ----------------------------------------------------------------------   [place] MFM OB FOLLOW UP  Result Date: 08/30/2022 ----------------------------------------------------------------------  OBSTETRICS REPORT                       (Signed Final 08/30/2022 02:44 pm) ---------------------------------------------------------------------- Patient Info  ID #:       [id number]                          D.O.B.:  01/03/1986 (37 yrs)  Name:       Selena Meyer                   Visit Date: 08/30/2022 07:52 am ----------------------------------------------------------------------  Date:  01/18/22                  EDD:   10/25/22  Best:          Selena Meyer 5d     Det. By:  LMP  (01/18/22)          EDD:   10/25/22 ---------------------------------------------------------------------- Anatomy  Cranium:               Appears normal         Diaphragm:              Appears normal  Ventricles:            Bilat.ventriculo,      Stomach:                Appears normal, left                         Rt=16 Lt=13                                                                        sided  Choroid Plexus:        Dangling Choroid       Kidneys:                Appear normal  Thoracic:              Appears normal         Bladder:                Appears normal ---------------------------------------------------------------------- Cervix Uterus Adnexa  Cervix  Not visualized (advanced GA >24wks)  Uterus  No abnormality visualized.  Right Ovary  Not visualized.  Left Ovary  Not visualized.  Cul De Sac  No free fluid seen.  Adnexa  No abnormality visualized ---------------------------------------------------------------------- Comments  This patient was sent to the MAU due to a nonreactive NST  noted in the office.  Her pregnancy has been complicated by  chronic kidney disease and gestational diabetes treated with  insulin.  A biophysical profile performed today was 8/8.  The AFI was 13.9 cm (within normal limits).  Bilateral ventriculomegaly continues to be noted in the fetal  brain.  The patient has a fetal MRI scheduled at [facility] on  September 05, 2022. ----------------------------------------------------------------------                   Selena Rings, MD Electronically Signed Final Report   09/05/2022 04:23 pm ----------------------------------------------------------------------   [place] MFM OB FOLLOW UP  Result Date: 08/30/2022 ----------------------------------------------------------------------  OBSTETRICS REPORT                       (Signed Final 08/30/2022 02:44 pm) ---------------------------------------------------------------------- Patient Info  ID #:       [id number]                          D.O.B.:  01/03/1986 (37 yrs)  Name:       Selena Meyer                   Visit Date: 08/30/2022 07:52 am ----------------------------------------------------------------------  Date:  01/18/22                  EDD:   10/25/22  Best:          Selena Meyer 5d     Det. By:  LMP  (01/18/22)          EDD:   10/25/22 ---------------------------------------------------------------------- Anatomy  Cranium:               Appears normal         Diaphragm:              Appears normal  Ventricles:            Bilat.ventriculo,      Stomach:                Appears normal, left                         Rt=16 Lt=13                                                                        sided  Choroid Plexus:        Dangling Choroid       Kidneys:                Appear normal  Thoracic:              Appears normal         Bladder:                Appears normal ---------------------------------------------------------------------- Cervix Uterus Adnexa  Cervix  Not visualized (advanced GA >24wks)  Uterus  No abnormality visualized.  Right Ovary  Not visualized.  Left Ovary  Not visualized.  Cul De Sac  No free fluid seen.  Adnexa  No abnormality visualized ---------------------------------------------------------------------- Comments  This patient was sent to the MAU due to a nonreactive NST  noted in the office.  Her pregnancy has been complicated by  chronic kidney disease and gestational diabetes treated with  insulin.  A biophysical profile performed today was 8/8.  The AFI was 13.9 cm (within normal limits).  Bilateral ventriculomegaly continues to be noted in the fetal  brain.  The patient has a fetal MRI scheduled at [facility] on  September 05, 2022. ----------------------------------------------------------------------                   Selena Rings, MD Electronically Signed Final Report   09/05/2022 04:23 pm ----------------------------------------------------------------------   [place] MFM OB FOLLOW UP  Result Date: 08/30/2022 ----------------------------------------------------------------------  OBSTETRICS REPORT                       (Signed Final 08/30/2022 02:44 pm) ---------------------------------------------------------------------- Patient Info  ID #:       [id number]                          D.O.B.:  01/03/1986 (37 yrs)  Name:       Selena Meyer                   Visit Date: 08/30/2022 07:52 am ----------------------------------------------------------------------  fetal brain ----------------------------------------------------------------------                  Braxton Feathers, DO Electronically Signed Final Report   08/30/2022 02:44 pm ----------------------------------------------------------------------   [place] MFM FETAL BPP WO NON STRESS  Result Date: 08/30/2022 ----------------------------------------------------------------------  OBSTETRICS REPORT                       (Signed Final 08/30/2022 02:44 pm) ---------------------------------------------------------------------- Patient Info  ID #:       [id number]                           D.O.B.:  December 06, 1985 (37 yrs)  Name:       Selena Meyer                   Visit Date: 08/30/2022 07:52 am ---------------------------------------------------------------------- Performed By  Attending:        Braxton Feathers DO       Ref. Address:     [address] [place]  Performed By:     Percell Boston          Secondary Phy.:   [facility]                    RDMS                                                             Care  Referred By:      [facility]       [facility] ---------------------------------------------------------------------- Orders  #  Description                           Code        Ordered By  1  [place] MFM OB FOLLOW UP                   [phone]    Milas Hock  2  [place] MFM FETAL BPP WO NON               29518.84    PAULA DUNCAN     STRESS ----------------------------------------------------------------------  #  Order #                     Accession #                Episode #  1  [id number]  fetal brain ----------------------------------------------------------------------                  Braxton Feathers, DO Electronically Signed Final Report   08/30/2022 02:44 pm ----------------------------------------------------------------------   [place] MFM FETAL BPP WO NON STRESS  Result Date: 08/30/2022 ----------------------------------------------------------------------  OBSTETRICS REPORT                       (Signed Final 08/30/2022 02:44 pm) ---------------------------------------------------------------------- Patient Info  ID #:       [id number]                           D.O.B.:  December 06, 1985 (37 yrs)  Name:       Selena Meyer                   Visit Date: 08/30/2022 07:52 am ---------------------------------------------------------------------- Performed By  Attending:        Braxton Feathers DO       Ref. Address:     [address] [place]  Performed By:     Percell Boston          Secondary Phy.:   [facility]                    RDMS                                                             Care  Referred By:      [facility]       [facility] ---------------------------------------------------------------------- Orders  #  Description                           Code        Ordered By  1  [place] MFM OB FOLLOW UP                   [phone]    Milas Hock  2  [place] MFM FETAL BPP WO NON               29518.84    PAULA DUNCAN     STRESS ----------------------------------------------------------------------  #  Order #                     Accession #                Episode #  1  [id number]  D.O.B.:  [birth year] (37 yrs)  Name:       Selena Meyer                   Visit Date: 09/13/2022 03:12 pm ---------------------------------------------------------------------- Performed By  Attending:        Noralee Space MD        Ref. Address:     [address] [place]  Performed By:     Alain Marion     Secondary Phy.:   [facility] MAU/Triage                    RDMS  Referred By:      Everardo All       [facility] ---------------------------------------------------------------------- Orders  #  Description                           Code        Ordered By  1  [place] MFM FETAL BPP WO NON               76819.01    Mariel Aloe     STRESS ----------------------------------------------------------------------  #  Order #                     Accession #                Episode #  1  [id number]                   [id number]                 [id number] ---------------------------------------------------------------------- Indications  Pre-eclampsia                                  O14.90  Fetal ventriculomegaly

## 2022-09-28 ENCOUNTER — Other Ambulatory Visit: Payer: Self-pay

## 2022-09-28 ENCOUNTER — Encounter (HOSPITAL_COMMUNITY): Payer: Self-pay | Admitting: *Deleted

## 2022-09-28 ENCOUNTER — Telehealth (HOSPITAL_COMMUNITY): Payer: Self-pay | Admitting: *Deleted

## 2022-09-28 ENCOUNTER — Inpatient Hospital Stay (HOSPITAL_COMMUNITY)
Admission: AD | Admit: 2022-09-28 | Discharge: 2022-10-02 | DRG: 806 | Disposition: A | Discharge status: Home / Self Care | Payer: BC Managed Care – PPO | Attending: Obstetrics & Gynecology | Admitting: Obstetrics & Gynecology

## 2022-09-28 ENCOUNTER — Ambulatory Visit: Payer: BC Managed Care – PPO

## 2022-09-28 ENCOUNTER — Encounter (HOSPITAL_COMMUNITY): Payer: Self-pay | Admitting: Obstetrics and Gynecology

## 2022-09-28 ENCOUNTER — Ambulatory Visit (INDEPENDENT_AMBULATORY_CARE_PROVIDER_SITE_OTHER): Payer: BC Managed Care – PPO | Admitting: *Deleted

## 2022-09-28 ENCOUNTER — Other Ambulatory Visit (HOSPITAL_COMMUNITY): Payer: Self-pay

## 2022-09-28 ENCOUNTER — Ambulatory Visit (INDEPENDENT_AMBULATORY_CARE_PROVIDER_SITE_OTHER): Payer: BC Managed Care – PPO | Admitting: Obstetrics and Gynecology

## 2022-09-28 VITALS — BP 158/95 | HR 80 | Wt 196.0 lb

## 2022-09-28 DIAGNOSIS — O26893 Other specified pregnancy related conditions, third trimester: Secondary | ICD-10-CM | POA: Diagnosis present

## 2022-09-28 DIAGNOSIS — O10919 Unspecified pre-existing hypertension complicating pregnancy, unspecified trimester: Secondary | ICD-10-CM

## 2022-09-28 DIAGNOSIS — Z3A36 36 weeks gestation of pregnancy: Secondary | ICD-10-CM

## 2022-09-28 DIAGNOSIS — N182 Chronic kidney disease, stage 2 (mild): Secondary | ICD-10-CM | POA: Diagnosis present

## 2022-09-28 DIAGNOSIS — N051 Unspecified nephritic syndrome with focal and segmental glomerular lesions: Secondary | ICD-10-CM | POA: Diagnosis present

## 2022-09-28 DIAGNOSIS — Z833 Family history of diabetes mellitus: Secondary | ICD-10-CM | POA: Diagnosis not present

## 2022-09-28 DIAGNOSIS — K920 Hematemesis: Secondary | ICD-10-CM | POA: Diagnosis present

## 2022-09-28 DIAGNOSIS — O10913 Unspecified pre-existing hypertension complicating pregnancy, third trimester: Secondary | ICD-10-CM

## 2022-09-28 DIAGNOSIS — O1092 Unspecified pre-existing hypertension complicating childbirth: Secondary | ICD-10-CM | POA: Diagnosis present

## 2022-09-28 DIAGNOSIS — O0990 Supervision of high risk pregnancy, unspecified, unspecified trimester: Secondary | ICD-10-CM

## 2022-09-28 DIAGNOSIS — O359XX0 Maternal care for (suspected) fetal abnormality and damage, unspecified, not applicable or unspecified: Secondary | ICD-10-CM

## 2022-09-28 DIAGNOSIS — Z794 Long term (current) use of insulin: Secondary | ICD-10-CM

## 2022-09-28 DIAGNOSIS — O10213 Pre-existing hypertensive chronic kidney disease complicating pregnancy, third trimester: Secondary | ICD-10-CM | POA: Diagnosis present

## 2022-09-28 DIAGNOSIS — O9962 Diseases of the digestive system complicating childbirth: Secondary | ICD-10-CM | POA: Diagnosis present

## 2022-09-28 DIAGNOSIS — I129 Hypertensive chronic kidney disease with stage 1 through stage 4 chronic kidney disease, or unspecified chronic kidney disease: Secondary | ICD-10-CM | POA: Diagnosis present

## 2022-09-28 DIAGNOSIS — Z8632 Personal history of gestational diabetes: Secondary | ICD-10-CM | POA: Diagnosis present

## 2022-09-28 DIAGNOSIS — O99214 Obesity complicating childbirth: Secondary | ICD-10-CM | POA: Diagnosis present

## 2022-09-28 DIAGNOSIS — O99613 Diseases of the digestive system complicating pregnancy, third trimester: Secondary | ICD-10-CM | POA: Diagnosis present

## 2022-09-28 DIAGNOSIS — O26839 Pregnancy related renal disease, unspecified trimester: Secondary | ICD-10-CM

## 2022-09-28 DIAGNOSIS — O24424 Gestational diabetes mellitus in childbirth, insulin controlled: Secondary | ICD-10-CM | POA: Diagnosis present

## 2022-09-28 DIAGNOSIS — O1414 Severe pre-eclampsia complicating childbirth: Principal | ICD-10-CM | POA: Diagnosis present

## 2022-09-28 DIAGNOSIS — Z3490 Encounter for supervision of normal pregnancy, unspecified, unspecified trimester: Secondary | ICD-10-CM | POA: Diagnosis present

## 2022-09-28 DIAGNOSIS — O114 Pre-existing hypertension with pre-eclampsia, complicating childbirth: Secondary | ICD-10-CM | POA: Diagnosis present

## 2022-09-28 DIAGNOSIS — O09512 Supervision of elderly primigravida, second trimester: Secondary | ICD-10-CM

## 2022-09-28 DIAGNOSIS — K219 Gastro-esophageal reflux disease without esophagitis: Secondary | ICD-10-CM | POA: Diagnosis present

## 2022-09-28 DIAGNOSIS — O26833 Pregnancy related renal disease, third trimester: Secondary | ICD-10-CM | POA: Diagnosis present

## 2022-09-28 DIAGNOSIS — I517 Cardiomegaly: Secondary | ICD-10-CM

## 2022-09-28 DIAGNOSIS — I10 Essential (primary) hypertension: Secondary | ICD-10-CM | POA: Diagnosis present

## 2022-09-28 DIAGNOSIS — O24419 Gestational diabetes mellitus in pregnancy, unspecified control: Secondary | ICD-10-CM | POA: Diagnosis present

## 2022-09-28 DIAGNOSIS — O163 Unspecified maternal hypertension, third trimester: Secondary | ICD-10-CM

## 2022-09-28 DIAGNOSIS — D509 Iron deficiency anemia, unspecified: Secondary | ICD-10-CM

## 2022-09-28 DIAGNOSIS — Z87891 Personal history of nicotine dependence: Secondary | ICD-10-CM

## 2022-09-28 DIAGNOSIS — O24414 Gestational diabetes mellitus in pregnancy, insulin controlled: Secondary | ICD-10-CM

## 2022-09-28 DIAGNOSIS — O3509X0 Maternal care for (suspected) other central nervous system malformation or damage in fetus, not applicable or unspecified: Secondary | ICD-10-CM | POA: Diagnosis present

## 2022-09-28 LAB — HIV ANTIBODY (ROUTINE TESTING W REFLEX): HIV Screen 4th Generation wRfx: NONREACTIVE

## 2022-09-28 LAB — COMPREHENSIVE METABOLIC PANEL
ALT: 13 U/L (ref 0–44)
AST: 18 U/L (ref 15–41)
Albumin: 2.5 g/dL — ABNORMAL LOW (ref 3.5–5.0)
Alkaline Phosphatase: 60 U/L (ref 38–126)
Anion gap: 7 (ref 5–15)
BUN: 18 mg/dL (ref 6–20)
CO2: 21 mmol/L — ABNORMAL LOW (ref 22–32)
Calcium: 8.9 mg/dL (ref 8.9–10.3)
Chloride: 105 mmol/L (ref 98–111)
Creatinine, Ser: 1.27 mg/dL — ABNORMAL HIGH (ref 0.44–1.00)
GFR, Estimated: 55 mL/min — ABNORMAL LOW (ref >60)
Glucose, Bld: 89 mg/dL (ref 70–99)
Potassium: 4.2 mmol/L (ref 3.5–5.1)
Sodium: 133 mmol/L — ABNORMAL LOW (ref 135–145)
Total Bilirubin: 0.2 mg/dL — ABNORMAL LOW (ref 0.3–1.2)
Total Protein: 6.2 g/dL — ABNORMAL LOW (ref 6.5–8.1)

## 2022-09-28 LAB — CBC
HCT: 30.1 % — ABNORMAL LOW (ref 36.0–46.0)
Hemoglobin: 10 g/dL — ABNORMAL LOW (ref 12.0–15.0)
MCH: 29.6 pg (ref 26.0–34.0)
MCHC: 33.2 g/dL (ref 30.0–36.0)
MCV: 89.1 fL (ref 80.0–100.0)
Platelets: 365 10*3/uL (ref 150–400)
RBC: 3.38 MIL/uL — ABNORMAL LOW (ref 3.87–5.11)
RDW: 14.2 % (ref 11.5–15.5)
WBC: 11.2 10*3/uL — ABNORMAL HIGH (ref 4.0–10.5)
nRBC: 0 % (ref 0.0–0.2)

## 2022-09-28 LAB — PROTEIN / CREATININE RATIO, URINE
Creatinine, Urine: 141 mg/dL
Protein Creatinine Ratio: 0.67 mg/mg{Cre} — ABNORMAL HIGH (ref 0.00–0.15)
Total Protein, Urine: 95 mg/dL

## 2022-09-28 LAB — TYPE AND SCREEN
ABO/RH(D): A POS
Antibody Screen: NEGATIVE

## 2022-09-28 LAB — GLUCOSE, CAPILLARY: Glucose-Capillary: 108 mg/dL — ABNORMAL HIGH (ref 70–99)

## 2022-09-28 MED ORDER — OXYCODONE-ACETAMINOPHEN 5-325 MG PO TABS: 2.0000 | ORAL_TABLET | ORAL | Status: DC | PRN

## 2022-09-28 MED ORDER — ACETAMINOPHEN 325 MG PO TABS: 650.0000 mg | ORAL_TABLET | ORAL | Status: DC | PRN

## 2022-09-28 MED ORDER — OXYTOCIN BOLUS FROM INFUSION
333.0000 mL | Freq: Once | INTRAVENOUS | Status: AC
  Administered 2022-09-29: 333 mL via INTRAVENOUS

## 2022-09-28 MED ORDER — HYDRALAZINE HCL 25 MG PO TABS
25.0000 mg | ORAL_TABLET | Freq: Four times a day (QID) | ORAL | Status: DC
  Administered 2022-09-28 – 2022-09-30 (×5): 25 mg via ORAL
  Filled 2022-09-28 (×8): qty 1

## 2022-09-28 MED ORDER — HYDRALAZINE HCL 20 MG/ML IJ SOLN: 10.0000 mg | INTRAMUSCULAR | Status: DC | PRN

## 2022-09-28 MED ORDER — LABETALOL HCL 5 MG/ML IV SOLN
80.0000 mg | INTRAVENOUS | Status: DC | PRN
  Administered 2022-09-28: 80 mg via INTRAVENOUS
  Filled 2022-09-28: qty 16

## 2022-09-28 MED ORDER — TERBUTALINE SULFATE 1 MG/ML IJ SOLN: 0.2500 mg | Freq: Once | INTRAMUSCULAR | Status: DC | PRN

## 2022-09-28 MED ORDER — HYDRALAZINE HCL 20 MG/ML IJ SOLN
10.0000 mg | INTRAMUSCULAR | Status: DC | PRN
  Administered 2022-09-28: 10 mg via INTRAVENOUS
  Filled 2022-09-28: qty 1

## 2022-09-28 MED ORDER — NIFEDIPINE ER OSMOTIC RELEASE 60 MG PO TB24
60.0000 mg | ORAL_TABLET | Freq: Two times a day (BID) | ORAL | Status: DC
  Administered 2022-09-28 – 2022-10-02 (×8): 60 mg via ORAL
  Filled 2022-09-28 (×2): qty 1
  Filled 2022-09-28: qty 2
  Filled 2022-09-28: qty 1
  Filled 2022-09-28: qty 2
  Filled 2022-09-28 (×3): qty 1

## 2022-09-28 MED ORDER — LACTATED RINGERS IV SOLN: INTRAVENOUS | Status: DC

## 2022-09-28 MED ORDER — LABETALOL HCL 5 MG/ML IV SOLN
20.0000 mg | INTRAVENOUS | Status: DC | PRN
  Administered 2022-09-28: 20 mg via INTRAVENOUS
  Filled 2022-09-28: qty 4

## 2022-09-28 MED ORDER — MAGNESIUM SULFATE 40 GM/1000ML IV SOLN
1.0000 g/h | INTRAVENOUS | Status: DC
  Filled 2022-09-28: qty 1000

## 2022-09-28 MED ORDER — SODIUM CHLORIDE 0.9 % IV SOLN
5.0000 10*6.[IU] | Freq: Once | INTRAVENOUS | Status: AC
  Administered 2022-09-28: 5 10*6.[IU] via INTRAVENOUS
  Filled 2022-09-28: qty 5

## 2022-09-28 MED ORDER — MAGNESIUM SULFATE BOLUS VIA INFUSION
4.0000 g | Freq: Once | INTRAVENOUS | Status: AC
  Administered 2022-09-28: 4 g via INTRAVENOUS
  Filled 2022-09-28: qty 1000

## 2022-09-28 MED ORDER — LIDOCAINE HCL (PF) 1 % IJ SOLN: 30.0000 mL | INTRAMUSCULAR | Status: DC | PRN

## 2022-09-28 MED ORDER — LABETALOL HCL 5 MG/ML IV SOLN: 40.0000 mg | INTRAVENOUS | Status: DC | PRN

## 2022-09-28 MED ORDER — OXYCODONE-ACETAMINOPHEN 5-325 MG PO TABS
1.0000 | ORAL_TABLET | ORAL | Status: DC | PRN
  Filled 2022-09-28: qty 1

## 2022-09-28 MED ORDER — INSULIN ASPART 100 UNIT/ML IJ SOLN
0.0000 [IU] | INTRAMUSCULAR | Status: DC
  Administered 2022-09-29 (×2): 1 [IU] via SUBCUTANEOUS

## 2022-09-28 MED ORDER — LACTATED RINGERS IV SOLN: 500.0000 mL | INTRAVENOUS | Status: DC | PRN

## 2022-09-28 MED ORDER — LABETALOL HCL 5 MG/ML IV SOLN: 20.0000 mg | INTRAVENOUS | Status: DC | PRN

## 2022-09-28 MED ORDER — HYDRALAZINE HCL 25 MG PO TABS
25.0000 mg | ORAL_TABLET | Freq: Four times a day (QID) | ORAL | 0 refills | Status: DC
  Filled 2022-09-28: qty 120, 30d supply, fill #0

## 2022-09-28 MED ORDER — ALBUTEROL SULFATE (2.5 MG/3ML) 0.083% IN NEBU: 3.0000 mL | INHALATION_SOLUTION | Freq: Four times a day (QID) | RESPIRATORY_TRACT | Status: DC | PRN

## 2022-09-28 MED ORDER — FENTANYL CITRATE (PF) 100 MCG/2ML IJ SOLN
50.0000 ug | INTRAMUSCULAR | Status: DC | PRN
  Administered 2022-09-28: 100 ug via INTRAVENOUS
  Filled 2022-09-28 (×2): qty 2

## 2022-09-28 MED ORDER — LABETALOL HCL 200 MG PO TABS
800.0000 mg | ORAL_TABLET | Freq: Three times a day (TID) | ORAL | Status: DC
  Administered 2022-09-28 – 2022-09-30 (×5): 800 mg via ORAL
  Filled 2022-09-28 (×6): qty 4

## 2022-09-28 MED ORDER — PANTOPRAZOLE SODIUM 40 MG PO TBEC
40.0000 mg | DELAYED_RELEASE_TABLET | Freq: Every day | ORAL | Status: DC
  Administered 2022-09-29: 40 mg via ORAL
  Filled 2022-09-28: qty 1

## 2022-09-28 MED ORDER — MISOPROSTOL 25 MCG QUARTER TABLET
25.0000 ug | ORAL_TABLET | ORAL | Status: DC | PRN
  Administered 2022-09-28: 25 ug via VAGINAL
  Filled 2022-09-28: qty 1

## 2022-09-28 MED ORDER — SOD CITRATE-CITRIC ACID 500-334 MG/5ML PO SOLN: 30.0000 mL | ORAL | Status: DC | PRN

## 2022-09-28 MED ORDER — ONDANSETRON HCL 4 MG/2ML IJ SOLN
4.0000 mg | Freq: Four times a day (QID) | INTRAMUSCULAR | Status: DC | PRN
  Administered 2022-09-28: 4 mg via INTRAVENOUS
  Filled 2022-09-28: qty 2

## 2022-09-28 MED ORDER — LABETALOL HCL 5 MG/ML IV SOLN
40.0000 mg | INTRAVENOUS | Status: DC | PRN
  Administered 2022-09-28: 40 mg via INTRAVENOUS
  Filled 2022-09-28: qty 8

## 2022-09-28 MED ORDER — PENICILLIN G POT IN DEXTROSE 60000 UNIT/ML IV SOLN
3.0000 10*6.[IU] | INTRAVENOUS | Status: DC
  Administered 2022-09-29 (×2): 3 10*6.[IU] via INTRAVENOUS
  Filled 2022-09-28 (×3): qty 50

## 2022-09-28 MED ORDER — OXYTOCIN-SODIUM CHLORIDE 30-0.9 UT/500ML-% IV SOLN: 2.5000 [IU]/h | INTRAVENOUS | Status: DC

## 2022-09-28 MED ORDER — LABETALOL HCL 5 MG/ML IV SOLN: 80.0000 mg | INTRAVENOUS | Status: DC | PRN

## 2022-09-28 NOTE — H&P (Addendum)
Selena Meyer is a G1P0 at [redacted]w[redacted]d 37 y.o. female presenting for IOL for SIPE with SF (BP). Blood pressure at her routine prenatal visit was in the 160s/90s, she was referred for direct admit for IOL. 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 Fetal imaging was positive for ventriculometry, hypoplasia of the cerebellar vermis. She is s/p Mag and BMZ on 8/8-9 Pt denies headache, vision change, CP, SOB, RUQ pain.   Prenatal care has occurred at [facility]. Pt plans for breastfeeding, bottle supplementing as needed. She declines contraception. IS still researching pediatricians for her infant.   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    Gestational diabetes    Hypertension    Hypokalemia 11/02/2020   Irregular menstrual cycle    Irregular periods/menstrual cycles 05/03/2017   Will hold off on Provera or other fertility stuff for now, pt advised pregnancy is not recommended until BP under better control at least, plus other workup pen   Polycystic ovaries 11/09/2015   Proteinuria 02/28/2013   Stage 2 chronic kidney disease 04/27/2020   SVT (supraventricular tachycardia) 11/01/2020   Systolic murmur 07/16/2017   Tobacco abuse 02/28/2013   Uncontrolled hypertension 05/10/2017   Past Surgical History:  Procedure Laterality Date   NO PAST SURGERIES     Family History: family history includes Cancer in her maternal aunt and maternal  grandmother; Diabetes in her maternal grandmother, mother, and sister; High blood pressure in her maternal grandmother and mother; Other in her father; Renal Disease in her mother. Social History:  reports that she quit smoking about 16 months ago. Her smoking use included cigarettes. She started smoking about 6 years ago. She has a 1.3 pack-year smoking history. She has never used smokeless tobacco. She reports that she does not currently use alcohol. She reports that she does not use drugs.     Maternal Diabetes: Yes:  Diabetes Type:  Insulin/Medication controlled Genetic Screening: Normal Maternal Ultrasounds/Referrals: Other: fetal ventriculometry  Fetal Ultrasounds or other Referrals:  Referred to Materal Fetal Medicine  Maternal Substance Abuse:  No Significant Maternal Medications:  None Significant Maternal Lab Results:  Other:  GBS status unknown.  Number of Prenatal Visits:greater than 3 verified prenatal visits Maternal Vaccinations:TDap Other Comments:  None  Review of Systems  Constitutional:  Negative for chills, fatigue and fever.  Eyes:  Negative for visual disturbance.  Respiratory:  Negative for shortness of breath.   Cardiovascular:  Negative for chest pain.  Gastrointestinal:  Negative for abdominal pain, nausea and vomiting.  Genitourinary:  Negative for vaginal bleeding and vaginal discharge.  Skin: Negative.   Neurological:  Negative for headaches.  Hematological: Negative.  Psychiatric/Behavioral: Negative.     Maternal Medical History:  Reason for admission: Nausea. IOL 2/2 severe-range BP  Contractions: None.  Fetal activity: Perceived fetal activity is normal.   Prenatal complications: PIH.   No substance abuse.   Prenatal Complications - Diabetes: gestational. Diabetes is managed by insulin injections.     Dilation: 1 Effacement (%): 50 Station: -3 Exam by:: Rukaya Kleinschmidt MD Blood pressure (!) 150/83, pulse 78, temperature 97.8 F (36.6 C),  temperature source Axillary, resp. rate 12, height 5\' 5"  (1.651 m), weight 88.9 kg, last menstrual period 01/18/2022, SpO2 98%. Maternal Exam:  Uterine Assessment: Contraction strength is mild.  Abdomen: Patient reports no abdominal tenderness. Introitus: not evaluated.   Cervix: not evaluated.   Physical Exam Constitutional:      Appearance: Normal appearance.  HENT:     Head: Normocephalic and atraumatic.     Right Ear: External ear normal.     Left Ear: External ear normal.     Nose: Nose normal.     Mouth/Throat:     Mouth: Mucous membranes are moist.     Pharynx: Oropharynx is clear.  Eyes:     Conjunctiva/sclera: Conjunctivae normal.  Cardiovascular:     Rate and Rhythm: Normal rate and regular rhythm.  Pulmonary:     Effort: Pulmonary effort is normal.     Breath sounds: Normal breath sounds.  Abdominal:     General: Abdomen is flat.     Palpations: Abdomen is soft.  Musculoskeletal:        General: Normal range of motion.  Neurological:     General: No focal deficit present.     Mental Status: She is alert and oriented to person, place, and time.  Psychiatric:        Behavior: Behavior normal.     Prenatal labs: ABO, Rh: --/--/A POS (09/19 1953) Antibody: NEG (09/19 1953) Rubella: 2.03 (03/25 1147) RPR: Non Reactive (07/25 1514)  HBsAg: Negative (03/25 1147)  HIV: Non Reactive (09/19 1953)  GBS: NEGATIVE/-- (09/19 2307) pending  Assessment/Plan:  #Supervision of high risk pregnancy #Induction of labor 2/2 SIPE w/ SF(BP) -PCN for GBS unknown and preterm gestation -FB w/ 50cc placed without complication; cytotec also placed vaginally -Epidural for pain control at patient request -Plan for NSVD -Plan for breastfeeds -Declines contraception  #SIPE w/ SF (BP), CKD/FSGS  -asymptomatic at this time  -Labetalol protocol & home meds ordered  -Labs c/w baseline, plan to trend q6h with mag level given Cr 1.2  -Mag 4/1 for seizure ppx  -avoid NSAIDs  postpartum  #GDMA2  - accuchecks q4h with SSI prn  #Fetal ventriculomegaly  - pediatricians aware  Lennart Pall 09/29/2022, 2:39 AM

## 2022-09-28 NOTE — Telephone Encounter (Signed)
Preadmission screen  

## 2022-09-29 ENCOUNTER — Ambulatory Visit: Payer: BC Managed Care – PPO

## 2022-09-29 ENCOUNTER — Inpatient Hospital Stay (HOSPITAL_COMMUNITY): Payer: BC Managed Care – PPO | Admitting: Anesthesiology

## 2022-09-29 ENCOUNTER — Encounter (HOSPITAL_COMMUNITY): Payer: Self-pay | Admitting: Obstetrics and Gynecology

## 2022-09-29 ENCOUNTER — Encounter (HOSPITAL_COMMUNITY)
Admission: AD | Disposition: A | Discharge status: Home / Self Care | Payer: Self-pay | Source: Home / Self Care | Attending: Obstetrics & Gynecology

## 2022-09-29 DIAGNOSIS — O24424 Gestational diabetes mellitus in childbirth, insulin controlled: Secondary | ICD-10-CM | POA: Diagnosis not present

## 2022-09-29 DIAGNOSIS — O1414 Severe pre-eclampsia complicating childbirth: Principal | ICD-10-CM | POA: Diagnosis present

## 2022-09-29 DIAGNOSIS — Z3A36 36 weeks gestation of pregnancy: Secondary | ICD-10-CM | POA: Diagnosis not present

## 2022-09-29 LAB — COMPREHENSIVE METABOLIC PANEL
ALT: 15 U/L (ref 0–44)
ALT: 16 U/L (ref 0–44)
ALT: 14 U/L (ref 0–44)
AST: 21 U/L (ref 15–41)
AST: 18 U/L (ref 15–41)
AST: 22 U/L (ref 15–41)
Albumin: 2.5 g/dL — ABNORMAL LOW (ref 3.5–5.0)
Albumin: 2.5 g/dL — ABNORMAL LOW (ref 3.5–5.0)
Albumin: 2.4 g/dL — ABNORMAL LOW (ref 3.5–5.0)
Alkaline Phosphatase: 65 U/L (ref 38–126)
Alkaline Phosphatase: 61 U/L (ref 38–126)
Alkaline Phosphatase: 69 U/L (ref 38–126)
Anion gap: 11 (ref 5–15)
Anion gap: 6 (ref 5–15)
Anion gap: 9 (ref 5–15)
BUN: 15 mg/dL (ref 6–20)
BUN: 14 mg/dL (ref 6–20)
BUN: 14 mg/dL (ref 6–20)
CO2: 18 mmol/L — ABNORMAL LOW (ref 22–32)
CO2: 19 mmol/L — ABNORMAL LOW (ref 22–32)
CO2: 17 mmol/L — ABNORMAL LOW (ref 22–32)
Calcium: 8.8 mg/dL — ABNORMAL LOW (ref 8.9–10.3)
Calcium: 8.6 mg/dL — ABNORMAL LOW (ref 8.9–10.3)
Calcium: 8.2 mg/dL — ABNORMAL LOW (ref 8.9–10.3)
Chloride: 103 mmol/L (ref 98–111)
Chloride: 106 mmol/L (ref 98–111)
Chloride: 106 mmol/L (ref 98–111)
Creatinine, Ser: 1.15 mg/dL — ABNORMAL HIGH (ref 0.44–1.00)
Creatinine, Ser: 1.2 mg/dL — ABNORMAL HIGH (ref 0.44–1.00)
Creatinine, Ser: 1.31 mg/dL — ABNORMAL HIGH (ref 0.44–1.00)
GFR, Estimated: >60 mL/min (ref >60)
GFR, Estimated: 59 mL/min — ABNORMAL LOW (ref >60)
GFR, Estimated: 53 mL/min — ABNORMAL LOW (ref >60)
Glucose, Bld: 95 mg/dL (ref 70–99)
Glucose, Bld: 99 mg/dL (ref 70–99)
Glucose, Bld: 120 mg/dL — ABNORMAL HIGH (ref 70–99)
Potassium: 4.2 mmol/L (ref 3.5–5.1)
Potassium: 4.3 mmol/L (ref 3.5–5.1)
Potassium: 4.3 mmol/L (ref 3.5–5.1)
Sodium: 132 mmol/L — ABNORMAL LOW (ref 135–145)
Sodium: 131 mmol/L — ABNORMAL LOW (ref 135–145)
Sodium: 132 mmol/L — ABNORMAL LOW (ref 135–145)
Total Bilirubin: 0.6 mg/dL (ref 0.3–1.2)
Total Bilirubin: 0.2 mg/dL — ABNORMAL LOW (ref 0.3–1.2)
Total Bilirubin: 0.7 mg/dL (ref 0.3–1.2)
Total Protein: 6.4 g/dL — ABNORMAL LOW (ref 6.5–8.1)
Total Protein: 6.5 g/dL (ref 6.5–8.1)
Total Protein: 6.1 g/dL — ABNORMAL LOW (ref 6.5–8.1)

## 2022-09-29 LAB — GROUP B STREP BY PCR: Group B strep by PCR: NEGATIVE

## 2022-09-29 LAB — CBC
HCT: 31.2 % — ABNORMAL LOW (ref 36.0–46.0)
HCT: 31.9 % — ABNORMAL LOW (ref 36.0–46.0)
HCT: 32.6 % — ABNORMAL LOW (ref 36.0–46.0)
Hemoglobin: 10.3 g/dL — ABNORMAL LOW (ref 12.0–15.0)
Hemoglobin: 10.8 g/dL — ABNORMAL LOW (ref 12.0–15.0)
Hemoglobin: 11 g/dL — ABNORMAL LOW (ref 12.0–15.0)
MCH: 29.1 pg (ref 26.0–34.0)
MCH: 29.9 pg (ref 26.0–34.0)
MCH: 29.9 pg (ref 26.0–34.0)
MCHC: 33 g/dL (ref 30.0–36.0)
MCHC: 33.9 g/dL (ref 30.0–36.0)
MCHC: 33.7 g/dL (ref 30.0–36.0)
MCV: 88.1 fL (ref 80.0–100.0)
MCV: 88.4 fL (ref 80.0–100.0)
MCV: 88.6 fL (ref 80.0–100.0)
Platelets: 369 10*3/uL (ref 150–400)
Platelets: 350 10*3/uL (ref 150–400)
Platelets: 368 10*3/uL (ref 150–400)
RBC: 3.54 MIL/uL — ABNORMAL LOW (ref 3.87–5.11)
RBC: 3.61 MIL/uL — ABNORMAL LOW (ref 3.87–5.11)
RBC: 3.68 MIL/uL — ABNORMAL LOW (ref 3.87–5.11)
RDW: 14 % (ref 11.5–15.5)
RDW: 14.2 % (ref 11.5–15.5)
RDW: 14.1 % (ref 11.5–15.5)
WBC: 14 10*3/uL — ABNORMAL HIGH (ref 4.0–10.5)
WBC: 13.9 10*3/uL — ABNORMAL HIGH (ref 4.0–10.5)
WBC: 18.2 10*3/uL — ABNORMAL HIGH (ref 4.0–10.5)
nRBC: 0 % (ref 0.0–0.2)
nRBC: 0 % (ref 0.0–0.2)
nRBC: 0 % (ref 0.0–0.2)

## 2022-09-29 LAB — MAGNESIUM
Magnesium: 3.7 mg/dL — ABNORMAL HIGH (ref 1.7–2.4)
Magnesium: 4.2 mg/dL — ABNORMAL HIGH (ref 1.7–2.4)
Magnesium: 4.5 mg/dL — ABNORMAL HIGH (ref 1.7–2.4)

## 2022-09-29 LAB — GLUCOSE, CAPILLARY
Glucose-Capillary: 108 mg/dL — ABNORMAL HIGH (ref 70–99)
Glucose-Capillary: 110 mg/dL — ABNORMAL HIGH (ref 70–99)
Glucose-Capillary: 86 mg/dL (ref 70–99)

## 2022-09-29 LAB — RPR: RPR Ser Ql: NONREACTIVE

## 2022-09-29 SURGERY — Surgical Case
Anesthesia: Epidural

## 2022-09-29 MED ORDER — DIPHENOXYLATE-ATROPINE 2.5-0.025 MG PO TABS
2.0000 | ORAL_TABLET | ORAL | Status: AC
  Administered 2022-09-29: 2 via ORAL
  Filled 2022-09-29: qty 2

## 2022-09-29 MED ORDER — LACTATED RINGERS IV SOLN: INTRAVENOUS | Status: DC

## 2022-09-29 MED ORDER — TRANEXAMIC ACID-NACL 1000-0.7 MG/100ML-% IV SOLN
INTRAVENOUS | Status: AC
  Filled 2022-09-29: qty 100

## 2022-09-29 MED ORDER — TETANUS-DIPHTH-ACELL PERTUSSIS 5-2.5-18.5 LF-MCG/0.5 IM SUSY: 0.5000 mL | PREFILLED_SYRINGE | Freq: Once | INTRAMUSCULAR | Status: DC

## 2022-09-29 MED ORDER — BENZOCAINE-MENTHOL 20-0.5 % EX AERO: 1.0000 | INHALATION_SPRAY | CUTANEOUS | Status: DC | PRN

## 2022-09-29 MED ORDER — LIDOCAINE HCL (PF) 1 % IJ SOLN
INTRAMUSCULAR | Status: DC | PRN
  Administered 2022-09-29 (×2): 5 mL via EPIDURAL

## 2022-09-29 MED ORDER — LIDOCAINE-EPINEPHRINE (PF) 2 %-1:200000 IJ SOLN
INTRAMUSCULAR | Status: DC | PRN
  Administered 2022-09-29 (×2): 5 mL via EPIDURAL

## 2022-09-29 MED ORDER — DIPHENHYDRAMINE HCL 25 MG PO CAPS: 25.0000 mg | ORAL_CAPSULE | Freq: Four times a day (QID) | ORAL | Status: DC | PRN

## 2022-09-29 MED ORDER — SIMETHICONE 80 MG PO CHEW: 80.0000 mg | CHEWABLE_TABLET | ORAL | Status: DC | PRN

## 2022-09-29 MED ORDER — MAGNESIUM SULFATE 40 GM/1000ML IV SOLN
1.0000 g/h | INTRAVENOUS | Status: AC
  Filled 2022-09-29: qty 1000

## 2022-09-29 MED ORDER — FENTANYL-BUPIVACAINE-NACL 0.5-0.125-0.9 MG/250ML-% EP SOLN
12.0000 mL/h | EPIDURAL | Status: DC | PRN
  Administered 2022-09-29: 12 mL/h via EPIDURAL
  Filled 2022-09-29: qty 250

## 2022-09-29 MED ORDER — COCONUT OIL OIL: 1.0000 | TOPICAL_OIL | Status: DC | PRN

## 2022-09-29 MED ORDER — ACETAMINOPHEN 500 MG PO TABS
1000.0000 mg | ORAL_TABLET | Freq: Four times a day (QID) | ORAL | Status: DC
  Administered 2022-09-29 – 2022-10-02 (×10): 1000 mg via ORAL
  Filled 2022-09-29 (×11): qty 2

## 2022-09-29 MED ORDER — FENTANYL CITRATE (PF) 100 MCG/2ML IJ SOLN
INTRAMUSCULAR | Status: DC | PRN
  Administered 2022-09-29: 100 ug via EPIDURAL

## 2022-09-29 MED ORDER — ONDANSETRON HCL 4 MG PO TABS: 4.0000 mg | ORAL_TABLET | ORAL | Status: DC | PRN

## 2022-09-29 MED ORDER — MORPHINE SULFATE (PF) 0.5 MG/ML IJ SOLN
INTRAMUSCULAR | Status: AC
  Filled 2022-09-29: qty 10

## 2022-09-29 MED ORDER — CARBOPROST TROMETHAMINE 250 MCG/ML IM SOLN
INTRAMUSCULAR | Status: AC
  Administered 2022-09-29: 250 ug
  Filled 2022-09-29: qty 1

## 2022-09-29 MED ORDER — LACTATED RINGERS IV SOLN: 500.0000 mL | Freq: Once | INTRAVENOUS | Status: DC

## 2022-09-29 MED ORDER — WITCH HAZEL-GLYCERIN EX PADS: 1.0000 | MEDICATED_PAD | CUTANEOUS | Status: DC | PRN

## 2022-09-29 MED ORDER — OXYTOCIN-SODIUM CHLORIDE 30-0.9 UT/500ML-% IV SOLN
1.0000 m[IU]/min | INTRAVENOUS | Status: DC
  Filled 2022-09-29 (×2): qty 500

## 2022-09-29 MED ORDER — EPHEDRINE 5 MG/ML INJ: 10.0000 mg | INTRAVENOUS | Status: DC | PRN

## 2022-09-29 MED ORDER — DIPHENHYDRAMINE HCL 25 MG PO CAPS
25.0000 mg | ORAL_CAPSULE | Freq: Once | ORAL | Status: AC
  Administered 2022-09-29: 25 mg via ORAL
  Filled 2022-09-29: qty 1

## 2022-09-29 MED ORDER — FENTANYL CITRATE (PF) 100 MCG/2ML IJ SOLN
INTRAMUSCULAR | Status: AC
  Filled 2022-09-29: qty 2

## 2022-09-29 MED ORDER — FERROUS SULFATE 325 (65 FE) MG PO TABS
325.0000 mg | ORAL_TABLET | Freq: Two times a day (BID) | ORAL | Status: DC
  Administered 2022-09-29 – 2022-09-30 (×2): 325 mg via ORAL
  Filled 2022-09-29 (×2): qty 1

## 2022-09-29 MED ORDER — TRANEXAMIC ACID-NACL 1000-0.7 MG/100ML-% IV SOLN
1000.0000 mg | INTRAVENOUS | Status: AC
  Administered 2022-09-29: 1000 mg via INTRAVENOUS

## 2022-09-29 MED ORDER — FUROSEMIDE 20 MG PO TABS
20.0000 mg | ORAL_TABLET | Freq: Two times a day (BID) | ORAL | Status: DC
  Administered 2022-09-29 – 2022-10-02 (×6): 20 mg via ORAL
  Filled 2022-09-29 (×6): qty 1

## 2022-09-29 MED ORDER — DIPHENOXYLATE-ATROPINE 2.5-0.025 MG PO TABS
2.0000 | ORAL_TABLET | Freq: Four times a day (QID) | ORAL | Status: DC | PRN
  Filled 2022-09-29: qty 2

## 2022-09-29 MED ORDER — DIPHENHYDRAMINE HCL 50 MG/ML IJ SOLN: 12.5000 mg | INTRAMUSCULAR | Status: DC | PRN

## 2022-09-29 MED ORDER — OXYCODONE HCL 5 MG PO TABS: 5.0000 mg | ORAL_TABLET | ORAL | Status: DC | PRN

## 2022-09-29 MED ORDER — PRENATAL MULTIVITAMIN CH
1.0000 | ORAL_TABLET | Freq: Every day | ORAL | Status: DC
  Administered 2022-09-30 – 2022-10-02 (×3): 1 via ORAL
  Filled 2022-09-29 (×3): qty 1

## 2022-09-29 MED ORDER — PHENYLEPHRINE 80 MCG/ML (10ML) SYRINGE FOR IV PUSH (FOR BLOOD PRESSURE SUPPORT): 80.0000 ug | PREFILLED_SYRINGE | INTRAVENOUS | Status: DC | PRN

## 2022-09-29 MED ORDER — MEASLES, MUMPS & RUBELLA VAC IJ SOLR: 0.5000 mL | Freq: Once | INTRAMUSCULAR | Status: DC

## 2022-09-29 MED ORDER — OXYCODONE HCL 5 MG PO TABS: 10.0000 mg | ORAL_TABLET | ORAL | Status: DC | PRN

## 2022-09-29 MED ORDER — SENNOSIDES-DOCUSATE SODIUM 8.6-50 MG PO TABS
2.0000 | ORAL_TABLET | ORAL | Status: DC
  Administered 2022-09-30 – 2022-10-02 (×3): 2 via ORAL
  Filled 2022-09-29 (×3): qty 2

## 2022-09-29 MED ORDER — ZOLPIDEM TARTRATE 5 MG PO TABS: 5.0000 mg | ORAL_TABLET | Freq: Every evening | ORAL | Status: DC | PRN

## 2022-09-29 MED ORDER — DIBUCAINE (PERIANAL) 1 % EX OINT: 1.0000 | TOPICAL_OINTMENT | CUTANEOUS | Status: DC | PRN

## 2022-09-29 MED ORDER — BUPIVACAINE HCL (PF) 0.25 % IJ SOLN
INTRAMUSCULAR | Status: DC | PRN
  Administered 2022-09-29: 8 mL via EPIDURAL

## 2022-09-29 MED ORDER — CARBOPROST TROMETHAMINE 250 MCG/ML IM SOLN: 250.0000 ug | Freq: Once | INTRAMUSCULAR | Status: AC

## 2022-09-29 MED ORDER — ONDANSETRON HCL 4 MG/2ML IJ SOLN: 4.0000 mg | INTRAMUSCULAR | Status: DC | PRN

## 2022-09-29 MED ORDER — DOCUSATE SODIUM 100 MG PO CAPS
100.0000 mg | ORAL_CAPSULE | Freq: Two times a day (BID) | ORAL | Status: DC
  Administered 2022-09-29 – 2022-10-01 (×5): 100 mg via ORAL
  Filled 2022-09-29 (×6): qty 1

## 2022-09-29 NOTE — Progress Notes (Signed)
Labor Progress Note   Pt comfortable with epidural Cervical exam Fully dilated /+2.  Hand still present at internal os, head felt behind the hand. Unable to reduce hand FHT 130bpm, minimal-moderate, +accels,  variable decels Toco q2-82min  Will proceed with pushing now and monitor fetal hand. Continue magnesium sulfate and antihypertensives for severe preeclampsia. Continue close monitoring.   Jaynie Collins, MD Obstetrician & Gynecologist, [facility] for [employer], [facility]

## 2022-09-29 NOTE — Inpatient Diabetes Management (Signed)
Inpatient Diabetes Program Recommendations  ADA Standards of Care 2023 Diabetes in Pregnancy Target Glucose Ranges:  Fasting: 70 - 95 mg/dL 1 hr postprandial:  409 - 140mg /dL (from first bite of meal) 2 hr postprandial:  100 - 120 mg/dL (from first bit of meal)    Lab Results  Component Value Date   GLUCAP 110 (H) 09/29/2022   HGBA1C 5.5 08/18/2022    Latest Reference Range & Units 09/28/22 21:37 09/29/22 00:54 09/29/22 05:49  Glucose-Capillary 70 - 99 mg/dL 811 (H) 914 (H) 782 (H)  (H): Data is abnormally high  Diabetes history: GD Outpatient Diabetes medications: Semglee 7 units bid, Novolog 5 units tid meal coverage Current orders for Inpatient glycemic control: Novolog 0-14 units q 4 hrs.  Inpatient Diabetes Program Recommendations:   Noted patient is 24 weeks/1day Agree with Pregnant order set with 0-14 units q 4 hrs. Will follow while hospitalized.  Thank you, Billy Fischer. Fayette Gasner, RN, MSN, CDE  Diabetes Coordinator Inpatient Glycemic Control Team Team Pager [phone] (8am-5pm) 09/29/2022 8:36 AM

## 2022-09-29 NOTE — Anesthesia Procedure Notes (Signed)
Epidural Patient location during procedure: OB Start time: 09/29/2022 3:54 AM End time: 09/29/2022 4:06 AM  Staffing Anesthesiologist: Mal Amabile, MD Performed: anesthesiologist   Preanesthetic Checklist Completed: patient identified, IV checked, site marked, risks and benefits discussed, surgical consent, monitors and equipment checked, pre-op evaluation and timeout performed  Epidural Patient position: sitting Prep: DuraPrep and site prepped and draped Patient monitoring: continuous pulse ox and blood pressure Approach: midline Location: L3-L4 Injection technique: LOR air  Needle:  Needle type: Tuohy  Needle gauge: 17 G Needle length: 9 cm and 9 Needle insertion depth: 8 cm Catheter type: closed end flexible Catheter size: 19 Gauge Catheter at skin depth: 13 cm Test dose: negative and Other  Assessment Events: blood not aspirated, no cerebrospinal fluid, injection not painful, no injection resistance, no paresthesia and negative IV test  Additional Notes Patient identified. Risks and benefits discussed including failed block, incomplete  Pain control, post dural puncture headache, nerve damage, paralysis, blood pressure Changes, nausea, vomiting, reactions to medications-both toxic and allergic and post Partum back pain. All questions were answered. Patient expressed understanding and wished to proceed. Sterile technique was used throughout procedure. Epidural site was Dressed with sterile barrier dressing. No paresthesias, signs of intravascular injection Or signs of intrathecal spread were encountered.  Patient was more comfortable after the epidural was dosed. Please see RN's note for documentation of vital signs and FHR which are stable. Reason for block:procedure for pain

## 2022-09-29 NOTE — Lactation Note (Signed)
This note was copied from a baby's chart. Lactation Consultation Note  Patient Name: Selena Meyer [id number] Date: 09/29/2022 Age:39 hours Reason for consult: Primapara;1st time breastfeeding;Late-preterm 34-36.6wks;Infant < 6lbs;Maternal endocrine disorder  P1- Consulting civil engineer Tia requested LC to visit MOB due to infant's low birth weight, gestation of [redacted]w[redacted]d and the need of a glucose test. LC assisted MOB with latching infant to the right breast in the cross cradle hold. Infant latched immediately and had a strong rhythmic suck for 9 min.   LC created a feeding plan with MOB. LC reviewed DBM and formula. MOB declined wanting to use DBM and stated she only wanted formula. LC informed MOB's nurse.  Feeding plan as instructed: -Latch infant to the breast. Infant may nurse for 15 min max if she will nurse at all. -Immediately supplement infant after breastfeeding as directed by crib card. -Feeding will not exceed 30 min total. -Nipple to be used is the Nfant White Slow flow nipple, until SLP or doctor says otherwise. -Once MOB is feeling better, MOB will pump for stimulation after every breastfeeding for 15-20 min.  LC encouraged MOB to follow infant's feeding plan. LC also encouraged MOB to call for a pump to be set up since she did not want one set up yet. LC reviewed LC services handout, feeding infant 8-12x in 24 hrs, not allowing infant to go over 3 hrs without a feeding, and CDC milk storage guidelines.  Maternal Data Has patient been taught Hand Expression?: Yes Does the patient have breastfeeding experience prior to this delivery?: No  Feeding Mother's Current Feeding Choice: Breast Milk and Formula Nipple Type: Nfant Standard Flow (white)  LATCH Score Latch: Grasps breast easily, tongue down, lips flanged, rhythmical sucking.  Audible Swallowing: Spontaneous and intermittent  Type of Nipple: Everted at rest and after stimulation  Comfort (Breast/Nipple): Soft / non-tender  Hold  (Positioning): Full assist, staff holds infant at breast  LATCH Score: 8   Interventions Interventions: Breast feeding basics reviewed;Assisted with latch;Skin to skin;Breast compression;Adjust position;Support pillows;Position options;Education;Pace feeding;Guidelines for Milk Supply and Pumping Schedule Handout;LC Services brochure  Consult Status Consult Status: Follow-up Date: 09/30/22    Dema Severin BS, IBCLC 09/29/2022, 5:45 PM

## 2022-09-29 NOTE — Progress Notes (Signed)
Labor Progress Note  Called to evaluate patient - RN performed SCE and felt hand in front of the head.  Pt comfortable with epidural SCE performed. 4-5cm dilated. Hand present at internal os, head felt behind the hand.  FHT 130bpm, moderate, +accels, rare variable decels Toco q102min  Discussed options for management with the patient. Reviewed that CS can be performed, but that for many women, the baby will move their hand and women will be able to achieve a successful vaginal birth. Reviewed that there are risks including injury to arm/hand depending on positioning or labor dystocia, but that if FHR continues to look appropriate and labor is progressing it would be reasonable to continue to labor. Pt agreeable to continue IOL, but consents signed for CS in case of emergency. The risks of surgery were discussed with the patient including but were not limited to: bleeding which may require transfusion, infection, injury to bowel, bladder, ureters or other surrounding organs; injury to the fetus; formation of adhesions; incisional problems; thromboembolic phenomenon and other postoperative/anesthesia complications.    Will monitor closely and reexamine in 2-4 hours or sooner as needed.   Harvie Bridge, MD Obstetrician & Gynecologist, [facility] for [employer], [facility]

## 2022-09-29 NOTE — Anesthesia Preprocedure Evaluation (Addendum)
Anesthesia Evaluation  Patient identified by MRN, date of birth, ID band Patient awake    Reviewed: Allergy & Precautions, NPO status , Patient's Chart, lab work & pertinent test results, reviewed documented beta blocker date and time   Airway Mallampati: III  TM Distance: >3 FB Neck ROM: Full  Mouth opening: Limited Mouth Opening Comment: Pierced tongue Dental no notable dental hx. (+) Dental Advisory Given, Teeth Intact   Pulmonary former smoker   Pulmonary exam normal breath sounds clear to auscultation       Cardiovascular hypertension, Pt. on medications and Pt. on home beta blockers Normal cardiovascular exam+ Valvular Problems/Murmurs  Rate:Normal  EKG 07/03/22 NSR, LVH  Echo 08/01/2017 Left ventricle: The cavity size was normal. Wall thickness was    increased in a pattern of moderate LVH. Systolic function was    normal. Wall motion was normal; there were no regional wall    motion abnormalities. Doppler parameters are consistent with    abnormal left ventricular relaxation (grade 1 diastolic    dysfunction).      Neuro/Psych  Headaches  negative psych ROS   GI/Hepatic Neg liver ROS,GERD  Medicated,,  Endo/Other  diabetes, Well Controlled, Gestational, Insulin Dependent, Oral Hypoglycemic Agents  Obesity  Renal/GU Renal InsufficiencyRenal disease  negative genitourinary   Musculoskeletal negative musculoskeletal ROS (+)    Abdominal  (+) + obese  Peds  Hematology  (+) Blood dyscrasia, anemia   Anesthesia Other Findings   Reproductive/Obstetrics (+) Pregnancy                             Anesthesia Physical Anesthesia Plan  ASA: 3 and emergent  Anesthesia Plan: Epidural   Post-op Pain Management: Minimal or no pain anticipated and Regional block*   Induction:   PONV Risk Score and Plan: 4 or greater and Treatment may vary due to age or medical condition and Scopolamine  patch - Pre-op  Airway Management Planned: Natural Airway  Additional Equipment: None and Fetal Monitoring  Intra-op Plan:   Post-operative Plan:   Informed Consent: I have reviewed the patients History and Physical, chart, labs and discussed the procedure including the risks, benefits and alternatives for the proposed anesthesia with the patient or authorized representative who has indicated his/her understanding and acceptance.       Plan Discussed with: Anesthesiologist and CRNA  Anesthesia Plan Comments: (Patient for urgent C/Section for hand presentation. Will use epidural for C/Section. M. Malen Gauze, MD)       Anesthesia Quick Evaluation

## 2022-09-29 NOTE — Discharge Summary (Signed)
Postpartum Discharge Summary  Date of Service updated***     Patient Name: Selena Meyer DOB: 08/21/1985 MRN: [id number]  Date of admission: 09/28/2022 Delivery date:09/29/2022 Delivering provider: Jaynie Collins A Date of discharge: 09/29/2022  Admitting diagnosis: Encounter for induction of labor [Z34.90] Intrauterine pregnancy: [redacted]w[redacted]d     Secondary diagnosis:  Principal Problem:   Severe pre-eclampsia, with delivery Active Problems:   FSGS (focal segmental glomerulosclerosis)   Gestational diabetes mellitus (GDM), antepartum   Chronic hypertension during pregnancy, antepartum   Kidney disease in pregnancy, unspecified trimester   Encounter for induction of labor  Additional problems: ***    Discharge diagnosis: Preterm Pregnancy Delivered, Preeclampsia (severe), and GDM A2                                              Post partum procedures:{Postpartum procedures:23558} Augmentation: AROM, Pitocin, Cytotec, and OP Foley Complications: lower segment uterine atony  Hospital course: Induction of Labor With Vaginal Delivery   37 y.o. yo G1P0 at [redacted]w[redacted]d was admitted to the hospital 09/28/2022 for induction of labor.  Indication for induction: Preeclampsia with severe features.  Patient had an labor course complicated by severe range BP, she was  Membrane Rupture Time/Date: 2:14 AM,09/29/2022  Delivery Method:Vaginal, Spontaneous Operative Delivery:N/A Episiotomy: None Lacerations:  Vaginal Details of delivery can be found in separate delivery note.  Patient had a postpartum course complicated by***. Patient is discharged home 09/29/22.  Newborn Data: Birth date:09/29/2022 Birth time:1:15 PM Gender:Female Living status:Living Apgars:6 ,8  Weight:2080 g  Magnesium Sulfate received: Yes: Seizure prophylaxis BMZ received: Yes Rhophylac:N/A MMR:N/A T-DaP:Given prenatally Flu: N/A RSV Vaccine received: No Transfusion:{Transfusion received:30440034}  Immunizations  received: Immunization History  Administered Date(s) Administered   PPD Test 07/16/2017   Tdap 01/10/2015, 08/17/2022    Physical exam  Vitals:   09/29/22 1132 09/29/22 1202 09/29/22 1331 09/29/22 1346  BP: (!) 147/87 (!) 156/94 133/80 (!) 142/78  Pulse: 74 79 98 100  Resp:  16    Temp:  98.8 F (37.1 C)    TempSrc:  Oral    SpO2:      Weight:      Height:       General: {Exam; general:21111117} Lochia: {Desc; appropriate/inappropriate:30686::"appropriate"} Uterine Fundus: {Desc; firm/soft:30687} Incision: {Exam; incision:21111123} DVT Evaluation: {Exam; dvt:2111122} Labs: Lab Results  Component Value Date   WBC 13.9 (H) 09/29/2022   HGB 10.8 (L) 09/29/2022   HCT 31.9 (L) 09/29/2022   MCV 88.4 09/29/2022   PLT 350 09/29/2022      Latest Ref Rng & Units 09/29/2022    7:55 AM  CMP  Glucose 70 - 99 mg/dL 99   BUN 6 - 20 mg/dL 14   Creatinine 4.09 - 1.00 mg/dL 8.11   Sodium 914 - 782 mmol/L 131   Potassium 3.5 - 5.1 mmol/L 4.3   Chloride 98 - 111 mmol/L 106   CO2 22 - 32 mmol/L 19   Calcium 8.9 - 10.3 mg/dL 8.6   Total Protein 6.5 - 8.1 g/dL 6.5   Total Bilirubin 0.3 - 1.2 mg/dL 0.2   Alkaline Phos 38 - 126 U/L 61   AST 15 - 41 U/L 18   ALT 0 - 44 U/L 16    Edinburgh Score:     No data to display         No data  recorded  After visit meds:  Allergies as of 09/29/2022       Reactions   Ibuprofen Hives     Med Rec must be completed prior to using this [facility]***        Discharge home in stable condition Infant Feeding: Bottle and Breast Infant Disposition:{CHL IP OB HOME WITH YQIHKV:42595} Discharge instruction: per After Visit Summary and Postpartum booklet. Activity: Advance as tolerated. Pelvic rest for 6 weeks.  Diet: routine diet Future Appointments: Future Appointments  Date Time Provider Department Center  10/05/2022  9:30 AM Lennart Pall, MD [facility]  10/11/2022 11:30 AM Tobb, Lavona Mound, DO [facility] None   10/16/2022  8:20 AM Tobb, Lavona Mound, DO [facility] None  10/30/2022  4:10 PM Milas Hock, MD [facility]  11/23/2022 11:00 AM [facility] None  11/23/2022 11:15 AM Rushie Chestnut, PA-C [facility] None   Follow up Visit:   Please schedule this patient for a In person postpartum visit in 6 weeks with the following provider: Any provider. Additional Postpartum F/U:2 hour GTT and BP check 1 week  High risk pregnancy complicated by:  preeclamspia with severe features superimposed on cHTN, GDMA2,  FSGS 2/2 CKD, SVT and LVH Delivery mode:  Vaginal, Spontaneous Anticipated Birth Control:   Declines   09/29/2022 Landry Dyke, MD

## 2022-09-29 NOTE — Progress Notes (Signed)
Labor Progress Note  In to see patient at 0215. FB out. She is feeling moderately painful contractions. Denies si/sx preE. BP now mild range. PreE labs & mag level due, will be collected after this exam.  FHT 130bpm, moderate variability, +accels, no decels SCE 4/50/-3 Toco w/ irregular ctx q 2-6 minutes Bedside ultrasound confirms vertex presentation  AROM performed for clear fluid  Will start pitocin. Continue IOL   Harvie Bridge, MD Obstetrician & Gynecologist, [facility] for [facility], [facility]

## 2022-09-29 NOTE — Progress Notes (Signed)
Labor Progress Note  Pt called out feeling more pressure w/ her contractions.  SCE 5/80/-3, hand present in front of head at internal os, but head still applied to cervix FHT 130bpm, moderate variability with periods of minimal variability, no accels, intermittent bowl shaped decels that are difficult to categorize because contractions are not being well traced.  Toco not tracing well, contractions appear to be q2-10 Pit @ 6  Will reposition toco and cut pit back to 58ml/u to see if there is improvement in variability. Can resume titration of pit when we are tracing contractions and are able to characterize possible decels.   Harvie Bridge, MD Obstetrician & Gynecologist, [facility] for [employer], [facility]

## 2022-09-30 LAB — COMPREHENSIVE METABOLIC PANEL
ALT: 16 U/L (ref 0–44)
AST: 25 U/L (ref 15–41)
Albumin: 2.3 g/dL — ABNORMAL LOW (ref 3.5–5.0)
Alkaline Phosphatase: 52 U/L (ref 38–126)
Anion gap: 9 (ref 5–15)
BUN: 16 mg/dL (ref 6–20)
CO2: 21 mmol/L — ABNORMAL LOW (ref 22–32)
Calcium: 8.3 mg/dL — ABNORMAL LOW (ref 8.9–10.3)
Chloride: 103 mmol/L (ref 98–111)
Creatinine, Ser: 1.99 mg/dL — ABNORMAL HIGH (ref 0.44–1.00)
GFR, Estimated: 32 mL/min — ABNORMAL LOW (ref >60)
Glucose, Bld: 100 mg/dL — ABNORMAL HIGH (ref 70–99)
Potassium: 4.3 mmol/L (ref 3.5–5.1)
Sodium: 133 mmol/L — ABNORMAL LOW (ref 135–145)
Total Bilirubin: 0.2 mg/dL — ABNORMAL LOW (ref 0.3–1.2)
Total Protein: 5.8 g/dL — ABNORMAL LOW (ref 6.5–8.1)

## 2022-09-30 LAB — CBC
HCT: 27.5 % — ABNORMAL LOW (ref 36.0–46.0)
Hemoglobin: 9.1 g/dL — ABNORMAL LOW (ref 12.0–15.0)
MCH: 29.6 pg (ref 26.0–34.0)
MCHC: 33.1 g/dL (ref 30.0–36.0)
MCV: 89.6 fL (ref 80.0–100.0)
Platelets: 338 10*3/uL (ref 150–400)
RBC: 3.07 MIL/uL — ABNORMAL LOW (ref 3.87–5.11)
RDW: 14.5 % (ref 11.5–15.5)
WBC: 16.6 10*3/uL — ABNORMAL HIGH (ref 4.0–10.5)
nRBC: 0 % (ref 0.0–0.2)

## 2022-09-30 LAB — MAGNESIUM: Magnesium: 4.9 mg/dL — ABNORMAL HIGH (ref 1.7–2.4)

## 2022-09-30 MED ORDER — FERROUS SULFATE 325 (65 FE) MG PO TABS
325.0000 mg | ORAL_TABLET | ORAL | Status: DC
  Administered 2022-10-02: 325 mg via ORAL
  Filled 2022-09-30: qty 1

## 2022-09-30 MED ORDER — LABETALOL HCL 200 MG PO TABS
800.0000 mg | ORAL_TABLET | Freq: Two times a day (BID) | ORAL | Status: DC
  Administered 2022-09-30 – 2022-10-02 (×4): 800 mg via ORAL
  Filled 2022-09-30 (×4): qty 4

## 2022-09-30 MED ORDER — ENALAPRIL MALEATE 2.5 MG PO TABS
5.0000 mg | ORAL_TABLET | Freq: Two times a day (BID) | ORAL | Status: DC
  Administered 2022-09-30 – 2022-10-02 (×4): 5 mg via ORAL
  Filled 2022-09-30 (×4): qty 2

## 2022-09-30 NOTE — Progress Notes (Signed)
Reviewed patient with Dr. Servando Salina who recommends dc hydralazine, switch labetalol to BID and start vastoec 5 mg BID. Will cont to monitor closely.   Baldemar Lenis, MD, [facility] for [employer] [facility])

## 2022-09-30 NOTE — Progress Notes (Signed)
Post Partum Day 1 Subjective: no complaints  Objective: Blood pressure 124/66, pulse 83, temperature 98.2 F (36.8 C), temperature source Oral, resp. rate 17, height 5\' 5"  (1.651 m), weight 88.9 kg, last menstrual period 01/18/2022, SpO2 98%, unknown if currently breastfeeding.  Physical Exam:  General: alert, cooperative, and no distress Lochia: appropriate Uterine Fundus: firm Incision:  DVT Evaluation: No evidence of DVT seen on physical exam.  Recent Labs    09/29/22 1404 09/30/22 0603  HGB 11.0* 9.1*  HCT 32.6* 27.5*    Assessment/Plan: D/C magnesium after 24 hr pp. BP well controlled. Fe supplement   LOS: 2 days   Scheryl Darter, MD 09/30/2022, 10:30 AM

## 2022-09-30 NOTE — Lactation Note (Signed)
This note was copied from a baby's chart. Lactation Consultation Note  Patient Name: Selena Meyer [id number] Date: 09/30/2022 Age:39 hours Reason for consult: Follow-up assessment;Infant < 6lbs;1st time breastfeeding;Primapara;Late-preterm 34-36.6wks;Maternal endocrine disorder  Visited P1 parent for follow-up consult. Mom does not have a pump at home, [facility] to refer for [facility] Pump. Mom declined latching baby at this time, as she is frustrated with feeding. FOB is holding baby swaddled upon entry. Mom still desires to supplement with formula and not DBM until her own milk comes in.  [facility] emphasized importance of pumping for mature milk to transition in. Mom confirmed understanding, but does not like to pump.    Feeding plan as instructed: -Latch infant to the breast. Infant may nurse for 15 min max if she will nurse at all. -Immediately supplement infant after breastfeeding as directed by crib card. -Feeding will not exceed 30 min total. -Nipple to be used is the Nfant White Slow flow nipple, until SLP or doctor says otherwise. -MOB will pump for stimulation after every breastfeeding for 15-20 min.   LC reviewed DEBP, pumping and milk storage guidelines. LC reviewed LC services handout, feeding infant 8-12x in 24 hrs, not allowing infant to go over 3 hrs without a feeding, and CDC milk storage guidelines.   Maternal Data Has patient been taught Hand Expression?: Yes Does the patient have breastfeeding experience prior to this delivery?: No  Feeding Mother's Current Feeding Choice: Breast Milk and Formula Nipple Type: Nfant Standard Flow (white)  Lactation Tools Discussed/Used Breast pump type: Double-Electric Breast Pump;Manual Pump Education: Milk Storage;Setup, frequency, and cleaning Reason for Pumping: LPT infant Pumping frequency: q 3 hours  Interventions Interventions: Breast feeding basics reviewed;Expressed milk;DEBP;Education;Pace feeding;LPT handout/interventions;LC Services  brochure;Guidelines for Milk Supply and Pumping Schedule Handout  Discharge Pump: Stork Pump  Consult Status Consult Status: Follow-up Date: 10/01/22 Follow-up type: In-patient    Antionette Char 09/30/2022, 12:13 PM

## 2022-09-30 NOTE — Anesthesia Postprocedure Evaluation (Signed)
Anesthesia Post Note  Patient: Selena Meyer  Procedure(s) Performed: AN AD HOC LABOR EPIDURAL     Patient location during evaluation: Mother Baby Anesthesia Type: Epidural Level of consciousness: awake and alert Pain management: pain level controlled Vital Signs Assessment: post-procedure vital signs reviewed and stable Respiratory status: spontaneous breathing, nonlabored ventilation and respiratory function stable Cardiovascular status: stable Postop Assessment: no headache, no backache and epidural receding Anesthetic complications: no   No notable events documented.  Last Vitals:  Vitals:   09/30/22 1100 09/30/22 1204  BP:  (!) 120/55  Pulse:  78  Resp: 17   Temp:  36.8 C  SpO2:  97%    Last Pain:  Vitals:   09/30/22 1204  TempSrc: Oral  PainSc:    Pain Goal: Patients Stated Pain Goal: 4 (09/29/22 1747)                 Rica Records

## 2022-10-01 MED ORDER — FERROUS SULFATE 325 (65 FE) MG PO TABS: 325.0000 mg | ORAL_TABLET | ORAL | 3 refills | Status: DC

## 2022-10-01 MED ORDER — FUROSEMIDE 20 MG PO TABS: 20.0000 mg | ORAL_TABLET | Freq: Every day | ORAL | 0 refills | Status: DC

## 2022-10-01 MED ORDER — NIFEDIPINE ER 60 MG PO TB24: 60.0000 mg | ORAL_TABLET | Freq: Two times a day (BID) | ORAL | 1 refills | Status: DC

## 2022-10-01 MED ORDER — LABETALOL HCL 200 MG PO TABS: 800.0000 mg | ORAL_TABLET | Freq: Two times a day (BID) | ORAL | 1 refills | Status: DC

## 2022-10-01 MED ORDER — ENALAPRIL MALEATE 5 MG PO TABS: 5.0000 mg | ORAL_TABLET | Freq: Two times a day (BID) | ORAL | 1 refills | Status: DC

## 2022-10-01 NOTE — Plan of Care (Signed)
  Problem: Education: Goal: Knowledge of General Education information will improve Description: Including pain rating scale, medication(s)/side effects and non-pharmacologic comfort measures Outcome: Progressing   Problem: Health Behavior/Discharge Planning: Goal: Ability to manage health-related needs will improve Outcome: Progressing   Problem: Clinical Measurements: Goal: Ability to maintain clinical measurements within normal limits will improve Outcome: Progressing Goal: Will remain free from infection Outcome: Progressing Goal: Diagnostic test results will improve Outcome: Progressing Goal: Respiratory complications will improve Outcome: Progressing Goal: Cardiovascular complication will be avoided Outcome: Progressing   Problem: Activity: Goal: Risk for activity intolerance will decrease Outcome: Progressing   Problem: Nutrition: Goal: Adequate nutrition will be maintained Outcome: Progressing   Problem: Coping: Goal: Level of anxiety will decrease Outcome: Progressing   Problem: Elimination: Goal: Will not experience complications related to bowel motility Outcome: Progressing Goal: Will not experience complications related to urinary retention Outcome: Progressing   Problem: Pain Managment: Goal: General experience of comfort will improve Outcome: Progressing   Problem: Safety: Goal: Ability to remain free from injury will improve Outcome: Progressing   Problem: Skin Integrity: Goal: Risk for impaired skin integrity will decrease Outcome: Progressing   Problem: Clinical Measurements: Goal: Complications related to the disease process, condition or treatment will be avoided or minimized Outcome: Progressing   Problem: Education: Goal: Knowledge of disease or condition will improve Outcome: Progressing Goal: Knowledge of the prescribed therapeutic regimen will improve Outcome: Progressing   Problem: Fluid Volume: Goal: Peripheral tissue perfusion  will improve Outcome: Progressing   Problem: Clinical Measurements: Goal: Complications related to disease process, condition or treatment will be avoided or minimized Outcome: Progressing   Problem: Education: Goal: Ability to describe self-care measures that may prevent or decrease complications (Diabetes Survival Skills Education) will improve Outcome: Progressing Goal: Individualized Educational Video(s) Outcome: Progressing   Problem: Coping: Goal: Ability to adjust to condition or change in health will improve Outcome: Progressing   Problem: Fluid Volume: Goal: Ability to maintain a balanced intake and output will improve Outcome: Progressing   Problem: Health Behavior/Discharge Planning: Goal: Ability to identify and utilize available resources and services will improve Outcome: Progressing Goal: Ability to manage health-related needs will improve Outcome: Progressing   Problem: Metabolic: Goal: Ability to maintain appropriate glucose levels will improve Outcome: Progressing   Problem: Nutritional: Goal: Maintenance of adequate nutrition will improve Outcome: Progressing Goal: Progress toward achieving an optimal weight will improve Outcome: Progressing   Problem: Skin Integrity: Goal: Risk for impaired skin integrity will decrease Outcome: Progressing   Problem: Tissue Perfusion: Goal: Adequacy of tissue perfusion will improve Outcome: Progressing   Problem: Education: Goal: Knowledge of condition will improve Outcome: Progressing Goal: Individualized Educational Video(s) Outcome: Progressing Goal: Individualized Newborn Educational Video(s) Outcome: Progressing   Problem: Activity: Goal: Will verbalize the importance of balancing activity with adequate rest periods Outcome: Progressing Goal: Ability to tolerate increased activity will improve Outcome: Progressing   Problem: Coping: Goal: Ability to identify and utilize available resources and  services will improve Outcome: Progressing   Problem: Life Cycle: Goal: Chance of risk for complications during the postpartum period will decrease Outcome: Progressing   Problem: Role Relationship: Goal: Ability to demonstrate positive interaction with newborn will improve Outcome: Progressing   Problem: Skin Integrity: Goal: Demonstration of wound healing without infection will improve Outcome: Progressing

## 2022-10-02 ENCOUNTER — Other Ambulatory Visit (HOSPITAL_COMMUNITY): Payer: Self-pay

## 2022-10-02 ENCOUNTER — Encounter: Payer: Self-pay | Admitting: Family

## 2022-10-02 ENCOUNTER — Telehealth: Payer: Self-pay

## 2022-10-02 LAB — COMPREHENSIVE METABOLIC PANEL
ALT: 22 U/L (ref 0–44)
AST: 33 U/L (ref 15–41)
Albumin: 2.3 g/dL — ABNORMAL LOW (ref 3.5–5.0)
Alkaline Phosphatase: 52 U/L (ref 38–126)
Anion gap: 8 (ref 5–15)
BUN: 12 mg/dL (ref 6–20)
CO2: 25 mmol/L (ref 22–32)
Calcium: 8.9 mg/dL (ref 8.9–10.3)
Chloride: 103 mmol/L (ref 98–111)
Creatinine, Ser: 1.19 mg/dL — ABNORMAL HIGH (ref 0.44–1.00)
GFR, Estimated: 60 mL/min — ABNORMAL LOW (ref >60)
Glucose, Bld: 84 mg/dL (ref 70–99)
Potassium: 3.7 mmol/L (ref 3.5–5.1)
Sodium: 136 mmol/L (ref 135–145)
Total Bilirubin: 0.4 mg/dL (ref 0.3–1.2)
Total Protein: 5.9 g/dL — ABNORMAL LOW (ref 6.5–8.1)

## 2022-10-02 MED ORDER — FERROUS SULFATE 325 (65 FE) MG PO TABS
325.0000 mg | ORAL_TABLET | ORAL | 3 refills | Status: AC
  Filled 2022-10-02: qty 100, 200d supply, fill #0

## 2022-10-02 MED ORDER — FUROSEMIDE 20 MG PO TABS
20.0000 mg | ORAL_TABLET | Freq: Every day | ORAL | 0 refills | Status: DC
  Filled 2022-10-02: qty 5, 5d supply, fill #0

## 2022-10-02 MED ORDER — ENALAPRIL MALEATE 10 MG PO TABS
5.0000 mg | ORAL_TABLET | Freq: Two times a day (BID) | ORAL | 1 refills | Status: DC
  Filled 2022-10-02: qty 30, 30d supply, fill #0

## 2022-10-02 MED ORDER — LABETALOL HCL 200 MG PO TABS
800.0000 mg | ORAL_TABLET | Freq: Two times a day (BID) | ORAL | 1 refills | Status: DC
  Filled 2022-10-02: qty 60, 8d supply, fill #0

## 2022-10-02 MED ORDER — NIFEDIPINE ER 60 MG PO TB24
60.0000 mg | ORAL_TABLET | Freq: Two times a day (BID) | ORAL | 1 refills | Status: DC
  Filled 2022-10-02: qty 60, 30d supply, fill #0

## 2022-10-02 NOTE — Discharge Summary (Signed)
Postpartum Discharge Summary     Patient Name: Selena Meyer DOB: 1986-01-04 MRN: [id number]  Date of admission: 09/28/2022 Delivery date:09/29/2022 Delivering provider: Jaynie Collins A Date of discharge: 10/02/2022  Admitting diagnosis: Encounter for induction of labor [Z34.90] Intrauterine pregnancy: [redacted]w[redacted]d     Secondary diagnosis:  Principal Problem:   Severe pre-eclampsia, with delivery Active Problems:   FSGS (focal segmental glomerulosclerosis)   Gestational diabetes mellitus (GDM), antepartum   Chronic hypertension during pregnancy, antepartum   Kidney disease in pregnancy, unspecified trimester   Encounter for induction of labor  Chronic renal insufficiency  Additional problems: none    Discharge diagnosis: Preterm Pregnancy Delivered, Preeclampsia (severe), CHTN, GDM A2, and Anemia                                              Post partum procedures: Magnesium x 24 hours, BP control Augmentation: AROM, Pitocin, and Cytotec Complications: None  Hospital course: Induction of Labor With Vaginal Delivery   37 y.o. yo G1P0101 at [redacted]w[redacted]d was admitted to the hospital 09/28/2022 for induction of labor.  Indication for induction: Preeclampsia and A2 DM.  Patient had an labor course complicated by none. Membrane Rupture Time/Date: 2:14 AM,09/29/2022  Delivery Method:Vaginal, Spontaneous Operative Delivery:N/A Episiotomy: None Lacerations:  Vaginal Details of delivery can be found in separate delivery note.  Patient had a postpartum course complicated by Magnesium x 24 hour, titration of BP meds by Cardio/OB. Pp glucose WNL. Patient is discharged home 10/02/22.  Newborn Data: Birth date:09/29/2022 Birth time:1:15 PM Gender:Female Living status:Living Apgars:6 ,8  Weight:2080 g  Magnesium Sulfate received: Yes: Seizure prophylaxis BMZ received: No Rhophylac:No MMR:N/A T-DaP:Given prenatally Flu: No RSV Vaccine received: No Transfusion:No  Immunizations  received: Immunization History  Administered Date(s) Administered   PPD Test 07/16/2017   Tdap 01/10/2015, 08/17/2022    Physical exam  Vitals:   10/01/22 1714 10/01/22 1915 10/01/22 2322 10/02/22 0246  BP: 134/78 (!) 144/78 (!) 152/85 (!) 140/72  Pulse: 78 76 81 82  Resp: 18 18  18   Temp: 98.2 F (36.8 C) 98.6 F (37 C) 98.4 F (36.9 C) 98.2 F (36.8 C)  TempSrc: Oral Oral Oral Oral  SpO2: 97% 98%  100%  Weight:      Height:       General: alert, cooperative, and no distress Lochia: appropriate Uterine Fundus: firm DVT Evaluation: No evidence of DVT seen on physical exam. Labs: Lab Results  Component Value Date   WBC 16.6 (H) 09/30/2022   HGB 9.1 (L) 09/30/2022   HCT 27.5 (L) 09/30/2022   MCV 89.6 09/30/2022   PLT 338 09/30/2022      Latest Ref Rng & Units 09/30/2022    6:03 AM  CMP  Glucose 70 - 99 mg/dL 884   BUN 6 - 20 mg/dL 16   Creatinine 1.66 - 1.00 mg/dL 0.63   Sodium 016 - 010 mmol/L 133   Potassium 3.5 - 5.1 mmol/L 4.3   Chloride 98 - 111 mmol/L 103   CO2 22 - 32 mmol/L 21   Calcium 8.9 - 10.3 mg/dL 8.3   Total Protein 6.5 - 8.1 g/dL 5.8   Total Bilirubin 0.3 - 1.2 mg/dL 0.2   Alkaline Phos 38 - 126 U/L 52   AST 15 - 41 U/L 25   ALT 0 - 44 U/L 16  Edinburgh Score:    10/01/2022    8:30 PM  Edinburgh Postnatal Depression Scale Screening Tool  I have been able to laugh and see the funny side of things. 0  I have looked forward with enjoyment to things. 0  I have blamed myself unnecessarily when things went wrong. 2  I have been anxious or worried for no good reason. 2  I have felt scared or panicky for no good reason. 1  Things have been getting on top of me. 1  I have been so unhappy that I have had difficulty sleeping. 0  I have felt sad or miserable. 0  I have been so unhappy that I have been crying. 0  The thought of harming myself has occurred to me. 0  Edinburgh Postnatal Depression Scale Total 6   Edinburgh Postnatal Depression  Scale Total: 6   After visit meds:  Allergies as of 10/02/2022       Reactions   Ibuprofen Hives        Medication List     STOP taking these medications    Accu-Chek Guide test strip Generic drug: glucose blood   Accu-Chek Softclix Lancets lancets   aspirin EC 81 MG tablet   FreeStyle Libre 3 Sensor Misc   hydrALAZINE 25 MG tablet Commonly known as: APRESOLINE   metoCLOPramide 10 MG tablet Commonly known as: REGLAN   NovoLOG FlexPen 100 UNIT/ML FlexPen Generic drug: insulin aspart   ondansetron 8 MG tablet Commonly known as: Zofran   Semglee (yfgn) 100 UNIT/ML Pen Generic drug: insulin glargine-yfgn   sucralfate 1 g tablet Commonly known as: Carafate   TechLite Plus Pen Needles 32G X 4 MM Misc Generic drug: Insulin Pen Needle       TAKE these medications    albuterol 108 (90 Base) MCG/ACT inhaler Commonly known as: VENTOLIN HFA Inhale 2 puffs into the lungs every 6 (six) hours as needed for wheezing or shortness of breath.   enalapril 5 MG tablet Commonly known as: VASOTEC Take 1 tablet (5 mg total) by mouth 2 (two) times daily.   ferrous sulfate 325 (65 FE) MG tablet Take 1 tablet (325 mg total) by mouth every other day. What changed:  how much to take when to take this   furosemide 20 MG tablet Commonly known as: LASIX Take 1 tablet (20 mg total) by mouth daily.   ipratropium 0.06 % nasal spray Commonly known as: ATROVENT Place 2 sprays into both nostrils 4 (four) times daily. As needed for runny nose / postnasal drip   labetalol 200 MG tablet Commonly known as: NORMODYNE Take 4 tablets (800 mg total) by mouth 2 (two) times daily. What changed: when to take this   MAGnesium-Oxide 400 (240 Mg) MG tablet Generic drug: magnesium oxide Take 0.5 tablets by mouth daily.   multivitamin-prenatal 27-0.8 MG Tabs tablet Take 1 tablet by mouth daily at 12 noon.   NIFEdipine 60 MG 24 hr tablet Commonly known as: ADALAT CC Take 1 tablet (60  mg total) by mouth 2 (two) times daily. What changed: when to take this   omeprazole 40 MG capsule Commonly known as: PRILOSEC Take 1 capsule (40 mg total) by mouth daily.         Discharge home in stable condition Infant Feeding: Breast Infant Disposition:home with mother Discharge instruction: per After Visit Summary and Postpartum booklet. Activity: Advance as tolerated. Pelvic rest for 6 weeks.  Diet: low salt diet and renal  Future Appointments: Future Appointments  Date Time Provider Department Center  10/05/2022  9:30 AM Lennart Pall, MD [facility]  10/11/2022 11:30 AM Tobb, Lavona Mound, DO [facility] None  10/16/2022  8:20 AM Tobb, Lavona Mound, DO [facility] None  10/30/2022  4:10 PM Milas Hock, MD [facility]  11/23/2022 11:00 AM [facility] None  11/23/2022 11:15 AM Rushie Chestnut, PA-C [facility] None   Follow up Visit:  Follow-up Information     Tobb, Kardie, DO Follow up in 5 day(s).   Specialty: Cardiology Contact information: [address] [place] [address] [place] [address] [phone]         [facility] for [facility] Follow up in 1 week(s).   Specialty: Obstetrics and Gynecology Why: blood pressure check Contact information: [address] [place] [address] [phone]                 Please schedule this patient for a In person postpartum visit in 1 week with the following provider: MD. Additional Postpartum F/U:2 hour GTT and BP check 1 week  High risk pregnancy complicated by: GDM and HTN Delivery mode:  Vaginal, Spontaneous Anticipated Birth Control:  Unsure   10/02/2022 Reva Bores, MD

## 2022-10-02 NOTE — Lactation Note (Signed)
This note was copied from a baby's chart. Lactation Consultation Note  Patient Name: Selena Meyer [id number] Date: 10/02/2022 Age:38 hours   LC in to visit with P1 Mom of LPTI weighing 4 lbs 9.4 oz at birth.  Baby is at a 2.4% weight loss which is stable from yesterday.  Baby's volume is increasing, but she did cluster feed last night and feed smaller amounts every 1-2 hrs.  Baby showing cues.  Mom states baby had already breast fed for 5 mins before last feeding and declined LC assist.  Mom reports a painful latch at times.    LC assisted Mom to feed baby and she consumed 30 ml of 22 cal formula with a Dr. Theora Gianotti with Nfant Slow flow nipple.  Baby fed well without any stress cues.  Assisted Mom with burping frequently.  LC assisted Mom with her 2nd pumping.  LC provided a pumping top to assist with ease of pumping.  Baby has been latching to her right side only.    Educated Mom on the importance of consistent pumping if she does want to breastfeed.  LC set up washing and drying bins as Mom had packed all the parts up.  Plan recommended- 1- STS with baby as much as possible 2-offer the breast with cues, making sure latch is deep (ask for help prn) 3- Supplement per volume guidelines adding more if baby remains hungry 4- Pump both breasts on initiation setting, goal of 8 times per 24 hrs.   Lactation Tools Discussed/Used Tools: Pump;Flanges;Hands-free pumping top;Bottle Flange Size: 21 Breast pump type: Double-Electric Breast Pump Pump Education: Setup, frequency, and cleaning;Milk Storage Reason for Pumping: support milk supply Pumping frequency: Mom has pumped once, encouraged consistent pumping to support milk supply Pumped volume: 0 mL  Interventions Interventions: Breast feeding basics reviewed;Skin to skin;Breast massage;Hand express;DEBP;Hand pump;Pace feeding  Discharge Discharge Education: Engorgement and breast care;Outpatient recommendation Pump: Stork Pump  [employer]!)  Consult Status Consult Status: Follow-up Date: 10/03/22 Follow-up type: In-patient    Judee Clara 10/02/2022, 11:38 AM

## 2022-10-02 NOTE — Plan of Care (Signed)

## 2022-10-02 NOTE — Telephone Encounter (Addendum)
Called pt to check in on her since she delivered Friday. Appt made for Friday with Dr. Servando Salina.

## 2022-10-02 NOTE — Plan of Care (Signed)

## 2022-10-03 LAB — SURGICAL PATHOLOGY

## 2022-10-04 ENCOUNTER — Inpatient Hospital Stay (HOSPITAL_COMMUNITY)
Admission: AD | Admit: 2022-10-04 | Payer: BC Managed Care – PPO | Source: Home / Self Care | Admitting: Obstetrics & Gynecology

## 2022-10-04 ENCOUNTER — Inpatient Hospital Stay (HOSPITAL_COMMUNITY): Payer: BC Managed Care – PPO

## 2022-10-05 ENCOUNTER — Ambulatory Visit (INDEPENDENT_AMBULATORY_CARE_PROVIDER_SITE_OTHER): Payer: BC Managed Care – PPO | Admitting: Obstetrics and Gynecology

## 2022-10-05 ENCOUNTER — Encounter: Payer: Self-pay | Admitting: Obstetrics and Gynecology

## 2022-10-05 DIAGNOSIS — Z0131 Encounter for examination of blood pressure with abnormal findings: Secondary | ICD-10-CM | POA: Diagnosis not present

## 2022-10-05 DIAGNOSIS — O10919 Unspecified pre-existing hypertension complicating pregnancy, unspecified trimester: Secondary | ICD-10-CM

## 2022-10-05 DIAGNOSIS — O1414 Severe pre-eclampsia complicating childbirth: Secondary | ICD-10-CM

## 2022-10-05 DIAGNOSIS — I471 Supraventricular tachycardia, unspecified: Secondary | ICD-10-CM

## 2022-10-05 DIAGNOSIS — O26839 Pregnancy related renal disease, unspecified trimester: Secondary | ICD-10-CM

## 2022-10-05 DIAGNOSIS — I517 Cardiomegaly: Secondary | ICD-10-CM

## 2022-10-05 DIAGNOSIS — N051 Unspecified nephritic syndrome with focal and segmental glomerular lesions: Secondary | ICD-10-CM

## 2022-10-05 NOTE — Progress Notes (Signed)
   POSTPARTUM VISIT  Subjective:  Selena Meyer is a 37 y.o. G1P0101 who is 6 days PP s/p SVD presenting for BP check  Doing well overall. Still working out a sleep schedule and getting comfortable with a new baby.  Is taking enalapril 5mg  BID, procardia 60 BID, and labetalol 800 BID. Also taking lasix. BP is elevated today, but she is asymptomatic. No HA, vis changes, RUQ/epigastric pain. No significant pedal edema. No CP/SOB.   Objective:   Vitals:   10/05/22 1318 10/05/22 1347  BP: (!) 154/80 (!) 151/82  Pulse: 80 78  Weight: 180 lb (81.6 kg)   Height: 5\' 5"  (1.651 m)     General:  Alert, oriented and cooperative. Patient is in no acute distress.  Skin: Skin is warm and dry. No rash noted.   Cardiovascular: Normal heart rate noted  Respiratory: Normal respiratory effort, no problems with respiration noted  Abdomen: Soft, non-tender, non-distended   Extremities: No significant lower extremity edema  Exam performed in the presence of a chaperone  Assessment and Plan:  Selena Meyer is a 37 y.o. with cHTN, FSGS/CKD, LVH/SVT presenting for PP BP check.   Hypertensive today. Asymptomatic. No e/o fluid overload on exam. Will increase labetalol to 800mg  TID  Continue enalapril 5mg  BID, procardia 60 BID and lasix  Follow up with Dr. Servando Salina as scheduled tomorrow (9/27)  Future Appointments  Date Time Provider Department Center  10/06/2022  1:20 PM Tobb, Kardie, DO [facility] None  10/11/2022 11:30 AM Tobb, Lavona Mound, DO [facility] None  10/16/2022  8:20 AM Tobb, Lavona Mound, DO [facility] None  11/15/2022  8:30 AM Milas Hock, MD [facility]  11/23/2022 11:00 AM [facility] None  11/23/2022 11:15 AM Rushie Chestnut, PA-C [facility] None   Lennart Pall, MD

## 2022-10-06 ENCOUNTER — Ambulatory Visit: Payer: BC Managed Care – PPO | Attending: Cardiology | Admitting: Cardiology

## 2022-10-06 ENCOUNTER — Encounter: Payer: Self-pay | Admitting: Cardiology

## 2022-10-06 ENCOUNTER — Telehealth: Payer: Self-pay

## 2022-10-06 VITALS — BP 104/71 | HR 87 | Ht 65.0 in | Wt 180.0 lb

## 2022-10-06 DIAGNOSIS — O10919 Unspecified pre-existing hypertension complicating pregnancy, unspecified trimester: Secondary | ICD-10-CM

## 2022-10-06 DIAGNOSIS — I471 Supraventricular tachycardia, unspecified: Secondary | ICD-10-CM | POA: Diagnosis not present

## 2022-10-06 NOTE — Progress Notes (Unsigned)
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    Gestational diabetes    Hypertension    Hypokalemia 11/02/2020   Irregular menstrual cycle    Irregular periods/menstrual cycles 05/03/2017   Will hold off on Provera or other fertility stuff for now, pt advised pregnancy is not recommended until BP under better control at least, plus other workup pen   Polycystic ovaries 11/09/2015   Proteinuria 02/28/2013   Stage 2 chronic kidney disease 04/27/2020   SVT (supraventricular tachycardia) 11/01/2020   Systolic murmur 07/16/2017   Tobacco abuse 02/28/2013   Uncontrolled hypertension 05/10/2017    Past Surgical History:  Procedure Laterality Date   NO PAST SURGERIES     { Click here to update PMH, PSH, OB Hx then refresh note  :1}   OB History     Gravida  1   Para  1   Term      Preterm  1   AB      Living  1      SAB      IAB      Ectopic      Multiple  0   Live Births  1           { Click here to update OB Charting then refresh note  :1}    Current Medications: Current Meds  Medication Sig   acetaminophen (TYLENOL) 500 MG tablet Take 500 mg by mouth as needed.   albuterol (VENTOLIN HFA) 108 (90 Base) MCG/ACT inhaler Inhale 2 puffs into the lungs every 6 (six) hours as needed for wheezing or shortness of breath.   enalapril (VASOTEC) 10 MG tablet Take 0.5 tablets (5 mg total) by mouth 2 (two) times daily.   ferrous sulfate 325 (65 FE) MG tablet Take 1 tablet (325 mg total) by mouth every other day.    furosemide (LASIX) 20 MG tablet Take 1 tablet (20 mg total) by mouth daily.   ipratropium (ATROVENT) 0.06 % nasal spray Place 2 sprays into both nostrils 4 (four) times daily. As needed for runny nose / postnasal drip   labetalol (NORMODYNE) 200 MG tablet Take 4 tablets (800 mg total) by mouth 2 (two) times daily.   NIFEdipine (ADALAT CC) 60 MG 24 hr tablet Take 1 tablet (60 mg total) by mouth 2 (two) times daily.   omeprazole (PRILOSEC) 40 MG capsule Take 1 capsule (40 mg total) by mouth daily.   Prenatal Vit-Fe Fumarate-FA (MULTIVITAMIN-PRENATAL) 27-0.8 MG TABS tablet Take 1 tablet by mouth daily at 12 noon.     Allergies:   Ibuprofen   Social History   Socioeconomic History   Marital status: Married    Spouse name: Ivar Drape   Number of children: 0   Years of education: Not on file   Highest education level: Not on file  Occupational History    Employer: UNEMPLOYED  Tobacco Use   Smoking status: Former    Current packs/day: 0.00    Average packs/day: 0.3 packs/day for 5.0 years (1.3 ttl pk-yrs)    Types: Cigarettes    Start date:  05/16/2016    Quit date: 05/16/2021    Years since quitting: 1.3   Smokeless tobacco: Never   Tobacco comments:    Quit smoking 06/17/23after her mom passed away  Vaping Use   Vaping status: Never Used  Substance and Sexual Activity   Alcohol use: Not Currently    Comment: "occasional use; social drinker"   Drug use: No   Sexual activity: Yes    Partners: Male    Birth control/protection: None    Comment: Pregnant  Other Topics Concern   Not on file  Social History Narrative   Not on file   Social Determinants of Health   Financial Resource Strain: Low Risk  (05/08/2022)   Received from [facility], [facility]   Overall Financial Resource Strain (CARDIA)    Difficulty of Paying Living Expenses: Not very hard  Food Insecurity: No Food Insecurity (09/28/2022)   Hunger Vital Sign    Worried About Running Out of Food in the Last Year: Never  true    Ran Out of Food in the Last Year: Never true  Transportation Needs: No Transportation Needs (09/28/2022)   PRAPARE - Administrator, Civil Service (Medical): No    Lack of Transportation (Non-Medical): No  Physical Activity: Unknown (11/03/2020)   Received from [facility], [facility]   Exercise Vital Sign    Days of Exercise per Week: 5 days    Minutes of Exercise per Session: Patient declined  Stress: Stress Concern Present (11/03/2020)   Received from [facility], [facility] of Occupational Health - Occupational Stress Questionnaire    Feeling of Stress : To some extent  Social Connections: Unknown (05/10/2021)   Received from [facility], [facility]   Social Network    Social Network: Not on file  { Click here to update SDOH then refresh :1}    Family History  Problem Relation Age of Onset   Diabetes Mother    High blood pressure Mother    Renal Disease Mother    Diabetes Sister    Cancer Maternal Grandmother    Diabetes Maternal Grandmother    High blood pressure Maternal Grandmother    Cancer Maternal Aunt        breast   Other Father    { Click here to update FH then refresh note    :1}   ROS:   Please see the history of present illness.    *** All other systems reviewed and are negative.   Labs/EKG Reviewed:    EKG:   EKG is *** ordered today.  The ekg ordered today demonstrates ***  Recent Labs: 08/03/2022: TSH 1.010 09/30/2022: Hemoglobin 9.1; Magnesium 4.9; Platelets 338 10/02/2022: ALT 22; BUN 12; Creatinine, Ser 1.19; Potassium 3.7; Sodium 136   Recent Lipid Panel Lab Results  Component Value Date/Time   CHOL 240 (H) 11/15/2021 12:00 AM   TRIG 492 (H) 11/15/2021 12:00 AM   HDL 40 (L) 11/15/2021 12:00 AM   CHOLHDL 6.0 (H) 11/15/2021 12:00 AM   LDLCALC  11/15/2021 12:00 AM     Comment:     . LDL cholesterol not calculated. Triglyceride levels greater than 400 mg/dL invalidate calculated LDL  results. . Reference range: <100 . Desirable range <100 mg/dL for primary prevention;   <70 mg/dL for patients with CHD or diabetic patients  with > or = 2 CHD risk factors. [name] LDL-C is now calculated using the Martin-Hopkins  calculation, which  is a validated novel method providing  better accuracy than the Friedewald equation in the  estimation of LDL-C.  Horald Pollen et al. Lenox Ahr. 1610;960(45): 2061-2068  (http://education.[facility].com/faq/FAQ164)     Physical Exam:    VS:  BP 104/71 (BP Location: Right Arm, Patient Position: Sitting, Cuff Size: Normal)   Pulse 87   Ht 5\' 5"  (1.651 m)   Wt 180 lb (81.6 kg)   LMP 01/18/2022   BMI 29.95 kg/m     Wt Readings from Last 3 Encounters:  10/06/22 180 lb (81.6 kg)  10/05/22 180 lb (81.6 kg)  09/28/22 196 lb (88.9 kg)     GEN: *** Well nourished, well developed in no acute distress HEENT: Normal NECK: No JVD; No carotid bruits LYMPHATICS: No lymphadenopathy CARDIAC: ***RRR, no murmurs, rubs, gallops RESPIRATORY:  Clear to auscultation without rales, wheezing or rhonchi  ABDOMEN: Soft, non-tender, non-distended MUSCULOSKELETAL:  No edema; No deformity  SKIN: Warm and dry NEUROLOGIC:  Alert and oriented x 3 PSYCHIATRIC:  Normal affect    Risk Assessment/Risk Calculators:   { Click to calculate CARPREG II - THEN refresh note :1}    { Click to caclulate Mod WHO Class of CV Risk - THEN refresh note :1}     { Click for CHADS2VASc Score - THEN Refresh Note    :[id number]}      ASSESSMENT & PLAN:    *** Patient Instructions  Medication Instructions:  Your physician recommends that you continue on your current medications as directed. Please refer to the Current Medication list given to you today.  *If you need a refill on your cardiac medications before your next appointment, please call your pharmacy*   Lab Work: None    Testing/Procedures: None   Follow-Up: At [facility], you and your health  needs are our priority.  As part of our continuing mission to provide you with exceptional heart care, we have created designated Provider Care Teams.  These Care Teams include your primary Cardiologist (physician) and Advanced Practice Providers (APPs -  Physician Assistants and Nurse Practitioners) who all work together to provide you with the care you need, when you need it.     Your next appointment:   Keep appt next week  Provider:   Thomasene Ripple, DO       Dispo:  No follow-ups on file.   Medication Adjustments/Labs and Tests Ordered: Current medicines are reviewed at length with the patient today.  Concerns regarding medicines are outlined above.  Tests Ordered: No orders of the defined types were placed in this encounter.  Medication Changes: No orders of the defined types were placed in this encounter.   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    Gestational diabetes    Hypertension    Hypokalemia 11/02/2020   Irregular menstrual cycle    Irregular periods/menstrual cycles 05/03/2017   Will hold off on Provera or other fertility stuff for now, pt advised pregnancy is not recommended until BP under better control at least, plus other workup pen   Polycystic ovaries 11/09/2015   Proteinuria 02/28/2013  Stage 2 chronic kidney disease 04/27/2020   SVT (supraventricular tachycardia) 11/01/2020   Systolic murmur 07/16/2017   Tobacco abuse 02/28/2013   Uncontrolled hypertension 05/10/2017    Past Surgical History:   Procedure Laterality Date   NO PAST SURGERIES     { Click here to update PMH, PSH, OB Hx then refresh note  :1}   OB History     Gravida  1   Para  1   Term      Preterm  1   AB      Living  1      SAB      IAB      Ectopic      Multiple  0   Live Births  1           { Click here to update OB Charting then refresh note  :1}    Current Medications: Current Meds  Medication Sig   acetaminophen (TYLENOL) 500 MG tablet Take 500 mg by mouth as needed.   albuterol (VENTOLIN HFA) 108 (90 Base) MCG/ACT inhaler Inhale 2 puffs into the lungs every 6 (six) hours as needed for wheezing or shortness of breath.   enalapril (VASOTEC) 10 MG tablet Take 0.5 tablets (5 mg total) by mouth 2 (two) times daily.   ferrous sulfate 325 (65 FE) MG tablet Take 1 tablet (325 mg total) by mouth every other day.   furosemide (LASIX) 20 MG tablet Take 1 tablet (20 mg total) by mouth daily.   ipratropium (ATROVENT) 0.06 % nasal spray Place 2 sprays into both nostrils 4 (four) times daily. As needed for runny nose / postnasal drip   labetalol (NORMODYNE) 200 MG tablet Take 4 tablets (800 mg total) by mouth 2 (two) times daily.   NIFEdipine (ADALAT CC) 60 MG 24 hr tablet Take 1 tablet (60 mg total) by mouth 2 (two) times daily.   omeprazole (PRILOSEC) 40 MG capsule Take 1 capsule (40 mg total) by mouth daily.   Prenatal Vit-Fe Fumarate-FA (MULTIVITAMIN-PRENATAL) 27-0.8 MG TABS tablet Take 1 tablet by mouth daily at 12 noon.     Allergies:   Ibuprofen   Social History   Socioeconomic History   Marital status: Married    Spouse name: Ivar Drape   Number of children: 0   Years of education: Not on file   Highest education level: Not on file  Occupational History    Employer: UNEMPLOYED  Tobacco Use   Smoking status: Former    Current packs/day: 0.00    Average packs/day: 0.3 packs/day for 5.0 years (1.3 ttl pk-yrs)    Types: Cigarettes    Start date: 05/16/2016    Quit date: 05/16/2021     Years since quitting: 1.3   Smokeless tobacco: Never   Tobacco comments:    Quit smoking May 20, 2023after her mom passed away  Vaping Use   Vaping status: Never Used  Substance and Sexual Activity   Alcohol use: Not Currently    Comment: "occasional use; social drinker"   Drug use: No   Sexual activity: Yes    Partners: Male    Birth control/protection: None    Comment: Pregnant  Other Topics Concern   Not on file  Social History Narrative   Not on file   Social Determinants of Health   Financial Resource Strain: Low Risk  (05/08/2022)   Received from [facility], [facility]   Overall Financial Resource Strain (CARDIA)    Difficulty of Paying  Living Expenses: Not very hard  Food Insecurity: No Food Insecurity (09/28/2022)   Hunger Vital Sign    Worried About Running Out of Food in the Last Year: Never true    Ran Out of Food in the Last Year: Never true  Transportation Needs: No Transportation Needs (09/28/2022)   PRAPARE - Administrator, Civil Service (Medical): No    Lack of Transportation (Non-Medical): No  Physical Activity: Unknown (11/03/2020)   Received from [facility], [facility]   Exercise Vital Sign    Days of Exercise per Week: 5 days    Minutes of Exercise per Session: Patient declined  Stress: Stress Concern Present (11/03/2020)   Received from [facility], [facility] of Occupational Health - Occupational Stress Questionnaire    Feeling of Stress : To some extent  Social Connections: Unknown (05/10/2021)   Received from [facility], [facility]   Social Network    Social Network: Not on file  { Click here to update SDOH then refresh :1}    Family History  Problem Relation Age of Onset   Diabetes Mother    High blood pressure Mother    Renal Disease Mother    Diabetes Sister    Cancer Maternal Grandmother    Diabetes Maternal Grandmother    High blood pressure Maternal Grandmother    Cancer  Maternal Aunt        breast   Other Father    { Click here to update FH then refresh note    :1}   ROS:   Please see the history of present illness.    *** All other systems reviewed and are negative.   Labs/EKG Reviewed:    EKG:   EKG is *** ordered today.  The ekg ordered today demonstrates ***  Recent Labs: 08/03/2022: TSH 1.010 09/30/2022: Hemoglobin 9.1; Magnesium 4.9; Platelets 338 10/02/2022: ALT 22; BUN 12; Creatinine, Ser 1.19; Potassium 3.7; Sodium 136   Recent Lipid Panel Lab Results  Component Value Date/Time   CHOL 240 (H) 11/15/2021 12:00 AM   TRIG 492 (H) 11/15/2021 12:00 AM   HDL 40 (L) 11/15/2021 12:00 AM   CHOLHDL 6.0 (H) 11/15/2021 12:00 AM   LDLCALC  11/15/2021 12:00 AM     Comment:     . LDL cholesterol not calculated. Triglyceride levels greater than 400 mg/dL invalidate calculated LDL results. . Reference range: <100 . Desirable range <100 mg/dL for primary prevention;   <70 mg/dL for patients with CHD or diabetic patients  with > or = 2 CHD risk factors. [name] LDL-C is now calculated using the Martin-Hopkins  calculation, which is a validated novel method providing  better accuracy than the Friedewald equation in the  estimation of LDL-C.  Horald Pollen et al. Lenox Ahr. 1610;960(45): 2061-2068  (http://education.[facility].com/faq/FAQ164)     Physical Exam:    VS:  BP 104/71 (BP Location: Right Arm, Patient Position: Sitting, Cuff Size: Normal)   Pulse 87   Ht 5\' 5"  (1.651 m)   Wt 180 lb (81.6 kg)   LMP 01/18/2022   BMI 29.95 kg/m     Wt Readings from Last 3 Encounters:  10/06/22 180 lb (81.6 kg)  10/05/22 180 lb (81.6 kg)  09/28/22 196 lb (88.9 kg)     GEN: *** Well nourished, well developed in no acute distress HEENT: Normal NECK: No JVD; No carotid bruits LYMPHATICS: No lymphadenopathy CARDIAC: ***RRR, no murmurs, rubs, gallops RESPIRATORY:  Clear to auscultation without  rales, wheezing or rhonchi  ABDOMEN: Soft, non-tender,  non-distended MUSCULOSKELETAL:  No edema; No deformity  SKIN: Warm and dry NEUROLOGIC:  Alert and oriented x 3 PSYCHIATRIC:  Normal affect    Risk Assessment/Risk Calculators:   { Click to calculate CARPREG II - THEN refresh note :1}    { Click to caclulate Mod WHO Class of CV Risk - THEN refresh note :1}     { Click for CHADS2VASc Score - THEN Refresh Note    :[id number]}      ASSESSMENT & PLAN:    *** There are no Patient Instructions on file for this visit.   Dispo:  No follow-ups on file.   Medication Adjustments/Labs and Tests Ordered: Current medicines are reviewed at length with the patient today.  Concerns regarding medicines are outlined above.  Tests Ordered: No orders of the defined types were placed in this encounter.  Medication Changes: No orders of the defined types were placed in this encounter.

## 2022-10-06 NOTE — Patient Instructions (Signed)
Medication Instructions:  Your physician recommends that you continue on your current medications as directed. Please refer to the Current Medication list given to you today.  *If you need a refill on your cardiac medications before your next appointment, please call your pharmacy*   Lab Work: None    Testing/Procedures: None   Follow-Up: At [facility], you and your health needs are our priority.  As part of our continuing mission to provide you with exceptional heart care, we have created designated Provider Care Teams.  These Care Teams include your primary Cardiologist (physician) and Advanced Practice Providers (APPs -  Physician Assistants and Nurse Practitioners) who all work together to provide you with the care you need, when you need it.     Your next appointment:   Keep appt next week  Provider:   Thomasene Ripple, DO

## 2022-10-06 NOTE — Telephone Encounter (Signed)
Called pt to change her in person appt to MyChart.

## 2022-10-10 DIAGNOSIS — Z419 Encounter for procedure for purposes other than remedying health state, unspecified: Secondary | ICD-10-CM | POA: Diagnosis not present

## 2022-10-11 ENCOUNTER — Ambulatory Visit: Payer: BC Managed Care – PPO | Attending: Cardiology | Admitting: Cardiology

## 2022-10-11 ENCOUNTER — Encounter: Payer: Self-pay | Admitting: Cardiology

## 2022-10-11 VITALS — BP 102/84 | HR 81 | Ht 65.0 in | Wt 174.4 lb

## 2022-10-11 DIAGNOSIS — I10 Essential (primary) hypertension: Secondary | ICD-10-CM | POA: Diagnosis not present

## 2022-10-11 NOTE — Progress Notes (Signed)
Cardio[facility]st[facility]s Clinic  Follow Up Note   Date:  10/11/2022   ID:  Selena Meyer, DOB 03/12/1985, MRN [id number]  PCP:  Selena Butter, NP   [facility] Providers Cardiologist:  Selena Ripple, DO  Electrophysiologist:  None        Referring MD: Selena Butter, NP   Chief Complaint: " I am doing well"  History of Present Illness:    Selena Meyer is a 37 y.o. female [G1P0101] who returns for follow up for postpartum cardiovascular care.    Medical includes SVT, Hypertension, hypertriglyceridemia, FSGS, gestational diabetes.    Her recent postpartum visit with me was October 06, 2022 at that time her blood pressure was at target no medication changes were made.  Continue her enalapril 5 mg daily, labetalol 800 mg twice daily as well as nifedipine.  She is here today with the baby.  She open no complaints.  She has been doing well.  She tell me her blood pressure has been with systolic ranging from the 110s to 120s.   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    Gestational diabetes    Hypertension    Hypokalemia 11/02/2020   Irregular menstrual cycle    Irregular periods/menstrual cycles 05/03/2017   Will hold off on Provera or other fertility stuff for now, pt advised pregnancy is not recommended until BP under better control at least, plus other workup pen   Polycystic ovaries 11/09/2015   Proteinuria 02/28/2013   Stage 2 chronic kidney disease 04/27/2020   SVT (supraventricular tachycardia) (HCC) 11/01/2020   Systolic murmur 07/16/2017   Tobacco abuse 02/28/2013   Uncontrolled hypertension  05/10/2017    Past Surgical History:  Procedure Laterality Date   NO PAST SURGERIES        OB History     Gravida  1   Para  1   Term      Preterm  1   AB      Living  1      SAB      IAB      Ectopic      Multiple  0   Live Births  1               Current Medications: Current Meds  Medication Sig   acetaminophen (TYLENOL) 500 MG tablet Take 500 mg by mouth as needed.   albuterol (VENTOLIN HFA) 108 (90 Base) MCG/ACT inhaler Inhale 2 puffs into the lungs every 6 (six) hours as needed for wheezing or shortness of breath.   enalapril (VASOTEC) 10 MG tablet Take 0.5 tablets (5 mg total) by mouth 2 (two) times daily.   ferrous sulfate 325 (65 FE) MG tablet Take 1 tablet (325 mg total) by mouth every other day.   ipratropium (ATROVENT) 0.06 % nasal spray Place 2 sprays into both nostrils 4 (four) times daily. As needed for runny nose / postnasal drip   labetalol (NORMODYNE) 200 MG tablet Take 4 tablets (800 mg total) by mouth 2 (two) times daily.   NIFEdipine (ADALAT CC) 60 MG 24 hr tablet Take 1 tablet (60 mg total) by mouth 2 (two) times  daily.   omeprazole (PRILOSEC) 40 MG capsule Take 1 capsule (40 mg total) by mouth daily.   Prenatal Vit-Fe Fumarate-FA (MULTIVITAMIN-PRENATAL) 27-0.8 MG TABS tablet Take 1 tablet by mouth daily at 12 noon.     Allergies:   Ibuprofen   Social History   Socioeconomic History   Marital status: Married    Spouse name: Selena Meyer   Number of children: 0   Years of education: Not on file   Highest education level: Not on file  Occupational History    Employer: UNEMPLOYED  Tobacco Use   Smoking status: Former    Current packs/day: 0.00    Average packs/day: 0.3 packs/day for 5.0 years (1.3 ttl pk-yrs)    Types: Cigarettes    Start date: 05/16/2016    Quit date: 05/16/2021    Years since quitting: 1.4   Smokeless tobacco: Never   Tobacco comments:    Quit smoking 2023/06/19after her mom passed away  Vaping Use   Vaping  status: Never Used  Substance and Sexual Activity   Alcohol use: Not Currently    Comment: "occasional use; social drinker"   Drug use: No   Sexual activity: Yes    Partners: Male    Birth control/protection: None    Comment: Pregnant  Other Topics Concern   Not on file  Social History Narrative   Not on file   Social Determinants of Health   Financial Resource Strain: Low Risk  (05/08/2022)   Received from [facility], [facility]   Overall Financial Resource Strain (CARDIA)    Difficulty of Paying Living Expenses: Not very hard  Food Insecurity: No Food Insecurity (09/28/2022)   Hunger Vital Sign    Worried About Running Out of Food in the Last Year: Never true    Ran Out of Food in the Last Year: Never true  Transportation Needs: No Transportation Needs (09/28/2022)   PRAPARE - Administrator, Civil Service (Medical): No    Lack of Transportation (Non-Medical): No  Physical Activity: Unknown (11/03/2020)   Received from [facility], [facility]   Exercise Vital Sign    Days of Exercise per Week: 5 days    Minutes of Exercise per Session: Patient declined  Stress: Stress Concern Present (11/03/2020)   Received from [facility], [facility] - Occupational Stress Questionnaire    Feeling of Stress : To some extent  Social Connections: Unknown (05/10/2021)   Received from [facility], [facility]   Social Network    Social Network: Not on file      Family History  Problem Relation Age of Onset   Diabetes Mother    High blood pressure Mother    Renal Disease Mother    Diabetes Sister    Cancer Maternal Grandmother    Diabetes Maternal Grandmother    High blood pressure Maternal Grandmother    Cancer Maternal Aunt        breast   Other Father       ROS:   Please see the history of present illness.     All other systems reviewed and are negative.   Labs/EKG Reviewed:    EKG:   EKG is was  not ordered today.    Recent Labs: 08/03/2022: TSH 1.010 09/30/2022: Hemoglobin 9.1; Magnesium 4.9; Platelets 338 10/02/2022: ALT 22; BUN 12; Creatinine, Ser 1.19; Potassium 3.7; Sodium 136   Recent Lipid Panel Lab Results  Component Value Date/Time  CHOL 240 (H) 11/15/2021 12:00 AM   TRIG 492 (H) 11/15/2021 12:00 AM   HDL 40 (L) 11/15/2021 12:00 AM   CHOLHDL 6.0 (H) 11/15/2021 12:00 AM   LDLCALC  11/15/2021 12:00 AM     Comment:     . LDL cholesterol not calculated. Triglyceride levels greater than 400 mg/dL invalidate calculated LDL results. . Reference range: <100 . Desirable range <100 mg/dL for primary prevention;   <70 mg/dL for patients with CHD or diabetic patients  with > or = 2 CHD risk factors. [name] LDL-C is now calculated using the Martin-Hopkins  calculation, which is a validated novel method providing  better accuracy than the Friedewald equation in the  estimation of LDL-C.  Horald Pollen et al. Lenox Ahr. 7253;664(40): 2061-2068  (http://education.[employer].com/faq/FAQ164)     Physical Exam:    VS:  BP 102/84 (BP Location: Left Arm, Patient Position: Sitting, Cuff Size: Normal)   Pulse 81   Ht 5\' 5"  (1.651 m)   Wt 174 lb 6.4 oz (79.1 kg)   LMP  (LMP Unknown)   SpO2 98%   Breastfeeding Yes   BMI 29.02 kg/m     Wt Readings from Last 3 Encounters:  10/11/22 174 lb 6.4 oz (79.1 kg)  10/06/22 180 lb (81.6 kg)  10/05/22 180 lb (81.6 kg)     GEN:  Well nourished, well developed in no acute distress HEENT: Normal NECK: No JVD; No carotid bruits LYMPHATICS: No lymphadenopathy CARDIAC: RRR, no murmurs, rubs, gallops RESPIRATORY:  Clear to auscultation without rales, wheezing or rhonchi  ABDOMEN: Soft, non-tender, non-distended MUSCULOSKELETAL:  No edema; No deformity  SKIN: Warm and dry NEUROLOGIC:  Alert and oriented x 3 PSYCHIATRIC:  Normal affect    Risk Assessment/Risk Calculators:     CARPREG II Risk Prediction Index Score:  1.  The patient's  risk for a primary cardiac event is 5%.            ASSESSMENT & PLAN:    Hypertension -No symptoms of lightheadedness reported with lower readings. Plan to adjust medications after the 12-week mark. -Continue current medications. -Report if consistently getting systolic readings below 110. -Next appointment on November 4th to reassess and potentially adjust medications.  Postpartum Care Patient is breastfeeding and managing well with the newborn. -Continue routine postpartum care. -Next OB/GYN appointment in November.   Patient Instructions  Medication Instructions:  Your physician recommends that you continue on your current medications as directed. Please refer to the Current Medication list given to you today.  *If you need a refill on your cardiac medications before your next appointment, please call your pharmacy*   Lab Work: None    Testing/Procedures: None   Follow-Up: At [facility], you and your health needs are our priority.  As part of our continuing mission to provide you with exceptional heart care, we have created designated Provider Care Teams.  These Care Teams include your primary Cardiologist (physician) and Advanced Practice Providers (APPs -  Physician Assistants and Nurse Practitioners) who all work together to provide you with the care you need, when you need it.   Your next appointment:   Nov 4th   Provider:   Thomasene Ripple, DO    Dispo:  No follow-ups on file.   Medication Adjustments/Labs and Tests Ordered: Current medicines are reviewed at length with the patient today.  Concerns regarding medicines are outlined above.  Tests Ordered: No orders of the defined types were placed in this encounter.  Medication Changes: No  orders of the defined types were placed in this encounter.

## 2022-10-11 NOTE — Patient Instructions (Signed)
Medication Instructions:  Your physician recommends that you continue on your current medications as directed. Please refer to the Current Medication list given to you today.  *If you need a refill on your cardiac medications before your next appointment, please call your pharmacy*   Lab Work: None    Testing/Procedures: None   Follow-Up: At [facility], you and your health needs are our priority.  As part of our continuing mission to provide you with exceptional heart care, we have created designated Provider Care Teams.  These Care Teams include your primary Cardiologist (physician) and Advanced Practice Providers (APPs -  Physician Assistants and Nurse Practitioners) who all work together to provide you with the care you need, when you need it.   Your next appointment:   Nov 4th   Provider:   Thomasene Ripple, DO

## 2022-10-16 ENCOUNTER — Ambulatory Visit: Payer: BC Managed Care – PPO | Admitting: Cardiology

## 2022-10-17 ENCOUNTER — Telehealth: Payer: Self-pay | Admitting: Cardiology

## 2022-10-17 ENCOUNTER — Telehealth: Payer: Self-pay

## 2022-10-17 ENCOUNTER — Ambulatory Visit: Payer: BC Managed Care – PPO | Admitting: Obstetrics and Gynecology

## 2022-10-17 MED ORDER — ENALAPRIL MALEATE 10 MG PO TABS: 10.0000 mg | ORAL_TABLET | Freq: Two times a day (BID) | ORAL | 1 refills | Status: DC

## 2022-10-17 NOTE — Telephone Encounter (Signed)
Called pt, per Dr. Servando Salina. She is to increase Enalapril 10 mg twice daily. Sent in a refill for pt. She will take her blood pressure in the morning and 1-2 hours after taking medication. No further questions at this time. Pt will reach out if she needs to.

## 2022-10-17 NOTE — Telephone Encounter (Signed)
Pt c/o BP issue: STAT if pt c/o blurred vision, one-sided weakness or slurred speech  1. What are your last 5 BP readings?   This morning 144/96 (patient cuff at nurse home visit) 156/98 (nurse's BP cuff) 118/84  (while on phone)   2. Are you having any other symptoms (ex. Dizziness, headache, blurred vision, passed out)?  No  3. What is your BP issue?    Patient called to report she had a Lactation Consultation Nurse Visit today and her BP was reading high.  Patient wants call back to discuss next steps.

## 2022-10-17 NOTE — Telephone Encounter (Signed)
Patient home visit this morning with BP elevated.  Asymptomatic.  States today was the oly high BP she has had.  Advised patient to keep BP log taking twice daily.  Will call with providers recommendations

## 2022-10-17 NOTE — Telephone Encounter (Signed)
Home health nurse called stating pt had BP reading of 156/98 and Edinburgh score of 12. Nurse states pt did not have a headache, suicidal thoughts or any other symptoms. Nurse also stated pt had taken all of her BP meds. Pt state she retook BP at it was 146/96. Venia Carbon, NP recommends pt call Dr.Tobb's office to evaluate her. Pt expressed understanding and states she will contact Dr.Tobb's office.

## 2022-10-30 ENCOUNTER — Ambulatory Visit: Payer: BC Managed Care – PPO | Admitting: Obstetrics and Gynecology

## 2022-11-10 DIAGNOSIS — Z419 Encounter for procedure for purposes other than remedying health state, unspecified: Secondary | ICD-10-CM | POA: Diagnosis not present

## 2022-11-13 ENCOUNTER — Telehealth: Payer: Self-pay | Admitting: Cardiology

## 2022-11-13 ENCOUNTER — Other Ambulatory Visit: Payer: Self-pay

## 2022-11-13 ENCOUNTER — Emergency Department (HOSPITAL_COMMUNITY)
Admission: EM | Admit: 2022-11-13 | Discharge: 2022-11-13 | Disposition: A | Discharge status: Home / Self Care | Payer: BC Managed Care – PPO | Attending: Emergency Medicine | Admitting: Emergency Medicine

## 2022-11-13 ENCOUNTER — Encounter (HOSPITAL_COMMUNITY): Payer: Self-pay | Admitting: Emergency Medicine

## 2022-11-13 ENCOUNTER — Ambulatory Visit: Payer: BC Managed Care – PPO | Admitting: Cardiology

## 2022-11-13 DIAGNOSIS — O9943 Diseases of the circulatory system complicating the puerperium: Secondary | ICD-10-CM | POA: Diagnosis not present

## 2022-11-13 DIAGNOSIS — O9089 Other complications of the puerperium, not elsewhere classified: Secondary | ICD-10-CM | POA: Diagnosis not present

## 2022-11-13 DIAGNOSIS — E860 Dehydration: Secondary | ICD-10-CM | POA: Diagnosis not present

## 2022-11-13 DIAGNOSIS — R42 Dizziness and giddiness: Secondary | ICD-10-CM | POA: Diagnosis not present

## 2022-11-13 DIAGNOSIS — I951 Orthostatic hypotension: Secondary | ICD-10-CM | POA: Diagnosis not present

## 2022-11-13 DIAGNOSIS — O1003 Pre-existing essential hypertension complicating the puerperium: Secondary | ICD-10-CM | POA: Diagnosis not present

## 2022-11-13 DIAGNOSIS — I959 Hypotension, unspecified: Secondary | ICD-10-CM | POA: Diagnosis not present

## 2022-11-13 DIAGNOSIS — O99285 Endocrine, nutritional and metabolic diseases complicating the puerperium: Secondary | ICD-10-CM | POA: Insufficient documentation

## 2022-11-13 LAB — BASIC METABOLIC PANEL
Anion gap: 9 (ref 5–15)
BUN: 17 mg/dL (ref 6–20)
CO2: 23 mmol/L (ref 22–32)
Calcium: 9 mg/dL (ref 8.9–10.3)
Chloride: 106 mmol/L (ref 98–111)
Creatinine, Ser: 1.41 mg/dL — ABNORMAL HIGH (ref 0.44–1.00)
GFR, Estimated: 49 mL/min — ABNORMAL LOW (ref >60)
Glucose, Bld: 94 mg/dL (ref 70–99)
Potassium: 3.9 mmol/L (ref 3.5–5.1)
Sodium: 138 mmol/L (ref 135–145)

## 2022-11-13 LAB — CBC WITH DIFFERENTIAL/PLATELET
Abs Immature Granulocytes: 0.04 10*3/uL (ref 0.00–0.07)
Basophils Absolute: 0.1 10*3/uL (ref 0.0–0.1)
Basophils Relative: 1 %
Eosinophils Absolute: 0.1 10*3/uL (ref 0.0–0.5)
Eosinophils Relative: 1 %
HCT: 38 % (ref 36.0–46.0)
Hemoglobin: 12.5 g/dL (ref 12.0–15.0)
Immature Granulocytes: 0 %
Lymphocytes Relative: 18 %
Lymphs Abs: 1.6 10*3/uL (ref 0.7–4.0)
MCH: 29.6 pg (ref 26.0–34.0)
MCHC: 32.9 g/dL (ref 30.0–36.0)
MCV: 89.8 fL (ref 80.0–100.0)
Monocytes Absolute: 0.7 10*3/uL (ref 0.1–1.0)
Monocytes Relative: 8 %
Neutro Abs: 6.8 10*3/uL (ref 1.7–7.7)
Neutrophils Relative %: 72 %
Platelets: 369 10*3/uL (ref 150–400)
RBC: 4.23 MIL/uL (ref 3.87–5.11)
RDW: 13.1 % (ref 11.5–15.5)
WBC: 9.4 10*3/uL (ref 4.0–10.5)
nRBC: 0 % (ref 0.0–0.2)

## 2022-11-13 LAB — CBG MONITORING, ED: Glucose-Capillary: 89 mg/dL (ref 70–99)

## 2022-11-13 MED ORDER — SODIUM CHLORIDE 0.9 % IV BOLUS
1000.0000 mL | Freq: Once | INTRAVENOUS | Status: AC
  Administered 2022-11-13: 1000 mL via INTRAVENOUS

## 2022-11-13 NOTE — Telephone Encounter (Signed)
Called patient and she had called EMS. Will call back as needed. BP 83/55.

## 2022-11-13 NOTE — Telephone Encounter (Signed)
Pt c/o BP issue: STAT if pt c/o blurred vision, one-sided weakness or slurred speech  1. What are your last 5 BP readings? 93/61  2. Are you having any other symptoms (ex. Dizziness, headache, blurred vision, passed out)? Dizziness  3. What is your BP issue? Pt would like to know if she needs to come to appointment or go to the hospital. Please advise

## 2022-11-13 NOTE — ED Provider Notes (Signed)
[place] EMERGENCY DEPARTMENT AT [facility] Provider Note   CSN: [id number] Arrival date & time: 11/13/22  1433     History  Chief Complaint  Patient presents with   Dizziness    Selena Meyer is a 37 y.o. female with past medical history significant for SVT, hypertension, hyperlipidemia, anemia, gestational diabetes presents to the ED via EMS from home complaining of dizziness that began around noon today.  Patient is 6 weeks postpartum.  She took her blood pressure at home and found that the SBP was in the 80s.  EMS also reports she had positive orthostatics.  Two weeks ago, patient had an increase in her enalapril from 5 mg to 10 mg.  She is followed by cardiology.  She states when a nurse came out to check her BP and reported it elevated, but she had not yet taken her morning medication.  Patient describes her dizziness as both "room spinning" and feeling like she may pass out upon standing.  She states she has not been eating or drinking "as much as she should".  Denies chest pain, shortness of breath, palpitations, nausea, vomiting, tinnitus, otalgia, syncope, weakness, numbness, visual disturbance, headache.         Home Medications Prior to Admission medications   Medication Sig Start Date End Date Taking? Authorizing Provider  acetaminophen (TYLENOL) 500 MG tablet Take 500 mg by mouth as needed.    [provider]  albuterol (VENTOLIN HFA) 108 (90 Base) MCG/ACT inhaler Inhale 2 puffs into the lungs every 6 (six) hours as needed for wheezing or shortness of breath. 04/20/21   [name], NP  enalapril (VASOTEC) 10 MG tablet Take 1 tablet (10 mg total) by mouth 2 (two) times daily. 10/17/22   Tobb, Kardie, DO  ferrous sulfate 325 (65 FE) MG tablet Take 1 tablet (325 mg total) by mouth every other day. 10/02/22   Reva Bores, MD  furosemide (LASIX) 20 MG tablet Take 1 tablet (20 mg total) by mouth daily. Patient not taking: Reported on 10/11/2022 10/02/22    Reva Bores, MD  ipratropium (ATROVENT) 0.06 % nasal spray Place 2 sprays into both nostrils 4 (four) times daily. As needed for runny nose / postnasal drip 05/26/20   Sunnie Nielsen, DO  labetalol (NORMODYNE) 200 MG tablet Take 4 tablets (800 mg total) by mouth 2 (two) times daily. 10/02/22   Reva Bores, MD  MAGNESIUM-OXIDE 400 (240 Mg) MG tablet Take 0.5 tablets by mouth daily. Patient not taking: Reported on 10/11/2022 08/10/22   [provider]  NIFEdipine (ADALAT CC) 60 MG 24 hr tablet Take 1 tablet (60 mg total) by mouth 2 (two) times daily. 10/02/22   Reva Bores, MD  omeprazole (PRILOSEC) 40 MG capsule Take 1 capsule (40 mg total) by mouth daily. 09/27/22   Joanne Gavel, MD  Prenatal Vit-Fe Fumarate-FA (MULTIVITAMIN-PRENATAL) 27-0.8 MG TABS tablet Take 1 tablet by mouth daily at 12 noon.    [provider]      Allergies    Ibuprofen    Review of Systems   Review of Systems  HENT:  Negative for ear pain and tinnitus.   Eyes:  Negative for visual disturbance.  Respiratory:  Negative for shortness of breath.   Cardiovascular:  Negative for chest pain.  Gastrointestinal:  Negative for nausea and vomiting.  Neurological:  Positive for dizziness and light-headedness. Negative for syncope, weakness and headaches.    Physical Exam Updated Vital Signs  BP (!) 140/88   Pulse (!) 53   Temp 98 F (36.7 C) (Oral)   Resp 16   LMP 01/18/2022   SpO2 96%   Breastfeeding Yes  Physical Exam Vitals and nursing note reviewed.  Constitutional:      General: She is not in acute distress.    Appearance: Normal appearance. She is not ill-appearing or diaphoretic.  HENT:     Head: Normocephalic and atraumatic.     Right Ear: Tympanic membrane and ear canal normal.     Left Ear: Tympanic membrane and ear canal normal.     Mouth/Throat:     Lips: Pink.     Mouth: Mucous membranes are moist.  Eyes:     General: Lids are normal. Vision grossly intact.      Extraocular Movements: Extraocular movements intact.     Conjunctiva/sclera: Conjunctivae normal.     Pupils: Pupils are equal, round, and reactive to light.     Comments: No visible nystagmus at rest or with EOM.  Cardiovascular:     Rate and Rhythm: Normal rate and regular rhythm.     Heart sounds: Normal heart sounds.  Pulmonary:     Effort: Pulmonary effort is normal. No tachypnea or respiratory distress.     Breath sounds: Normal breath sounds and air entry.  Musculoskeletal:     Cervical back: Full passive range of motion without pain.  Skin:    General: Skin is warm and dry.     Capillary Refill: Capillary refill takes less than 2 seconds.  Neurological:     Mental Status: She is alert. Mental status is at baseline.     Cranial Nerves: Cranial nerves 2-12 are intact. No cranial nerve deficit or dysarthria.     Motor: No weakness, tremor or pronator drift.     Coordination: Coordination is intact.     Comments: Cranial Nerves:  II: peripheral fields grossly intact III,IV, VI: ptosis not present, extra-ocular movements intact bilaterally, direct and consensual pupillary light reflexes intact bilaterally V: facial sensation, jaw opening, and bite strength equal bilaterally VII: eyebrow raise, eyelid close, smile, frown, pucker equal bilaterally VIII: hearing grossly normal bilaterally  IX,X: palate elevation and swallowing intact XI: bilateral shoulder shrug and lateral head rotation equal and strong XII: midline tongue extension Motor: 5/5 strength RUE, RLE, LUE, LLE  Psychiatric:        Mood and Affect: Mood normal.        Behavior: Behavior normal.     ED Results / Procedures / Treatments   Labs (all labs ordered are listed, but only abnormal results are displayed) Labs Reviewed  BASIC METABOLIC PANEL - Abnormal; Notable for the following components:      Result Value   Creatinine, Ser 1.41 (*)    GFR, Estimated 49 (*)    All other components within normal limits   CBC WITH DIFFERENTIAL/PLATELET  CBG MONITORING, ED    EKG None  Radiology No results found.  Procedures Procedures    Medications Ordered in ED Medications  sodium chloride 0.9 % bolus 1,000 mL (1,000 mLs Intravenous New Bag/Given 11/13/22 1809)    ED Course/ Medical Decision Making/ A&P                                 Medical Decision Making Amount and/or Complexity of Data Reviewed Labs: ordered.   This patient presents to the ED with chief complaint(s)  of dizziness with pertinent past medical history of hypertension, SVT, postpartum.  The complaint involves an extensive differential diagnosis and also carries with it a high risk of complications and morbidity.    The differential diagnosis includes orthostatic hypotension, metabolic derangement, cardiac dysrhythmia   The initial plan is to obtain ECG, labs  Additional history obtained: Additional history obtained from EMS , +orthostatics with EMS, they gave 500 cc fluid bolus.  Patient reports feeling improved after this. Records reviewed  cardiology notes from 10/2022  Initial Assessment:   Exam significant for overall well-appearing patient who is not in acute distress.  Neuro exam is overall reassuring.  No cranial nerve deficits.  5/5 strength in all extremities.  EOM intact, no nystagmus.  Vision is grossly intact.  She is alert, oriented, and answering all questions appropriately.  Vitals are stable.    Independent ECG/labs interpretation:  The following labs were independently interpreted:  CBC without leukocytosis or anemia.  CBG normal.  Metabolic panel significant for elevated creatinine with reduced GFR.  No electrolyte disturbance.  Suspect there may be element of dehydration.  Treatment and Reassessment: Will give patient oral hydration and continue to monitor BP. Orthostatics were obtained and were positive.  Patient given 1L NS bolus.  Patient was able to ambulate to the bathroom without difficulty  and did not report dizziness.  She reports her symptoms have improved.  Disposition:   Suspect patient's symptoms may be related to increase in blood pressure medication as well as orthostatic hypotension/fluid depletion.  Advised patient to increase her fluid intake over the next several days to help rehydrate.  Advised patient to reduce dose back down to previous 5 mg enalapril twice daily, contact her cardiologist tomorrow, and to monitor her blood pressure closely.    The patient has been appropriately medically screened and/or stabilized in the ED. I have low suspicion for any other emergent medical condition which would require further screening, evaluation or treatment in the ED or require inpatient management. At time of discharge the patient is hemodynamically stable and in no acute distress. I have discussed work-up results and diagnosis with patient and answered all questions. Patient is agreeable with discharge plan. We discussed strict return precautions for returning to the emergency department and they verbalized understanding.             Final Clinical Impression(s) / ED Diagnoses Final diagnoses:  Orthostatic hypotension  Dizziness  Dehydration    Rx / DC Orders ED Discharge Orders     None         Lenard Simmer, PA-C 11/13/22 2005    Gloris Manchester, MD 11/15/22 [id number]

## 2022-11-13 NOTE — ED Triage Notes (Signed)
Per [facility] pt coming from home- pt is 6 weeks post partum. Began having dizziness around noon today and was in the 80s. Ems report + orthostatics. 2 weeks ago had a medication change. Given 500 CC in 20G RAC.

## 2022-11-13 NOTE — Discharge Instructions (Signed)
Thank you for allowing [place] to be a part of your care today.  You were evaluated in the ED for hypotension and dizziness.    You did have evidence of dehydration.  Increase your hydration over the next several days to try and stay well hydrated.  This is also very important if you are breastfeeding.   I recommend reducing your enalapril back down to your 5 mg twice daily dose.  Monitor your blood pressure closely at home and contact your cardiologist tomorrow.  Take all of your other medications as prescribed.    Return to the ED if develop sudden worsening of your symptoms or if you have new concerns.

## 2022-11-15 ENCOUNTER — Other Ambulatory Visit (HOSPITAL_COMMUNITY)
Admission: RE | Admit: 2022-11-15 | Discharge: 2022-11-15 | Disposition: A | Discharge status: Home / Self Care | Payer: BC Managed Care – PPO | Source: Ambulatory Visit | Attending: Obstetrics and Gynecology | Admitting: Obstetrics and Gynecology

## 2022-11-15 ENCOUNTER — Ambulatory Visit: Payer: BC Managed Care – PPO | Admitting: Obstetrics and Gynecology

## 2022-11-15 ENCOUNTER — Encounter: Payer: Self-pay | Admitting: Obstetrics and Gynecology

## 2022-11-15 ENCOUNTER — Encounter: Payer: Self-pay | Admitting: Family

## 2022-11-15 VITALS — BP 139/78 | HR 67 | Resp 16 | Ht 65.0 in | Wt 174.0 lb

## 2022-11-15 DIAGNOSIS — Z8632 Personal history of gestational diabetes: Secondary | ICD-10-CM | POA: Diagnosis not present

## 2022-11-15 DIAGNOSIS — N898 Other specified noninflammatory disorders of vagina: Secondary | ICD-10-CM | POA: Insufficient documentation

## 2022-11-15 DIAGNOSIS — I10 Essential (primary) hypertension: Secondary | ICD-10-CM

## 2022-11-15 DIAGNOSIS — B9689 Other specified bacterial agents as the cause of diseases classified elsewhere: Secondary | ICD-10-CM

## 2022-11-15 DIAGNOSIS — R399 Unspecified symptoms and signs involving the genitourinary system: Secondary | ICD-10-CM

## 2022-11-15 DIAGNOSIS — A599 Trichomoniasis, unspecified: Secondary | ICD-10-CM

## 2022-11-15 DIAGNOSIS — N760 Acute vaginitis: Secondary | ICD-10-CM

## 2022-11-15 MED ORDER — ENALAPRIL MALEATE 5 MG PO TABS: 5.0000 mg | ORAL_TABLET | Freq: Two times a day (BID) | ORAL | 3 refills | Status: DC

## 2022-11-15 NOTE — Progress Notes (Signed)
Post Partum Visit Note  Selena Meyer is a 37 y.o. G72P0101 female who presents for a postpartum visit. She is 6 weeks postpartum following a normal spontaneous vaginal delivery.  I have fully reviewed the prenatal and intrapartum course. The delivery was at 36.2 gestational weeks.  Anesthesia: epidural. Postpartum course has been unremarkable. Baby is doing well. Baby is feeding by breast. Bleeding no bleeding. Bowel function is normal. Bladder function is normal. Patient is not sexually active. Contraception method is none. Postpartum depression screening: negative.  Notes yellow vaginal discharge. Denies odor. Notes cloudiness to urine.   The pregnancy intention screening data noted above was reviewed. Potential methods of contraception were discussed. The patient elected to proceed with No data recorded.   Edinburgh Postnatal Depression Scale - 11/15/22 0839       Edinburgh Postnatal Depression Scale:  In the Past 7 Days   I have been able to laugh and see the funny side of things. 1    I have looked forward with enjoyment to things. 1    I have blamed myself unnecessarily when things went wrong. 2    I have been anxious or worried for no good reason. 2    I have felt scared or panicky for no good reason. 0    Things have been getting on top of me. 1    I have been so unhappy that I have had difficulty sleeping. 0    I have felt sad or miserable. 2    I have been so unhappy that I have been crying. 0    The thought of harming myself has occurred to me. 0    Edinburgh Postnatal Depression Scale Total 9             Health Maintenance Due  Topic Date Due   INFLUENZA VACCINE  Never done    The following portions of the patient's history were reviewed and updated as appropriate: allergies, current medications, past family history, past medical history, past social history, past surgical history, and problem list.  Review of Systems Pertinent items are noted in  HPI.  Objective:  BP (!) 147/85   Pulse 74   Resp 16   Ht 5\' 5"  (1.651 m)   Wt 174 lb (78.9 kg)   LMP 01/18/2022   Breastfeeding Yes   BMI 28.96 kg/m    General:  alert, cooperative, and no distress   Breasts:  not indicated  GU exam:  not indicated       Assessment:   Vaginal discharge -     Cervicovaginal ancillary only( [place])  Urinary symptom or sign -     Urine Culture  History of diet controlled gestational diabetes mellitus (GDM) - Unable to do 2 hr today - Ate with medicine. Will retry later.   Chronic hypertension - Continue follow up with Dr. Servando Salina. Reviewed recent ED record. New Rx sent for enalapril. Has not taken any since that visit but lightheadedness resolved.  -     enalapril (VASOTEC) 5 MG tablet; Take 1 tablet (5 mg total) by mouth 2 (two) times daily.     Plan:   Essential components of care per ACOG recommendations:  1.  Mood and well being: Patient with negative depression screening today - prior elevation largely due to baby note sleeping more than 1-2 hours at a time. Reviewed local resources for support.  - Patient tobacco use? No.   - hx of drug use? No.  2. Infant care and feeding:  -Patient currently breastmilk feeding? No.  -Social determinants of health (SDOH) reviewed in EPIC. No concerns  3. Sexuality, contraception and birth spacing - Patient does not want a pregnancy in the next year.  Desired family size is 1 children.  - Reviewed reproductive life planning. Reviewed contraceptive methods based on pt preferences and effectiveness.  Patient unsure today. She will consider Paragard.  - Discussed birth spacing of 18 months  4. Sleep and fatigue -Encouraged family/partner/community support of 4 hrs of uninterrupted sleep to help with mood and fatigue  5. Physical Recovery  - Discussed patients delivery and complications. She describes her labor as good. - Patient had a Vaginal, no problems at delivery. Patient had a 1st  degree laceration. Perineal healing reviewed. Patient expressed understanding - Patient has urinary incontinence? No. - Patient is safe to resume physical and sexual activity  6.  Health Maintenance - HM due items addressed Yes - Last pap smear  Diagnosis  Date Value Ref Range Status  04/03/2022   Final   - Negative for intraepithelial lesion or malignancy (NILM)   Pap smear not done at today's visit.  -Breast Cancer screening indicated? No.   7. Chronic Disease/Pregnancy Condition follow up: Hypertension - PCP follow up  Milas Hock, MD [facility], [facility]

## 2022-11-16 ENCOUNTER — Encounter: Payer: Self-pay | Admitting: Obstetrics and Gynecology

## 2022-11-16 LAB — CERVICOVAGINAL ANCILLARY ONLY
Bacterial Vaginitis (gardnerella): POSITIVE — AB
Candida Glabrata: NEGATIVE
Candida Vaginitis: NEGATIVE
Comment: NEGATIVE
Comment: NEGATIVE
Comment: NEGATIVE
Comment: NEGATIVE
Trichomonas: POSITIVE — AB

## 2022-11-16 MED ORDER — METRONIDAZOLE 500 MG PO TABS: 500.0000 mg | ORAL_TABLET | Freq: Two times a day (BID) | ORAL | 1 refills | Status: AC

## 2022-11-16 NOTE — Addendum Note (Signed)
Addended by: Milas Hock A on: 11/16/2022 01:51 PM   Modules accepted: Orders

## 2022-11-17 ENCOUNTER — Telehealth: Payer: Self-pay

## 2022-11-17 LAB — URINE CULTURE

## 2022-11-17 LAB — SPECIMEN STATUS REPORT

## 2022-11-17 NOTE — Telephone Encounter (Signed)
Called pt check in. No answer, left message for her to return the call.

## 2022-11-20 ENCOUNTER — Ambulatory Visit: Payer: BC Managed Care – PPO | Attending: Cardiology | Admitting: Cardiology

## 2022-11-20 ENCOUNTER — Encounter: Payer: Self-pay | Admitting: Cardiology

## 2022-11-20 VITALS — BP 118/68 | HR 78 | Ht 65.0 in | Wt 175.0 lb

## 2022-11-20 DIAGNOSIS — I10 Essential (primary) hypertension: Secondary | ICD-10-CM

## 2022-11-20 DIAGNOSIS — I471 Supraventricular tachycardia, unspecified: Secondary | ICD-10-CM | POA: Diagnosis not present

## 2022-11-20 MED ORDER — LABETALOL HCL 200 MG PO TABS: 400.0000 mg | ORAL_TABLET | Freq: Two times a day (BID) | ORAL | 0 refills | Status: DC

## 2022-11-20 NOTE — Patient Instructions (Signed)
Medication Instructions:  Your physician has recommended you make the following change in your medication:  DECREASE: Labetalol 400 mg twice daily *If you need a refill on your cardiac medications before your next appointment, please call your pharmacy*   Lab Work: CMET, Mag If you have labs (blood work) drawn today and your tests are completely normal, you will receive your results only by: MyChart Message (if you have MyChart) OR A paper copy in the mail If you have any lab test that is abnormal or we need to change your treatment, we will call you to review the results.   Follow-Up: At [facility], you and your health needs are our priority.  As part of our continuing mission to provide you with exceptional heart care, we have created designated Provider Care Teams.  These Care Teams include your primary Cardiologist (physician) and Advanced Practice Providers (APPs -  Physician Assistants and Nurse Practitioners) who all work together to provide you with the care you need, when you need it.  We recommend signing up for the patient portal called "MyChart".  Sign up information is provided on this After Visit Summary.  MyChart is used to connect with patients for Virtual Visits (Telemedicine).  Patients are able to view lab/test results, encounter notes, upcoming appointments, etc.  Non-urgent messages can be sent to your provider as well.   To learn more about what you can do with MyChart, go to [email or web address].    Your next appointment:   2 week(s)  Provider:   Thomasene Ripple, DO     Other Instructions Please take your blood pressure daily for 1 weeks and send in a MyChart message. Please include heart rates. (Send one message, not daily messages).   HOW TO TAKE YOUR BLOOD PRESSURE: Rest 5 minutes before taking your blood pressure. Don't smoke or drink caffeinated beverages for at least 30 minutes before. Take your blood pressure before (not after) you eat. Sit  comfortably with your back supported and both feet on the floor (don't cross your legs). Elevate your arm to heart level on a table or a desk. Use the proper sized cuff. It should fit smoothly and snugly around your bare upper arm. There should be enough room to slip a fingertip under the cuff. The bottom edge of the cuff should be 1 inch above the crease of the elbow. Ideally, take 3 measurements at one sitting and record the average.

## 2022-11-20 NOTE — Progress Notes (Signed)
Cardio-Obstetrics Clinic  Follow Up Note   Date:  11/20/2022   ID:  Selena Meyer, DOB Jan 08, 1986, MRN [id number]  PCP:  [name], NP   [facility] Providers Cardiologist:  Thomasene Ripple, DO  Electrophysiologist:  None        Referring MD: [name], NP   Chief Complaint: " I am doing well"  History of Present Illness:    Selena Meyer is a 37 y.o. female [G1P0101] who returns for follow up for postpartum cardiovascular care.    Medical includes SVT, Hypertension, hypertriglyceridemia, FSGS, gestational diabetes.   I did see the patient October 11, 2022, at that time no adjustments were made to her medications.  Since her visit with me she has had a visit to the [facility] where she was hypotensive medications were adjusted.  She is doing well from a cardiovascular standpoint.  Blood pressure has been stable.   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    Gestational diabetes    Hypertension    Hypokalemia 11/02/2020   Irregular menstrual cycle    Irregular periods/menstrual cycles 05/03/2017   Will hold off on Provera or other fertility stuff for now, pt advised pregnancy is not recommended until BP under better control at least, plus other workup pen   Polycystic ovaries 11/09/2015   Proteinuria 02/28/2013   Stage 2 chronic kidney disease 04/27/2020   SVT (supraventricular tachycardia) (HCC) 11/01/2020   Systolic murmur 07/16/2017   Tobacco abuse 02/28/2013   Uncontrolled hypertension 05/10/2017    Past Surgical History:  Procedure Laterality Date   NO PAST SURGERIES        OB History      Gravida  1   Para  1   Term      Preterm  1   AB      Living  1      SAB      IAB      Ectopic      Multiple  0   Live Births  1               Current Medications: Current Meds  Medication Sig   acetaminophen (TYLENOL) 500 MG tablet Take 500 mg by mouth as needed.   albuterol (VENTOLIN HFA) 108 (90 Base) MCG/ACT inhaler Inhale 2 puffs into the lungs every 6 (six) hours as needed for wheezing or shortness of breath.   enalapril (VASOTEC) 5 MG tablet Take 1 tablet (5 mg total) by mouth 2 (two) times daily.   ferrous sulfate 325 (65 FE) MG tablet Take 1 tablet (325 mg total) by mouth every other day.   ipratropium (ATROVENT) 0.06 % nasal spray Place 2 sprays into both nostrils 4 (four) times daily. As needed for runny nose / postnasal drip   labetalol (NORMODYNE) 200 MG tablet Take 2 tablets (400 mg total) by mouth 2 (two) times daily.   metroNIDAZOLE (FLAGYL) 500 MG tablet Take 1 tablet (500 mg total) by mouth 2 (two) times daily for 7 days.   NIFEdipine (ADALAT CC) 60 MG 24 hr tablet Take 1 tablet (60 mg total) by mouth 2 (two) times daily.  Prenatal Vit-Fe Fumarate-FA (MULTIVITAMIN-PRENATAL) 27-0.8 MG TABS tablet Take 1 tablet by mouth daily at 12 noon.   [DISCONTINUED] labetalol (NORMODYNE) 200 MG tablet Take 4 tablets (800 mg total) by mouth 2 (two) times daily.     Allergies:   Ibuprofen   Social History   Socioeconomic History   Marital status: Married    Spouse name: Ivar Drape   Number of children: 0   Years of education: Not on file   Highest education level: Not on file  Occupational History    Employer: UNEMPLOYED  Tobacco Use   Smoking status: Former    Current packs/day: 0.00    Average packs/day: 0.3 packs/day for 5.0 years (1.3 ttl pk-yrs)    Types: Cigarettes    Start date: 05/16/2016    Quit date: 05/16/2021    Years since quitting: 1.5   Smokeless tobacco: Never   Tobacco comments:    Quit smoking 05/30/2023after her mom passed away   Vaping Use   Vaping status: Never Used  Substance and Sexual Activity   Alcohol use: Not Currently    Comment: "occasional use; social drinker"   Drug use: No   Sexual activity: Yes    Partners: Male    Birth control/protection: None    Comment: Pregnant  Other Topics Concern   Not on file  Social History Narrative   Not on file   Social Determinants of Health   Financial Resource Strain: Low Risk  (05/08/2022)   Received from [facility], [facility]   Overall Financial Resource Strain (CARDIA)    Difficulty of Paying Living Expenses: Not very hard  Food Insecurity: No Food Insecurity (09/28/2022)   Hunger Vital Sign    Worried About Running Out of Food in the Last Year: Never true    Ran Out of Food in the Last Year: Never true  Transportation Needs: No Transportation Needs (09/28/2022)   PRAPARE - Administrator, Civil Service (Medical): No    Lack of Transportation (Non-Medical): No  Physical Activity: Unknown (11/03/2020)   Received from [facility], [facility]   Exercise Vital Sign    Days of Exercise per Week: 5 days    Minutes of Exercise per Session: Patient declined  Stress: Stress Concern Present (11/03/2020)   Received from [facility], [facility] Occupational Health - Occupational Stress Questionnaire    Feeling of Stress : To some extent  Social Connections: Unknown (05/10/2021)   Received from [facility], [facility]   Social Network    Social Network: Not on file      Family History  Problem Relation Age of Onset   Diabetes Mother    High blood pressure Mother    Renal Disease Mother    Diabetes Sister    Cancer Maternal Grandmother    Diabetes Maternal Grandmother    High blood pressure Maternal Grandmother    Cancer Maternal Aunt        breast   Other Father       ROS:   Please see the history of present illness.     All other systems reviewed and are negative.   Labs/EKG Reviewed:     EKG:   EKG is was not ordered today.    Recent Labs: 08/03/2022: TSH 1.010 09/30/2022: Magnesium 4.9 10/02/2022: ALT 22 11/13/2022: BUN 17; Creatinine, Ser 1.41; Hemoglobin 12.5; Platelets 369; Potassium 3.9; Sodium 138   Recent Lipid Panel Lab Results  Component  Value Date/Time   CHOL 240 (H) 11/15/2021 12:00 AM   TRIG 492 (H) 11/15/2021 12:00 AM   HDL 40 (L) 11/15/2021 12:00 AM   CHOLHDL 6.0 (H) 11/15/2021 12:00 AM   LDLCALC  11/15/2021 12:00 AM     Comment:     . LDL cholesterol not calculated. Triglyceride levels greater than 400 mg/dL invalidate calculated LDL results. . Reference range: <100 . Desirable range <100 mg/dL for primary prevention;   <70 mg/dL for patients with CHD or diabetic patients  with > or = 2 CHD risk factors. [name] LDL-C is now calculated using the Martin-Hopkins  calculation, which is a validated novel method providing  better accuracy than the Friedewald equation in the  estimation of LDL-C.  Horald Pollen et al. Lenox Ahr. 4098;119(14): 2061-2068  (http://education.[facility].com/faq/FAQ164)     Physical Exam:    VS:  BP 118/68   Pulse 78   Ht 5\' 5"  (1.651 m)   Wt 175 lb (79.4 kg)   SpO2 99%   BMI 29.12 kg/m     Wt Readings from Last 3 Encounters:  11/20/22 175 lb (79.4 kg)  11/15/22 174 lb (78.9 kg)  10/11/22 174 lb 6.4 oz (79.1 kg)     GEN:  Well nourished, well developed in no acute distress HEENT: Normal NECK: No JVD; No carotid bruits LYMPHATICS: No lymphadenopathy CARDIAC: RRR, no murmurs, rubs, gallops RESPIRATORY:  Clear to auscultation without rales, wheezing or rhonchi  ABDOMEN: Soft, non-tender, non-distended MUSCULOSKELETAL:  No edema; No deformity  SKIN: Warm and dry NEUROLOGIC:  Alert and oriented x 3 PSYCHIATRIC:  Normal affect    Risk Assessment/Risk Calculators:                  ASSESSMENT & PLAN:    Chronic hypertension Postpartum care  Her blood pressure medications were adjusted she is now  enalapril 5 mg twice a day, she is on nifedipine 60 mg twice daily will keep this, will cut back on her labetalol to 400 mg twice a day.  In the meantime of asked the patient to take her blood pressure daily and send me information in 1 week.  If she maintains her blood pressure less than 130/80 we will keep this regimen if not we will go back to 800 mg twice a day.  I also encouraged the patient to increase her caloric and fluid intake given the fact that she is breast-feeding.  She needs a close follow-up.  Will see the patient in 2 weeks.   Patient Instructions  Medication Instructions:  Your physician has recommended you make the following change in your medication:  DECREASE: Labetalol 400 mg twice daily *If you need a refill on your cardiac medications before your next appointment, please call your pharmacy*   Lab Work: CMET, Mag If you have labs (blood work) drawn today and your tests are completely normal, you will receive your results only by: MyChart Message (if you have MyChart) OR A paper copy in the mail If you have any lab test that is abnormal or we need to change your treatment, we will call you to review the results.   Follow-Up: At [facility], you and your health needs are our priority.  As part of our continuing mission to provide you with exceptional heart care, we have created designated Provider Care Teams.  These Care Teams include your primary Cardiologist (physician) and Advanced Practice Providers (APPs -  Physician Assistants and Nurse Practitioners) who all work together to  provide you with the care you need, when you need it.  We recommend signing up for the patient portal called "MyChart".  Sign up information is provided on this After Visit Summary.  MyChart is used to connect with patients for Virtual Visits (Telemedicine).  Patients are able to view lab/test results, encounter notes, upcoming appointments, etc.  Non-urgent messages can be sent to  your provider as well.   To learn more about what you can do with MyChart, go to [email or web address].    Your next appointment:   2 week(s)  Provider:   Thomasene Ripple, DO     Other Instructions Please take your blood pressure daily for 1 weeks and send in a MyChart message. Please include heart rates. (Send one message, not daily messages).   HOW TO TAKE YOUR BLOOD PRESSURE: Rest 5 minutes before taking your blood pressure. Don't smoke or drink caffeinated beverages for at least 30 minutes before. Take your blood pressure before (not after) you eat. Sit comfortably with your back supported and both feet on the floor (don't cross your legs). Elevate your arm to heart level on a table or a desk. Use the proper sized cuff. It should fit smoothly and snugly around your bare upper arm. There should be enough room to slip a fingertip under the cuff. The bottom edge of the cuff should be 1 inch above the crease of the elbow. Ideally, take 3 measurements at one sitting and record the average.     Dispo:  No follow-ups on file.   Medication Adjustments/Labs and Tests Ordered: Current medicines are reviewed at length with the patient today.  Concerns regarding medicines are outlined above.  Tests Ordered: Orders Placed This Encounter  Procedures   Comprehensive Metabolic Panel (CMET)   Magnesium   Medication Changes: Meds ordered this encounter  Medications   labetalol (NORMODYNE) 200 MG tablet    Sig: Take 2 tablets (400 mg total) by mouth 2 (two) times daily.    Dispense:  90 tablet    Refill:  0

## 2022-11-21 LAB — COMPREHENSIVE METABOLIC PANEL
ALT: 23 [IU]/L (ref 0–32)
AST: 24 [IU]/L (ref 0–40)
Albumin: 4.1 g/dL (ref 3.9–4.9)
Alkaline Phosphatase: 96 [IU]/L (ref 44–121)
BUN/Creatinine Ratio: 13 (ref 9–23)
BUN: 16 mg/dL (ref 6–20)
Bilirubin Total: <0.2 mg/dL (ref 0.0–1.2)
CO2: 20 mmol/L (ref 20–29)
Calcium: 9.7 mg/dL (ref 8.7–10.2)
Chloride: 104 mmol/L (ref 96–106)
Creatinine, Ser: 1.2 mg/dL — ABNORMAL HIGH (ref 0.57–1.00)
Globulin, Total: 2.6 g/dL (ref 1.5–4.5)
Glucose: 156 mg/dL — ABNORMAL HIGH (ref 70–99)
Potassium: 4.1 mmol/L (ref 3.5–5.2)
Sodium: 142 mmol/L (ref 134–144)
Total Protein: 6.7 g/dL (ref 6.0–8.5)
eGFR: 59 mL/min/{1.73_m2} — ABNORMAL LOW (ref >59)

## 2022-11-21 LAB — MAGNESIUM: Magnesium: 1.8 mg/dL (ref 1.6–2.3)

## 2022-11-23 ENCOUNTER — Inpatient Hospital Stay: Payer: BC Managed Care – PPO | Attending: Hematology & Oncology

## 2022-11-23 ENCOUNTER — Encounter: Payer: Self-pay | Admitting: Medical Oncology

## 2022-11-23 ENCOUNTER — Inpatient Hospital Stay (HOSPITAL_BASED_OUTPATIENT_CLINIC_OR_DEPARTMENT_OTHER): Payer: BC Managed Care – PPO | Admitting: Medical Oncology

## 2022-11-23 VITALS — BP 129/78 | HR 90 | Temp 98.8°F | Resp 17 | Wt 175.1 lb

## 2022-11-23 DIAGNOSIS — D500 Iron deficiency anemia secondary to blood loss (chronic): Secondary | ICD-10-CM

## 2022-11-23 DIAGNOSIS — N920 Excessive and frequent menstruation with regular cycle: Secondary | ICD-10-CM | POA: Insufficient documentation

## 2022-11-23 LAB — CBC WITH DIFFERENTIAL (CANCER CENTER ONLY)
Abs Immature Granulocytes: 0.03 10*3/uL (ref 0.00–0.07)
Basophils Absolute: 0.1 10*3/uL (ref 0.0–0.1)
Basophils Relative: 1 %
Eosinophils Absolute: 0.1 10*3/uL (ref 0.0–0.5)
Eosinophils Relative: 1 %
HCT: 37.5 % (ref 36.0–46.0)
Hemoglobin: 12.8 g/dL (ref 12.0–15.0)
Immature Granulocytes: 0 %
Lymphocytes Relative: 26 %
Lymphs Abs: 2.3 10*3/uL (ref 0.7–4.0)
MCH: 29.5 pg (ref 26.0–34.0)
MCHC: 34.1 g/dL (ref 30.0–36.0)
MCV: 86.4 fL (ref 80.0–100.0)
Monocytes Absolute: 0.5 10*3/uL (ref 0.1–1.0)
Monocytes Relative: 6 %
Neutro Abs: 5.9 10*3/uL (ref 1.7–7.7)
Neutrophils Relative %: 66 %
Platelet Count: 405 10*3/uL — ABNORMAL HIGH (ref 150–400)
RBC: 4.34 MIL/uL (ref 3.87–5.11)
RDW: 13.2 % (ref 11.5–15.5)
WBC Count: 8.9 10*3/uL (ref 4.0–10.5)
nRBC: 0 % (ref 0.0–0.2)

## 2022-11-23 LAB — RETICULOCYTES
Immature Retic Fract: 13.5 % (ref 2.3–15.9)
RBC.: 4.33 MIL/uL (ref 3.87–5.11)
Retic Count, Absolute: 56.7 10*3/uL (ref 19.0–186.0)
Retic Ct Pct: 1.3 % (ref 0.4–3.1)

## 2022-11-23 LAB — IRON AND IRON BINDING CAPACITY (CC-WL,HP ONLY)
Iron: 123 ug/dL (ref 28–170)
Saturation Ratios: 35 % — ABNORMAL HIGH (ref 10.4–31.8)
TIBC: 351 ug/dL (ref 250–450)
UIBC: 228 ug/dL (ref 148–442)

## 2022-11-23 LAB — FERRITIN: Ferritin: 29 ng/mL (ref 11–307)

## 2022-11-23 NOTE — Progress Notes (Signed)
Hematology and Oncology Follow Up Visit  Selena Meyer [id number] [birth year] 37 y.o. 11/23/2022   Principle Diagnosis:  Iron deficiency anemia secondary to heavy cycles     Current Therapy:        IV iron as indicated  Oral iron- every other day    Interim History:  Selena Meyer is here today for follow-up.  Today she reports that she is doing well but is tired. Her daughter is now 66 months old.  She was seen in the ER for dehydration, hypotension and dizziness about 10 days ago. She has been well hydrated since and symptoms have resolved.   No fever, chills, n/v, cough, rash, dizziness, SOB, chest pain, palpitations, abdominal pain or changes in bowel or bladder habits.  Intermittent puffiness in her feet and ankles comes and goes.  She has had occasional sciatica in her left upper leg.  No falls or syncope reported.  Appetite and hydration are good. Wt Readings from Last 3 Encounters:  11/23/22 175 lb 1.9 oz (79.4 kg)  11/20/22 175 lb (79.4 kg)  11/15/22 174 lb (78.9 kg)   ECOG Performance Status: 1 - Symptomatic but completely ambulatory  Medications:  Allergies as of 11/23/2022       Reactions   Ibuprofen Hives        Medication List        Accurate as of November 23, 2022 12:10 PM. If you have any questions, ask your nurse or doctor.          acetaminophen 500 MG tablet Commonly known as: TYLENOL Take 500 mg by mouth as needed.   albuterol 108 (90 Base) MCG/ACT inhaler Commonly known as: VENTOLIN HFA Inhale 2 puffs into the lungs every 6 (six) hours as needed for wheezing or shortness of breath.   enalapril 5 MG tablet Commonly known as: VASOTEC Take 1 tablet (5 mg total) by mouth 2 (two) times daily.   FeroSul 325 (65 Fe) MG tablet Generic drug: ferrous sulfate Take 1 tablet (325 mg total) by mouth every other day.   ipratropium 0.06 % nasal spray Commonly known as: ATROVENT Place 2 sprays into both nostrils 4 (four) times daily. As needed for  runny nose / postnasal drip   labetalol 200 MG tablet Commonly known as: NORMODYNE Take 2 tablets (400 mg total) by mouth 2 (two) times daily.   MAGnesium-Oxide 400 (240 Mg) MG tablet Generic drug: magnesium oxide Take 0.5 tablets by mouth daily.   metroNIDAZOLE 500 MG tablet Commonly known as: FLAGYL Take 1 tablet (500 mg total) by mouth 2 (two) times daily for 7 days.   multivitamin-prenatal 27-0.8 MG Tabs tablet Take 1 tablet by mouth daily at 12 noon.   NIFEdipine 60 MG 24 hr tablet Commonly known as: ADALAT CC Take 1 tablet (60 mg total) by mouth 2 (two) times daily.        Allergies:  Allergies  Allergen Reactions   Ibuprofen Hives    Past Medical History, Surgical history, Social history, and Family History were reviewed and updated.  Review of Systems: All other 10 point review of systems is negative.   Physical Exam:  weight is 175 lb 1.9 oz (79.4 kg). Her oral temperature is 98.8 F (37.1 C). Her blood pressure is 129/78 and her pulse is 90. Her respiration is 17 and oxygen saturation is 100%.   Wt Readings from Last 3 Encounters:  11/23/22 175 lb 1.9 oz (79.4 kg)  11/20/22 175 lb (79.4 kg)  11/15/22  174 lb (78.9 kg)    Ocular: Sclerae unicteric, pupils equal, round and reactive to light Ear-nose-throat: Oropharynx clear, dentition fair Lymphatic: No cervical or supraclavicular adenopathy Lungs no rales or rhonchi, good excursion bilaterally Heart regular rate and rhythm, no murmur appreciated Abd soft, nontender, positive bowel sounds MSK no focal spinal tenderness, no joint edema Neuro: non-focal, well-oriented, appropriate affect   Lab Results  Component Value Date   WBC 8.9 11/23/2022   HGB 12.8 11/23/2022   HCT 37.5 11/23/2022   MCV 86.4 11/23/2022   PLT 405 (H) 11/23/2022   Lab Results  Component Value Date   FERRITIN 17 06/23/2022   IRON 102 06/23/2022   TIBC 405 06/23/2022   UIBC 303 06/23/2022   IRONPCTSAT 25 06/23/2022   Lab  Results  Component Value Date   RETICCTPCT 1.3 11/23/2022   RBC 4.33 11/23/2022   RETICCTABS 63,000 11/08/2020   No results found for: "KPAFRELGTCHN", "LAMBDASER", "KAPLAMBRATIO" No results found for: "IGGSERUM", "IGA", "IGMSERUM" No results found for: "TOTALPROTELP", "ALBUMINELP", "A1GS", "A2GS", "BETS", "BETA2SER", "GAMS", "MSPIKE", "SPEI"   Chemistry      Component Value Date/Time   NA 142 11/20/2022 0934   K 4.1 11/20/2022 0934   CL 104 11/20/2022 0934   CO2 20 11/20/2022 0934   BUN 16 11/20/2022 0934   CREATININE 1.20 (H) 11/20/2022 0934   CREATININE 1.37 (H) 12/16/2021 0857   CREATININE 1.34 (H) 11/15/2021 0000      Component Value Date/Time   CALCIUM 9.7 11/20/2022 0934   ALKPHOS 96 11/20/2022 0934   AST 24 11/20/2022 0934   AST 13 (L) 12/16/2021 0857   ALT 23 11/20/2022 0934   ALT 13 12/16/2021 0857   BILITOT <0.2 11/20/2022 0934   BILITOT 0.4 12/16/2021 0857     Encounter Diagnosis  Name Primary?   Iron deficiency anemia due to chronic blood loss Yes   Impression and Plan: Selena Meyer is a very pleasant 37 yo African American female with long history of iron deficiency anemia.   Today her Hgb is 12.8 which is significantly improved from previous  Platelets are up slightly to 405 today.  Iron studies are pending.  RTC 3 months APP, lab (CBC, iron, ferritin)  Rushie Chestnut, PA-C 11/14/202412:10 PM

## 2022-11-28 ENCOUNTER — Telehealth: Payer: Self-pay

## 2022-11-28 NOTE — Telephone Encounter (Signed)
Transition Care Management Follow-up Telephone Call Date of discharge and from where: 11/13/2022 [facility] How have you been since you were released from the hospital? Patient stated she is feeling much better. Any questions or concerns? No  Items Reviewed: Did the pt receive and understand the discharge instructions provided? Yes  Medications obtained and verified?  No medication prescribed. Patient has no difficulty obtaining medication Other? No  Any new allergies since your discharge? No  Dietary orders reviewed? Yes Do you have support at home? Yes   Follow up appointments reviewed:  PCP Hospital f/u appt confirmed? No  Scheduled to see  on  @ . Specialist Hospital f/u appt confirmed? Yes  Scheduled to see Brand Males. [place], [place] on 11/23/2022 @ [facility]. Are transportation arrangements needed? No  If their condition worsens, is the pt aware to call PCP or go to the Emergency Dept.? Yes Was the patient provided with contact information for the PCP's office or ED? Yes Was to pt encouraged to call back with questions or concerns? Yes   Debbi Strandberg [facility]  [facility], [facility] Guide Direct Dial: [phone]  Website: [email or web address]

## 2022-12-10 ENCOUNTER — Emergency Department (HOSPITAL_COMMUNITY): Payer: BC Managed Care – PPO

## 2022-12-10 ENCOUNTER — Other Ambulatory Visit: Payer: Self-pay

## 2022-12-10 ENCOUNTER — Encounter (HOSPITAL_COMMUNITY): Payer: Self-pay

## 2022-12-10 ENCOUNTER — Emergency Department (HOSPITAL_COMMUNITY)
Admission: EM | Admit: 2022-12-10 | Discharge: 2022-12-10 | Disposition: A | Discharge status: Home / Self Care | Payer: BC Managed Care – PPO | Attending: Emergency Medicine | Admitting: Emergency Medicine

## 2022-12-10 DIAGNOSIS — I129 Hypertensive chronic kidney disease with stage 1 through stage 4 chronic kidney disease, or unspecified chronic kidney disease: Secondary | ICD-10-CM | POA: Insufficient documentation

## 2022-12-10 DIAGNOSIS — G40909 Epilepsy, unspecified, not intractable, without status epilepticus: Secondary | ICD-10-CM | POA: Insufficient documentation

## 2022-12-10 DIAGNOSIS — Z87891 Personal history of nicotine dependence: Secondary | ICD-10-CM | POA: Diagnosis not present

## 2022-12-10 DIAGNOSIS — Z419 Encounter for procedure for purposes other than remedying health state, unspecified: Secondary | ICD-10-CM | POA: Diagnosis not present

## 2022-12-10 DIAGNOSIS — N182 Chronic kidney disease, stage 2 (mild): Secondary | ICD-10-CM | POA: Insufficient documentation

## 2022-12-10 DIAGNOSIS — R569 Unspecified convulsions: Secondary | ICD-10-CM | POA: Diagnosis not present

## 2022-12-10 DIAGNOSIS — I959 Hypotension, unspecified: Secondary | ICD-10-CM | POA: Diagnosis not present

## 2022-12-10 DIAGNOSIS — R0689 Other abnormalities of breathing: Secondary | ICD-10-CM | POA: Diagnosis not present

## 2022-12-10 DIAGNOSIS — Z79899 Other long term (current) drug therapy: Secondary | ICD-10-CM | POA: Diagnosis not present

## 2022-12-10 DIAGNOSIS — R000 Tachycardia, unspecified: Secondary | ICD-10-CM | POA: Diagnosis not present

## 2022-12-10 LAB — RAPID URINE DRUG SCREEN, HOSP PERFORMED
Amphetamines: NOT DETECTED
Barbiturates: NOT DETECTED
Benzodiazepines: NOT DETECTED
Cocaine: NOT DETECTED
Opiates: NOT DETECTED
Tetrahydrocannabinol: NOT DETECTED

## 2022-12-10 LAB — CBG MONITORING, ED: Glucose-Capillary: 161 mg/dL — ABNORMAL HIGH (ref 70–99)

## 2022-12-10 LAB — CBC WITH DIFFERENTIAL/PLATELET
Abs Immature Granulocytes: 0.01 10*3/uL (ref 0.00–0.07)
Basophils Absolute: 0 10*3/uL (ref 0.0–0.1)
Basophils Relative: 1 %
Eosinophils Absolute: 0.1 10*3/uL (ref 0.0–0.5)
Eosinophils Relative: 1 %
HCT: 36.2 % (ref 36.0–46.0)
Hemoglobin: 11.9 g/dL — ABNORMAL LOW (ref 12.0–15.0)
Immature Granulocytes: 0 %
Lymphocytes Relative: 21 %
Lymphs Abs: 1.7 10*3/uL (ref 0.7–4.0)
MCH: 29.2 pg (ref 26.0–34.0)
MCHC: 32.9 g/dL (ref 30.0–36.0)
MCV: 88.7 fL (ref 80.0–100.0)
Monocytes Absolute: 0.6 10*3/uL (ref 0.1–1.0)
Monocytes Relative: 7 %
Neutro Abs: 5.4 10*3/uL (ref 1.7–7.7)
Neutrophils Relative %: 70 %
Platelets: 389 10*3/uL (ref 150–400)
RBC: 4.08 MIL/uL (ref 3.87–5.11)
RDW: 12.9 % (ref 11.5–15.5)
WBC: 7.8 10*3/uL (ref 4.0–10.5)
nRBC: 0 % (ref 0.0–0.2)

## 2022-12-10 LAB — PREGNANCY, URINE: Preg Test, Ur: NEGATIVE

## 2022-12-10 LAB — TROPONIN I (HIGH SENSITIVITY)
Troponin I (High Sensitivity): 3 ng/L (ref <18)
Troponin I (High Sensitivity): 3 ng/L (ref <18)

## 2022-12-10 LAB — COMPREHENSIVE METABOLIC PANEL
ALT: 25 U/L (ref 0–44)
AST: 26 U/L (ref 15–41)
Albumin: 3.1 g/dL — ABNORMAL LOW (ref 3.5–5.0)
Alkaline Phosphatase: 74 U/L (ref 38–126)
Anion gap: 17 — ABNORMAL HIGH (ref 5–15)
BUN: 12 mg/dL (ref 6–20)
CO2: 14 mmol/L — ABNORMAL LOW (ref 22–32)
Calcium: 9 mg/dL (ref 8.9–10.3)
Chloride: 105 mmol/L (ref 98–111)
Creatinine, Ser: 1.31 mg/dL — ABNORMAL HIGH (ref 0.44–1.00)
GFR, Estimated: 53 mL/min — ABNORMAL LOW (ref >60)
Glucose, Bld: 169 mg/dL — ABNORMAL HIGH (ref 70–99)
Potassium: 4.2 mmol/L (ref 3.5–5.1)
Sodium: 136 mmol/L (ref 135–145)
Total Bilirubin: 0.4 mg/dL (ref <1.2)
Total Protein: 6.6 g/dL (ref 6.5–8.1)

## 2022-12-10 LAB — URINALYSIS, ROUTINE W REFLEX MICROSCOPIC
Bacteria, UA: NONE SEEN
Bilirubin Urine: NEGATIVE
Glucose, UA: NEGATIVE mg/dL
Ketones, ur: NEGATIVE mg/dL
Leukocytes,Ua: NEGATIVE
Nitrite: NEGATIVE
Protein, ur: 100 mg/dL — AB
Specific Gravity, Urine: 1.011 (ref 1.005–1.030)
pH: 5 (ref 5.0–8.0)

## 2022-12-10 LAB — MAGNESIUM: Magnesium: 1.9 mg/dL (ref 1.7–2.4)

## 2022-12-10 MED ORDER — LEVETIRACETAM 500 MG PO TABS: 500.0000 mg | ORAL_TABLET | Freq: Two times a day (BID) | ORAL | 0 refills | Status: DC

## 2022-12-10 MED ORDER — LORAZEPAM 2 MG/ML IJ SOLN
1.0000 mg | Freq: Once | INTRAMUSCULAR | Status: AC
  Administered 2022-12-10: 1 mg via INTRAVENOUS
  Filled 2022-12-10: qty 1

## 2022-12-10 MED ORDER — LEVETIRACETAM IN NACL 1500 MG/100ML IV SOLN
1500.0000 mg | Freq: Once | INTRAVENOUS | Status: AC
  Administered 2022-12-10: 1500 mg via INTRAVENOUS
  Filled 2022-12-10: qty 100

## 2022-12-10 NOTE — ED Provider Notes (Signed)
Received patient in turnover from Dr. Fredderick Phenix.  Please see their note for further details of Hx, PE.  Briefly patient is a 37 y.o. female with a Seizures .  Neurology has seen the patient.  They are recommending Keppra 500 twice daily.  Feel okay for admission follow-up.  Given referral..    Melene Plan, DO 12/10/22 1810

## 2022-12-10 NOTE — ED Triage Notes (Signed)
PT BIB [facility] after experiencing seizure around 630AM. Family states lasted around 1-2 minutes, EMS states post-ictal state for roughly 30 min, GCS of 5 with improvements during transport. Hx of preeclampsia, 10 weeks post partum. Aox4. [facility] BP was 100/60, CBG 148. 20G LH

## 2022-12-10 NOTE — ED Notes (Signed)
 [facility] called.

## 2022-12-10 NOTE — ED Notes (Signed)
Patient was quite drowsy when going to DC patient. PA notified, and we were able to arouse patient and have her stand, though still groggy. Patient used bedside commode then given PO challenge before DC

## 2022-12-10 NOTE — Plan of Care (Signed)
Brief Neuro Note:  23F who is 10 weeks postpartum, pregnancy complicated by eclampsia. She presents with a first time seizure. She had CT Head which is concerning for PRES. She is somnolent but no focal deficit, follows commands for EDP. There is no concern for persistent seizures or subclinical seizures.  Plan: - Agree with getting MRI Brain w + w/o C - Load with Keppra 1500mg  IV once and start her on Keppra 500 BID. - She will need to be on Keppra for a while, can be tapered outpatient once PRES like changes improve/resolve on imaging. - call us after the MRI brain to discuss further recs. - no driving for 6 months. Has to be seizure free before she can resume driving. - Full seizure precautions listed below.  Plan discussed with Trisha Mangle with the ED team over phone.  Ikaika Showers [facility].   Seizure precautions: Per [facility] statutes, patients with seizures are not allowed to drive until they have been seizure-free for six months and cleared by a physician    Use caution when using heavy equipment or power tools. Avoid working on ladders or at heights. Take showers instead of baths. Ensure the water temperature is not too high on the home water heater. Do not go swimming alone. Do not lock yourself in a room alone (i.e. bathroom). When caring for infants or small children, sit down when holding, feeding, or changing them to minimize risk of injury to the child in the event you have a seizure. Maintain good sleep hygiene. Avoid alcohol.    If patient has another seizure, call 911 and bring them back to the ED if: A.  The seizure lasts longer than 5 minutes.      B.  The patient doesn't wake shortly after the seizure or has new problems such as difficulty seeing, speaking or moving following the seizure C.  The patient was injured during the seizure D.  The patient has a temperature over 102 F (39C) E.  The patient vomited during the seizure and now is  having trouble breathing    During the Seizure   - First, ensure adequate ventilation and place patients on the floor on their left side  Loosen clothing around the neck and ensure the airway is patent. If the patient is clenching the teeth, do not force the mouth open with any object as this can cause severe damage - Remove all items from the surrounding that can be hazardous. The patient may be oblivious to what's happening and may not even know what he or she is doing. If the patient is confused and wandering, either gently guide him/her away and block access to outside areas - Reassure the individual and be comforting - Call 911. In most cases, the seizure ends before EMS arrives. However, there are cases when seizures may last over 3 to 5 minutes. Or the individual may have developed breathing difficulties or severe injuries. If a pregnant patient or a person with diabetes develops a seizure, it is prudent to call an ambulance. - Finally, if the patient does not regain full consciousness, then call EMS. Most patients will remain confused for about 45 to 90 minutes after a seizure, so you must use judgment in calling for help. - Avoid restraints but make sure the patient is in a bed with padded side rails - Place the individual in a lateral position with the neck slightly flexed; this will help the saliva drain from the mouth and  prevent the tongue from falling backward - Remove all nearby furniture and other hazards from the area - Provide verbal assurance as the individual is regaining consciousness - Provide the patient with privacy if possible - Call for help and start treatment as ordered by the caregiver    After the Seizure (Postictal Stage)   After a seizure, most patients experience confusion, fatigue, muscle pain and/or a headache. Thus, one should permit the individual to sleep. For the next few days, reassurance is essential. Being calm and helping reorient the person is also of  importance.   Most seizures are painless and end spontaneously. Seizures are not harmful to others but can lead to complications such as stress on the lungs, brain and the heart. Individuals with prior lung problems may develop labored breathing and respiratory distress.

## 2022-12-10 NOTE — Procedures (Signed)
Patient Name: Selena Meyer  MRN: [id number]  Epilepsy Attending: Charlsie Quest  Referring Physician/Provider: Erick Blinks, MD  Date: 12/10/2022 Duration: 22.47 mins  Patient history: 79F who is 10 weeks postpartum, pregnancy complicated by eclampsia. She presents with a first time seizure. She had CT Head which is concerning for PRES. EEG to evaluate for seizure  Level of alertness: Awake, asleep  AEDs during EEG study: LEV  Technical aspects: This EEG study was done with scalp electrodes positioned according to the 10-20 International system of electrode placement. Electrical activity was reviewed with band pass filter of 1-70Hz , sensitivity of 7 uV/mm, display speed of 78mm/sec with a 60Hz  notched filter applied as appropriate. EEG data were recorded continuously and digitally stored.  Video monitoring was available and reviewed as appropriate.  Description: The posterior dominant rhythm consists of 8 Hz activity of moderate voltage (25-35 uV) seen predominantly in posterior head regions, symmetric and reactive to eye opening and eye closing. Sleep was characterized by vertex waves, sleep spindles (12 to 14 Hz), maximal frontocentral region. Frequent spikes were noted in left frontal anterior temporal region, maximal F7/T7. Hyperventilation and photic stimulation were not performed.     ABNORMALITY -Spike, left frontal-anterior temporal region.  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 No seizures were seen throughout the recording.     [name] Annabelle Harman

## 2022-12-10 NOTE — Consult Note (Signed)
NEUROLOGY CONSULT NOTE   Date of service: December 10, 2022 Patient Name: ABBIEGALE ALARID MRN:  [id number] DOB:  1985-11-22 Chief Complaint: "first time seizure" Requesting Provider: Melene Plan, DO  History of Present Illness  Ebby Legleiter Sorace is a 37 y.o. female with history of hypertension, gestational diabetes, tobacco use, 10 weeks postpartum and pregnancy complicated by eclampsia who presents with a first-time seizure.  She denies any prior history of seizures, does endorse that her mother has been diagnosed with epilepsy.  She denies any significant head injury or loss of consciousness, she denies any fever or recent signs or symptoms of an infection.  She endorses sleep deprivation and poor sleep quality since she gave birth to her baby 10 weeks ago.  She does not drink alcohol, does not use any recreational substances.  In the ED, she was loaded with Keppra 15 oh milligrams IV once.  She was also given 1 mg of Ativan.  She had CT head without contrast with possible concern for press.  She had MRI brain without contrast he was negative for any acute intracranial abnormalities.  She had further evaluation with routine EEG which demonstrated left frontal anterior temporal region spikes but no seizures.   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    Gestational diabetes    Hypertension    Hypokalemia 11/02/2020   Irregular menstrual cycle    Irregular periods/menstrual cycles 05/03/2017   Will hold off on Provera or other fertility stuff for now, pt advised pregnancy is not recommended until BP under  better control at least, plus other workup pen   Polycystic ovaries 11/09/2015   Proteinuria 02/28/2013   Stage 2 chronic kidney disease 04/27/2020   SVT (supraventricular tachycardia) (HCC) 11/01/2020   Systolic murmur 07/16/2017   Tobacco abuse 02/28/2013   Uncontrolled hypertension 05/10/2017    Past Surgical History:  Procedure Laterality Date   NO PAST SURGERIES      Family History: Family History  Problem Relation Age of Onset   Diabetes Mother    High blood pressure Mother    Renal Disease Mother    Diabetes Sister    Cancer Maternal Grandmother    Diabetes Maternal Grandmother    High blood pressure Maternal Grandmother    Cancer Maternal Aunt        breast   Other Father     Social History  reports that she quit smoking about 18 months ago. Her smoking use included cigarettes. She started smoking about 6 years ago. She has a 1.3 pack-year smoking history. She has never used smokeless tobacco. She reports that she does not currently use alcohol. She reports that she does not use drugs.  Allergies  Allergen Reactions   Ibuprofen Hives   Benadryl [Diphenhydramine] Hives    Medications  No current facility-administered medications for this encounter.  Current Outpatient Medications:    levETIRAcetam (KEPPRA) 500 MG tablet, Take 1 tablet (500 mg total) by mouth 2 (two) times daily., Disp: 120 tablet, Rfl: 0   acetaminophen (TYLENOL) 500 MG tablet, Take 500 mg by mouth as needed., Disp: , Rfl:  albuterol (VENTOLIN HFA) 108 (90 Base) MCG/ACT inhaler, Inhale 2 puffs into the lungs every 6 (six) hours as needed for wheezing or shortness of breath., Disp: 1 each, Rfl: 1   enalapril (VASOTEC) 5 MG tablet, Take 1 tablet (5 mg total) by mouth 2 (two) times daily., Disp: 60 tablet, Rfl: 3   ferrous sulfate 325 (65 FE) MG tablet, Take 1 tablet (325 mg total) by mouth every other day., Disp: 100 tablet, Rfl: 3   ipratropium (ATROVENT) 0.06 % nasal spray, Place 2 sprays into  both nostrils 4 (four) times daily. As needed for runny nose / postnasal drip, Disp: 15 mL, Rfl: 1   labetalol (NORMODYNE) 200 MG tablet, Take 2 tablets (400 mg total) by mouth 2 (two) times daily., Disp: 90 tablet, Rfl: 0   MAGNESIUM-OXIDE 400 (240 Mg) MG tablet, Take 0.5 tablets by mouth daily. (Patient not taking: Reported on 11/20/2022), Disp: , Rfl:    NIFEdipine (ADALAT CC) 60 MG 24 hr tablet, Take 1 tablet (60 mg total) by mouth 2 (two) times daily., Disp: 60 tablet, Rfl: 1   Prenatal Vit-Fe Fumarate-FA (MULTIVITAMIN-PRENATAL) 27-0.8 MG TABS tablet, Take 1 tablet by mouth daily at 12 noon., Disp: , Rfl:   Vitals   Vitals:   12/10/22 1330 12/10/22 1345 12/10/22 1445 12/10/22 1500  BP: 138/87 (!) 134/92 (!) 152/90 (!) 153/90  Pulse: 72 80 79 79  Resp: 14 13 15 14   Temp:      TempSrc:      SpO2: 98% 97% 97% 97%  Weight:      Height:        Body mass index is 29.12 kg/m.  Physical Exam   General: Laying comfortably in bed; in no acute distress.  HENT: Normal oropharynx and mucosa. Normal external appearance of ears and nose.  Neck: Supple, no pain or tenderness  CV: No JVD. No peripheral edema.  Pulmonary: Symmetric Chest rise. Normal respiratory effort.  Abdomen: Soft to touch, non-tender.  Ext: No cyanosis, edema, or deformity  Skin: No rash. Normal palpation of skin.   Musculoskeletal: Normal digits and nails by inspection. No clubbing.   Neurologic Examination  Mental status/Cognition: somnolent, oriented to self, place, month and year, good attention.  Speech/language: Fluent, comprehension intact, object naming intact, repetition intact.  Cranial nerves:   CN II Pupils equal and reactive to light, no VF deficits    CN III,IV,VI EOM intact, no gaze preference or deviation, no nystagmus    CN V normal sensation in V1, V2, and V3 segments bilaterally    CN VII no asymmetry, no nasolabial fold flattening    CN VIII normal hearing to speech    CN IX & X normal palatal  elevation, no uvular deviation    CN XI 5/5 head turn and 5/5 shoulder shrug bilaterally    CN XII midline tongue protrusion    Motor:  Muscle bulk: normal, tone normal, pronator drift none tremor none Mvmt Root Nerve  Muscle Right Left Comments  SA C5/6 Ax Deltoid 5 5   EF C5/6 Mc Biceps 5 5   EE C6/7/8 Rad Triceps 5 5   WF C6/7 Med FCR     WE C7/8 PIN ECU     F Ab C8/T1 U ADM/FDI 5 5   HF L1/2/3 Fem Illopsoas 5 5   KE L2/3/4 Fem Quad 5 5   DF L4/5 D Peron Tib Ant 5 5   PF S1/2 Tibial Grc/Sol 5 5  Sensation:  Light touch Intact throughout   Pin prick    Temperature    Vibration   Proprioception    Coordination/Complex Motor:  - Finger to Nose intact bilaterally - Heel to shin intact bilaterally - Rapid alternating movement are normal - Gait: Deferred for patient safety.   Labs/Imaging/Neurodiagnostic studies   CBC:  Recent Labs  Lab 12-17-22 0800  WBC 7.8  NEUTROABS 5.4  HGB 11.9*  HCT 36.2  MCV 88.7  PLT 389   Basic Metabolic Panel:  Lab Results  Component Value Date   NA 136 17-Dec-2022   K 4.2 12/17/2022   CO2 14 (L) 12-17-22   GLUCOSE 169 (H) 12/17/22   BUN 12 December 17, 2022   CREATININE 1.31 (H) 12/17/22   CALCIUM 9.0 12/17/2022   GFRNONAA 53 (L) 12-17-22   GFRAA 65 04/26/2020   Lipid Panel:  Lab Results  Component Value Date   LDLCALC  11/15/2021     Comment:     . LDL cholesterol not calculated. Triglyceride levels greater than 400 mg/dL invalidate calculated LDL results. . Reference range: <100 . Desirable range <100 mg/dL for primary prevention;   <70 mg/dL for patients with CHD or diabetic patients  with > or = 2 CHD risk factors. [name] LDL-C is now calculated using the Martin-Hopkins  calculation, which is a validated novel method providing  better accuracy than the Friedewald equation in the  estimation of LDL-C.  Horald Pollen et al. Lenox Ahr. 0932;355(73): 2061-2068  (http://education.[facility].com/faq/FAQ164)    HgbA1c:   Lab Results  Component Value Date   HGBA1C 5.5 08/18/2022   Urine Drug Screen:     Component Value Date/Time   LABOPIA NONE DETECTED Dec 17, 2022 0803   COCAINSCRNUR NONE DETECTED 12-17-22 0803   LABBENZ NONE DETECTED 12/17/22 0803   AMPHETMU NONE DETECTED Dec 17, 2022 0803   THCU NONE DETECTED 2022/12/17 0803   LABBARB NONE DETECTED 12-17-22 0803    Alcohol Level No results found for: "ETH" INR  Lab Results  Component Value Date   INR 0.95 09/14/2017   APTT No results found for: "APTT" AED levels: No results found for: "PHENYTOIN", "ZONISAMIDE", "LAMOTRIGINE", "LEVETIRACETA"  CT Head without contrast(Personally reviewed): Concerning for potential pres.   MRI Brain(Personally reviewed): No acute intracranial abnormalities  Neurodiagnostics rEEG:  This study showed evidence of epileptogenicity arising from left frontal-anterior temporal region. No seizures were seen throughout the recording.  ASSESSMENT   CHARLIEGH HANDY is a 37 y.o. female with first-time seizure and prolonged postictal period with normal MRI brain and routine EEG demonstrating evidence of epileptogenicity arising from left frontal anterior temporal region with noted left frontal anterior temporal region spikes.  No seizures were seen.  EEG suggestive of increased risk of seizures and warrants long-term AEDs.  RECOMMENDATIONS  - Loaded with Keppra 1500mg  IV once - start Keppra 500mg  BID(discussed behavioral side effects with patient and husband and asked them to reach out to outpatient team or return to the ED if experiencing side effects from Keppra) - follow up with neurology outpatient. - no driving for 6 months. Has to be seizure free before she can resume driving. This was discussed with patient and her husband at the bedside. - full seizure precautions listed below. ______________________________________________________________________    Welton Flakes, MD [facility]   Seizure precautions: Per [facility] statutes, patients with seizures are not allowed to drive until they have been seizure-free for six months and cleared by a physician    Use caution when  using heavy equipment or power tools. Avoid working on ladders or at heights. Take showers instead of baths. Ensure the water temperature is not too high on the home water heater. Do not go swimming alone. Do not lock yourself in a room alone (i.e. bathroom). When caring for infants or small children, sit down when holding, feeding, or changing them to minimize risk of injury to the child in the event you have a seizure. Maintain good sleep hygiene. Avoid alcohol.    If patient has another seizure, call 911 and bring them back to the ED if: A.  The seizure lasts longer than 5 minutes.      B.  The patient doesn't wake shortly after the seizure or has new problems such as difficulty seeing, speaking or moving following the seizure C.  The patient was injured during the seizure D.  The patient has a temperature over 102 F (39C) E.  The patient vomited during the seizure and now is having trouble breathing    During the Seizure   - First, ensure adequate ventilation and place patients on the floor on their left side  Loosen clothing around the neck and ensure the airway is patent. If the patient is clenching the teeth, do not force the mouth open with any object as this can cause severe damage - Remove all items from the surrounding that can be hazardous. The patient may be oblivious to what's happening and may not even know what he or she is doing. If the patient is confused and wandering, either gently guide him/her away and block access to outside areas - Reassure the individual and be comforting - Call 911. In most cases, the seizure ends before EMS arrives. However, there are cases when seizures may last over 3 to 5 minutes. Or the individual may have developed breathing  difficulties or severe injuries. If a pregnant patient or a person with diabetes develops a seizure, it is prudent to call an ambulance. - Finally, if the patient does not regain full consciousness, then call EMS. Most patients will remain confused for about 45 to 90 minutes after a seizure, so you must use judgment in calling for help. - Avoid restraints but make sure the patient is in a bed with padded side rails - Place the individual in a lateral position with the neck slightly flexed; this will help the saliva drain from the mouth and prevent the tongue from falling backward - Remove all nearby furniture and other hazards from the area - Provide verbal assurance as the individual is regaining consciousness - Provide the patient with privacy if possible - Call for help and start treatment as ordered by the caregiver    After the Seizure (Postictal Stage)   After a seizure, most patients experience confusion, fatigue, muscle pain and/or a headache. Thus, one should permit the individual to sleep. For the next few days, reassurance is essential. Being calm and helping reorient the person is also of importance.   Most seizures are painless and end spontaneously. Seizures are not harmful to others but can lead to complications such as stress on the lungs, brain and the heart. Individuals with prior lung problems may develop labored breathing and respiratory distress.

## 2022-12-10 NOTE — ED Notes (Signed)
Went to evaluate patient, and she was still quite lethargic, so providers notified about my concern for patient's safety being discharged, that is shared by husband bedside.

## 2022-12-10 NOTE — Progress Notes (Signed)
EEG complete - results pending 

## 2022-12-10 NOTE — Discharge Instructions (Addendum)
No driving. Avoid any activities that you could harm yourself if you have a seizure.  Take your blood pressure medications as prescribed.  See your Primary care Physician for recheck.

## 2022-12-10 NOTE — ED Notes (Signed)
Patient transported to MRI 

## 2022-12-10 NOTE — ED Provider Notes (Signed)
Selena Meyer Provider Note   CSN: [id number] Arrival date & time: 12/10/22  4098     History  Chief Complaint  Patient presents with   Seizures    Selena Meyer is a 37 y.o. female.  Pt reported to have seizure like activity today.  Ems reports pt seems post ictal.  Pt sleepy but answers questions.  Pt had eclampsia during pregnancy and delivered on 9/20.  Pt was hypertensive during pregnancy.  Pt has a history of chronic hypertension, diabetes, svt, glomerulosclerosis and left ventricular hypertrophy    The history is provided by the patient and the EMS personnel.  Seizures      Home Medications Prior to Admission medications   Medication Sig Start Date End Date Taking? Authorizing Provider  acetaminophen (TYLENOL) 500 MG tablet Take 500 mg by mouth as needed.    [provider]  albuterol (VENTOLIN HFA) 108 (90 Base) MCG/ACT inhaler Inhale 2 puffs into the lungs every 6 (six) hours as needed for wheezing or shortness of breath. 04/20/21   [name], NP  enalapril (VASOTEC) 5 MG tablet Take 1 tablet (5 mg total) by mouth 2 (two) times daily. 11/15/22   Milas Hock, MD  ferrous sulfate 325 (65 FE) MG tablet Take 1 tablet (325 mg total) by mouth every other day. 10/02/22   Reva Bores, MD  ipratropium (ATROVENT) 0.06 % nasal spray Place 2 sprays into both nostrils 4 (four) times daily. As needed for runny nose / postnasal drip 05/26/20   Sunnie Nielsen, DO  labetalol (NORMODYNE) 200 MG tablet Take 2 tablets (400 mg total) by mouth 2 (two) times daily. 11/20/22   Tobb, Kardie, DO  MAGNESIUM-OXIDE 400 (240 Mg) MG tablet Take 0.5 tablets by mouth daily. Patient not taking: Reported on 11/20/2022 08/10/22   [provider]  NIFEdipine (ADALAT CC) 60 MG 24 hr tablet Take 1 tablet (60 mg total) by mouth 2 (two) times daily. 10/02/22   Reva Bores, MD  Prenatal Vit-Fe Fumarate-FA (MULTIVITAMIN-PRENATAL) 27-0.8 MG TABS  tablet Take 1 tablet by mouth daily at 12 noon.    [provider]      Allergies    Ibuprofen and Benadryl [diphenhydramine]    Review of Systems   Review of Systems  Neurological:  Positive for seizures.  All other systems reviewed and are negative.   Physical Exam Updated Vital Signs BP 127/87   Pulse 79   Temp 97.7 F (36.5 C) (Oral)   Resp (!) 21   Ht 5\' 5"  (1.651 m)   Wt 79.4 kg   SpO2 98%   BMI 29.12 kg/m  Physical Exam Vitals and nursing note reviewed.  Constitutional:      Appearance: She is well-developed.     Comments: Sleepy   HENT:     Head: Normocephalic.     Right Ear: Tympanic membrane normal.     Left Ear: Tympanic membrane normal.     Mouth/Throat:     Mouth: Mucous membranes are moist.  Cardiovascular:     Rate and Rhythm: Normal rate.  Pulmonary:     Effort: Pulmonary effort is normal.  Abdominal:     General: There is no distension.  Musculoskeletal:        General: Normal range of motion.     Cervical back: Normal range of motion.  Skin:    General: Skin is warm.  Neurological:     General: No focal deficit  present.     Mental Status: She is alert and oriented to person, place, and time.     Cranial Nerves: No cranial nerve deficit.     Motor: No weakness.     Deep Tendon Reflexes: Reflexes normal.     ED Results / Procedures / Treatments   Labs (all labs ordered are listed, but only abnormal results are displayed) Labs Reviewed  CBC WITH DIFFERENTIAL/PLATELET - Abnormal; Notable for the following components:      Result Value   Hemoglobin 11.9 (*)    All other components within normal limits  COMPREHENSIVE METABOLIC PANEL - Abnormal; Notable for the following components:   CO2 14 (*)    Glucose, Bld 169 (*)    Creatinine, Ser 1.31 (*)    Albumin 3.1 (*)    GFR, Estimated 53 (*)    Anion gap 17 (*)    All other components within normal limits  URINALYSIS, ROUTINE W REFLEX MICROSCOPIC - Abnormal; Notable for the  following components:   Color, Urine STRAW (*)    Hgb urine dipstick SMALL (*)    Protein, ur 100 (*)    All other components within normal limits  CBG MONITORING, ED - Abnormal; Notable for the following components:   Glucose-Capillary 161 (*)    All other components within normal limits  MAGNESIUM  RAPID URINE DRUG SCREEN, HOSP PERFORMED  PREGNANCY, URINE  TROPONIN I (HIGH SENSITIVITY)  TROPONIN I (HIGH SENSITIVITY)    EKG None  Radiology CT Head Wo Contrast  Result Date: 12/10/2022 CLINICAL DATA:  Seizure disorder. Mental status change. Ten weeks postpartum. History of preeclampsia. EXAM: CT HEAD WITHOUT CONTRAST TECHNIQUE: Contiguous axial images were obtained from the base of the skull through the vertex without intravenous contrast. RADIATION DOSE REDUCTION: This exam was performed according to the departmental dose-optimization program which includes automated exposure control, adjustment of the mA and/or kV according to patient size and/or use of iterative reconstruction technique. COMPARISON:  CT head without contrast 03/01/2013. FINDINGS: Brain: Subcortical white matter hypoattenuation is present in the occipital lobes bilaterally. No acute cortical infarct is present. No hemorrhage or mass lesion is present. Anterior white matter is within normal limits. Deep brain nuclei are within normal limits. The ventricles are of normal size. No significant extraaxial fluid collection is present. The brainstem and cerebellum are within normal limits. Midline structures are within normal limits. Vascular: No hyperdense vessel or unexpected calcification. Skull: Calvarium is intact. No focal lytic or blastic lesions are present. No significant extracranial soft tissue lesion is present. Sinuses/Orbits: The paranasal sinuses and mastoid air cells are clear. The globes and orbits are within normal limits. IMPRESSION: 1. Subcortical white matter hypoattenuation in the occipital lobes bilaterally. 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 Recommend MRI head without contrast for further evaluation. 2. No acute intracranial abnormality. Electronically Signed   By: Marin Roberts M.D.   On: 12/10/2022 08:45   DG Chest Port 1 View  Result Date: 12/10/2022 CLINICAL DATA:  Seizure EXAM: PORTABLE CHEST 1 VIEW COMPARISON:  02/26/2013 FINDINGS: Extensive artifact from EKG leads. Normal heart size and mediastinal contours. No acute infiltrate or edema. No effusion or pneumothorax. No acute osseous findings. IMPRESSION: No active disease. Electronically Signed   By: Tiburcio Pea M.D.   On: 12/10/2022 08:33    Procedures Procedures    Medications Ordered in ED Medications  LORazepam (ATIVAN) injection 1 mg (has no administration in  time range)    ED Course/ Medical Decision Making/ A&P                                 Medical Decision Making Patient had an episode at home today that appeared to be a seizure.  Patient's husband was present and saw the episode.  EMS solve the end of seizure-like activity.  Amount and/or Complexity of Data Reviewed Independent Historian:     Details: Patient's mother and husband are here. Labs: ordered. Decision-making details documented in ED Course.    Details: Labs ordered reviewed and interpreted. Radiology: ordered and independent interpretation performed. Decision-making details documented in ED Course.    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 Decision-making details documented in ED Course.    Details: EKG normal sinus normal EKG Discussion of management or test interpretation with external provider(s): I discussed the pt with Dr. Keturah Barre Neurologist on call   He advised MRi,  Pt given Keppra 1500mg  IV.  He advised Keppra 500 mg bid.   Pt had an eeg.   Pt advised to follow up  outpt with neurology for evaltuion   Risk Prescription drug management.   On reevaluation,  Pt able to stand. She is able to ambulate with assistance.   Pt wants to go home.          Final Clinical Impression(s) / ED Diagnoses Final diagnoses:  Seizure (HCC)    Rx / DC Orders ED Discharge Orders     None     An After Visit Summary was printed and given to the patient.     Elson Areas, PA-C 12/10/22 1608    Rolan Bucco, MD 12/21/22 1201

## 2022-12-11 ENCOUNTER — Encounter: Payer: Self-pay | Admitting: Family

## 2022-12-12 ENCOUNTER — Encounter: Payer: Self-pay | Admitting: Neurology

## 2022-12-12 ENCOUNTER — Encounter: Payer: Self-pay | Admitting: Family

## 2022-12-12 ENCOUNTER — Ambulatory Visit (INDEPENDENT_AMBULATORY_CARE_PROVIDER_SITE_OTHER): Payer: 59 | Admitting: Neurology

## 2022-12-12 VITALS — BP 118/74 | HR 74 | Ht 65.0 in | Wt 170.0 lb

## 2022-12-12 DIAGNOSIS — G40109 Localization-related (focal) (partial) symptomatic epilepsy and epileptic syndromes with simple partial seizures, not intractable, without status epilepticus: Secondary | ICD-10-CM | POA: Diagnosis not present

## 2022-12-12 MED ORDER — OXCARBAZEPINE 300 MG PO TABS: 300.0000 mg | ORAL_TABLET | Freq: Two times a day (BID) | ORAL | 11 refills | Status: DC

## 2022-12-12 MED ORDER — ALPRAZOLAM 1 MG PO TABS: 1.0000 mg | ORAL_TABLET | ORAL | 0 refills | Status: DC | PRN

## 2022-12-12 MED ORDER — VALTOCO 20 MG DOSE 10 MG/0.1ML NA LQPK: 20.0000 mg | NASAL | 1 refills | Status: DC | PRN

## 2022-12-12 NOTE — Patient Instructions (Addendum)
Plan will be to discontinue Keppra and start oxcarbazepine 300 mg twice daily.  She will continue with both medications for the next 3 days and start reducing the Keppra, 500 mg daily for 2 days then discontinue Keppra. Prescribed Valtoco as rescue medication for seizures lasting more than 2 minutes. Please contact me if you do have a seizure or any other questions or concerns. Discussed driving restriction for the next 6 months Follow-up in 3 months or sooner if worse.

## 2022-12-12 NOTE — Progress Notes (Signed)
[facility]  PATIENT: Selena Meyer DOB: 08-15-1985  REQUESTING CLINICIAN: Melene Plan, DO HISTORY FROM: Patient/Aunt and Chart Review  REASON FOR VISIT: New onset epilepsy    HISTORICAL  CHIEF COMPLAINT:  Chief Complaint  Patient presents with   Hospitalization Follow-up    Rm 12. Accompanied by aunt. NP Urgent Internal ED referral for seizures.     HISTORY OF PRESENT ILLNESS:  This  is a 37 year old woman with past medical history of Hypertension, migraine headaches, preeclampsia who is presenting after first lifetime seizure.  This occurred on December 1 around 6 AM. Patient tells me that she does not remember much from the seizure or the hospitalization but was told by her husband that she had a generalized convulsion lasting 1 to 2-minute.  EMS was called.  In the ED she did have a head CT that was concerning for PRES but her MRI showed FLAIR hyperintensities that favored small microvascular ischemic changes.  Her EEG showed frequent left frontotemporal spikes.  She was loaded on Keppra and started on Keppra 500 mg twice daily.  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 She denies any previous history of seizures and denies any seizure risk factors.  Handedness: Right handed   Onset: December 1  Seizure Type: Generalized convulsion   Current frequency: Only one   Any injuries from seizures: Denies   Seizure risk factors: None reported   Previous ASMs: None   Currenty ASMs: Keppra   ASMs side effects: She already suffers form post partum depression  Brain Images: Flair hyperintensities  Previous EEGs: Left frontotemporal epileptiform discharges    OTHER MEDICAL CONDITIONS: Hypertension, History of Migraines, Pre-eclampsia   REVIEW OF SYSTEMS: Full 14 system review of systems performed and negative with exception of: As noted in the HPI  ALLERGIES: Allergies  Allergen  Reactions   Ibuprofen Hives    HOME MEDICATIONS: Outpatient Medications Prior to Visit  Medication Sig Dispense Refill   acetaminophen (TYLENOL) 500 MG tablet Take 500 mg by mouth as needed.     albuterol (VENTOLIN HFA) 108 (90 Base) MCG/ACT inhaler Inhale 2 puffs into the lungs every 6 (six) hours as needed for wheezing or shortness of breath. 1 each 1   enalapril (VASOTEC) 5 MG tablet Take 1 tablet (5 mg total) by mouth 2 (two) times daily. 60 tablet 3   ferrous sulfate 325 (65 FE) MG tablet Take 1 tablet (325 mg total) by mouth every other day. 100 tablet 3   ipratropium (ATROVENT) 0.06 % nasal spray Place 2 sprays into both nostrils 4 (four) times daily. As needed for runny nose / postnasal drip 15 mL 1   labetalol (NORMODYNE) 200 MG tablet Take 2 tablets (400 mg total) by mouth 2 (two) times daily. 90 tablet 0   NIFEdipine (ADALAT CC) 60 MG 24 hr tablet Take 1 tablet (60 mg total) by mouth 2 (two) times daily. 60 tablet 1   Prenatal Vit-Fe Fumarate-FA (MULTIVITAMIN-PRENATAL) 27-0.8 MG TABS tablet Take 1 tablet by mouth daily at 12 noon.     levETIRAcetam (KEPPRA) 500 MG tablet Take 1 tablet (500 mg total) by mouth 2 (two) times daily. 120 tablet 0   MAGNESIUM-OXIDE 400 (240 Mg) MG tablet Take 0.5 tablets by mouth daily. (Patient not taking: Reported on 11/20/2022)     No facility-administered medications prior to visit.    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    Gestational diabetes    Hypertension    Hypokalemia 11/02/2020   Irregular menstrual cycle    Irregular periods/menstrual cycles 05/03/2017   Will hold off on Provera or other fertility stuff for  now, pt advised pregnancy is not recommended until BP under better control at least, plus other workup pen   Polycystic ovaries 11/09/2015   Proteinuria 02/28/2013   Stage 2 chronic kidney disease 04/27/2020   SVT (supraventricular tachycardia) (HCC) 11/01/2020   Systolic murmur 07/16/2017   Tobacco abuse 02/28/2013   Uncontrolled hypertension 05/10/2017    PAST SURGICAL HISTORY: Past Surgical History:  Procedure Laterality Date   NO PAST SURGERIES      FAMILY HISTORY: Family History  Problem Relation Age of Onset   Diabetes Mother    High blood pressure Mother    Renal Disease Mother    Diabetes Sister    Cancer Maternal Grandmother    Diabetes Maternal Grandmother    High blood pressure Maternal Grandmother    Cancer Maternal Aunt        breast   Other Father     SOCIAL HISTORY: Social History   Socioeconomic History   Marital status: Married    Spouse name: Ivar Drape   Number of children: 0   Years of education: Not on file   Highest education level: Not on file  Occupational History    Employer: UNEMPLOYED  Tobacco Use   Smoking status: Former    Current packs/day: 0.00    Average packs/day: 0.3 packs/day for 5.0 years (1.3 ttl pk-yrs)    Types: Cigarettes    Start date: 05-22-16    Quit date: 05/22/21    Years since quitting: 1.5   Smokeless tobacco: Never   Tobacco comments:    Quit smoking 05-14-2023after her mom passed away  Vaping Use   Vaping status: Never Used  Substance and Sexual Activity   Alcohol use: Not Currently    Comment: "occasional use; social drinker"   Drug use: No   Sexual activity: Yes    Partners: Male    Birth control/protection: None    Comment: Pregnant  Other Topics Concern   Not on file  Social History Narrative   Not on file   Social Determinants of Health   Financial Resource Strain: Low Risk  (05/08/2022)   Received from [facility], [facility]   Overall Financial Resource Strain (CARDIA)    Difficulty of  Paying Living Expenses: Not very hard  Food Insecurity: No Food Insecurity (09/28/2022)   Hunger Vital Sign    Worried About Running Out of Food in the Last Year: Never true    Ran Out of Food in the Last Year: Never true  Transportation Needs: No Transportation Needs (09/28/2022)   PRAPARE - Administrator, Civil Service (Medical): No    Lack of Transportation (Non-Medical): No  Physical Activity: Unknown (11/03/2020)   Received from [facility], [facility]   Exercise Vital Sign    Days of Exercise per Week: 5 days    Minutes of Exercise per Session: Patient declined  Stress: Stress Concern Present (11/03/2020)   Received from [facility], [facility]   [facility]  of Occupational Health - Occupational Stress Questionnaire    Feeling of Stress : To some extent  Social Connections: Unknown (05/10/2021)   Received from [facility], [facility]   Social Network    Social Network: Not on file  Intimate Partner Violence: Not At Risk (09/28/2022)   Humiliation, Afraid, Rape, and Kick questionnaire    Fear of Current or Ex-Partner: No    Emotionally Abused: No    Physically Abused: No    Sexually Abused: No    PHYSICAL EXAM  GENERAL EXAM/CONSTITUTIONAL: Vitals:  Vitals:   12/12/22 1606  BP: 118/74  Pulse: 74  Weight: 170 lb (77.1 kg)  Height: 5\' 5"  (1.651 m)   Body mass index is 28.29 kg/m. Wt Readings from Last 3 Encounters:  12/12/22 170 lb (77.1 kg)  12/10/22 175 lb (79.4 kg)  11/23/22 175 lb 1.9 oz (79.4 kg)   Patient is in no distress; well developed, nourished and groomed; neck is supple  MUSCULOSKELETAL: Gait, strength, tone, movements noted in Neurologic exam below  NEUROLOGIC: MENTAL STATUS:      No data to display         awake, alert, oriented to person, place and time recent and remote memory intact normal attention and concentration language fluent, comprehension intact, naming intact fund of knowledge  appropriate  CRANIAL NERVE:  2nd, 3rd, 4th, 6th - Visual fields full to confrontation, extraocular muscles intact, no nystagmus 5th - facial sensation symmetric 7th - facial strength symmetric 8th - hearing intact 9th - palate elevates symmetrically, uvula midline 11th - shoulder shrug symmetric 12th - tongue protrusion midline  MOTOR:  normal bulk and tone, full strength in the BUE, BLE  SENSORY:  normal and symmetric to light touch  COORDINATION:  finger-nose-finger, fine finger movements normal  GAIT/STATION:  normal  DIAGNOSTIC DATA (LABS, IMAGING, TESTING) - I reviewed patient records, labs, notes, testing and imaging myself where available.  Lab Results  Component Value Date   WBC 7.8 12/10/2022   HGB 11.9 (L) 12/10/2022   HCT 36.2 12/10/2022   MCV 88.7 12/10/2022   PLT 389 12/10/2022      Component Value Date/Time   NA 136 12/10/2022 0800   NA 142 11/20/2022 0934   K 4.2 12/10/2022 0800   CL 105 12/10/2022 0800   CO2 14 (L) 12/10/2022 0800   GLUCOSE 169 (H) 12/10/2022 0800   BUN 12 12/10/2022 0800   BUN 16 11/20/2022 0934   CREATININE 1.31 (H) 12/10/2022 0800   CREATININE 1.37 (H) 12/16/2021 0857   CREATININE 1.34 (H) 11/15/2021 0000   CALCIUM 9.0 12/10/2022 0800   PROT 6.6 12/10/2022 0800   PROT 6.7 11/20/2022 0934   ALBUMIN 3.1 (L) 12/10/2022 0800   ALBUMIN 4.1 11/20/2022 0934   AST 26 12/10/2022 0800   AST 13 (L) 12/16/2021 0857   ALT 25 12/10/2022 0800   ALT 13 12/16/2021 0857   ALKPHOS 74 12/10/2022 0800   BILITOT 0.4 12/10/2022 0800   BILITOT <0.2 11/20/2022 0934   BILITOT 0.4 12/16/2021 0857   GFRNONAA 53 (L) 12/10/2022 0800   GFRNONAA 51 (L) 12/16/2021 0857   GFRNONAA 56 (L) 04/26/2020 0802   GFRAA 65 04/26/2020 0802   Lab Results  Component Value Date   CHOL 240 (H) 11/15/2021   HDL 40 (L) 11/15/2021   LDLCALC  11/15/2021     Comment:     . LDL cholesterol not calculated. Triglyceride levels greater than 400 mg/dL invalidate  calculated LDL results. [name]  Reference range: <100 . Desirable range <100 mg/dL for primary prevention;   <70 mg/dL for patients with CHD or diabetic patients  with > or = 2 CHD risk factors. [name] LDL-C is now calculated using the Martin-Hopkins  calculation, which is a validated novel method providing  better accuracy than the Friedewald equation in the  estimation of LDL-C.  Horald Pollen et al. Lenox Ahr. 1610;960(45): 2061-2068  (http://education.[facility].com/faq/FAQ164)    TRIG 492 (H) 11/15/2021   Lab Results  Component Value Date   HGBA1C 5.5 08/18/2022   No results found for: "VITAMINB12" Lab Results  Component Value Date   TSH 1.010 08/03/2022    MRI Brain without contrast 12/10/2022 1. No acute finding or specific cause for seizure. 2. Mild chronic white matter disease, medical history favoring premature chronic small vessel ischemia.  EEG 12/10/2022 -Spike, left frontal-anterior temporal region.   I personally reviewed brain Images and previous EEG reports.   ASSESSMENT AND PLAN  37 y.o. year old female  with history of hypertension, migraine headaches, previous preeclampsia who is presenting after her first lifetime seizure on December 1.  The seizure was described as generalized convulsion.  MRI brain showed flair hyperintensity, will obtain and postcontrast images.  Her EEG shows frequent left frontotemporal spikes.  Informed patient that she meets criteria for epilepsy. Since she does have postpartum depression already, Keppra may not be the best medication for her.  Will prescribe her oxcarbazepine 300 mg twice daily.  Patient will continue both medications for the next 3 days then start reducing the Keppra to 500 mg daily for 2 days then discontinue the medication.  She understands to contact me if she does have any breakthrough seizure or any additional questions or concerns.  Will also prescribe her Valtoco as rescue medication.  We also discussed driving restriction  for the next 6 months. She voices understanding. 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  She will continue with both medications for the next 3 days and start reducing the Keppra, 500 mg daily for 2 days then discontinue Keppra. Prescribed Valtoco as rescue medication for seizures lasting more than 2 minutes. Please contact me if you do have a seizure or any other questions or concerns. Discussed driving restriction for the next 6 months Follow-up in 3 months or sooner if worse.    Per [facility] statutes, patients with seizures are not allowed to drive until they have been seizure-free for six months.  Other recommendations include using caution when using heavy equipment or power tools. Avoid working on ladders or at heights. Take showers instead of baths.  Do not swim alone.  Ensure the water temperature is not too high on the home water heater. Do not go swimming alone. Do not lock yourself in a room alone (i.e. bathroom). When caring for infants or small children, sit down when holding, feeding, or changing them to minimize risk of injury to the child in the event you have a seizure. Maintain good sleep hygiene. Avoid alcohol.  Also recommend adequate sleep, hydration, good diet and minimize stress.   During the Seizure  - First, ensure adequate ventilation and place patients on the floor on their left side  Loosen clothing around the neck and ensure the airway is patent. If the patient is clenching the teeth, do not force the mouth  open with any object as this can cause severe damage - Remove all items from the surrounding that can be hazardous. The patient may be oblivious to what's happening and may not even know what he or she is doing. If the patient is confused and wandering, either gently guide him/her away and block access to outside areas -  Reassure the individual and be comforting - Call 911. In most cases, the seizure ends before EMS arrives. However, there are cases when seizures may last over 3 to 5 minutes. Or the individual may have developed breathing difficulties or severe injuries. If a pregnant patient or a person with diabetes develops a seizure, it is prudent to call an ambulance. - Finally, if the patient does not regain full consciousness, then call EMS. Most patients will remain confused for about 45 to 90 minutes after a seizure, so you must use judgment in calling for help. - Avoid restraints but make sure the patient is in a bed with padded side rails - Place the individual in a lateral position with the neck slightly flexed; this will help the saliva drain from the mouth and prevent the tongue from falling backward - Remove all nearby furniture and other hazards from the area - Provide verbal assurance as the individual is regaining consciousness - Provide the patient with privacy if possible - Call for help and start treatment as ordered by the caregiver   After the Seizure (Postictal Stage)  After a seizure, most patients experience confusion, fatigue, muscle pain and/or a headache. Thus, one should permit the individual to sleep. For the next few days, reassurance is essential. Being calm and helping reorient the person is also of importance.  Most seizures are painless and end spontaneously. Seizures are not harmful to others but can lead to complications such as stress on the lungs, brain and the heart. Individuals with prior lung problems may develop labored breathing and respiratory distress.    Discussed Patients with epilepsy have a small risk of sudden unexpected death, a condition referred to as sudden unexpected death in epilepsy (SUDEP). SUDEP is defined specifically as the sudden, unexpected, witnessed or unwitnessed, nontraumatic and nondrowning death in patients with epilepsy with or without evidence  for a seizure, and excluding documented status epilepticus, in which post mortem examination does not reveal a structural or toxicologic cause for death     Orders Placed This Encounter  Procedures   MR BRAIN W CONTRAST    Meds ordered this encounter  Medications   Oxcarbazepine (TRILEPTAL) 300 MG tablet    Sig: Take 1 tablet (300 mg total) by mouth 2 (two) times daily.    Dispense:  60 tablet    Refill:  11   diazePAM, 20 MG Dose, (VALTOCO 20 MG DOSE) 2 x 10 MG/0.1ML LQPK    Sig: Place 20 mg into the nose as needed (For seizure lasting more than 2 min).    Dispense:  5 each    Refill:  1    Please provide 5 boxes for a total of 10 doses   ALPRAZolam (XANAX) 1 MG tablet    Sig: Take 1 tablet (1 mg total) by mouth as needed for anxiety (For MRI brain).    Dispense:  2 tablet    Refill:  0    Return in about 6 months (around 06/12/2023).    Windell Norfolk, MD 12/12/2022, 6:17 PM  [facility] [address], [address] [place], [place] [address] [phone]

## 2022-12-13 ENCOUNTER — Telehealth: Payer: Self-pay | Admitting: Neurology

## 2022-12-13 NOTE — Telephone Encounter (Signed)
[employer] no auth required sent to [facility] [phone]

## 2022-12-14 ENCOUNTER — Encounter: Payer: Self-pay | Admitting: Cardiology

## 2022-12-15 ENCOUNTER — Encounter: Payer: Self-pay | Admitting: Cardiology

## 2022-12-15 ENCOUNTER — Ambulatory Visit: Payer: BC Managed Care – PPO | Attending: Cardiology | Admitting: Cardiology

## 2022-12-15 VITALS — BP 134/96 | HR 73 | Ht 65.0 in | Wt 171.8 lb

## 2022-12-15 DIAGNOSIS — I471 Supraventricular tachycardia, unspecified: Secondary | ICD-10-CM

## 2022-12-15 DIAGNOSIS — I10 Essential (primary) hypertension: Secondary | ICD-10-CM

## 2022-12-15 DIAGNOSIS — I517 Cardiomegaly: Secondary | ICD-10-CM

## 2022-12-15 DIAGNOSIS — N051 Unspecified nephritic syndrome with focal and segmental glomerular lesions: Secondary | ICD-10-CM | POA: Diagnosis not present

## 2022-12-15 DIAGNOSIS — R569 Unspecified convulsions: Secondary | ICD-10-CM

## 2022-12-15 NOTE — Patient Instructions (Signed)
Medication Instructions:  Your physician recommends that you continue on your current medications as directed. Please refer to the Current Medication list given to you today.  *If you need a refill on your cardiac medications before your next appointment, please call your pharmacy*   Follow-Up: At [facility], you and your health needs are our priority.  As part of our continuing mission to provide you with exceptional heart care, we have created designated Provider Care Teams.  These Care Teams include your primary Cardiologist (physician) and Advanced Practice Providers (APPs -  Physician Assistants and Nurse Practitioners) who all work together to provide you with the care you need, when you need it.   Your next appointment:   4 month(s)  Provider:   Thomasene Ripple, DO     Other Instructions You are invited to attend: [facility] event on February Friday 7th 2025 at [facility] ([address] [place], [place], [place] [address]) from 8am-12pm. Feel free to invite other women to attend!  See you there! Please bring your family.

## 2022-12-15 NOTE — Progress Notes (Signed)
Cardio-Obstetrics Clinic  Follow Up Note   Date:  12/15/2022   ID:  Selena Meyer, DOB [birth year], MRN [id number]  PCP:  [name], NP   [facility] Providers Cardiologist:  Thomasene Ripple, DO  Electrophysiologist:  None        Referring MD: [name], NP   Chief Complaint: " I am doing well"  History of Present Illness:    Selena Meyer is a 37 y.o. female [G1P0101] who returns for follow up for postpartum cardiovascular care.    Medical includes SVT, Hypertension, hypertriglyceridemia, FSGS, gestational diabetes.   At her last visit we adjusted her medication given her significant hypotension.   Since her last visit she tells me that she had a seizure in her sleep, she was seen in the ED on 12/10/2022 and had been started on Keppra.  She reports that she was asleep when the seizure occurred and was unaware of the event until she was informed by her partner. She denies a history of seizures and reports that an MRI was performed at the hospital. The neurologist informed her that she has been having seizures since birth, likely triggered by lack of sleep. She denies having any similar episodes as a child.  The patient also reports that her blood pressure has been slightly high, but generally normal at home. She did not take her blood pressure medication on the day of the visit because she took it at 10 o'clock the previous night.  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    Gestational diabetes    Hypertension    Hypokalemia 11/02/2020   Irregular menstrual cycle    Irregular  periods/menstrual cycles 05/03/2017   Will hold off on Provera or other fertility stuff for now, pt advised pregnancy is not recommended until BP under better control at least, plus other workup pen   Polycystic ovaries 11/09/2015   Proteinuria 02/28/2013   Stage 2 chronic kidney disease 04/27/2020   SVT (supraventricular tachycardia) (HCC) 11/01/2020   Systolic murmur 07/16/2017   Tobacco abuse 02/28/2013   Uncontrolled hypertension 05/10/2017    Past Surgical History:  Procedure Laterality Date   NO PAST SURGERIES        OB History     Gravida  1   Para  1   Term      Preterm  1   AB      Living  1      SAB      IAB      Ectopic      Multiple  0   Live Births  1               Current Medications: Current Meds  Medication Sig   acetaminophen (TYLENOL) 500 MG tablet Take 500 mg by mouth as needed.   albuterol (VENTOLIN HFA) 108 (90 Base) MCG/ACT inhaler Inhale 2 puffs into the lungs every 6 (six) hours as needed for wheezing or shortness of breath.   ALPRAZolam (XANAX) 1 MG tablet Take 1 tablet (1 mg total) by mouth as needed for anxiety (For MRI brain).   diazePAM, 20 MG Dose, (VALTOCO  20 MG DOSE) 2 x 10 MG/0.1ML LQPK Place 20 mg into the nose as needed (For seizure lasting more than 2 min).   enalapril (VASOTEC) 5 MG tablet Take 1 tablet (5 mg total) by mouth 2 (two) times daily.   ferrous sulfate 325 (65 FE) MG tablet Take 1 tablet (325 mg total) by mouth every other day.   ipratropium (ATROVENT) 0.06 % nasal spray Place 2 sprays into both nostrils 4 (four) times daily. As needed for runny nose / postnasal drip   labetalol (NORMODYNE) 200 MG tablet Take 2 tablets (400 mg total) by mouth 2 (two) times daily.   levETIRAcetam (KEPPRA) 500 MG tablet Take 500 mg by mouth 2 (two) times daily.   NIFEdipine (ADALAT CC) 60 MG 24 hr tablet Take 1 tablet (60 mg total) by mouth 2 (two) times daily.   Oxcarbazepine (TRILEPTAL) 300 MG tablet Take 1 tablet (300 mg  total) by mouth 2 (two) times daily.   Prenatal Vit-Fe Fumarate-FA (MULTIVITAMIN-PRENATAL) 27-0.8 MG TABS tablet Take 1 tablet by mouth daily at 12 noon.     Allergies:   Ibuprofen   Social History   Socioeconomic History   Marital status: Married    Spouse name: Ivar Drape   Number of children: 0   Years of education: Not on file   Highest education level: Not on file  Occupational History    Employer: UNEMPLOYED  Tobacco Use   Smoking status: Former    Current packs/day: 0.00    Average packs/day: 0.3 packs/day for 5.0 years (1.3 ttl pk-yrs)    Types: Cigarettes    Start date: 05/16/2016    Quit date: 05/16/2021    Years since quitting: 1.5   Smokeless tobacco: Never   Tobacco comments:    Quit smoking 06-07-2023after her mom passed away  Vaping Use   Vaping status: Never Used  Substance and Sexual Activity   Alcohol use: Not Currently    Comment: "occasional use; social drinker"   Drug use: No   Sexual activity: Yes    Partners: Male    Birth control/protection: None    Comment: Pregnant  Other Topics Concern   Not on file  Social History Narrative   Not on file   Social Determinants of Health   Financial Resource Strain: Low Risk  (05/08/2022)   Received from [facility], [facility]   Overall Financial Resource Strain (CARDIA)    Difficulty of Paying Living Expenses: Not very hard  Food Insecurity: No Food Insecurity (09/28/2022)   Hunger Vital Sign    Worried About Running Out of Food in the Last Year: Never true    Ran Out of Food in the Last Year: Never true  Transportation Needs: No Transportation Needs (09/28/2022)   PRAPARE - Administrator, Civil Service (Medical): No    Lack of Transportation (Non-Medical): No  Physical Activity: Unknown (11/03/2020)   Received from [facility], [facility]   Exercise Vital Sign    Days of Exercise per Week: 5 days    Minutes of Exercise per Session: Patient declined  Stress: Stress Concern Present  (11/03/2020)   Received from [facility], [facility] - Occupational Stress Questionnaire    Feeling of Stress : To some extent  Social Connections: Unknown (05/10/2021)   Received from [facility], [facility]   Social Network    Social Network: Not on file      Family History  Problem Relation Age of Onset   Diabetes Mother    High blood pressure Mother    Renal Disease Mother    Diabetes Sister    Cancer Maternal Grandmother    Diabetes Maternal Grandmother    High blood pressure Maternal Grandmother    Cancer Maternal Aunt        breast   Other Father       ROS:   Please see the history of present illness.     All other systems reviewed and are negative.   Labs/EKG Reviewed:    EKG:   EKG is was not ordered today.    Recent Labs: 08/03/2022: TSH 1.010 12/10/2022: ALT 25; BUN 12; Creatinine, Ser 1.31; Hemoglobin 11.9; Magnesium 1.9; Platelets 389; Potassium 4.2; Sodium 136   Recent Lipid Panel Lab Results  Component Value Date/Time   CHOL 240 (H) 11/15/2021 12:00 AM   TRIG 492 (H) 11/15/2021 12:00 AM   HDL 40 (L) 11/15/2021 12:00 AM   CHOLHDL 6.0 (H) 11/15/2021 12:00 AM   LDLCALC  11/15/2021 12:00 AM     Comment:     . LDL cholesterol not calculated. Triglyceride levels greater than 400 mg/dL invalidate calculated LDL results. . Reference range: <100 . Desirable range <100 mg/dL for primary prevention;   <70 mg/dL for patients with CHD or diabetic patients  with > or = 2 CHD risk factors. [name] LDL-C is now calculated using the Martin-Hopkins  calculation, which is a validated novel method providing  better accuracy than the Friedewald equation in the  estimation of LDL-C.  Horald Pollen et al. Lenox Ahr. 7829;562(13): 2061-2068  (http://education.[employer].com/faq/FAQ164)     Physical Exam:    VS:  BP (!) 134/96 (BP Location: Left Arm, Patient Position: Sitting, Cuff Size: Normal)   Pulse 73   Ht 5'  5" (1.651 m)   Wt 171 lb 12.8 oz (77.9 kg)   SpO2 99%   BMI 28.59 kg/m     Wt Readings from Last 3 Encounters:  12/15/22 171 lb 12.8 oz (77.9 kg)  12/12/22 170 lb (77.1 kg)  12/10/22 175 lb (79.4 kg)     GEN:  Well nourished, well developed in no acute distress HEENT: Normal NECK: No JVD; No carotid bruits LYMPHATICS: No lymphadenopathy CARDIAC: RRR, no murmurs, rubs, gallops RESPIRATORY:  Clear to auscultation without rales, wheezing or rhonchi  ABDOMEN: Soft, non-tender, non-distended MUSCULOSKELETAL:  No edema; No deformity  SKIN: Warm and dry NEUROLOGIC:  Alert and oriented x 3 PSYCHIATRIC:  Normal affect    Risk Assessment/Risk Calculators:                  ASSESSMENT & PLAN:    Chronic hypertension Postpartum care   Seizure Disorder New onset seizure with no prior history. MRI scheduled for 01/30/2023 to further evaluate. Neurologist Dr. Levert Feinstein is primary provider for this condition. She has a schedule MRI in Jan and then a follow up after.  Continue Keppra per Neuro - she will need any clearance for this from the neurologist team.   Hypertension Blood pressure slightly elevated during visit, patient did not take medication on the day of the visit. -Continue current antihypertensive medication regimen. -Monitor blood pressure at home.   I also encouraged the patient to increase her caloric and fluid intake given the fact that she is breast-feeding.  She needs a close follow-up.  Will see the patient in 2 weeks.   Patient Instructions  Medication Instructions:  Your physician recommends that you  continue on your current medications as directed. Please refer to the Current Medication list given to you today.  *If you need a refill on your cardiac medications before your next appointment, please call your pharmacy*   Follow-Up: At [facility], you and your health needs are our priority.  As part of our continuing mission to provide you with  exceptional heart care, we have created designated Provider Care Teams.  These Care Teams include your primary Cardiologist (physician) and Advanced Practice Providers (APPs -  Physician Assistants and Nurse Practitioners) who all work together to provide you with the care you need, when you need it.   Your next appointment:   4 month(s)  Provider:   Thomasene Ripple, DO     Other Instructions You are invited to attend: [facility] event on February Friday 7th 2025 at [facility] ([address] [place], [place], [place] [address]) from 8am-12pm. Feel free to invite other women to attend!  See you there! Please bring your family.     Dispo:  No follow-ups on file.   Medication Adjustments/Labs and Tests Ordered: Current medicines are reviewed at length with the patient today.  Concerns regarding medicines are outlined above.  Tests Ordered: No orders of the defined types were placed in this encounter.  Medication Changes: No orders of the defined types were placed in this encounter.

## 2022-12-21 ENCOUNTER — Telehealth: Payer: Self-pay | Admitting: *Deleted

## 2022-12-21 NOTE — Telephone Encounter (Signed)
Pt werner form faxed on 12/21/2022

## 2022-12-25 ENCOUNTER — Encounter: Payer: Self-pay | Admitting: Neurology

## 2022-12-25 NOTE — Telephone Encounter (Signed)
I have met her once, and she only had one seizure. I can write a letter stating that she has one epilepsy but I cannot write a disability letter. Thanks

## 2022-12-28 ENCOUNTER — Other Ambulatory Visit (HOSPITAL_COMMUNITY): Payer: Self-pay

## 2022-12-28 ENCOUNTER — Telehealth: Payer: Self-pay | Admitting: Neurology

## 2022-12-28 ENCOUNTER — Encounter: Payer: Self-pay | Admitting: Family

## 2022-12-28 ENCOUNTER — Telehealth: Payer: Self-pay

## 2022-12-28 NOTE — Telephone Encounter (Signed)
Pharmacy Patient Advocate Encounter   Received notification from Physician's Office that prior authorization for Valtoco 20 MG Dose 10MG /0.1ML liquid is required/requested.   Insurance verification completed.   The patient is insured through [facility] .   Per test claim: PA required; PA submitted to above mentioned insurance via CoverMyMeds Key/confirmation #/EOC [id number] Status is pending

## 2022-12-28 NOTE — Telephone Encounter (Signed)
Pt states per [id number] a Prior Berkley Harvey is needed for [employer]

## 2022-12-28 NOTE — Telephone Encounter (Signed)
PA request has been Submitted. New Encounter created for follow up. For additional info see Pharmacy Prior Auth telephone encounter from 12/28/2022.

## 2023-01-02 NOTE — Telephone Encounter (Signed)
Pt states she hasn't heard anything regarding the PA needed for medication. Advised per notes it was submitted on 12/28/22 to her ins. Pt will contact her ins to follow up

## 2023-01-09 ENCOUNTER — Inpatient Hospital Stay: Payer: BC Managed Care – PPO | Attending: Hematology & Oncology

## 2023-01-09 VITALS — BP 136/88 | HR 87 | Temp 98.0°F | Resp 17

## 2023-01-09 DIAGNOSIS — D500 Iron deficiency anemia secondary to blood loss (chronic): Secondary | ICD-10-CM | POA: Diagnosis not present

## 2023-01-09 DIAGNOSIS — N920 Excessive and frequent menstruation with regular cycle: Secondary | ICD-10-CM | POA: Diagnosis not present

## 2023-01-09 DIAGNOSIS — D509 Iron deficiency anemia, unspecified: Secondary | ICD-10-CM

## 2023-01-09 MED ORDER — SODIUM CHLORIDE 0.9 % IV SOLN
Freq: Once | INTRAVENOUS | Status: AC
Start: 2023-01-09 — End: 2023-01-09

## 2023-01-09 MED ORDER — SODIUM CHLORIDE 0.9 % IV SOLN
510.0000 mg | Freq: Once | INTRAVENOUS | Status: AC
  Administered 2023-01-09: 510 mg via INTRAVENOUS
  Filled 2023-01-09: qty 17

## 2023-01-09 NOTE — Patient Instructions (Signed)
 Ferumoxytol Injection What is this medication? FERUMOXYTOL (FER ue MOX i tol) treats low levels of iron in your body (iron deficiency anemia). Iron is a mineral that plays an important role in making red blood cells, which carry oxygen from your lungs to the rest of your body. This medicine may be used for other purposes; ask your health care provider or pharmacist if you have questions. COMMON BRAND NAME(S): Feraheme What should I tell my care team before I take this medication? They need to know if you have any of these conditions: Anemia not caused by low iron levels High levels of iron in the blood Magnetic resonance imaging (MRI) test scheduled An unusual or allergic reaction to iron, other medications, foods, dyes, or preservatives Pregnant or trying to get pregnant Breastfeeding How should I use this medication? This medication is injected into a vein. It is given by your care team in a hospital or clinic setting. Talk to your care team the use of this medication in children. Special care may be needed. Overdosage: If you think you have taken too much of this medicine contact a poison control center or emergency room at once. NOTE: This medicine is only for you. Do not share this medicine with others. What if I miss a dose? It is important not to miss your dose. Call your care team if you are unable to keep an appointment. What may interact with this medication? Other iron products This list may not describe all possible interactions. Give your health care provider a list of all the medicines, herbs, non-prescription drugs, or dietary supplements you use. Also tell them if you smoke, drink alcohol, or use illegal drugs. Some items may interact with your medicine. What should I watch for while using this medication? Visit your care team regularly. Tell your care team if your symptoms do not start to get better or if they get worse. You may need blood work done while you are taking this  medication. You may need to follow a special diet. Talk to your care team. Foods that contain iron include: whole grains/cereals, dried fruits, beans, or peas, leafy green vegetables, and organ meats (liver, kidney). What side effects may I notice from receiving this medication? Side effects that you should report to your care team as soon as possible: Allergic reactions--skin rash, itching, hives, swelling of the face, lips, tongue, or throat Low blood pressure--dizziness, feeling faint or lightheaded, blurry vision Shortness of breath Side effects that usually do not require medical attention (report to your care team if they continue or are bothersome): Flushing Headache Joint pain Muscle pain Nausea Pain, redness, or irritation at injection site This list may not describe all possible side effects. Call your doctor for medical advice about side effects. You may report side effects to FDA at 1-800-FDA-1088. Where should I keep my medication? This medication is given in a hospital or clinic. It will not be stored at home. NOTE: This sheet is a summary. It may not cover all possible information. If you have questions about this medicine, talk to your doctor, pharmacist, or health care provider.  2024 Elsevier/Gold Standard (2022-06-02 00:00:00)

## 2023-01-10 DIAGNOSIS — Z419 Encounter for procedure for purposes other than remedying health state, unspecified: Secondary | ICD-10-CM | POA: Diagnosis not present

## 2023-01-10 NOTE — L&D Delivery Note (Addendum)
 OB/GYN Faculty Practice Delivery Note  Selena Meyer is a 38 y.o. G2P0101 s/p SVD at [redacted]w[redacted]d. She was admitted for IOL dur to cHTN w/ SIPE w/ SF .   ROM: 7h 22m with clear fluid GBS Status: negative Maximum Maternal Temperature: 98.4  Labor Progress: Initial SVE 4cm. Reached 10cm after arom and pit.  Delivery Date/Time: 01/07/24 0920 Delivery: Called to room and patient was feeling constant pressure. Found by SNM to be 10cm. Head delivered LOA. No nuchal cord present. Shoulder and body delivered in usual fashion. Infant with spontaneous cry, placed on mother's abdomen, dried and stimulated. Cord clamped x 2 less than 1 min after delivery due to maternal bleeding, and cut by FOB. Cord blood drawn. Placenta delivered manually by CNM, intact, with 3-vessel cord. Fundus boggy with moderate bleeding with massage and Pitocin . Uterus continued to return to boggy with moderate bleeding after multiple uterine sweeps. TXA, methergine, and cytotec  given and Jada placed. Labia, perineum, vagina, and cervix inspected, second degree laceration identified and repaired by CNM with 3.2 vicryl, excellent hemostasis and approximation noted.   Placenta: to L&D Complications: hemorrhage Lacerations: Second degree laceration identified and repaired by CNM with 3.2 vicryl, excellent hemostasis and approximation noted. EBL: 1948 Analgesia: epidural  Postpartum Planning [x ] transfer orders to MB [ ]  discharge summary started & shared [ ]  message to sent to schedule follow-up  [x ] lists updated  Infant: Baby boy  APGARs 8/9  2900g  Conard Me, SNM, RNC-OB Student Nurse Midwife 01/07/2024 11:32 AM   I was present for the entire delivery of baby and performed manual removal of placenta with moderate bleeding was noted less than a minute after delivery and placenta would not deliver with gentle cord traction.  Perineum and cervix inspected and were found to not be the source of bleeding.  Bleeding improved  with removal of placenta and fundal massage but uterus became boggy again and bleeding increased.  Attempted additional sweep of uterus but patient unable to tolerate.  IV fentanyl  given.  CNM swept uterus and removed moderate amount of clots.  Meds given as above.  Foley catheter placed.  Fundus firmed briefly but became boggy again.  Attending called and CNM requested Jada.  CBC and DIC panel drawn.  Second IV started.  Bleeding had stabilized, Uterus swept.  Jada placed.  Bleeding initially was a small amount but then increased and fundus to felt to be rising concerning for filling with clots.  Since patient struggled to tolerate interventions, anesthesia was called to dose epidural.  CNM removed Jada, remove clots.  Replaced Jada and bleeding was minimal after that.  CNM performed repair. I agree with the note above.    Blood pressure dropped after epidural dose.  Discussed with Dr. Meredeth and anesthesia.  Small fluid bolus given due to patient's cardiovascular and renal complications.  Blood pressure still 80s over 50s.  Patient was hard to arouse.  O2 sats dropped into the low 90s.  Oxygen started through nasal cannula.  Ephedrine  given per anesthesia.  Magnesium  sulfate discontinued per Dr. Meredeth.  Magnesium  level drawn.  Normal urine output.  Blood pressure slowly rose and patient became more responsive.  No tachycardia so hypotension felt to be primarily anesthesia related although certainly exacerbated by blood loss.  Patient then became severely hypertensive.  Did not improve with restarting magnesium  sulfate.  IV labetalol  protocol started.  Will give p.o. Procardia  Early.  Claudene Ayvah Caroll , CNM 01/07/2024 3:51 PM

## 2023-01-16 ENCOUNTER — Encounter: Payer: Self-pay | Admitting: Family

## 2023-01-16 ENCOUNTER — Other Ambulatory Visit (HOSPITAL_COMMUNITY): Payer: Self-pay

## 2023-01-16 ENCOUNTER — Inpatient Hospital Stay: Payer: Managed Care, Other (non HMO) | Attending: Hematology & Oncology

## 2023-01-16 VITALS — BP 129/87 | HR 73 | Temp 98.5°F | Resp 20

## 2023-01-16 DIAGNOSIS — N920 Excessive and frequent menstruation with regular cycle: Secondary | ICD-10-CM | POA: Insufficient documentation

## 2023-01-16 DIAGNOSIS — D500 Iron deficiency anemia secondary to blood loss (chronic): Secondary | ICD-10-CM | POA: Diagnosis present

## 2023-01-16 DIAGNOSIS — D509 Iron deficiency anemia, unspecified: Secondary | ICD-10-CM

## 2023-01-16 MED ORDER — SODIUM CHLORIDE 0.9 % IV SOLN: Freq: Once | INTRAVENOUS | Status: AC

## 2023-01-16 MED ORDER — SODIUM CHLORIDE 0.9 % IV SOLN
510.0000 mg | Freq: Once | INTRAVENOUS | Status: AC
  Administered 2023-01-16: 510 mg via INTRAVENOUS
  Filled 2023-01-16: qty 17

## 2023-01-16 NOTE — Patient Instructions (Signed)
 Ferumoxytol Injection What is this medication? FERUMOXYTOL (FER ue MOX i tol) treats low levels of iron in your body (iron deficiency anemia). Iron is a mineral that plays an important role in making red blood cells, which carry oxygen from your lungs to the rest of your body. This medicine may be used for other purposes; ask your health care provider or pharmacist if you have questions. COMMON BRAND NAME(S): Feraheme What should I tell my care team before I take this medication? They need to know if you have any of these conditions: Anemia not caused by low iron levels High levels of iron in the blood Magnetic resonance imaging (MRI) test scheduled An unusual or allergic reaction to iron, other medications, foods, dyes, or preservatives Pregnant or trying to get pregnant Breastfeeding How should I use this medication? This medication is injected into a vein. It is given by your care team in a hospital or clinic setting. Talk to your care team the use of this medication in children. Special care may be needed. Overdosage: If you think you have taken too much of this medicine contact a poison control center or emergency room at once. NOTE: This medicine is only for you. Do not share this medicine with others. What if I miss a dose? It is important not to miss your dose. Call your care team if you are unable to keep an appointment. What may interact with this medication? Other iron products This list may not describe all possible interactions. Give your health care provider a list of all the medicines, herbs, non-prescription drugs, or dietary supplements you use. Also tell them if you smoke, drink alcohol, or use illegal drugs. Some items may interact with your medicine. What should I watch for while using this medication? Visit your care team regularly. Tell your care team if your symptoms do not start to get better or if they get worse. You may need blood work done while you are taking this  medication. You may need to follow a special diet. Talk to your care team. Foods that contain iron include: whole grains/cereals, dried fruits, beans, or peas, leafy green vegetables, and organ meats (liver, kidney). What side effects may I notice from receiving this medication? Side effects that you should report to your care team as soon as possible: Allergic reactions--skin rash, itching, hives, swelling of the face, lips, tongue, or throat Low blood pressure--dizziness, feeling faint or lightheaded, blurry vision Shortness of breath Side effects that usually do not require medical attention (report to your care team if they continue or are bothersome): Flushing Headache Joint pain Muscle pain Nausea Pain, redness, or irritation at injection site This list may not describe all possible side effects. Call your doctor for medical advice about side effects. You may report side effects to FDA at 1-800-FDA-1088. Where should I keep my medication? This medication is given in a hospital or clinic. It will not be stored at home. NOTE: This sheet is a summary. It may not cover all possible information. If you have questions about this medicine, talk to your doctor, pharmacist, or health care provider.  2024 Elsevier/Gold Standard (2022-06-02 00:00:00)

## 2023-01-16 NOTE — Telephone Encounter (Signed)
 Pharmacy Patient Advocate Encounter  Received notification from [employer] that Prior Authorization for Valtoco  has been DENIED.  Due to being a plan Exclusion-insurance will not pay for it or allow a PA to be submitted for it.   PA #/Case ID/Reference #: PA Case ID #: [id number]

## 2023-01-16 NOTE — Telephone Encounter (Signed)
 Pt called to check if have received the PA for Valtoco.

## 2023-01-17 ENCOUNTER — Other Ambulatory Visit: Payer: Self-pay | Admitting: Neurology

## 2023-01-17 MED ORDER — NAYZILAM 5 MG/0.1ML NA SOLN: 5.0000 mg | NASAL | 0 refills | Status: AC | PRN

## 2023-01-17 NOTE — Telephone Encounter (Signed)
 Nayzilam prescribed. Thanks

## 2023-01-17 NOTE — Progress Notes (Signed)
 Valtoco not approved by patient insurance. Will Rx [employer]

## 2023-01-23 ENCOUNTER — Encounter: Payer: Self-pay | Admitting: Neurology

## 2023-01-30 ENCOUNTER — Encounter: Payer: Self-pay | Admitting: Family

## 2023-01-30 ENCOUNTER — Ambulatory Visit
Admission: RE | Admit: 2023-01-30 | Discharge: 2023-01-30 | Disposition: A | Discharge status: Home / Self Care | Payer: Managed Care, Other (non HMO) | Source: Ambulatory Visit | Attending: Neurology | Admitting: Neurology

## 2023-01-30 DIAGNOSIS — G40109 Localization-related (focal) (partial) symptomatic epilepsy and epileptic syndromes with simple partial seizures, not intractable, without status epilepticus: Secondary | ICD-10-CM

## 2023-01-30 MED ORDER — GADOPICLENOL 0.5 MMOL/ML IV SOLN
7.5000 mL | Freq: Once | INTRAVENOUS | Status: AC | PRN
  Administered 2023-01-30: 7.5 mL via INTRAVENOUS

## 2023-02-02 ENCOUNTER — Encounter: Payer: Self-pay | Admitting: Neurology

## 2023-02-05 ENCOUNTER — Telehealth: Payer: Self-pay

## 2023-02-05 NOTE — Telephone Encounter (Signed)
Call to [facility] on [address] removed xanax and keppra from profile.

## 2023-02-05 NOTE — Addendum Note (Signed)
Addended by: Lenn Cal on: 02/05/2023 10:40 AM   Modules accepted: Orders

## 2023-02-10 DIAGNOSIS — Z419 Encounter for procedure for purposes other than remedying health state, unspecified: Secondary | ICD-10-CM | POA: Diagnosis not present

## 2023-02-12 ENCOUNTER — Encounter: Payer: Self-pay | Admitting: Family

## 2023-02-22 ENCOUNTER — Encounter: Payer: Self-pay | Admitting: Medical Oncology

## 2023-02-22 ENCOUNTER — Inpatient Hospital Stay: Payer: Managed Care, Other (non HMO) | Attending: Medical Oncology

## 2023-02-22 ENCOUNTER — Inpatient Hospital Stay (HOSPITAL_BASED_OUTPATIENT_CLINIC_OR_DEPARTMENT_OTHER): Payer: Managed Care, Other (non HMO) | Admitting: Medical Oncology

## 2023-02-22 VITALS — BP 124/70 | HR 78 | Temp 98.2°F | Resp 18 | Ht 65.0 in | Wt 182.0 lb

## 2023-02-22 DIAGNOSIS — N920 Excessive and frequent menstruation with regular cycle: Secondary | ICD-10-CM | POA: Insufficient documentation

## 2023-02-22 DIAGNOSIS — D500 Iron deficiency anemia secondary to blood loss (chronic): Secondary | ICD-10-CM

## 2023-02-22 LAB — IRON AND IRON BINDING CAPACITY (CC-WL,HP ONLY)
Iron: 55 ug/dL (ref 28–170)
Saturation Ratios: 21 % (ref 10.4–31.8)
TIBC: 267 ug/dL (ref 250–450)
UIBC: 212 ug/dL (ref 148–442)

## 2023-02-22 LAB — CBC
HCT: 35.8 % — ABNORMAL LOW (ref 36.0–46.0)
Hemoglobin: 12.5 g/dL (ref 12.0–15.0)
MCH: 30.9 pg (ref 26.0–34.0)
MCHC: 34.9 g/dL (ref 30.0–36.0)
MCV: 88.6 fL (ref 80.0–100.0)
Platelets: 331 10*3/uL (ref 150–400)
RBC: 4.04 MIL/uL (ref 3.87–5.11)
RDW: 14.1 % (ref 11.5–15.5)
WBC: 7.3 10*3/uL (ref 4.0–10.5)
nRBC: 0 % (ref 0.0–0.2)

## 2023-02-22 LAB — FERRITIN: Ferritin: 259 ng/mL (ref 11–307)

## 2023-02-22 NOTE — Progress Notes (Signed)
Hematology and Oncology Follow Up Visit  Selena Meyer [id number] May 09, 1985 38 y.o. 02/22/2023   Principle Diagnosis:  Iron deficiency anemia secondary to heavy cycles     Current Therapy:        IV iron as indicated - feraheme- last given on 01/26/2023 Oral iron- every other day    Interim History:  Selena Meyer is here today for follow-up.  Today she reports that she is doing well. She is sleeping better since her baby is sleeping better. She is staying well hydrated.   No fever, chills, n/v, cough, rash, dizziness, SOB, chest pain, palpitations, abdominal pain or changes in bowel or bladder habits.  Intermittent puffiness in her feet and ankles comes and goes.  There has been no bleeding to her knowledge: denies epistaxis, gingivitis, hemoptysis, hematemesis, hematuria, melena, excessive bruising, blood donation.  She has had occasional sciatica in her left upper leg.  No falls or syncope reported.  Appetite and hydration are good. Wt Readings from Last 3 Encounters:  02/22/23 182 lb (82.6 kg)  12/15/22 171 lb 12.8 oz (77.9 kg)  12/12/22 170 lb (77.1 kg)   ECOG Performance Status: 1 - Symptomatic but completely ambulatory  Medications:  Allergies as of 02/22/2023       Reactions   Ibuprofen Hives        Medication List        Accurate as of February 22, 2023 11:51 AM. If you have any questions, ask your nurse or doctor.          acetaminophen 500 MG tablet Commonly known as: TYLENOL Take 500 mg by mouth as needed.   albuterol 108 (90 Base) MCG/ACT inhaler Commonly known as: VENTOLIN HFA Inhale 2 puffs into the lungs every 6 (six) hours as needed for wheezing or shortness of breath.   enalapril 5 MG tablet Commonly known as: VASOTEC Take 1 tablet (5 mg total) by mouth 2 (two) times daily.   FeroSul 325 (65 Fe) MG tablet Generic drug: ferrous sulfate Take 1 tablet (325 mg total) by mouth every other day.   ipratropium 0.06 % nasal spray Commonly known  as: ATROVENT Place 2 sprays into both nostrils 4 (four) times daily. As needed for runny nose / postnasal drip   labetalol 200 MG tablet Commonly known as: NORMODYNE Take 2 tablets (400 mg total) by mouth 2 (two) times daily.   multivitamin-prenatal 27-0.8 MG Tabs tablet Take 1 tablet by mouth daily at 12 noon.   Nayzilam 5 MG/0.1ML Soln Generic drug: Midazolam Place 5 mg into the nose as needed (For seizure cluster or seizure lasting more than 5 minutes).   NIFEdipine 60 MG 24 hr tablet Commonly known as: ADALAT CC Take 1 tablet (60 mg total) by mouth 2 (two) times daily.   Oxcarbazepine 300 MG tablet Commonly known as: Trileptal Take 1 tablet (300 mg total) by mouth 2 (two) times daily.        Allergies:  Allergies  Allergen Reactions   Ibuprofen Hives    Past Medical History, Surgical history, Social history, and Family History were reviewed and updated.  Review of Systems: All other 10 point review of systems is negative.   Physical Exam:  height is 5\' 5"  (1.651 m) and weight is 182 lb (82.6 kg). Her oral temperature is 98.2 F (36.8 C). Her blood pressure is 124/70 and her pulse is 78. Her respiration is 18 and oxygen saturation is 100%.   Wt Readings from Last 3 Encounters:  02/22/23 182 lb (82.6 kg)  12/15/22 171 lb 12.8 oz (77.9 kg)  12/12/22 170 lb (77.1 kg)    Ocular: Sclerae unicteric, pupils equal, round and reactive to light Ear-nose-throat: Oropharynx clear, dentition fair Lymphatic: No cervical or supraclavicular adenopathy Lungs no rales or rhonchi, good excursion bilaterally Heart regular rate and rhythm, no murmur appreciated Abd soft, nontender, positive bowel sounds MSK no focal spinal tenderness, no joint edema Neuro: non-focal, well-oriented, appropriate affect   Lab Results  Component Value Date   WBC 7.3 02/22/2023   HGB 12.5 02/22/2023   HCT 35.8 (L) 02/22/2023   MCV 88.6 02/22/2023   PLT 331 02/22/2023   Lab Results   Component Value Date   FERRITIN 29 11/23/2022   IRON 123 11/23/2022   TIBC 351 11/23/2022   UIBC 228 11/23/2022   IRONPCTSAT 35 (H) 11/23/2022   Lab Results  Component Value Date   RETICCTPCT 1.3 11/23/2022   RBC 4.04 02/22/2023   RETICCTABS 63,000 11/08/2020   No results found for: "KPAFRELGTCHN", "LAMBDASER", "KAPLAMBRATIO" No results found for: "IGGSERUM", "IGA", "IGMSERUM" No results found for: "TOTALPROTELP", "ALBUMINELP", "A1GS", "A2GS", "BETS", "BETA2SER", "GAMS", "MSPIKE", "SPEI"   Chemistry      Component Value Date/Time   NA 136 12/10/2022 0800   NA 142 11/20/2022 0934   K 4.2 12/10/2022 0800   CL 105 12/10/2022 0800   CO2 14 (L) 12/10/2022 0800   BUN 12 12/10/2022 0800   BUN 16 11/20/2022 0934   CREATININE 1.31 (H) 12/10/2022 0800   CREATININE 1.37 (H) 12/16/2021 0857   CREATININE 1.34 (H) 11/15/2021 0000      Component Value Date/Time   CALCIUM 9.0 12/10/2022 0800   ALKPHOS 74 12/10/2022 0800   AST 26 12/10/2022 0800   AST 13 (L) 12/16/2021 0857   ALT 25 12/10/2022 0800   ALT 13 12/16/2021 0857   BILITOT 0.4 12/10/2022 0800   BILITOT <0.2 11/20/2022 0934   BILITOT 0.4 12/16/2021 0857     Encounter Diagnosis  Name Primary?   Iron deficiency anemia due to chronic blood loss Yes    Impression and Plan: Selena Meyer is a very pleasant 38 yo [place] American female with long history of iron deficiency anemia.   Today her Hgb is 12.5 which is stable Platelets are normal at 331 Iron studies are pending. Will replace if needed  RTC 3 months APP, lab (CBC, iron, ferritin)  Rushie Chestnut, PA-C 2/13/202511:51 AM

## 2023-02-26 ENCOUNTER — Encounter: Payer: Self-pay | Admitting: Medical Oncology

## 2023-03-02 ENCOUNTER — Other Ambulatory Visit (HOSPITAL_COMMUNITY): Payer: Self-pay

## 2023-03-10 DIAGNOSIS — Z419 Encounter for procedure for purposes other than remedying health state, unspecified: Secondary | ICD-10-CM | POA: Diagnosis not present

## 2023-03-21 LAB — LAB REPORT - SCANNED: Creatinine, POC: 251.6 mg/dL

## 2023-03-22 ENCOUNTER — Encounter: Payer: Self-pay | Admitting: Neurology

## 2023-03-22 ENCOUNTER — Ambulatory Visit (INDEPENDENT_AMBULATORY_CARE_PROVIDER_SITE_OTHER): Payer: BC Managed Care – PPO | Admitting: Neurology

## 2023-03-22 VITALS — BP 123/82 | HR 90 | Ht 65.0 in | Wt 184.0 lb

## 2023-03-22 DIAGNOSIS — G40109 Localization-related (focal) (partial) symptomatic epilepsy and epileptic syndromes with simple partial seizures, not intractable, without status epilepticus: Secondary | ICD-10-CM

## 2023-03-22 DIAGNOSIS — Z5181 Encounter for therapeutic drug level monitoring: Secondary | ICD-10-CM

## 2023-03-22 NOTE — Progress Notes (Signed)
 [facility]  PATIENT: Selena Meyer DOB: 10/06/1985  REQUESTING CLINICIAN: [name], NP HISTORY FROM: Patient/Aunt and Chart Review  REASON FOR VISIT: New onset epilepsy    HISTORICAL  CHIEF COMPLAINT:  Chief Complaint  Patient presents with   Follow-up    Pt in 12, here with  Pt is following up on epilepsy. Pt states she has been doing well, no concerns.    INTERVAL HISTORY 03/22/2023:  Patient presents today for follow-up, last visit was in December, at that time we discontinued the Keppra and started her on oxcarbazepine.  She is currently on oxcarbazepine 300 mg twice daily, denies any side effect from the medication and report no additional seizures. No additional complaints or concerns. 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    HISTORY OF PRESENT ILLNESS:  This  is a 38 year old woman with past medical history of Hypertension, migraine headaches, preeclampsia who is presenting after first lifetime seizure.  This occurred on December 1 around 6 AM. Patient tells me that she does not remember much from the seizure or the hospitalization but was told by her husband that she had a generalized convulsion lasting 1 to 2-minute.  EMS was called.  In the ED she did have a head CT that was concerning for PRES but her MRI showed FLAIR hyperintensities that favored small microvascular ischemic changes.  Her EEG showed frequent left frontotemporal spikes.  She was loaded on Keppra and started on Keppra 500 mg twice daily.  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 She denies any previous history of seizures and denies any seizure risk factors.  Handedness: Right handed   Onset: December 1  Seizure Type: Generalized convulsion   Current frequency: Only one   Any injuries from seizures: Denies   Seizure risk factors: None reported   Previous ASMs: None    Currenty ASMs: Keppra   ASMs side effects: She already suffers form post partum depression  Brain Images: Flair hyperintensities  Previous EEGs: Left frontotemporal epileptiform discharges    OTHER MEDICAL CONDITIONS: Hypertension, History of Migraines, Pre-eclampsia   REVIEW OF SYSTEMS: Full 14 system review of systems performed and negative with exception of: As noted in the HPI  ALLERGIES: Allergies  Allergen Reactions   Ibuprofen Hives    HOME MEDICATIONS: Outpatient Medications Prior to Visit  Medication Sig Dispense Refill   acetaminophen (TYLENOL) 500 MG tablet Take 500 mg by mouth as needed.     albuterol (VENTOLIN HFA) 108 (90 Base) MCG/ACT inhaler Inhale 2 puffs into the lungs every 6 (six) hours as needed for wheezing or shortness of breath. 1 each 1   enalapril (VASOTEC) 5 MG tablet Take 1 tablet (5 mg total) by mouth 2 (two) times daily. 60 tablet 3   ferrous sulfate 325 (65 FE) MG tablet Take 1 tablet (325 mg total) by mouth every other day. 100 tablet 3   ipratropium (ATROVENT) 0.06 % nasal spray Place 2 sprays into both nostrils 4 (four) times daily. As needed for runny nose / postnasal drip 15 mL 1   labetalol (NORMODYNE) 200 MG tablet Take 2 tablets (400 mg total) by mouth 2 (two) times daily. 90 tablet 0   Midazolam (NAYZILAM) 5 MG/0.1ML SOLN Place 5 mg into the nose as needed (For seizure cluster or seizure lasting more than 5 minutes). 2 each 0   NIFEdipine (  ADALAT CC) 60 MG 24 hr tablet Take 1 tablet (60 mg total) by mouth 2 (two) times daily. 60 tablet 1   Oxcarbazepine (TRILEPTAL) 300 MG tablet Take 1 tablet (300 mg total) by mouth 2 (two) times daily. 60 tablet 11   Prenatal Vit-Fe Fumarate-FA (MULTIVITAMIN-PRENATAL) 27-0.8 MG TABS tablet Take 1 tablet by mouth daily at 12 noon.     No facility-administered medications prior to visit.    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    Gestational diabetes    Hypertension    Hypokalemia 11/02/2020   Irregular menstrual cycle    Irregular periods/menstrual cycles 05/03/2017   Will hold off on Provera or other fertility stuff for now, pt advised pregnancy is not recommended until BP under better control at least, plus other workup pen   Polycystic ovaries 11/09/2015   Proteinuria 02/28/2013   Stage 2 chronic kidney disease 04/27/2020   SVT (supraventricular tachycardia) (HCC) 11/01/2020   Systolic murmur 07/16/2017   Tobacco abuse 02/28/2013   Uncontrolled hypertension 05/10/2017    PAST SURGICAL HISTORY: Past Surgical History:  Procedure Laterality Date   NO PAST SURGERIES      FAMILY HISTORY: Family History  Problem Relation Age of Onset   Diabetes Mother    High blood pressure Mother    Renal Disease Mother    Diabetes Sister    Cancer Maternal Grandmother    Diabetes Maternal Grandmother    High blood pressure Maternal Grandmother    Cancer Maternal Aunt        breast   Other Father     SOCIAL HISTORY: Social History   Socioeconomic History   Marital status: Married    Spouse name: Ivar Drape   Number of children: 0   Years of education: Not on file   Highest education level: Not on file  Occupational History    Employer: UNEMPLOYED  Tobacco Use   Smoking status: Former    Current packs/day: 0.00    Average packs/day: 0.3 packs/day for 5.0 years (1.3 ttl pk-yrs)    Types: Cigarettes    Start date: 05/16/2016    Quit date: 05/16/2021    Years since quitting: 1.8   Smokeless tobacco: Never   Tobacco comments:    Quit smoking 06-10-23after her mom passed away  Vaping Use   Vaping status: Never Used  Substance and Sexual Activity    Alcohol use: Not Currently    Comment: "occasional use; social drinker"   Drug use: No   Sexual activity: Yes    Partners: Male    Birth control/protection: None    Comment: Pregnant  Other Topics Concern   Not on file  Social History Narrative   Not on file   Social Drivers of Health   Financial Resource Strain: Low Risk  (05/08/2022)   Received from [facility], [facility]   Overall Financial Resource Strain (CARDIA)    Difficulty of Paying Living Expenses: Not very hard  Food Insecurity: No Food Insecurity (09/28/2022)   Hunger Vital Sign    Worried About Running Out of Food in the Last Year: Never true    Ran Out of Food in the Last Year: Never true  Transportation Needs: No Transportation Needs (09/28/2022)   PRAPARE - Administrator, Civil Service (Medical): No    Lack of Transportation (Non-Medical): No  Physical Activity: Unknown (11/03/2020)   Received from [facility], [facility]   Exercise Vital Sign    Days of Exercise per Week: 5 days    Minutes of Exercise per Session: Patient declined  Stress: Stress Concern Present (11/03/2020)   Received from [facility], [facility] of Occupational Health - Occupational Stress Questionnaire    Feeling of Stress : To some extent  Social Connections: Unknown (05/10/2021)   Received from [facility], [facility]   Social Network    Social Network: Not on file  Intimate Partner Violence: Not At Risk (09/28/2022)   Humiliation, Afraid, Rape, and Kick questionnaire    Fear of Current or Ex-Partner: No    Emotionally Abused: No    Physically Abused: No    Sexually Abused: No    PHYSICAL EXAM  GENERAL EXAM/CONSTITUTIONAL: Vitals:  Vitals:   03/22/23 1349  BP: 123/82  Pulse: 90  Weight: 184 lb (83.5 kg)  Height: 5\' 5"  (1.651 m)    Body mass index is 30.62 kg/m. Wt Readings from Last 3 Encounters:  03/22/23 184 lb (83.5 kg)  02/22/23 182 lb (82.6 kg)  12/15/22 171 lb  12.8 oz (77.9 kg)   Patient is in no distress; well developed, nourished and groomed; neck is supple  MUSCULOSKELETAL: Gait, strength, tone, movements noted in Neurologic exam below  NEUROLOGIC: MENTAL STATUS:      No data to display         awake, alert, oriented to person, place and time recent and remote memory intact normal attention and concentration language fluent, comprehension intact, naming intact fund of knowledge appropriate  CRANIAL NERVE:  2nd, 3rd, 4th, 6th - Visual fields full to confrontation, extraocular muscles intact, no nystagmus 5th - facial sensation symmetric 7th - facial strength symmetric 8th - hearing intact 9th - palate elevates symmetrically, uvula midline 11th - shoulder shrug symmetric 12th - tongue protrusion midline  MOTOR:  normal bulk and tone, full strength in the BUE, BLE  SENSORY:  normal and symmetric to light touch  COORDINATION:  finger-nose-finger, fine finger movements normal  GAIT/STATION:  normal  DIAGNOSTIC DATA (LABS, IMAGING, TESTING) - I reviewed patient records, labs, notes, testing and imaging myself where available.  Lab Results  Component Value Date   WBC 7.3 02/22/2023   HGB 12.5 02/22/2023   HCT 35.8 (L) 02/22/2023   MCV 88.6 02/22/2023   PLT 331 02/22/2023      Component Value Date/Time   NA 136 12/10/2022 0800   NA 142 11/20/2022 0934   K 4.2 12/10/2022 0800   CL 105 12/10/2022 0800   CO2 14 (L) 12/10/2022 0800   GLUCOSE 169 (H) 12/10/2022 0800   BUN 12 12/10/2022 0800   BUN 16 11/20/2022 0934   CREATININE 1.31 (H) 12/10/2022 0800   CREATININE 1.37 (H) 12/16/2021 0857   CREATININE 1.34 (H) 11/15/2021 0000   CALCIUM 9.0 12/10/2022 0800   PROT 6.6 12/10/2022 0800   PROT 6.7 11/20/2022 0934   ALBUMIN 3.1 (L) 12/10/2022 0800   ALBUMIN 4.1 11/20/2022 0934   AST 26 12/10/2022 0800   AST 13 (L) 12/16/2021 0857   ALT 25 12/10/2022 0800   ALT 13 12/16/2021 0857   ALKPHOS 74 12/10/2022 0800    BILITOT 0.4 12/10/2022 0800   BILITOT <0.2 11/20/2022  0934   BILITOT 0.4 12/16/2021 0857   GFRNONAA 53 (L) 12/10/2022 0800   GFRNONAA 51 (L) 12/16/2021 0857   GFRNONAA 56 (L) 04/26/2020 0802   GFRAA 65 04/26/2020 0802   Lab Results  Component Value Date   CHOL 240 (H) 11/15/2021   HDL 40 (L) 11/15/2021   LDLCALC  11/15/2021     Comment:     . LDL cholesterol not calculated. Triglyceride levels greater than 400 mg/dL invalidate calculated LDL results. . Reference range: <100 . Desirable range <100 mg/dL for primary prevention;   <70 mg/dL for patients with CHD or diabetic patients  with > or = 2 CHD risk factors. [name] LDL-C is now calculated using the Martin-Hopkins  calculation, which is a validated novel method providing  better accuracy than the Friedewald equation in the  estimation of LDL-C.  Horald Pollen et al. Lenox Ahr. 7253;664(40): 2061-2068  (http://education.[facility].com/faq/FAQ164)    TRIG 492 (H) 11/15/2021   Lab Results  Component Value Date   HGBA1C 5.5 08/18/2022   No results found for: "VITAMINB12" Lab Results  Component Value Date   TSH 1.010 08/03/2022    MRI Brain without contrast 12/10/2022 1. No acute finding or specific cause for seizure. 2. Mild chronic white matter disease, medical history favoring premature chronic small vessel ischemia.  EEG 12/10/2022 -Spike, left frontal-anterior temporal region.   I personally reviewed brain Images and previous EEG reports.   ASSESSMENT AND PLAN  38 y.o. year old female  with history of hypertension, migraine headaches, previous preeclampsia who is presenting for follow-up for seizure disorder.  She is currently on oxcarbazepine 300 mg twice daily, denies any side effect from medication and no additional seizures.  Plan will be for patient to continue with oxcarbazepine, will check a level today with BMP.  I will see her in 6 months for follow-up or sooner if worse. She might resume driving starting May  1.    1. Focal epilepsy (HCC)   2. Therapeutic drug monitoring     Patient Instructions  Continue with oxcarbazepine 300 mg twice daily, refill given Will check oxcarbazepine level and BMP Nayzilam as needed for rescue medication Follow-up in 6 months or sooner if worse.  Per [facility] statutes, patients with seizures are not allowed to drive until they have been seizure-free for six months.  Other recommendations include using caution when using heavy equipment or power tools. Avoid working on ladders or at heights. Take showers instead of baths.  Do not swim alone.  Ensure the water temperature is not too high on the home water heater. Do not go swimming alone. Do not lock yourself in a room alone (i.e. bathroom). When caring for infants or small children, sit down when holding, feeding, or changing them to minimize risk of injury to the child in the event you have a seizure. Maintain good sleep hygiene. Avoid alcohol.  Also recommend adequate sleep, hydration, good diet and minimize stress.   During the Seizure  - First, ensure adequate ventilation and place patients on the floor on their left side  Loosen clothing around the neck and ensure the airway is patent. If the patient is clenching the teeth, do not force the mouth open with any object as this can cause severe damage - Remove all items from the surrounding that can be hazardous. The patient may be oblivious to what's happening and may not even know what he or she is doing. If the patient is confused and wandering, either  gently guide him/her away and block access to outside areas - Reassure the individual and be comforting - Call 911. In most cases, the seizure ends before EMS arrives. However, there are cases when seizures may last over 3 to 5 minutes. Or the individual may have developed breathing difficulties or severe injuries. If a pregnant patient or a person with diabetes develops a seizure, it is prudent to call an  ambulance. - Finally, if the patient does not regain full consciousness, then call EMS. Most patients will remain confused for about 45 to 90 minutes after a seizure, so you must use judgment in calling for help. - Avoid restraints but make sure the patient is in a bed with padded side rails - Place the individual in a lateral position with the neck slightly flexed; this will help the saliva drain from the mouth and prevent the tongue from falling backward - Remove all nearby furniture and other hazards from the area - Provide verbal assurance as the individual is regaining consciousness - Provide the patient with privacy if possible - Call for help and start treatment as ordered by the caregiver   After the Seizure (Postictal Stage)  After a seizure, most patients experience confusion, fatigue, muscle pain and/or a headache. Thus, one should permit the individual to sleep. For the next few days, reassurance is essential. Being calm and helping reorient the person is also of importance.  Most seizures are painless and end spontaneously. Seizures are not harmful to others but can lead to complications such as stress on the lungs, brain and the heart. Individuals with prior lung problems may develop labored breathing and respiratory distress.    Discussed Patients with epilepsy have a small risk of sudden unexpected death, a condition referred to as sudden unexpected death in epilepsy (SUDEP). SUDEP is defined specifically as the sudden, unexpected, witnessed or unwitnessed, nontraumatic and nondrowning death in patients with epilepsy with or without evidence for a seizure, and excluding documented status epilepticus, in which post mortem examination does not reveal a structural or toxicologic cause for death     Orders Placed This Encounter  Procedures   Basic Metabolic Panel   [id number]    No orders of the defined types were placed in this encounter.   Return in about 6 months  (around 09/22/2023).    Windell Norfolk, MD 03/22/2023, 2:14 PM  [facility] [address], [address] [place], [place] [address] [phone]

## 2023-03-22 NOTE — Patient Instructions (Addendum)
 Continue with oxcarbazepine 300 mg twice daily, refill given Will check oxcarbazepine level and BMP Nayzilam as needed for rescue medication Follow-up in 6 months or sooner if worse.

## 2023-03-26 ENCOUNTER — Encounter: Payer: Self-pay | Admitting: Family

## 2023-03-26 ENCOUNTER — Encounter: Payer: Self-pay | Admitting: Neurology

## 2023-03-26 LAB — BASIC METABOLIC PANEL
BUN/Creatinine Ratio: 12 (ref 9–23)
BUN: 17 mg/dL (ref 6–20)
CO2: 21 mmol/L (ref 20–29)
Calcium: 9.2 mg/dL (ref 8.7–10.2)
Chloride: 104 mmol/L (ref 96–106)
Creatinine, Ser: 1.46 mg/dL — ABNORMAL HIGH (ref 0.57–1.00)
Glucose: 146 mg/dL — ABNORMAL HIGH (ref 70–99)
Potassium: 4.2 mmol/L (ref 3.5–5.2)
Sodium: 141 mmol/L (ref 134–144)
eGFR: 47 mL/min/{1.73_m2} — ABNORMAL LOW (ref >59)

## 2023-03-26 LAB — 10-HYDROXYCARBAZEPINE: Oxcarbazepine SerPl-Mcnc: 9 ug/mL — ABNORMAL LOW (ref 10–35)

## 2023-04-21 DIAGNOSIS — Z419 Encounter for procedure for purposes other than remedying health state, unspecified: Secondary | ICD-10-CM | POA: Diagnosis not present

## 2023-04-26 IMAGING — US US BREAST*R* LIMITED INC AXILLA
1 series · 11 of 11 positions shown · non-contrast
Comparison: None.

CLINICAL DATA: 34-year-old female with a palpable right breast lump
2 months ago which has since resolved.

EXAM:
DIGITAL DIAGNOSTIC BILATERAL MAMMOGRAM WITH TOMOSYNTHESIS AND CAD;
ULTRASOUND RIGHT BREAST LIMITED
TECHNIQUE: Bilateral digital diagnostic mammography and breast tomosynthesis
was performed. The images were evaluated with computer-aided
detection.; Targeted ultrasound examination of the right breast was
performed

[Series 1: us breast*right* limited inc axilla · 0.06mm/px · 11 of 11 slices shown]
[im 1/11]
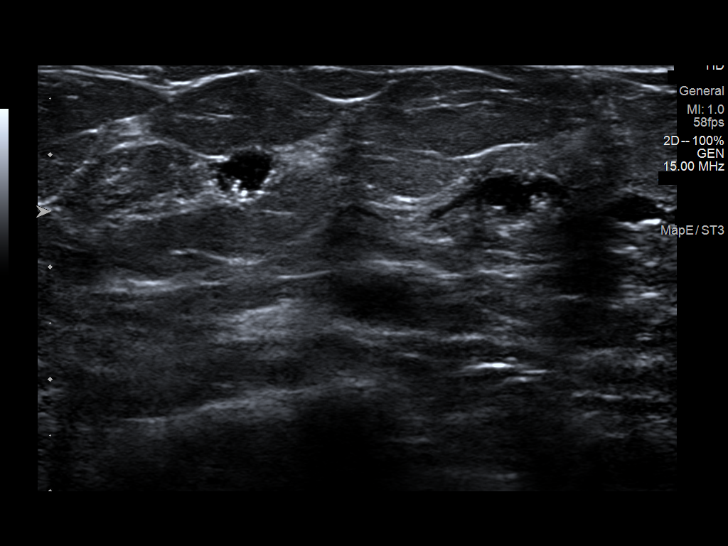
[im 2/11]
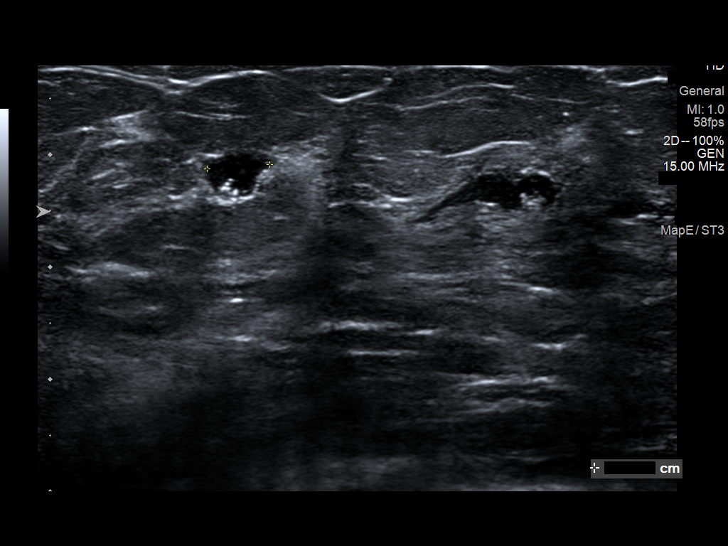
[im 3/11]
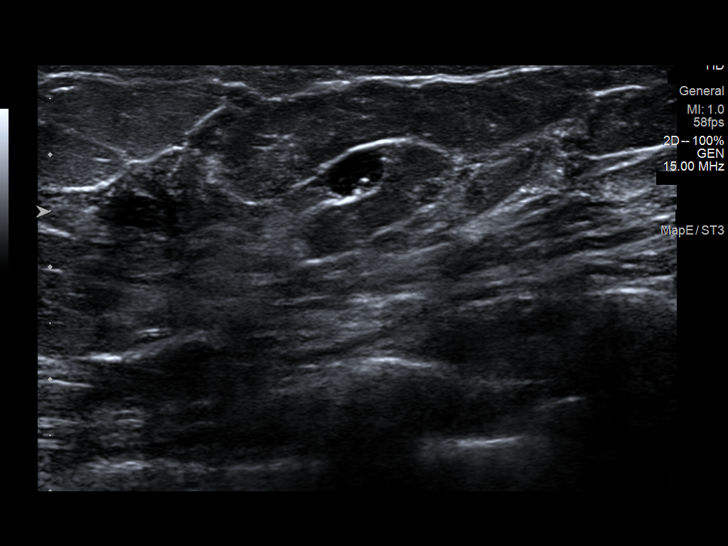
[im 4/11]
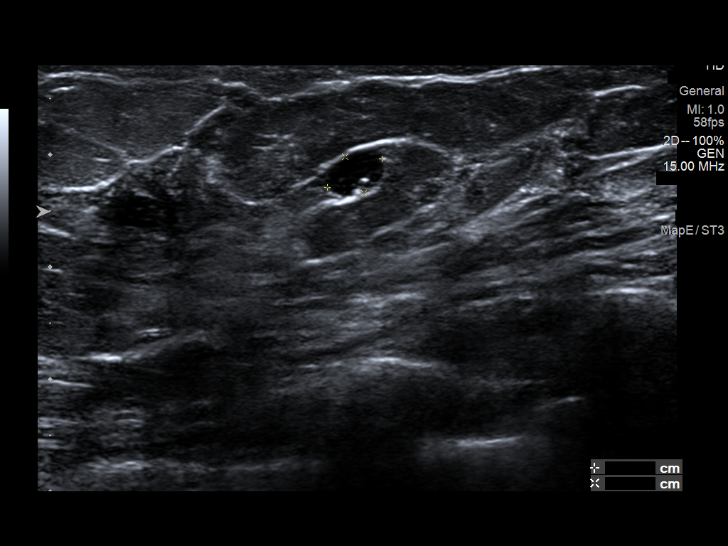
[im 5/11]
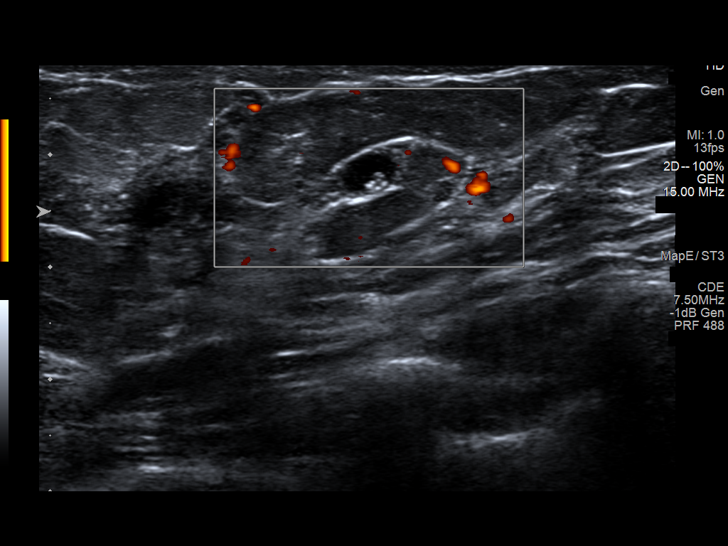
[im 6/11]
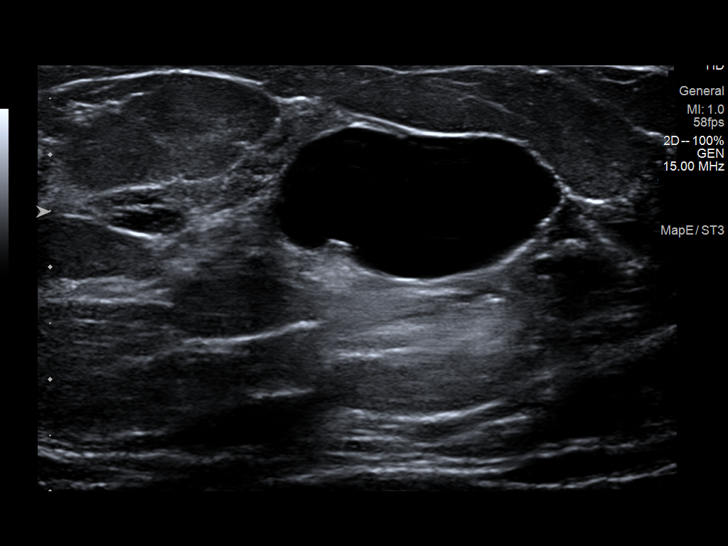
[im 7/11]
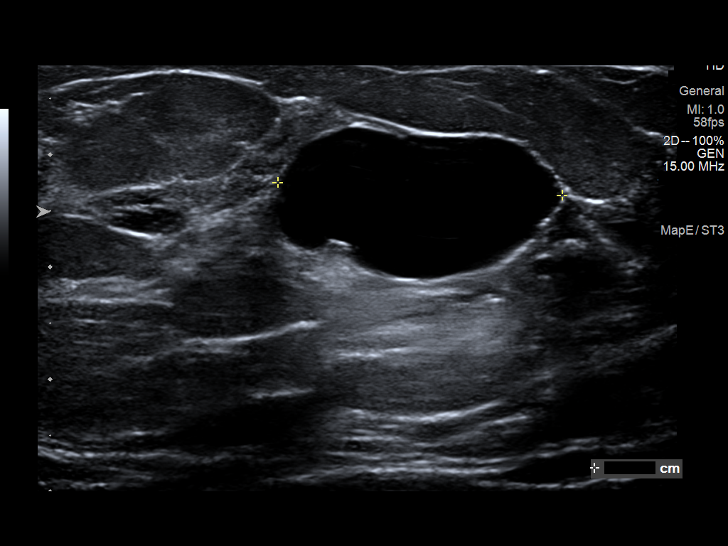
[im 8/11]
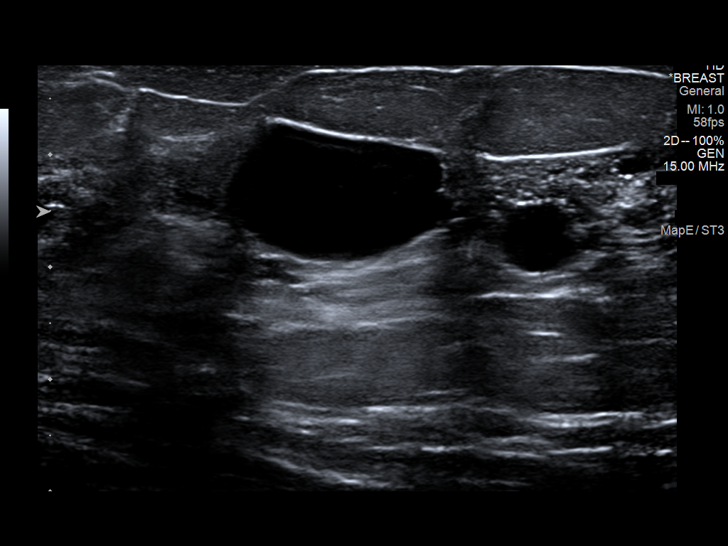
[im 9/11]
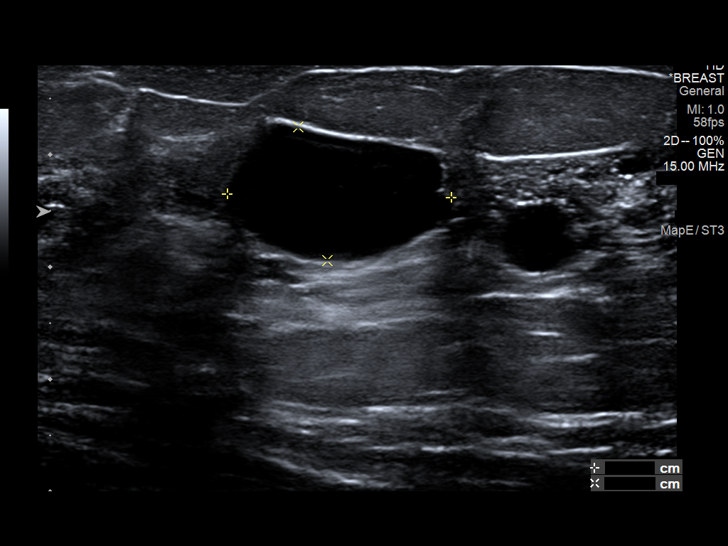
[im 10/11]
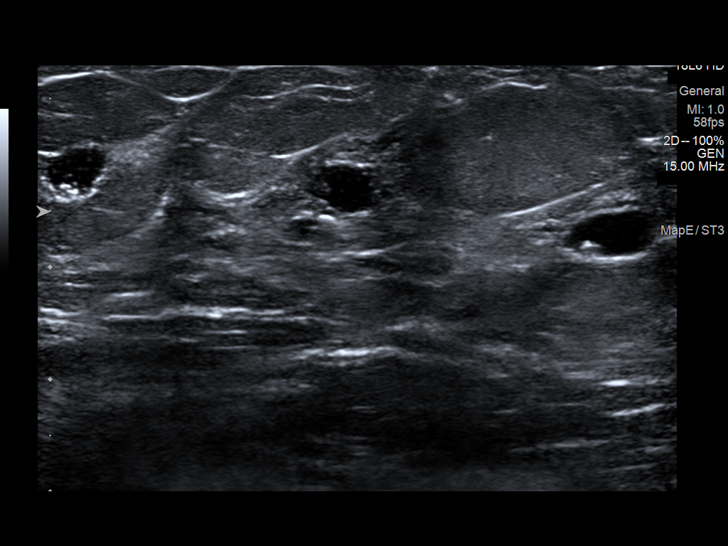
[im 11/11]
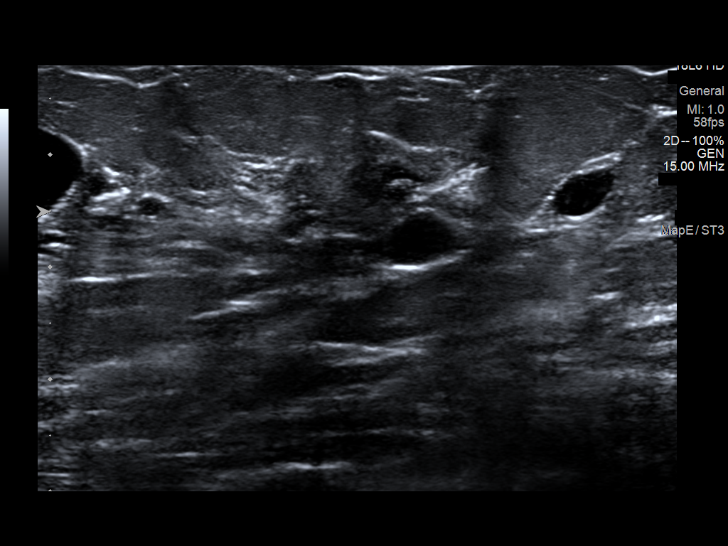

[11 of 11 positions shown; findings below may reference images not displayed]

ACR Breast Density Category c: The breast tissue is heterogeneously
dense, which may obscure small masses.
FINDINGS: Benign oval, circumscribed equal density masses are noted
bilaterally, consistent with a cystic pattern. There are diffuse
punctate and layering calcifications bilaterally. An additional 6 mm
group of punctate calcifications in the upper outer left breast do
not layer. Otherwise, no suspicious findings in the remainder of
either breast.

Targeted ultrasound is performed, showing scattered fibrocystic
changes. The largest cyst measures 2.5 x 2.0 x 1.2 cm at the 10
o'clock position 3 cm from the nipple. This likely corresponds with
the patient's previously palpable lump.
IMPRESSION: 1. Probably benign 6 mm group of punctate calcifications in the
upper-outer left breast. Recommendation is for short-term imaging
follow-up.
2. Bilateral benign fibrocystic changes and milk of calcium. No
further imaging follow-up required for these findings.
3. No mammographic evidence of malignancy on the right.

RECOMMENDATION:
Diagnostic left breast mammogram in 6 months.

I have discussed the findings and recommendations with the patient.
If applicable, a reminder letter will be sent to the patient
regarding the next appointment.

BI-RADS CATEGORY  3: Probably benign.

## 2023-05-21 DIAGNOSIS — Z419 Encounter for procedure for purposes other than remedying health state, unspecified: Secondary | ICD-10-CM | POA: Diagnosis not present

## 2023-05-22 ENCOUNTER — Ambulatory Visit: Payer: Self-pay | Admitting: Medical Oncology

## 2023-05-22 ENCOUNTER — Inpatient Hospital Stay: Payer: Managed Care, Other (non HMO) | Attending: Medical Oncology

## 2023-05-22 ENCOUNTER — Encounter: Payer: Self-pay | Admitting: Medical Oncology

## 2023-05-22 ENCOUNTER — Inpatient Hospital Stay (HOSPITAL_BASED_OUTPATIENT_CLINIC_OR_DEPARTMENT_OTHER): Payer: Managed Care, Other (non HMO) | Admitting: Medical Oncology

## 2023-05-22 VITALS — BP 121/62 | HR 90 | Temp 98.1°F | Resp 18 | Ht 65.0 in | Wt 187.1 lb

## 2023-05-22 DIAGNOSIS — D500 Iron deficiency anemia secondary to blood loss (chronic): Secondary | ICD-10-CM

## 2023-05-22 DIAGNOSIS — N920 Excessive and frequent menstruation with regular cycle: Secondary | ICD-10-CM | POA: Insufficient documentation

## 2023-05-22 LAB — CBC
HCT: 36 % (ref 36.0–46.0)
Hemoglobin: 12.2 g/dL (ref 12.0–15.0)
MCH: 30.7 pg (ref 26.0–34.0)
MCHC: 33.9 g/dL (ref 30.0–36.0)
MCV: 90.5 fL (ref 80.0–100.0)
Platelets: 351 10*3/uL (ref 150–400)
RBC: 3.98 MIL/uL (ref 3.87–5.11)
RDW: 12.9 % (ref 11.5–15.5)
WBC: 10.2 10*3/uL (ref 4.0–10.5)
nRBC: 0 % (ref 0.0–0.2)

## 2023-05-22 LAB — IRON AND IRON BINDING CAPACITY (CC-WL,HP ONLY)
Iron: 109 ug/dL (ref 28–170)
Saturation Ratios: 35 % — ABNORMAL HIGH (ref 10.4–31.8)
TIBC: 314 ug/dL (ref 250–450)
UIBC: 205 ug/dL (ref 148–442)

## 2023-05-22 LAB — FERRITIN: Ferritin: 88 ng/mL (ref 11–307)

## 2023-05-22 NOTE — Progress Notes (Signed)
 Hematology and Oncology Follow Up Visit  Selena Meyer [id number] 03-27-1985 38 y.o. 05/22/2023   Principle Diagnosis:  Iron deficiency anemia secondary to heavy cycles     Current Therapy:        IV iron as indicated - feraheme- last given on 01/26/2023 Oral iron- every other day    Interim History:  Selena Meyer is here today for follow-up.  Today she reports that she is doing well. She brings in her 64 month old daughter  Menstrual cycles are fairly regular  No fever, chills, n/v, cough, rash, dizziness, SOB, chest pain, palpitations, abdominal pain or changes in bowel or bladder habits.  Intermittent puffiness in her feet and ankles comes and goes.  There has been no bleeding to her knowledge: denies epistaxis, gingivitis, hemoptysis, hematemesis, hematuria, melena, excessive bruising, blood donation.  She has had occasional sciatica in her left upper leg. Fortunately this has not been giving her trouble No falls or syncope reported.  Appetite and hydration are good. Wt Readings from Last 3 Encounters:  05/22/23 187 lb 1.9 oz (84.9 kg)  03/22/23 184 lb (83.5 kg)  02/22/23 182 lb (82.6 kg)   ECOG Performance Status: 1 - Symptomatic but completely ambulatory  Medications:  Allergies as of 05/22/2023       Reactions   Ibuprofen Hives        Medication List        Accurate as of May 22, 2023 12:53 PM. If you have any questions, ask your nurse or doctor.          acetaminophen  500 MG tablet Commonly known as: TYLENOL  Take 500 mg by mouth as needed.   albuterol  108 (90 Base) MCG/ACT inhaler Commonly known as: VENTOLIN  HFA Inhale 2 puffs into the lungs every 6 (six) hours as needed for wheezing or shortness of breath.   enalapril  5 MG tablet Commonly known as: VASOTEC  Take 1 tablet (5 mg total) by mouth 2 (two) times daily.   FeroSul 325 (65 Fe) MG tablet Generic drug: ferrous sulfate  Take 1 tablet (325 mg total) by mouth every other day.   ipratropium 0.06 %  nasal spray Commonly known as: ATROVENT  Place 2 sprays into both nostrils 4 (four) times daily. As needed for runny nose / postnasal drip   labetalol  200 MG tablet Commonly known as: NORMODYNE  Take 2 tablets (400 mg total) by mouth 2 (two) times daily.   multivitamin-prenatal 27-0.8 MG Tabs tablet Take 1 tablet by mouth daily at 12 noon.   Nayzilam  5 MG/0.1ML Soln Generic drug: Midazolam  Place 5 mg into the nose as needed (For seizure cluster or seizure lasting more than 5 minutes).   NIFEdipine  60 MG 24 hr tablet Commonly known as: ADALAT  CC Take 1 tablet (60 mg total) by mouth 2 (two) times daily.   Oxcarbazepine  300 MG tablet Commonly known as: Trileptal  Take 1 tablet (300 mg total) by mouth 2 (two) times daily.        Allergies:  Allergies  Allergen Reactions   Ibuprofen Hives    Past Medical History, Surgical history, Social history, and Family History were reviewed and updated.  Review of Systems: All other 10 point review of systems is negative.   Physical Exam:  height is 5\' 5"  (1.651 m) and weight is 187 lb 1.9 oz (84.9 kg). Her oral temperature is 98.1 F (36.7 C). Her blood pressure is 121/62 and her pulse is 90. Her respiration is 18 and oxygen saturation is 100%.  Wt Readings from Last 3 Encounters:  05/22/23 187 lb 1.9 oz (84.9 kg)  03/22/23 184 lb (83.5 kg)  02/22/23 182 lb (82.6 kg)    Ocular: Sclerae unicteric, pupils equal, round and reactive to light Ear-nose-throat: Oropharynx clear, dentition fair Lymphatic: No cervical or supraclavicular adenopathy Lungs no rales or rhonchi, good excursion bilaterally Heart regular rate and rhythm, no murmur appreciated Abd soft, nontender, positive bowel sounds MSK no focal spinal tenderness, no joint edema Neuro: non-focal, well-oriented, appropriate affect   Lab Results  Component Value Date   WBC 10.2 05/22/2023   HGB 12.2 05/22/2023   HCT 36.0 05/22/2023   MCV 90.5 05/22/2023   PLT 351  05/22/2023   Lab Results  Component Value Date   FERRITIN 259 02/22/2023   IRON 55 02/22/2023   TIBC 267 02/22/2023   UIBC 212 02/22/2023   IRONPCTSAT 21 02/22/2023   Lab Results  Component Value Date   RETICCTPCT 1.3 11/23/2022   RBC 3.98 05/22/2023   RETICCTABS 63,000 11/08/2020   No results found for: "KPAFRELGTCHN", "LAMBDASER", "KAPLAMBRATIO" No results found for: "IGGSERUM", "IGA", "IGMSERUM" No results found for: "TOTALPROTELP", "ALBUMINELP", "A1GS", "A2GS", "BETS", "BETA2SER", "GAMS", "MSPIKE", "SPEI"   Chemistry      Component Value Date/Time   NA 141 03/22/2023 1414   K 4.2 03/22/2023 1414   CL 104 03/22/2023 1414   CO2 21 03/22/2023 1414   BUN 17 03/22/2023 1414   CREATININE 1.46 (H) 03/22/2023 1414   CREATININE 1.37 (H) 12/16/2021 0857   CREATININE 1.34 (H) 11/15/2021 0000      Component Value Date/Time   CALCIUM  9.2 03/22/2023 1414   ALKPHOS 74 12/10/2022 0800   AST 26 12/10/2022 0800   AST 13 (L) 12/16/2021 0857   ALT 25 12/10/2022 0800   ALT 13 12/16/2021 0857   BILITOT 0.4 12/10/2022 0800   BILITOT <0.2 11/20/2022 0934   BILITOT 0.4 12/16/2021 0857     Encounter Diagnosis  Name Primary?   Iron deficiency anemia due to chronic blood loss Yes    Impression and Plan: Selena Meyer is a very pleasant 38 yo [place] American female with long history of iron deficiency anemia.   Today her Hgb is 12.2 which is stable Platelets are normal at 351 Iron studies are pending. Will replace if needed  RTC 3 months APP, lab (CBC, iron, ferritin)  Alonza Arthurs [place], PA-C 5/13/202512:53 PM

## 2023-05-25 ENCOUNTER — Ambulatory Visit: Admitting: Certified Nurse Midwife

## 2023-05-25 ENCOUNTER — Encounter: Payer: Self-pay | Admitting: Certified Nurse Midwife

## 2023-05-25 VITALS — BP 113/72 | HR 89 | Ht 65.0 in | Wt 188.0 lb

## 2023-05-25 DIAGNOSIS — Z01419 Encounter for gynecological examination (general) (routine) without abnormal findings: Secondary | ICD-10-CM | POA: Diagnosis not present

## 2023-05-25 DIAGNOSIS — Z1231 Encounter for screening mammogram for malignant neoplasm of breast: Secondary | ICD-10-CM

## 2023-05-25 DIAGNOSIS — Z124 Encounter for screening for malignant neoplasm of cervix: Secondary | ICD-10-CM

## 2023-05-25 DIAGNOSIS — Z3009 Encounter for other general counseling and advice on contraception: Secondary | ICD-10-CM | POA: Diagnosis not present

## 2023-05-25 NOTE — Progress Notes (Signed)
 GYNECOLOGY ANNUAL PREVENTATIVE CARE ENCOUNTER NOTE  History:    Selena Meyer is a 38 y.o. G73P0101 female here for a routine annual gynecologic exam.  Current complaints: none.   Denies abnormal vaginal bleeding, discharge, pelvic pain, problems with intercourse or other gynecologic concerns.  Gynecologic History Patient's last menstrual period was 04/27/2023 (exact date). Contraception: unsure Last Pap: 04/03/22. 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    Gestational diabetes    Hypertension    Hypokalemia 11/02/2020   Irregular menstrual cycle    Irregular periods/menstrual cycles 05/03/2017   Will hold off on Provera or other fertility stuff for now, pt advised pregnancy is not recommended until BP under better control at least, plus other workup pen   Polycystic ovaries 11/09/2015   Proteinuria 02/28/2013   Stage 2 chronic kidney disease 04/27/2020   SVT (supraventricular tachycardia) (HCC) 11/01/2020   Systolic murmur 07/16/2017   Tobacco abuse 02/28/2013   Uncontrolled hypertension 05/10/2017    Past Surgical History:  Procedure Laterality Date   NO PAST SURGERIES      Current Outpatient Medications on File Prior to Visit  Medication Sig Dispense Refill   acetaminophen  (TYLENOL ) 500 MG tablet Take 500 mg by mouth as needed.     albuterol  (VENTOLIN  HFA)  108 (90 Base) MCG/ACT inhaler Inhale 2 puffs into the lungs every 6 (six) hours as needed for wheezing or shortness of breath. 1 each 1   enalapril  (VASOTEC ) 5 MG tablet Take 1 tablet (5 mg total) by mouth 2 (two) times daily. 60 tablet 3   ferrous sulfate  325 (65 FE) MG tablet Take 1 tablet (325 mg total) by mouth every other day. 100 tablet 3   ipratropium (ATROVENT ) 0.06 % nasal spray Place 2 sprays into both nostrils 4 (four) times daily. As needed for runny nose / postnasal drip 15 mL 1   labetalol  (NORMODYNE ) 200 MG tablet Take 2 tablets (400 mg total) by mouth 2 (two) times daily. 90 tablet 0   Midazolam  (NAYZILAM ) 5 MG/0.1ML SOLN Place 5 mg into the nose as needed (For seizure cluster or seizure lasting more than 5 minutes). 2 each 0   NIFEdipine  (ADALAT  CC) 60 MG 24 hr tablet Take 1 tablet (60 mg total) by mouth 2 (two) times daily. 60 tablet 1   Oxcarbazepine  (TRILEPTAL ) 300 MG tablet Take 1 tablet (300 mg total) by mouth 2 (two) times daily. 60 tablet 11   Prenatal Vit-Fe Fumarate-FA (MULTIVITAMIN-PRENATAL) 27-0.8 MG TABS tablet Take 1 tablet by mouth daily at 12 noon.     No current facility-administered medications on file prior to visit.    Allergies  Allergen Reactions   Ibuprofen Hives    Social History:  reports that she quit smoking about 2 years ago. Her  smoking use included cigarettes. She started smoking about 7 years ago. She has a 1.3 pack-year smoking history. She has never used smokeless tobacco. She reports that she does not currently use alcohol. She reports that she does not use drugs.  Family History  Problem Relation Age of Onset   Diabetes Mother    High blood pressure Mother    Renal Disease Mother    Diabetes Sister    Cancer Maternal Grandmother    Diabetes Maternal Grandmother    High blood pressure Maternal Grandmother    Cancer Maternal Aunt        breast   Other Father     The following portions of the patient's history were reviewed and updated  as appropriate: allergies, current medications, past family history, past medical history, past social history, past surgical history and problem list.  Review of Systems Pertinent items noted in HPI and remainder of comprehensive ROS otherwise negative.  Physical Exam:  BP 113/72   Pulse 89   Ht 5\' 5"  (1.651 m)   Wt 188 lb (85.3 kg)   LMP 04/27/2023 (Exact Date)   Breastfeeding No   BMI 31.28 kg/m  CONSTITUTIONAL: Well-developed, well-nourished female in no acute distress.  HENT:  Normocephalic, atraumatic, External right and left ear normal.  EYES: Conjunctivae and EOM are normal. Pupils are equal, round, and reactive to light. No scleral icterus.  NECK: Normal range of motion, supple, no masses observed. SKIN: Skin is warm and dry. No rash noted. Not diaphoretic. No erythema. No pallor. MUSCULOSKELETAL: Normal range of motion. No tenderness.  No cyanosis, clubbing, or edema. NEUROLOGIC: Alert and oriented to person, place, and time. Normal muscle tone coordination.  PSYCHIATRIC: Normal mood and affect. Normal behavior. Normal judgment and thought content. CARDIOVASCULAR: Normal heart rate noted, regular rhythm RESPIRATORY: Clear to auscultation bilaterally. Effort and breath sounds normal, no problems with respiration noted. BREASTS: Symmetric in size. No masses, tenderness, skin changes, nipple drainage, or lymphadenopathy bilaterally. Performed in the presence of a chaperone. ABDOMEN: Soft, no distention noted.  No tenderness, rebound or guarding.  PELVIC: deferred   Assessment and Plan:    1. Encounter for annual routine gynecological examination (Primary) - Patient doing well.  - Adjusting to having a 37 month old at home.   2. Normal Pap smear - PAP not indicated at this time.  - Repeat in 2 years  3. Birth control counseling - Patient currently not using birth control. Discussed Non-hormonal options for contraception. Patient was previously considering Paragard IUD.  Discussed R/B/A and pt remained undecided.  - Reviewed increased fertility after birth and recommended to discuss with her husband.    4. Breast cancer screening by mammogram - Patient will be 38y in August. Recommended to start routine screening. Patient agreeable to plan of care.  - MM 3D SCREENING MAMMOGRAM BILATERAL BREAST; Future  Will follow up results of pap smear and manage accordingly. Normal breast examination today, she was advised to perform periodic self breast examinations.  Mammogram scheduled for breast cancer screening. Routine preventative health maintenance measures emphasized. Please refer to After Visit Summary for other counseling recommendations.      Ahleah Simko Maurie Southern) Marlys Singh, MSN, CNM  Center for [facility]  05/25/2023 12:38 PM

## 2023-05-31 ENCOUNTER — Other Ambulatory Visit: Payer: Self-pay | Admitting: Obstetrics & Gynecology

## 2023-06-05 ENCOUNTER — Ambulatory Visit

## 2023-06-05 VITALS — BP 129/84 | HR 87 | Ht 65.0 in | Wt 187.0 lb

## 2023-06-05 DIAGNOSIS — N926 Irregular menstruation, unspecified: Secondary | ICD-10-CM

## 2023-06-05 DIAGNOSIS — Z3201 Encounter for pregnancy test, result positive: Secondary | ICD-10-CM | POA: Diagnosis not present

## 2023-06-05 LAB — POCT URINE PREGNANCY: Preg Test, Ur: POSITIVE — AB

## 2023-06-05 NOTE — Progress Notes (Signed)
 Selena Meyer here for a UPT. Pt had a positive upt at home. LMP is 04/27/2023.     UPT in office Positive.    Reviewed medications and informed to start a PNV, if not already. Pt to follow up in 6+ weeks for New OB visit.    Evelia Hipp, RN

## 2023-06-18 ENCOUNTER — Other Ambulatory Visit

## 2023-06-21 DIAGNOSIS — Z419 Encounter for procedure for purposes other than remedying health state, unspecified: Secondary | ICD-10-CM | POA: Diagnosis not present

## 2023-06-26 ENCOUNTER — Telehealth: Payer: Self-pay

## 2023-06-26 NOTE — Telephone Encounter (Signed)
 RN returned patient call regarding nausea during pregnancy. RN offered to send patient over the counter list of safe medications to try. RN requested that patient let us  know if symptoms do not improve with the over the counter medications.  Evelia Hipp, RN

## 2023-07-03 ENCOUNTER — Other Ambulatory Visit: Payer: Self-pay

## 2023-07-03 DIAGNOSIS — R110 Nausea: Secondary | ICD-10-CM

## 2023-07-03 MED ORDER — PROMETHAZINE HCL 25 MG PO TABS: 25.0000 mg | ORAL_TABLET | Freq: Four times a day (QID) | ORAL | 1 refills | Status: DC | PRN

## 2023-07-03 NOTE — Progress Notes (Signed)
 RN received mychart message regarding otc medications not working for nausea, currently vomiting 4 times a day. Received verbal orders for Phenergan  from Dr. Cleatus. Spoke with patient and confirmed [facility] pharmacy as preferred pharmacy with instructions.  [name], RN

## 2023-07-09 ENCOUNTER — Ambulatory Visit

## 2023-07-09 ENCOUNTER — Other Ambulatory Visit: Payer: Self-pay

## 2023-07-09 DIAGNOSIS — Z3A11 11 weeks gestation of pregnancy: Secondary | ICD-10-CM | POA: Diagnosis not present

## 2023-07-09 DIAGNOSIS — Z3491 Encounter for supervision of normal pregnancy, unspecified, first trimester: Secondary | ICD-10-CM

## 2023-07-09 DIAGNOSIS — N926 Irregular menstruation, unspecified: Secondary | ICD-10-CM

## 2023-07-12 ENCOUNTER — Ambulatory Visit: Payer: BC Managed Care – PPO | Admitting: Neurology

## 2023-07-15 ENCOUNTER — Other Ambulatory Visit: Payer: Self-pay | Admitting: Obstetrics and Gynecology

## 2023-07-17 ENCOUNTER — Other Ambulatory Visit: Payer: Self-pay

## 2023-07-17 DIAGNOSIS — I10 Essential (primary) hypertension: Secondary | ICD-10-CM

## 2023-07-17 MED ORDER — LABETALOL HCL 200 MG PO TABS: 400.0000 mg | ORAL_TABLET | Freq: Two times a day (BID) | ORAL | 0 refills | Status: DC

## 2023-07-20 ENCOUNTER — Telehealth: Payer: Self-pay | Admitting: Cardiology

## 2023-07-20 NOTE — Telephone Encounter (Signed)
 Pt wanted to make you aware she is pregnant in case you need to change he medications. Please advise

## 2023-07-20 NOTE — Telephone Encounter (Signed)
 Called and spoke to pt. She has been seen by her OB for current pregnancy. She last saw Dr.Tobb 12/2022 and is asking for a f/u appt. She is now [redacted]wks along with current pregnancy. Pt states a Nurse came out to see her today and her BP was elevated at 151/92. She also needs to see [place] SQUIBB (Pharm D) but needs appt with Tobb, MD first she states.

## 2023-07-21 DIAGNOSIS — Z419 Encounter for procedure for purposes other than remedying health state, unspecified: Secondary | ICD-10-CM | POA: Diagnosis not present

## 2023-07-24 ENCOUNTER — Encounter: Payer: Self-pay | Admitting: *Deleted

## 2023-07-25 ENCOUNTER — Other Ambulatory Visit (HOSPITAL_COMMUNITY)
Admission: RE | Admit: 2023-07-25 | Discharge: 2023-07-25 | Disposition: A | Discharge status: Home / Self Care | Source: Ambulatory Visit | Attending: Obstetrics and Gynecology | Admitting: Obstetrics and Gynecology

## 2023-07-25 ENCOUNTER — Ambulatory Visit: Attending: Cardiology | Admitting: Cardiology

## 2023-07-25 ENCOUNTER — Encounter: Payer: Self-pay | Admitting: Obstetrics and Gynecology

## 2023-07-25 ENCOUNTER — Ambulatory Visit: Admitting: Obstetrics and Gynecology

## 2023-07-25 ENCOUNTER — Other Ambulatory Visit (HOSPITAL_COMMUNITY): Payer: Self-pay

## 2023-07-25 ENCOUNTER — Encounter: Payer: Self-pay | Admitting: Family

## 2023-07-25 ENCOUNTER — Encounter: Payer: Self-pay | Admitting: Cardiology

## 2023-07-25 VITALS — BP 128/80 | HR 94 | Wt 185.0 lb

## 2023-07-25 VITALS — BP 142/94 | HR 88 | Ht 64.0 in | Wt 185.2 lb

## 2023-07-25 DIAGNOSIS — I517 Cardiomegaly: Secondary | ICD-10-CM

## 2023-07-25 DIAGNOSIS — I471 Supraventricular tachycardia, unspecified: Secondary | ICD-10-CM | POA: Diagnosis not present

## 2023-07-25 DIAGNOSIS — I10 Essential (primary) hypertension: Secondary | ICD-10-CM

## 2023-07-25 DIAGNOSIS — Z8632 Personal history of gestational diabetes: Secondary | ICD-10-CM

## 2023-07-25 DIAGNOSIS — Z3A12 12 weeks gestation of pregnancy: Secondary | ICD-10-CM

## 2023-07-25 DIAGNOSIS — Z1332 Encounter for screening for maternal depression: Secondary | ICD-10-CM

## 2023-07-25 DIAGNOSIS — E781 Pure hyperglyceridemia: Secondary | ICD-10-CM

## 2023-07-25 DIAGNOSIS — N051 Unspecified nephritic syndrome with focal and segmental glomerular lesions: Secondary | ICD-10-CM

## 2023-07-25 DIAGNOSIS — O10919 Unspecified pre-existing hypertension complicating pregnancy, unspecified trimester: Secondary | ICD-10-CM | POA: Diagnosis not present

## 2023-07-25 DIAGNOSIS — N182 Chronic kidney disease, stage 2 (mild): Secondary | ICD-10-CM

## 2023-07-25 DIAGNOSIS — Z3A13 13 weeks gestation of pregnancy: Secondary | ICD-10-CM

## 2023-07-25 DIAGNOSIS — O0990 Supervision of high risk pregnancy, unspecified, unspecified trimester: Secondary | ICD-10-CM | POA: Diagnosis present

## 2023-07-25 DIAGNOSIS — G40909 Epilepsy, unspecified, not intractable, without status epilepticus: Secondary | ICD-10-CM

## 2023-07-25 DIAGNOSIS — D509 Iron deficiency anemia, unspecified: Secondary | ICD-10-CM

## 2023-07-25 DIAGNOSIS — O09899 Supervision of other high risk pregnancies, unspecified trimester: Secondary | ICD-10-CM

## 2023-07-25 MED ORDER — METOCLOPRAMIDE HCL 10 MG PO TABS: 10.0000 mg | ORAL_TABLET | Freq: Four times a day (QID) | ORAL | 2 refills | Status: AC | PRN

## 2023-07-25 MED ORDER — BLOOD PRESSURE MONITOR AUTOMAT DEVI
1.0000 [IU] | Freq: Once | 0 refills | Status: AC
  Filled 2023-07-25: qty 1, 30d supply, fill #0

## 2023-07-25 MED ORDER — VITAMIN B-6 100 MG PO TABS: 100.0000 mg | ORAL_TABLET | Freq: Two times a day (BID) | ORAL | 11 refills | Status: DC

## 2023-07-25 MED ORDER — FOLIC ACID 1 MG PO TABS: 3.0000 mg | ORAL_TABLET | Freq: Every day | ORAL | 3 refills | Status: DC

## 2023-07-25 MED ORDER — ASPIRIN 81 MG PO TBEC: 162.0000 mg | DELAYED_RELEASE_TABLET | Freq: Every day | ORAL | 11 refills | Status: DC

## 2023-07-25 MED ORDER — LABETALOL HCL 200 MG PO TABS: 200.0000 mg | ORAL_TABLET | Freq: Three times a day (TID) | ORAL | 3 refills | Status: DC

## 2023-07-25 MED ORDER — LABETALOL HCL 200 MG PO TABS
400.0000 mg | ORAL_TABLET | Freq: Three times a day (TID) | ORAL | 1 refills | Status: DC
  Filled 2023-07-25 (×2): qty 270, 45d supply, fill #0

## 2023-07-25 MED ORDER — NIFEDIPINE ER 60 MG PO TB24: 60.0000 mg | ORAL_TABLET | Freq: Two times a day (BID) | ORAL | 3 refills | Status: DC

## 2023-07-25 NOTE — Progress Notes (Signed)
 Subjective:   Selena Meyer is a 38 y.o. G2P0101 at [redacted]w[redacted]d by LMP c/w 11w US  being seen today for her first obstetrical visit.  Her obstetrical history is significant for cHTN with history of SIPE w/ SF & LVH, FSGS/CKD, new epilepsy on trileptal , SVT, GDM last pregnancy, short interval pregnancy . Patient does intend to breast feed. Pregnancy history fully reviewed.  Patient reports persistent nausea.  HISTORY: OB History  Gravida Para Term Preterm AB Living  2 1 0 1 0 1  SAB IAB Ectopic Multiple Live Births  0 0 0 0 1    # Outcome Date GA Lbr Len/2nd Weight Sex Type Anes PTL Lv  2 Current           1 Preterm 09/29/22 [redacted]w[redacted]d 09:17 / 00:58 4 lb 9.4 oz (2.08 kg) F Vag-Spont EPI  LIV     Name: Selena Meyer     Apgar1: 6  Apgar5: 8    Last pap smear: Lab Results  Component Value Date   DIAGPAP  04/03/2022    - Negative for intraepithelial lesion or malignancy (NILM)   HPV NOT DETECTED 09/04/2017   HPVHIGH Negative 04/03/2022    Past Medical History:  Diagnosis Date   Anemia    Elevated cholesterol with high triglycerides 04/27/2020   Episodic tension-type headache, not intractable 08/06/2017   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    Gestational diabetes    Hypertension    Hypokalemia 11/02/2020   Irregular menstrual cycle    Irregular periods/menstrual cycles 05/03/2017   Will hold off on Provera or other fertility stuff for now, pt advised pregnancy is not recommended until BP under better control at least, plus other workup pen   Polycystic ovaries 11/09/2015   Proteinuria 02/28/2013   Stage 2 chronic kidney disease 04/27/2020   SVT (supraventricular tachycardia) (HCC) 11/01/2020   Systolic murmur 07/16/2017   Tobacco abuse 02/28/2013   Uncontrolled hypertension 05/10/2017   Past Surgical History:  Procedure Laterality  Date   NO PAST SURGERIES     Family History  Problem Relation Age of Onset   Diabetes Mother    High blood pressure Mother    Renal Disease Mother    Diabetes Sister    Cancer Maternal Grandmother    Diabetes Maternal Grandmother    High blood pressure Maternal Grandmother    Cancer Maternal Aunt        breast   Other Father    Social History   Tobacco Use   Smoking status: Former    Current packs/day: 0.00    Average packs/day: 0.3 packs/day for 5.0 years (1.3 ttl pk-yrs)    Types: Cigarettes    Start date: 05/16/2016    Quit date: 05/16/2021    Years since quitting: 2.2   Smokeless tobacco: Never   Tobacco comments:    Quit smoking May 31, 2023after her mom passed away  Vaping Use   Vaping status: Never Used  Substance Use Topics   Alcohol use: Not Currently    Comment: occasional use; social drinker   Drug use: No   Allergies  Allergen Reactions   Ibuprofen Hives   Current Outpatient Medications on File Prior to Visit  Medication Sig Dispense Refill   acetaminophen  (TYLENOL ) 500 MG tablet Take 500 mg by mouth as needed.     albuterol  (VENTOLIN  HFA)  108 (90 Base) MCG/ACT inhaler Inhale 2 puffs into the lungs every 6 (six) hours as needed for wheezing or shortness of breath. 1 each 1   ferrous sulfate  325 (65 FE) MG tablet Take 1 tablet (325 mg total) by mouth every other day. 100 tablet 3   ipratropium (ATROVENT ) 0.06 % nasal spray Place 2 sprays into both nostrils 4 (four) times daily. As needed for runny nose / postnasal drip 15 mL 1   Oxcarbazepine  (TRILEPTAL ) 300 MG tablet Take 1 tablet (300 mg total) by mouth 2 (two) times daily. 60 tablet 11   Prenatal Vit-Fe Fumarate-FA (MULTIVITAMIN-PRENATAL) 27-0.8 MG TABS tablet Take 1 tablet by mouth daily at 12 noon.     Midazolam  (NAYZILAM ) 5 MG/0.1ML SOLN Place 5 mg into the nose as needed (For seizure cluster or seizure lasting more than 5 minutes). 2 each 0   No current facility-administered medications on file prior to  visit.   Exam   Vitals:   07/25/23 1306 07/25/23 1318  BP: (!) 143/89 128/80  Pulse: (!) 105 94  Weight: 185 lb (83.9 kg)    Fetal Heart Rate (bpm):  (unable to doppler, provider notified) Bedside ultrasound with FHR 140s General:  Alert, oriented and cooperative. Patient is in no acute distress.  Breast:  deferred  Cardiovascular: Normal heart rate noted  Respiratory: Normal respiratory effort, no problems with respiration noted   Assessment:   Pregnancy: G2P0101 Patient Active Problem List   Diagnosis Date Noted   Nonintractable epilepsy without status epilepticus (HCC) 07/28/2023   Supervision of high risk pregnancy, antepartum 07/25/2023   Moderate left ventricular hypertrophy 05/19/2022   Chronic hypertension 05/09/2022   History of diet controlled gestational diabetes mellitus (GDM) 04/27/2022   Iron deficiency anemia 12/16/2021   SVT (supraventricular tachycardia) (HCC) 11/01/2020   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 Supervision of high risk pregnancy, antepartum (Primary) 2. [redacted] weeks gestation of pregnancy Initial labs drawn. Continue prenatal vitamins. B6/unisom & reglan  ordered for nausea and vomiting of pregnancy Genetic Screening discussed: NIPS ordered. Normal carrier screening last pregnancy Ultrasound discussed; fetal anatomic survey: ordered. Problem list reviewed and updated. The nature of [facility] with multiple MDs and other Advanced Practice Providers was explained to patient; also emphasized that residents, students are part of our team. Routine obstetric precautions reviewed. - CBC/D/Plt+RPR+Rh+ABO+RubIgG... - Culture, OB Urine - Cervicovaginal ancillary only - Hemoglobin A1c - PANORAMA PRENATAL TEST - US  MFM OB DETAIL +14 WK; Future - Comp Met (CMET) - Protein / creatinine ratio, urine - Ferritin - Iron Binding Cap (TIBC)([facility])  3. Chronic hypertension Saw Dr.  Sheena this morning. Stopped enalapril , restarted labetalol  200 TID & nifedipine  60 Baseline labs ordered ldASA (162mg ) recommended, rx sent Next cardiology appt 8/1 - Comp Met (CMET) - Protein / creatinine ratio, urine - Consult to Maternal Fetal Care  4. FSGS (focal segmental glomerulosclerosis) Baseline Cr, PC ratio ordered Next nephrology appointment scheduled 9/22 - recommended she calls to see if a sooner appointment is needed - Consult to Maternal Fetal Care  5. Iron deficiency anemia, unspecified iron deficiency anemia type Follows with heme, next appt scheduled 8/11 - Ferritin - Iron Binding Cap (TIBC)([facility])  6. SVT (supraventricular tachycardia) (HCC) 7. Moderate left ventricular hypertrophy Asymptomatic, on labetalol   Last echo 08/01/17 F/w Dr. Sheena, next appt 9/4  8. History of diet controlled gestational diabetes mellitus (GDM) F/u A1c, discussed role for early 2h  9. Nonintractable epilepsy without  status epilepticus, unspecified epilepsy type [facility]) - Had seizure 3 months postpartum (see ED note 12/10/22). EEG with frequent left frontotemporal spikes - See neuro note 12/3 - met criteria for epilepsy and was started on trileptal  (oxycarbazepine) 300mg  BID with intranasal diazepam  if breakthrough seizures occurred - Repeat MRI brain 01/30/23 unremarkable - No further seizures, continues on trileptal  - Discussed folate supplementation - mixed guidance for 0.4 vs 0.8 vs 1 vs 1-4mg  based on my review. Current prenatal has 0.8mg  folic acid . Will supplement additional 3mg  for now to get to the 2-4mg  range though question utility as neural tube has formed at this point - Next neuro appt 9/29 - will route message to physician to see if earlier appointment is possible to discuss management of anti seizure medications, folate, etc. From my review, looks like trileptal  may need enhanced monitoring during pregnancy - Consult to Maternal Fetal Care  Return in about 4 weeks  (around 08/22/2023) for return OB at 16 weeks.  Kieth Carolin, MD Obstetrician & Gynecologist, [facility] for [employer], [facility]

## 2023-07-25 NOTE — Patient Instructions (Addendum)
 Medication Instructions:  Your physician has recommended you make the following change in your medication:  INCREASE: Labetalol  200 mg three times per day.  *If you need a refill on your cardiac medications before your next appointment, please call your pharmacy*  Follow-Up: At [facility], you and your health needs are our priority.  As part of our continuing mission to provide you with exceptional heart care, our providers are all part of one team.  This team includes your primary Cardiologist (physician) and Advanced Practice Providers or APPs (Physician Assistants and Nurse Practitioners) who all work together to provide you with the care you need, when you need it.  Your next appointment:   6 week(s)  Provider:   Kardie Tobb, DO    Other instructions: Please see [place] SQUIBB in 2 weeks.

## 2023-07-26 LAB — CERVICOVAGINAL ANCILLARY ONLY
Chlamydia: NEGATIVE
Comment: NEGATIVE
Comment: NORMAL
Neisseria Gonorrhea: NEGATIVE

## 2023-07-27 LAB — CULTURE, OB URINE

## 2023-07-27 LAB — URINE CULTURE, OB REFLEX

## 2023-07-28 ENCOUNTER — Ambulatory Visit: Payer: Self-pay | Admitting: Obstetrics and Gynecology

## 2023-07-28 DIAGNOSIS — I10 Essential (primary) hypertension: Secondary | ICD-10-CM

## 2023-07-28 DIAGNOSIS — O0990 Supervision of high risk pregnancy, unspecified, unspecified trimester: Secondary | ICD-10-CM

## 2023-07-28 DIAGNOSIS — D509 Iron deficiency anemia, unspecified: Secondary | ICD-10-CM

## 2023-07-28 DIAGNOSIS — G40909 Epilepsy, unspecified, not intractable, without status epilepticus: Secondary | ICD-10-CM | POA: Insufficient documentation

## 2023-07-28 DIAGNOSIS — Z8632 Personal history of gestational diabetes: Secondary | ICD-10-CM

## 2023-07-28 NOTE — Progress Notes (Signed)
 Cardio-Obstetrics Clinic  Follow Up Note   Date:  07/28/2023   ID:  Selena Meyer, DOB 10-14-1985, MRN [id number]  PCP:  Willo Mini, NP   [facility] Providers Cardiologist:  Marquis Diles, DO  Electrophysiologist:  None        Referring MD: Willo Mini, NP   Chief Complaint:  I am doing well  History of Present Illness:    Selena Meyer is a 38 y.o. female [G2P0101] who returns for follow up for postpartum cardiovascular care.    Medical includes SVT, Hypertension, hypertriglyceridemia, FSGS, gestational diabetes and seizures.   She is here with her daughter and is currently [redacted] weeks pregnant.   She is [redacted] weeks pregnant and has recently switched from enalapril  to labetalol  three times a day due to pregnancy. Her blood pressure is currently 142/94 mmHg. Her previous pregnancy was complicated by severe preeclampsia, leading to delivery at 36 weeks and 2 days. She is concerned about the recurrence of preeclampsia and anticipates an earlier delivery than the typical 40 weeks, similar to her previous experience. No respiratory symptoms are present.   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    Gestational diabetes    Hypertension    Hypokalemia 11/02/2020   Irregular menstrual cycle    Irregular periods/menstrual cycles 05/03/2017   Will hold off on Provera or other fertility stuff for now, pt advised pregnancy is not recommended until BP under better control at least, plus other workup pen   Polycystic ovaries 11/09/2015   Proteinuria 02/28/2013   Stage 2 chronic kidney disease  04/27/2020   SVT (supraventricular tachycardia) (HCC) 11/01/2020   Systolic murmur 07/16/2017   Tobacco abuse 02/28/2013   Uncontrolled hypertension 05/10/2017    Past Surgical History:  Procedure Laterality Date   NO PAST SURGERIES        OB History     Gravida  2   Para  1   Term      Preterm  1   AB      Living  1      SAB      IAB      Ectopic      Multiple  0   Live Births  1               Current Medications: Current Meds  Medication Sig   acetaminophen  (TYLENOL ) 500 MG tablet Take 500 mg by mouth as needed.   albuterol  (VENTOLIN  HFA) 108 (90 Base) MCG/ACT inhaler Inhale 2 puffs into the lungs every 6 (six) hours as needed for wheezing or shortness of breath.   [EXPIRED] Blood Pressure Monitoring (BLOOD PRESSURE MONITOR AUTOMAT) DEVI Use to measure blood pressure as directed by doctor.   ferrous sulfate  325 (65 FE) MG tablet Take 1 tablet (325 mg total) by mouth every other day.   ipratropium (ATROVENT ) 0.06 % nasal spray Place 2 sprays into both nostrils 4 (four) times daily. As needed for runny nose / postnasal drip   Midazolam  (NAYZILAM ) 5 MG/0.1ML SOLN Place 5 mg into the nose as needed (For seizure cluster or seizure lasting more than 5 minutes).  Oxcarbazepine  (TRILEPTAL ) 300 MG tablet Take 1 tablet (300 mg total) by mouth 2 (two) times daily.   Prenatal Vit-Fe Fumarate-FA (MULTIVITAMIN-PRENATAL) 27-0.8 MG TABS tablet Take 1 tablet by mouth daily at 12 noon.   [DISCONTINUED] labetalol  (NORMODYNE ) 200 MG tablet Take 2 tablets (400 mg total) by mouth 2 (two) times daily.   [DISCONTINUED] NIFEdipine  (ADALAT  CC) 60 MG 24 hr tablet Take 1 tablet (60 mg total) by mouth 2 (two) times daily.   [DISCONTINUED] promethazine  (PHENERGAN ) 25 MG tablet Take 1 tablet (25 mg total) by mouth every 6 (six) hours as needed for nausea or vomiting.     Allergies:   Ibuprofen   Social History   Socioeconomic History   Marital status: Married    Spouse name:  Marsha   Number of children: 0   Years of education: Not on file   Highest education level: Not on file  Occupational History    Employer: UNEMPLOYED  Tobacco Use   Smoking status: Former    Current packs/day: 0.00    Average packs/day: 0.3 packs/day for 5.0 years (1.3 ttl pk-yrs)    Types: Cigarettes    Start date: 05/16/2016    Quit date: 05/16/2021    Years since quitting: 2.2   Smokeless tobacco: Never   Tobacco comments:    Quit smoking 06-08-2023after her mom passed away  Vaping Use   Vaping status: Never Used  Substance and Sexual Activity   Alcohol use: Not Currently    Comment: occasional use; social drinker   Drug use: No   Sexual activity: Yes    Partners: Male    Birth control/protection: None    Comment: Pregnant  Other Topics Concern   Not on file  Social History Narrative   Not on file   Social Drivers of Health   Financial Resource Strain: Low Risk  (05/08/2022)   Received from [facility]   Overall Financial Resource Strain (CARDIA)    Difficulty of Paying Living Expenses: Not very hard  Food Insecurity: No Food Insecurity (09/28/2022)   Hunger Vital Sign    Worried About Running Out of Food in the Last Year: Never true    Ran Out of Food in the Last Year: Never true  Transportation Needs: No Transportation Needs (09/28/2022)   PRAPARE - Administrator, Civil Service (Medical): No    Lack of Transportation (Non-Medical): No  Physical Activity: Unknown (11/03/2020)   Received from [facility]   Exercise Vital Sign    On average, how many days per week do you engage in moderate to strenuous exercise (like a brisk walk)?: 5 days    On average, how many minutes do you engage in exercise at this level?: Patient declined  Stress: Stress Concern Present (11/03/2020)   Received from [facility] Occupational [facility] - Occupational Stress Questionnaire    Feeling of Stress : To some extent  Social Connections: Unknown  (05/10/2021)   Received from [facility]   Social Network    Social Network: Not on file      Family History  Problem Relation Age of Onset   Diabetes Mother    High blood pressure Mother    Renal Disease Mother    Diabetes Sister    Cancer Maternal Grandmother    Diabetes Maternal Grandmother    High blood pressure Maternal Grandmother    Cancer Maternal Aunt        breast  Other Father       ROS:   Please see the history of present illness.     All other systems reviewed and are negative.   Labs/EKG Reviewed:    EKG:   EKG is was not ordered today.    Recent Labs: 08/03/2022: TSH 1.010 12/10/2022: ALT 25; Magnesium  1.9 03/22/2023: BUN 17; Creatinine, Ser 1.46; Potassium 4.2; Sodium 141 05/22/2023: Hemoglobin 12.2; Platelets 351   Recent Lipid Panel Lab Results  Component Value Date/Time   CHOL 240 (H) 11/15/2021 12:00 AM   TRIG 492 (H) 11/15/2021 12:00 AM   HDL 40 (L) 11/15/2021 12:00 AM   CHOLHDL 6.0 (H) 11/15/2021 12:00 AM   LDLCALC  11/15/2021 12:00 AM     Comment:     . LDL cholesterol not calculated. Triglyceride levels greater than 400 mg/dL invalidate calculated LDL results. . Reference range: <100 . Desirable range <100 mg/dL for primary prevention;   <70 mg/dL for patients with CHD or diabetic patients  with > or = 2 CHD risk factors. SABRA LDL-C is now calculated using the Martin-Hopkins  calculation, which is a validated novel method providing  better accuracy than the Friedewald equation in the  estimation of LDL-C.  Gladis APPLETHWAITE et al. SANDREA. 7986;689(80): 2061-2068  (http://education.[facility].com/faq/FAQ164)     Physical Exam:    VS:  BP (!) 142/94   Pulse 88   Ht 5' 4 (1.626 m)   Wt 185 lb 3.2 oz (84 kg)   LMP 04/27/2023 (Exact Date)   SpO2 98%   BMI 31.79 kg/m     Wt Readings from Last 3 Encounters:  07/25/23 185 lb (83.9 kg)  07/25/23 185 lb 3.2 oz (84 kg)  06/05/23 187 lb (84.8 kg)     GEN:  Well nourished, well  developed in no acute distress HEENT: Normal NECK: No JVD; No carotid bruits LYMPHATICS: No lymphadenopathy CARDIAC: RRR, no murmurs, rubs, gallops RESPIRATORY:  Clear to auscultation without rales, wheezing or rhonchi  ABDOMEN: Soft, non-tender, non-distended MUSCULOSKELETAL:  No edema; No deformity  SKIN: Warm and dry NEUROLOGIC:  Alert and oriented x 3 PSYCHIATRIC:  Normal affect    Risk Assessment/Risk Calculators:                  ASSESSMENT & PLAN:    Chronic hypertension in pregnancy FSGC Seizure Disorder CKD Hx of gestational diabetes  Enalapril  unsuitable for pregnancy agree with this being stopped prior to this visit.  Blood pressure 142/94 mmHg at [redacted] weeks gestation. I know we will need close follow up for her blood pressure she did have resistant  difficult to control blood pressure during her recent pregnancy.  - Adjust labetalol  dosage to three times a day. - Schedule follow-up in two weeks to assess medication adjustment. - will defer to our OBGYN and MFM for plan delivery around 36-38 weeks due to history of severe preeclampsia. -Aspirin  started for preeclampsia prophylaxis    She needs a close follow-up.  Will see the patient in 6 weeks. She will see our cardioOb pharmacist prior to this.   Patient Instructions  Medication Instructions:  Your physician has recommended you make the following change in your medication:  INCREASE: Labetalol  200 mg three times per day.  *If you need a refill on your cardiac medications before your next appointment, please call your pharmacy*  Follow-Up: At [facility], you and your health needs are our priority.  As part of our continuing mission to provide you with exceptional  heart care, our providers are all part of one team.  This team includes your primary Cardiologist (physician) and Advanced Practice Providers or APPs (Physician Assistants and Nurse Practitioners) who all work together to provide you with  the care you need, when you need it.  Your next appointment:   6 week(s)  Provider:   Notnamed Scholz, DO    Other instructions: Please see Medford SQUIBB in 2 weeks.     Dispo:  No follow-ups on file.   Medication Adjustments/Labs and Tests Ordered: Current medicines are reviewed at length with the patient today.  Concerns regarding medicines are outlined above.  Tests Ordered: Orders Placed This Encounter  Procedures   EKG 12-Lead   Medication Changes: Meds ordered this encounter  Medications   DISCONTD: labetalol  (NORMODYNE ) 200 MG tablet    Sig: Take 2 tablets (400 mg total) by mouth 3 (three) times daily.    Dispense:  270 tablet    Refill:  1   Blood Pressure Monitoring (BLOOD PRESSURE MONITOR AUTOMAT) DEVI    Sig: Use to measure blood pressure as directed by doctor.    Dispense:  1 each    Refill:  0

## 2023-07-30 ENCOUNTER — Telehealth: Payer: Self-pay

## 2023-07-30 ENCOUNTER — Other Ambulatory Visit: Payer: Self-pay | Admitting: Neurology

## 2023-07-30 DIAGNOSIS — G40109 Localization-related (focal) (partial) symptomatic epilepsy and epileptic syndromes with simple partial seizures, not intractable, without status epilepticus: Secondary | ICD-10-CM

## 2023-07-30 DIAGNOSIS — Z5181 Encounter for therapeutic drug level monitoring: Secondary | ICD-10-CM

## 2023-07-30 NOTE — Telephone Encounter (Signed)
 Call to patient, advised lab work is needed and she is agreeable to come this week. MyChart message sent with Lab hours.

## 2023-08-05 LAB — PANORAMA PRENATAL TEST FULL PANEL:PANORAMA TEST PLUS 5 ADDITIONAL MICRODELETIONS: FETAL FRACTION: 5.6

## 2023-08-07 NOTE — Telephone Encounter (Signed)
 Spoke with pt after this message.

## 2023-08-10 ENCOUNTER — Ambulatory Visit: Attending: Cardiology | Admitting: Pharmacist

## 2023-08-10 VITALS — BP 145/85 | HR 98

## 2023-08-10 DIAGNOSIS — Z3A15 15 weeks gestation of pregnancy: Secondary | ICD-10-CM

## 2023-08-10 DIAGNOSIS — O10919 Unspecified pre-existing hypertension complicating pregnancy, unspecified trimester: Secondary | ICD-10-CM

## 2023-08-10 DIAGNOSIS — I10 Essential (primary) hypertension: Secondary | ICD-10-CM

## 2023-08-10 DIAGNOSIS — I129 Hypertensive chronic kidney disease with stage 1 through stage 4 chronic kidney disease, or unspecified chronic kidney disease: Secondary | ICD-10-CM | POA: Insufficient documentation

## 2023-08-10 DIAGNOSIS — R809 Proteinuria, unspecified: Secondary | ICD-10-CM | POA: Insufficient documentation

## 2023-08-10 MED ORDER — NIFEDIPINE ER 60 MG PO TB24: ORAL_TABLET | ORAL | Status: DC

## 2023-08-10 MED ORDER — LABETALOL HCL 200 MG PO TABS: 400.0000 mg | ORAL_TABLET | Freq: Three times a day (TID) | ORAL | Status: DC

## 2023-08-10 NOTE — Progress Notes (Signed)
 Patient ID: TEASIA ZAPF                 DOB: 02-08-1985                      MRN: [id number]     HPI: Selena Meyer is a 38 y.o. female referred to Cardio-OB clinic for high risk pregnancy. PMH is significant for FSGS, proteinuria, GDM, HTN and epilepsy.  Patient currently 15w 0d pregnant.   Seen by Dr Sheena on 7/16 and was hypertensive in room. Enalapril  was switched to labetalol  400mg  TID and she has remained on nifedipine  60mg  BID. Has an alarm set to remind her to take medications.  First trimester complicated so far by nausea/vomiting. Now managed on metoclopramide  and B6.    Has lab orders pending for OB and hematology. She plans to have these drawn on Monday 8/4.   She is not currently breastfeeding.  Current HTN meds:  Labetalol  200mg  TID Nifedipine  60mg  BID   Wt Readings from Last 3 Encounters:  07/25/23 185 lb (83.9 kg)  07/25/23 185 lb 3.2 oz (84 kg)  06/05/23 187 lb (84.8 kg)   BP Readings from Last 3 Encounters:  07/25/23 128/80  07/25/23 (!) 142/94  06/05/23 129/84   Pulse Readings from Last 3 Encounters:  07/25/23 94  07/25/23 88  06/05/23 87    Renal function: CrCl cannot be calculated (Patient's most recent lab result is older than the maximum 21 days allowed.).  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    Gestational diabetes    Hypertension    Hypokalemia 11/02/2020   Irregular menstrual cycle    Irregular periods/menstrual cycles 05/03/2017   Will hold off on Provera or other fertility stuff for now, pt advised pregnancy is not recommended until BP under better control at least, plus other workup pen   Polycystic ovaries 11/09/2015   Proteinuria  02/28/2013   Stage 2 chronic kidney disease 04/27/2020   SVT (supraventricular tachycardia) (HCC) 11/01/2020   Systolic murmur 07/16/2017   Tobacco abuse 02/28/2013   Uncontrolled hypertension 05/10/2017    Current Outpatient Medications on File Prior to Visit  Medication Sig Dispense Refill   acetaminophen  (TYLENOL ) 500 MG tablet Take 500 mg by mouth as needed.     albuterol  (VENTOLIN  HFA) 108 (90 Base) MCG/ACT inhaler Inhale 2 puffs into the lungs every 6 (six) hours as needed for wheezing or shortness of breath. 1 each 1   aspirin  EC 81 MG tablet Take 2 tablets (162 mg total) by mouth daily. Swallow whole. 60 tablet 11   ferrous sulfate  325 (65 FE) MG tablet Take 1 tablet (325 mg total) by mouth every other day. 100 tablet 3   folic acid  (FOLVITE ) 1 MG tablet Take 3 tablets (3 mg total) by mouth daily. 270 tablet 3   ipratropium (ATROVENT ) 0.06 % nasal spray Place 2 sprays into both nostrils 4 (four) times daily. As needed for runny nose / postnasal drip 15 mL 1   labetalol  (NORMODYNE ) 200 MG tablet Take 1 tablet (200 mg total) by mouth 3 (three) times daily. 270 tablet 3   metoCLOPramide  (REGLAN ) 10 MG tablet Take 1 tablet (10 mg total) by  mouth 4 (four) times daily as needed for nausea or vomiting. 30 tablet 2   Midazolam  (NAYZILAM ) 5 MG/0.1ML SOLN Place 5 mg into the nose as needed (For seizure cluster or seizure lasting more than 5 minutes). 2 each 0   NIFEdipine  (ADALAT  CC) 60 MG 24 hr tablet Take 1 tablet (60 mg total) by mouth 2 (two) times daily. 180 tablet 3   Oxcarbazepine  (TRILEPTAL ) 300 MG tablet Take 1 tablet (300 mg total) by mouth 2 (two) times daily. 60 tablet 11   Prenatal Vit-Fe Fumarate-FA (MULTIVITAMIN-PRENATAL) 27-0.8 MG TABS tablet Take 1 tablet by mouth daily at 12 noon.     pyridOXINE (VITAMIN B6) 100 MG tablet Take 1 tablet (100 mg total) by mouth in the morning and at bedtime. 60 tablet 11   No current facility-administered medications on file prior to visit.     Allergies  Allergen Reactions   Ibuprofen Hives     Assessment/Plan:  1. Hypertension in Pregnancy - Patient BP today 146/84 (right arm) and 145/85 (left arm). Took morning medications but has not taken midday labetalol  yet. Advised patient to take meds when she leaves here and will contact at end of the day for BP update.  Contacted patient. Checked BP twice 4:18pm 140/89. 5:00pm 139/88.  Will increase nifedipine  to 60mg  in morning, 90mg  in evening.  Continue labetalol  400mg  TID F/u in 4-6 weeks  Medford Bolk, PharmD, BCACP, CDCES, CPP [facility] [address], [place], [place] [address] Phone: [phone]; Fax: [phone] 08/10/2023 3:26 PM

## 2023-08-10 NOTE — Patient Instructions (Addendum)
 Good seeing you again  Try to update that lab work when you can  Continue your nifedipine  60mg  twice a day  Continue your labetalol  200mg  three times a day  Take your med when you get home and check your blood pressure about an hour or 2 later  I will call around 5pm to check it  Let us  know if you have any questions  Medford Bolk, PharmD, BCACP, CDCES, CPP [facility] [address], [place], [place] [address] Phone: [phone]; Fax: [phone] 08/10/2023 2:36 PM

## 2023-08-13 ENCOUNTER — Telehealth: Payer: Self-pay

## 2023-08-13 NOTE — Telephone Encounter (Signed)
 RN received Access Nurse message regarding patient reporting headache, 5/10 on pain scale, has improved some. Pt reported BP had been within normal limits. Per Access Nurse 117/65 and 101/66. Pt reported current BP 122/78, pulse 87.  Pt reported had not taken her 2nd dose of Labetalol  due to being asleep and BP running low. RN advised for patient to continue monitoring at home and also offered to schedule patient for in person BP check. Pt accepted. RN requested patient bring home BP cuff to appointment tomorrow.  [name], RN

## 2023-08-14 ENCOUNTER — Encounter: Admitting: Obstetrics and Gynecology

## 2023-08-14 ENCOUNTER — Ambulatory Visit (INDEPENDENT_AMBULATORY_CARE_PROVIDER_SITE_OTHER): Admitting: Obstetrics and Gynecology

## 2023-08-14 VITALS — BP 145/98 | HR 83 | Wt 188.0 lb

## 2023-08-14 DIAGNOSIS — O0990 Supervision of high risk pregnancy, unspecified, unspecified trimester: Secondary | ICD-10-CM

## 2023-08-14 DIAGNOSIS — I10 Essential (primary) hypertension: Secondary | ICD-10-CM

## 2023-08-14 DIAGNOSIS — O10919 Unspecified pre-existing hypertension complicating pregnancy, unspecified trimester: Secondary | ICD-10-CM

## 2023-08-14 NOTE — Progress Notes (Signed)
 Subjective:  Selena Meyer is a 38 y.o. female here for BP check. Pt is 15 weeks and 4 days pregnant.  Hypertension ROS: Patient denies any headaches, visual symptoms, RUQ/epigastric pain or other concerning symptoms.  Objective:  LMP 04/27/2023 (Exact Date)   Appearance alert, well appearing, and in no distress. General exam BP noted to be 144/83 and 134/84 today in office. Home BP cuff and monitor with reading 145/98.   Assessment:   Blood Pressure needs further observation and review from provider. Pt to complete original lab orders from 07/25/23. See below.  Plan:  Orders and follow up as documented in patient record. Reviewed with Delon Emms, NP. See provider note.   Selena LELON Piano, RN

## 2023-08-14 NOTE — Progress Notes (Signed)
   PRENATAL VISIT NOTE  Subjective:  Selena Meyer is a 38 y.o. G2P0101 at [redacted]w[redacted]d being seen today for ongoing prenatal care.  She is currently monitored for the following issues for this high-risk pregnancy and has FSGS (focal segmental glomerulosclerosis); SVT (supraventricular tachycardia) (HCC); Iron deficiency anemia; History of diet controlled gestational diabetes mellitus (GDM); Chronic hypertension; Moderate left ventricular hypertrophy; Supervision of high risk pregnancy, antepartum; Nonintractable epilepsy without status epilepticus (HCC); Hypertensive nephropathy; and Proteinuria on their problem list.  Patient reports no complaints.   .  .   . Denies leaking of fluid.   The following portions of the patient's history were reviewed and updated as appropriate: allergies, current medications, past family history, past medical history, past social history, past surgical history and problem list.   Objective:    Vitals:   08/14/23 1333 08/14/23 1339 08/14/23 1340  BP: (!) 144/83 134/84 (!) 145/98  Pulse: 86 85 83  Weight: 188 lb (85.3 kg)      Fetal Status:           General: Alert, oriented and cooperative. Patient is in no acute distress.  Skin: Skin is warm and dry. No rash noted.   Cardiovascular: Normal heart rate noted  Respiratory: Normal respiratory effort, no problems with respiration noted  Abdomen: Soft, gravid, appropriate for gestational age.        Pelvic: Cervical exam deferred        Extremities: Normal range of motion.     Mental Status: Normal mood and affect. Normal behavior. Normal judgment and thought content.   Assessment and Plan:  Pregnancy: G2P0101 at [redacted]w[redacted]d 1. Supervision of high risk pregnancy, antepartum (Primary)  -Protein / creatinine ratio, urine -No HA or vision changes -Reviewed preeclampsia S/S -Continue ASA 162 mg daily   2. Chronic hypertension  -Home cuff slightly higher than office BP readings  -Reports elevated Home BP  readings -Currently taking labetalol  400 mg TID and procardia  60 mg BID. Was instructed to increase PM procardia  to 90 mg at bedtime but did not do this due to concerns for low BP.  -Recommend she continue labetalol  400 mg TID and increae PM procardia  to 90 mg and continue 60 mg qAM.  - Protein / creatinine ratio, urine  3. Chronic hypertension during pregnancy, antepartum   Preterm labor symptoms and general obstetric precautions including but not limited to vaginal bleeding, contractions, leaking of fluid and fetal movement were reviewed in detail with the patient. Please refer to After Visit Summary for other counseling recommendations.   No follow-ups on file.  Future Appointments  Date Time Provider Department Center  08/14/2023  2:30 PM Jahir Halt, Delon FERNS, NP [facility]  08/20/2023 10:45 AM [facility] None  08/20/2023 11:00 AM Tonette Lauraine HERO, PA-C [facility] None  08/20/2023  2:10 PM Nicholaus Burnard HERO, MD [facility]  09/11/2023  7:00 AM [facility] PROVIDER 1 [facility]  09/11/2023  7:30 AM [facility] US4 [facility]  09/13/2023 11:20 AM Tobb, Dub, DO [facility]  09/24/2023  2:00 PM Darrell Bruckner, [facility]  10/08/2023  1:45 PM Gregg Lek, MD [facility] None    Delon Emms, NP

## 2023-08-15 LAB — CBC/D/PLT+RPR+RH+ABO+RUBIGG...
Antibody Screen: NEGATIVE
Basophils Absolute: 0.1 x10E3/uL (ref 0.0–0.2)
Basos: 0 %
EOS (ABSOLUTE): 0.2 x10E3/uL (ref 0.0–0.4)
Eos: 1 %
HCV Ab: NONREACTIVE
HIV Screen 4th Generation wRfx: NONREACTIVE
Hematocrit: 35.2 % (ref 34.0–46.6)
Hemoglobin: 11.7 g/dL (ref 11.1–15.9)
Hepatitis B Surface Ag: NEGATIVE
Immature Grans (Abs): 0 x10E3/uL (ref 0.0–0.1)
Immature Granulocytes: 0 %
Lymphocytes Absolute: 1.7 x10E3/uL (ref 0.7–3.1)
Lymphs: 14 %
MCH: 31.1 pg (ref 26.6–33.0)
MCHC: 33.2 g/dL (ref 31.5–35.7)
MCV: 94 fL (ref 79–97)
Monocytes Absolute: 0.8 x10E3/uL (ref 0.1–0.9)
Monocytes: 7 %
Neutrophils Absolute: 9.3 x10E3/uL — ABNORMAL HIGH (ref 1.4–7.0)
Neutrophils: 78 %
Platelets: 378 x10E3/uL (ref 150–450)
RBC: 3.76 x10E6/uL — ABNORMAL LOW (ref 3.77–5.28)
RDW: 13.6 % (ref 11.7–15.4)
RPR Ser Ql: NONREACTIVE
Rh Factor: POSITIVE
Rubella Antibodies, IGG: 1.51 {index} (ref >0.99)
WBC: 12.1 x10E3/uL — ABNORMAL HIGH (ref 3.4–10.8)

## 2023-08-15 LAB — COMPREHENSIVE METABOLIC PANEL WITH GFR
ALT: 10 IU/L (ref 0–32)
AST: 12 IU/L (ref 0–40)
Albumin: 3.7 g/dL — ABNORMAL LOW (ref 3.9–4.9)
Alkaline Phosphatase: 67 IU/L (ref 44–121)
BUN/Creatinine Ratio: 12 (ref 9–23)
BUN: 10 mg/dL (ref 6–20)
Bilirubin Total: <0.2 mg/dL (ref 0.0–1.2)
CO2: 20 mmol/L (ref 20–29)
Calcium: 9.7 mg/dL (ref 8.7–10.2)
Chloride: 99 mmol/L (ref 96–106)
Creatinine, Ser: 0.82 mg/dL (ref 0.57–1.00)
Globulin, Total: 2.6 g/dL (ref 1.5–4.5)
Glucose: 84 mg/dL (ref 70–99)
Potassium: 4.1 mmol/L (ref 3.5–5.2)
Sodium: 135 mmol/L (ref 134–144)
Total Protein: 6.3 g/dL (ref 6.0–8.5)
eGFR: 93 mL/min/1.73 (ref >59)

## 2023-08-15 LAB — IRON AND TIBC
Iron Saturation: 25 % (ref 15–55)
Iron: 87 ug/dL (ref 27–159)
Total Iron Binding Capacity: 354 ug/dL (ref 250–450)
UIBC: 267 ug/dL (ref 131–425)

## 2023-08-15 LAB — PROTEIN / CREATININE RATIO, URINE
Creatinine, Urine: 245.4 mg/dL
Protein, Ur: 420.3 mg/dL
Protein/Creat Ratio: 1713 mg/g{creat} — ABNORMAL HIGH (ref 0–200)

## 2023-08-15 LAB — HCV INTERPRETATION

## 2023-08-15 LAB — HEMOGLOBIN A1C
Est. average glucose Bld gHb Est-mCnc: 120 mg/dL
Hgb A1c MFr Bld: 5.8 % — ABNORMAL HIGH (ref 4.8–5.6)

## 2023-08-15 LAB — FERRITIN: Ferritin: 45 ng/mL (ref 15–150)

## 2023-08-17 ENCOUNTER — Other Ambulatory Visit: Payer: Self-pay

## 2023-08-17 DIAGNOSIS — D500 Iron deficiency anemia secondary to blood loss (chronic): Secondary | ICD-10-CM

## 2023-08-20 ENCOUNTER — Other Ambulatory Visit (INDEPENDENT_AMBULATORY_CARE_PROVIDER_SITE_OTHER)

## 2023-08-20 ENCOUNTER — Other Ambulatory Visit: Payer: Self-pay | Admitting: Neurology

## 2023-08-20 ENCOUNTER — Inpatient Hospital Stay (HOSPITAL_BASED_OUTPATIENT_CLINIC_OR_DEPARTMENT_OTHER): Admitting: Medical Oncology

## 2023-08-20 ENCOUNTER — Ambulatory Visit: Admitting: Obstetrics and Gynecology

## 2023-08-20 ENCOUNTER — Encounter: Payer: Self-pay | Admitting: Family

## 2023-08-20 ENCOUNTER — Inpatient Hospital Stay: Attending: Medical Oncology

## 2023-08-20 VITALS — BP 119/79 | HR 90 | Wt 192.0 lb

## 2023-08-20 VITALS — BP 135/73

## 2023-08-20 DIAGNOSIS — D500 Iron deficiency anemia secondary to blood loss (chronic): Secondary | ICD-10-CM

## 2023-08-20 DIAGNOSIS — I129 Hypertensive chronic kidney disease with stage 1 through stage 4 chronic kidney disease, or unspecified chronic kidney disease: Secondary | ICD-10-CM

## 2023-08-20 DIAGNOSIS — O24919 Unspecified diabetes mellitus in pregnancy, unspecified trimester: Secondary | ICD-10-CM

## 2023-08-20 DIAGNOSIS — I10 Essential (primary) hypertension: Secondary | ICD-10-CM

## 2023-08-20 DIAGNOSIS — D509 Iron deficiency anemia, unspecified: Secondary | ICD-10-CM | POA: Insufficient documentation

## 2023-08-20 DIAGNOSIS — I471 Supraventricular tachycardia, unspecified: Secondary | ICD-10-CM

## 2023-08-20 DIAGNOSIS — O99012 Anemia complicating pregnancy, second trimester: Secondary | ICD-10-CM | POA: Insufficient documentation

## 2023-08-20 DIAGNOSIS — Z8632 Personal history of gestational diabetes: Secondary | ICD-10-CM

## 2023-08-20 DIAGNOSIS — R7309 Other abnormal glucose: Secondary | ICD-10-CM

## 2023-08-20 DIAGNOSIS — O10919 Unspecified pre-existing hypertension complicating pregnancy, unspecified trimester: Secondary | ICD-10-CM

## 2023-08-20 DIAGNOSIS — Z3A16 16 weeks gestation of pregnancy: Secondary | ICD-10-CM | POA: Insufficient documentation

## 2023-08-20 DIAGNOSIS — G40909 Epilepsy, unspecified, not intractable, without status epilepticus: Secondary | ICD-10-CM | POA: Diagnosis not present

## 2023-08-20 DIAGNOSIS — N051 Unspecified nephritic syndrome with focal and segmental glomerular lesions: Secondary | ICD-10-CM

## 2023-08-20 DIAGNOSIS — O10012 Pre-existing essential hypertension complicating pregnancy, second trimester: Secondary | ICD-10-CM

## 2023-08-20 DIAGNOSIS — O0990 Supervision of high risk pregnancy, unspecified, unspecified trimester: Secondary | ICD-10-CM

## 2023-08-20 DIAGNOSIS — I517 Cardiomegaly: Secondary | ICD-10-CM

## 2023-08-20 DIAGNOSIS — O24912 Unspecified diabetes mellitus in pregnancy, second trimester: Secondary | ICD-10-CM

## 2023-08-20 LAB — CBC (CANCER CENTER ONLY)
HCT: 31 % — ABNORMAL LOW (ref 36.0–46.0)
Hemoglobin: 10.7 g/dL — ABNORMAL LOW (ref 12.0–15.0)
MCH: 31.4 pg (ref 26.0–34.0)
MCHC: 34.5 g/dL (ref 30.0–36.0)
MCV: 90.9 fL (ref 80.0–100.0)
Platelet Count: 314 K/uL (ref 150–400)
RBC: 3.41 MIL/uL — ABNORMAL LOW (ref 3.87–5.11)
RDW: 13.3 % (ref 11.5–15.5)
WBC Count: 13 K/uL — ABNORMAL HIGH (ref 4.0–10.5)
nRBC: 0 % (ref 0.0–0.2)

## 2023-08-20 LAB — IRON AND IRON BINDING CAPACITY (CC-WL,HP ONLY)
Iron: 75 ug/dL (ref 28–170)
Saturation Ratios: 18 % (ref 10.4–31.8)
TIBC: 424 ug/dL (ref 250–450)
UIBC: 349 ug/dL

## 2023-08-20 LAB — FERRITIN: Ferritin: 40 ng/mL (ref 11–307)

## 2023-08-20 NOTE — Progress Notes (Signed)
 PRENATAL VISIT NOTE  Subjective:  Selena Meyer is a 38 y.o. G2P0101 at [redacted]w[redacted]d being seen today for ongoing prenatal care.  She is currently monitored for the following issues for this high-risk pregnancy and has FSGS (focal segmental glomerulosclerosis); SVT (supraventricular tachycardia) (HCC); Iron deficiency anemia; History of diet controlled gestational diabetes mellitus (GDM); Chronic hypertension; Moderate left ventricular hypertrophy; Supervision of high risk pregnancy, antepartum; Nonintractable epilepsy without status epilepticus (HCC); Hypertensive nephropathy; Proteinuria; and Diabetes in pregnancy on their problem list.  Patient reports no complaints.  Contractions: Not present. Vag. Bleeding: None.  Movement: Absent. Denies leaking of fluid.   The following portions of the patient's history were reviewed and updated as appropriate: allergies, current medications, past family history, past medical history, past social history, past surgical history and problem list.   Objective:    Vitals:   08/20/23 1423  BP: 119/79  Pulse: 90  Weight: 192 lb (87.1 kg)    Fetal Status:  Fetal Heart Rate (bpm): 155   Movement: Absent    General: Alert, oriented and cooperative. Patient is in no acute distress.  Skin: Skin is warm and dry. No rash noted.   Cardiovascular: Normal heart rate noted  Respiratory: Normal respiratory effort, no problems with respiration noted  Abdomen: Soft, gravid, appropriate for gestational age.  Pain/Pressure: Absent     Pelvic: Cervical exam deferred        Extremities: Normal range of motion.  Edema: Trace  Mental Status: Normal mood and affect. Normal behavior. Normal judgment and thought content.   Assessment and Plan:  Pregnancy: G2P0101 at [redacted]w[redacted]d  2. SVT (supraventricular tachycardia) (HCC) Asymptomatic, no further episodes Cont labetalol  Last echo 08/01/17 F/w Dr. Sheena, next appt 9/4  3. Moderate left ventricular hypertrophy Last echo  2019 Is seeing Dr. Sheena  4. Nonintractable epilepsy without status epilepticus, unspecified epilepsy type (HCC) - Had seizure 3 months postpartum (see ED note 12/10/22). EEG with frequent left frontotemporal spikes - See neuro note 12/3 - met criteria for epilepsy and was started on trileptal  (oxycarbazepine) 300mg  BID with intranasal diazepam  if breakthrough seizures occurred - Repeat MRI brain 01/30/23 unremarkable - No further seizures, continues on trileptal  - has appt in October - is taking the extra folate as discussed 07/25/2023 - needs oxacarbazepine level, printed out slips today  5. FSGS (focal segmental glomerulosclerosis) Followed by nephrology, next visit september Elevated UPCr noted last visit  6. Hypertensive nephropathy  7. History of diet controlled gestational diabetes mellitus (GDM) Elevated A1c with NOB labs Completed 2 hr GTT today, pending  8. Iron deficiency anemia, unspecified iron deficiency anemia type Same heme this am - mildly anemic with normal iron studies, given history and 16 weeks, will order IV iron transfusion  9. Supervision of high risk pregnancy, antepartum  10. [redacted] weeks gestation of pregnancy  11. Chronic hypertension during pregnancy, antepartum Increased nifedipine  to 60 Q am and 90 Q pm last visit, cont labetalol  400 mg TID To f/u with pharmacy prior to next visit with Dr. Sheena   Preterm labor symptoms and general obstetric precautions including but not limited to vaginal bleeding, contractions, leaking of fluid and fetal movement were reviewed in detail with the patient. Please refer to After Visit Summary for other counseling recommendations.   Return in about 2 weeks (around 09/03/2023) for high OB.  Future Appointments  Date Time Provider Department Center  09/05/2023  4:10 PM Erik Kieth BROCKS, MD [facility]  09/11/2023  7:00 AM [facility] PROVIDER  1 [facility]  09/11/2023  7:30 AM [facility] US[facility]  09/13/2023 11:20  AM Tobb, Kardie, DO [facility]  09/24/2023  2:00 PM Darrell Bruckner, [facility]  10/08/2023  1:45 PM Gregg Lek, MD [facility] None  10/22/2023 10:45 AM [facility] None  10/22/2023 11:00 AM Covington, Lauraine HERO, PA-C [facility] None    Burnard HERO Moats, MD

## 2023-08-20 NOTE — Progress Notes (Signed)
 Hematology and Oncology Follow Up Visit  Selena Meyer [id number] Nov 21, 1985 38 y.o. 08/20/2023   Principle Diagnosis:  Iron deficiency anemia secondary to heavy cycles     Current Therapy:        IV iron as indicated - feraheme- last given on 01/26/2023 Oral iron- every other day    Interim History:  Selena Meyer is here today for follow-up.  Today she states that she has been doing ok. She is currently 16 weeks. She is having a boy. She is seen by [facility] for [facility] She has an appointment with them today.  No fever, chills, n/v, cough, rash, dizziness, SOB, chest pain, palpitations, abdominal pain or changes in bowel or bladder habits.  Intermittent puffiness in her feet and ankles comes and goes.  There has been no bleeding to her knowledge: denies epistaxis, gingivitis, hemoptysis, hematemesis, hematuria, melena, excessive bruising, blood donation.  No falls or syncope reported.  Appetite and hydration are good. Wt Readings from Last 3 Encounters:  08/20/23 (P) 188 lb (85.3 kg)  08/14/23 188 lb (85.3 kg)  07/25/23 185 lb (83.9 kg)   ECOG Performance Status: 1 - Symptomatic but completely ambulatory  Medications:  Allergies as of 08/20/2023       Reactions   Ibuprofen Hives        Medication List        Accurate as of August 20, 2023 11:27 AM. If you have any questions, ask your nurse or doctor.          acetaminophen  500 MG tablet Commonly known as: TYLENOL  Take 500 mg by mouth as needed.   albuterol  108 (90 Base) MCG/ACT inhaler Commonly known as: VENTOLIN  HFA Inhale 2 puffs into the lungs every 6 (six) hours as needed for wheezing or shortness of breath.   aspirin  EC 81 MG tablet Take 2 tablets (162 mg total) by mouth daily. Swallow whole.   FeroSul 325 (65 Fe) MG tablet Generic drug: ferrous sulfate  Take 1 tablet (325 mg total) by mouth every other day.   folic acid  1 MG tablet Commonly known as:  FOLVITE  Take 3 tablets (3 mg total) by mouth daily.   ipratropium 0.06 % nasal spray Commonly known as: ATROVENT  Place 2 sprays into both nostrils 4 (four) times daily. As needed for runny nose / postnasal drip   labetalol  200 MG tablet Commonly known as: NORMODYNE  Take 2 tablets (400 mg total) by mouth 3 (three) times daily.   metoCLOPramide  10 MG tablet Commonly known as: REGLAN  Take 1 tablet (10 mg total) by mouth 4 (four) times daily as needed for nausea or vomiting.   multivitamin-prenatal 27-0.8 MG Tabs tablet Take 1 tablet by mouth daily at 12 noon.   Nayzilam  5 MG/0.1ML Soln Generic drug: Midazolam  Place 5 mg into the nose as needed (For seizure cluster or seizure lasting more than 5 minutes).   NIFEdipine  60 MG 24 hr tablet Commonly known as: ADALAT  CC Take 60mg  in the morning and 90 mg in the evening (1 and 1/2 tablets) What changed: additional instructions   Oxcarbazepine  300 MG tablet Commonly known as: Trileptal  Take 1 tablet (300 mg total) by mouth 2 (two) times daily.   pyridOXINE 100 MG tablet Commonly known as: VITAMIN B6 Take 1 tablet (100 mg total) by mouth in the morning and at bedtime.        Allergies:  Allergies  Allergen Reactions   Ibuprofen Hives    Past Medical  History, Surgical history, Social history, and Family History were reviewed and updated.  Review of Systems: All other 10 point review of systems is negative.   Physical Exam:  height is 5' 3 (1.6 m) (pended) and weight is 188 lb (85.3 kg) (pended). Her oral temperature is 98.9 F (37.2 C) (pended). Her pulse is 85 (pended). Her respiration is 19 (pended) and oxygen saturation is 100% (pended).   Wt Readings from Last 3 Encounters:  08/20/23 (P) 188 lb (85.3 kg)  08/14/23 188 lb (85.3 kg)  07/25/23 185 lb (83.9 kg)    Ocular: Sclerae unicteric, pupils equal, round and reactive to light Ear-nose-throat: Oropharynx clear, dentition fair Lymphatic: No cervical or  supraclavicular adenopathy Lungs no rales or rhonchi, good excursion bilaterally Heart regular rate and rhythm, no murmur appreciated Abd soft, nontender, positive bowel sounds MSK no focal spinal tenderness, no joint edema Neuro: non-focal, well-oriented, appropriate affect   Lab Results  Component Value Date   WBC 13.0 (H) 08/20/2023   HGB 10.7 (L) 08/20/2023   HCT 31.0 (L) 08/20/2023   MCV 90.9 08/20/2023   PLT 314 08/20/2023   Lab Results  Component Value Date   FERRITIN 45 08/14/2023   IRON 87 08/14/2023   TIBC 354 08/14/2023   UIBC 267 08/14/2023   IRONPCTSAT 25 08/14/2023   Lab Results  Component Value Date   RETICCTPCT 1.3 11/23/2022   RBC 3.41 (L) 08/20/2023   RETICCTABS 63,000 11/08/2020   No results found for: KPAFRELGTCHN, LAMBDASER, KAPLAMBRATIO No results found for: IGGSERUM, IGA, IGMSERUM No results found for: STEPHANY CARLOTA BENSON MARKEL EARLA JOANNIE DOC VICK, SPEI   Chemistry      Component Value Date/Time   NA 135 08/14/2023 1419   K 4.1 08/14/2023 1419   CL 99 08/14/2023 1419   CO2 20 08/14/2023 1419   BUN 10 08/14/2023 1419   CREATININE 0.82 08/14/2023 1419   CREATININE 1.37 (H) 12/16/2021 0857   CREATININE 1.34 (H) 11/15/2021 0000      Component Value Date/Time   CALCIUM  9.7 08/14/2023 1419   ALKPHOS 67 08/14/2023 1419   AST 12 08/14/2023 1419   AST 13 (L) 12/16/2021 0857   ALT 10 08/14/2023 1419   ALT 13 12/16/2021 0857   BILITOT <0.2 08/14/2023 1419   BILITOT 0.4 12/16/2021 0857     Encounter Diagnoses  Name Primary?   Iron deficiency anemia due to chronic blood loss Yes   [redacted] weeks gestation of pregnancy    Impression and Plan: Selena Meyer is a very pleasant 38 yo [place] American female with long history of iron deficiency anemia. She is treated with IV Feraheme with her last infusion being on 01/16/2023.   Today her Hgb is  10.7 with mild elevation of WBC (13) Platelets are normal at  314 Iron studies are pending. Will replace if needed through GYN given pregnancy.   RTC 2 months APP, lab (CBC, iron, ferritin)  Lauraine CHRISTELLA Dais, PA-C 8/11/202511:27 AM

## 2023-08-20 NOTE — Progress Notes (Signed)
 Patient presents for early 2 hr gtt. Patient was sent to the lab to have labs drawn. Selena Swaminathan l Alanzo Lamb, CMA

## 2023-08-21 ENCOUNTER — Telehealth: Payer: Self-pay

## 2023-08-21 ENCOUNTER — Encounter: Payer: Self-pay | Admitting: Obstetrics and Gynecology

## 2023-08-21 ENCOUNTER — Ambulatory Visit: Payer: Self-pay | Admitting: Obstetrics and Gynecology

## 2023-08-21 DIAGNOSIS — O24919 Unspecified diabetes mellitus in pregnancy, unspecified trimester: Secondary | ICD-10-CM | POA: Insufficient documentation

## 2023-08-21 DIAGNOSIS — Z419 Encounter for procedure for purposes other than remedying health state, unspecified: Secondary | ICD-10-CM | POA: Diagnosis not present

## 2023-08-21 LAB — GLUCOSE TOLERANCE, 2 HOURS W/ 1HR
Glucose, 1 hour: 226 mg/dL — ABNORMAL HIGH (ref 70–179)
Glucose, 2 hour: 106 mg/dL (ref 70–152)
Glucose, Fasting: 92 mg/dL — ABNORMAL HIGH (ref 70–91)

## 2023-08-21 NOTE — Telephone Encounter (Signed)
 RN received mychart message regarding brown discharge, noticed yesterday for one time only. Pt reported has not seen since, currently denies any discharge, leaking, bleeding or any other symptoms or concerns. RN advised to monitor and let office know if has any changes. RN also reviewed when to go to MAU. Patient verbalized understanding.   [name], RN

## 2023-08-22 ENCOUNTER — Other Ambulatory Visit: Payer: Self-pay

## 2023-08-22 ENCOUNTER — Encounter: Payer: Self-pay | Admitting: Family

## 2023-08-22 ENCOUNTER — Encounter: Payer: Self-pay | Admitting: Obstetrics and Gynecology

## 2023-08-22 ENCOUNTER — Telehealth: Payer: Self-pay

## 2023-08-22 ENCOUNTER — Ambulatory Visit: Payer: Self-pay | Admitting: Neurology

## 2023-08-22 DIAGNOSIS — O24912 Unspecified diabetes mellitus in pregnancy, second trimester: Secondary | ICD-10-CM

## 2023-08-22 DIAGNOSIS — O09529 Supervision of elderly multigravida, unspecified trimester: Secondary | ICD-10-CM | POA: Insufficient documentation

## 2023-08-22 LAB — OXCARBAZEPINE (TRILEPTAL), SERUM: Oxcarbazepine SerPl-Mcnc: 7 ug/mL — ABNORMAL LOW (ref 10–35)

## 2023-08-22 MED ORDER — ACCU-CHEK SOFTCLIX LANCETS MISC: 1.0000 | Freq: Four times a day (QID) | 3 refills | Status: DC

## 2023-08-22 MED ORDER — OXCARBAZEPINE 300 MG PO TABS
600.0000 mg | ORAL_TABLET | Freq: Two times a day (BID) | ORAL | 3 refills | Status: DC
Start: 2023-08-22 — End: 2023-10-13

## 2023-08-22 MED ORDER — GLUCOSE BLOOD VI STRP: ORAL_STRIP | 3 refills | Status: DC

## 2023-08-22 MED ORDER — ACCU-CHEK GUIDE W/DEVICE KIT: 1.0000 | PACK | Freq: Four times a day (QID) | 0 refills | Status: DC

## 2023-08-22 NOTE — Progress Notes (Signed)
 Please call and advise the patient that her Trileptal  level is still low. We are increasing the dose to 600 mg (2 tablets) twice daily. We will also get a level next month. Patient is currently pregnant.  Please remind to check her level next month, and to call us  with any interim questions, concerns, problems or updates. Thanks,   Pastor Falling, MD

## 2023-08-22 NOTE — Progress Notes (Addendum)
 Received verbal orders from Dr. Nicholaus for Diabetic supplies and referral for education. RN called patient to notify and left HIPAA compliant voicemail to return call.  [name], RN  Spoke with patient at 1:53pm, answered questions. Pt verbalized understanding and reported has DM education appointment scheduled. Pt also reported brown bleeding that she noticed when vomiting. RN advised for patient to be evaluated, scheduled 8/14, reviewed when to go to MAU. Pt verbalized understanding.   [name], RN

## 2023-08-22 NOTE — Telephone Encounter (Signed)
 Dr. Nicholaus, patient will be scheduled as soon as possible.  Auth Submission: NO AUTH NEEDED Site of care: Site of care: [facility] Payer: [employer] commercial and [employer] Medicaid Medication & CPT/J Code(s) submitted: Venofer (Iron Sucrose) J1756 Diagnosis Code:  Route of submission (phone, fax, portal):  Phone # Fax # Auth type: Buy/Bill PB Units/visits requested: 500mg  x 2 doses Reference number:  Approval from: 08/22/23 to 01/09/24

## 2023-08-23 ENCOUNTER — Ambulatory Visit (INDEPENDENT_AMBULATORY_CARE_PROVIDER_SITE_OTHER): Admitting: Obstetrics and Gynecology

## 2023-08-23 ENCOUNTER — Encounter: Payer: Self-pay | Admitting: Obstetrics and Gynecology

## 2023-08-23 ENCOUNTER — Ambulatory Visit: Payer: Self-pay | Admitting: Medical Oncology

## 2023-08-23 ENCOUNTER — Other Ambulatory Visit (HOSPITAL_COMMUNITY)
Admission: RE | Admit: 2023-08-23 | Discharge: 2023-08-23 | Disposition: A | Discharge status: Home / Self Care | Source: Ambulatory Visit | Attending: Obstetrics and Gynecology | Admitting: Obstetrics and Gynecology

## 2023-08-23 VITALS — BP 139/85 | HR 93 | Wt 190.0 lb

## 2023-08-23 DIAGNOSIS — O4690 Antepartum hemorrhage, unspecified, unspecified trimester: Secondary | ICD-10-CM | POA: Insufficient documentation

## 2023-08-23 DIAGNOSIS — O09529 Supervision of elderly multigravida, unspecified trimester: Secondary | ICD-10-CM

## 2023-08-23 DIAGNOSIS — O0990 Supervision of high risk pregnancy, unspecified, unspecified trimester: Secondary | ICD-10-CM | POA: Diagnosis not present

## 2023-08-23 DIAGNOSIS — Z8632 Personal history of gestational diabetes: Secondary | ICD-10-CM

## 2023-08-23 NOTE — Progress Notes (Signed)
   PRENATAL VISIT NOTE  Subjective:  Selena Meyer is a 38 y.o. G2P0101 at [redacted]w[redacted]d being seen today for ongoing prenatal care.  She is currently monitored for the following issues for this high-risk pregnancy and has FSGS (focal segmental glomerulosclerosis); SVT (supraventricular tachycardia) (HCC); History of diet controlled gestational diabetes mellitus (GDM); Chronic hypertension; Moderate left ventricular hypertrophy; Supervision of high risk pregnancy, antepartum; Nonintractable epilepsy without status epilepticus (HCC); Hypertensive nephropathy; Proteinuria; Diabetes in pregnancy; and AMA (advanced maternal age) multigravida 35+ on their problem list.  Patient reports brown vaginal discharge. She is sexually active. Last time was painful. .  Contractions: Not present. Vag. Bleeding: Scant (brown x 3 days, notices when vomiting).  Movement: Absent. Denies leaking of fluid.   The following portions of the patient's history were reviewed and updated as appropriate: allergies, current medications, past family history, past medical history, past social history, past surgical history and problem list.   Objective:    Vitals:   08/23/23 0856  BP: 139/85  Pulse: 93  Weight: 190 lb (86.2 kg)    Fetal Status:  Fetal Heart Rate (bpm): 143   Movement: Absent    General: Alert, oriented and cooperative. Patient is in no acute distress.  Skin: Skin is warm and dry. No rash noted.   Cardiovascular: Normal heart rate noted  Respiratory: Normal respiratory effort, no problems with respiration noted  Abdomen: Soft, gravid, appropriate for gestational age.  Pain/Pressure: Absent     Pelvic: Cervical exam performed in the presence of a chaperone Cervix closed, brown discharge noted. No abnormalities of the cervix.         Extremities: Normal range of motion.  Edema: None  Mental Status: Normal mood and affect. Normal behavior. Normal judgment and thought content.   Assessment and Plan:  Pregnancy:  G2P0101 at [redacted]w[redacted]d 1. Supervision of high risk pregnancy, antepartum (Primary) AFP recommended today in setting of AMA and VB.   2. Vaginal bleeding during pregnancy Check cultures today. SSE today normal. CE closed.  Will contact MFM to get US  to evaluate placenta and location.  Advised no intercourse until we obtain US .   3. Antepartum multigravida of advanced maternal age LR NIPS  Preterm labor symptoms and general obstetric precautions including but not limited to vaginal bleeding, contractions, leaking of fluid and fetal movement were reviewed in detail with the patient. Please refer to After Visit Summary for other counseling recommendations.   No follow-ups on file.  Future Appointments  Date Time Provider Department Center  09/04/2023 11:15 AM [facility]  09/05/2023  4:10 PM Erik Kieth BROCKS, MD [facility]  09/11/2023  7:00 AM [facility] PROVIDER 1 [facility]  09/11/2023  7:30 AM [facility] US4 [facility]  09/13/2023 11:20 AM Tobb, Kardie, DO [facility]  09/24/2023  2:00 PM Darrell Bruckner, [facility]  10/08/2023  1:45 PM Gregg Lek, MD [facility] None  10/22/2023 10:45 AM [facility] None  10/22/2023 11:00 AM Covington, Lauraine HERO, PA-C [facility] None    Vina Solian, MD

## 2023-08-23 NOTE — Addendum Note (Signed)
 Addended by: ORLINDA SILVANO ORN on: 08/23/2023 09:26 AM   Modules accepted: Orders

## 2023-08-24 ENCOUNTER — Ambulatory Visit: Attending: Obstetrics and Gynecology | Admitting: Obstetrics and Gynecology

## 2023-08-24 ENCOUNTER — Ambulatory Visit (HOSPITAL_BASED_OUTPATIENT_CLINIC_OR_DEPARTMENT_OTHER)

## 2023-08-24 VITALS — BP 107/51 | HR 101

## 2023-08-24 DIAGNOSIS — O09892 Supervision of other high risk pregnancies, second trimester: Secondary | ICD-10-CM | POA: Diagnosis not present

## 2023-08-24 DIAGNOSIS — O4690 Antepartum hemorrhage, unspecified, unspecified trimester: Secondary | ICD-10-CM | POA: Diagnosis present

## 2023-08-24 DIAGNOSIS — N189 Chronic kidney disease, unspecified: Secondary | ICD-10-CM | POA: Diagnosis not present

## 2023-08-24 DIAGNOSIS — G40909 Epilepsy, unspecified, not intractable, without status epilepticus: Secondary | ICD-10-CM | POA: Insufficient documentation

## 2023-08-24 DIAGNOSIS — O99412 Diseases of the circulatory system complicating pregnancy, second trimester: Secondary | ICD-10-CM | POA: Diagnosis not present

## 2023-08-24 DIAGNOSIS — O24912 Unspecified diabetes mellitus in pregnancy, second trimester: Secondary | ICD-10-CM

## 2023-08-24 DIAGNOSIS — O99212 Obesity complicating pregnancy, second trimester: Secondary | ICD-10-CM | POA: Diagnosis not present

## 2023-08-24 DIAGNOSIS — O4412 Placenta previa with hemorrhage, second trimester: Secondary | ICD-10-CM

## 2023-08-24 DIAGNOSIS — O09292 Supervision of pregnancy with other poor reproductive or obstetric history, second trimester: Secondary | ICD-10-CM | POA: Diagnosis not present

## 2023-08-24 DIAGNOSIS — I129 Hypertensive chronic kidney disease with stage 1 through stage 4 chronic kidney disease, or unspecified chronic kidney disease: Secondary | ICD-10-CM | POA: Diagnosis not present

## 2023-08-24 DIAGNOSIS — O09212 Supervision of pregnancy with history of pre-term labor, second trimester: Secondary | ICD-10-CM | POA: Diagnosis not present

## 2023-08-24 DIAGNOSIS — O99352 Diseases of the nervous system complicating pregnancy, second trimester: Secondary | ICD-10-CM | POA: Insufficient documentation

## 2023-08-24 DIAGNOSIS — E669 Obesity, unspecified: Secondary | ICD-10-CM

## 2023-08-24 DIAGNOSIS — Z3A17 17 weeks gestation of pregnancy: Secondary | ICD-10-CM

## 2023-08-24 DIAGNOSIS — O09529 Supervision of elderly multigravida, unspecified trimester: Secondary | ICD-10-CM

## 2023-08-24 DIAGNOSIS — O24419 Gestational diabetes mellitus in pregnancy, unspecified control: Secondary | ICD-10-CM | POA: Insufficient documentation

## 2023-08-24 DIAGNOSIS — I10 Essential (primary) hypertension: Secondary | ICD-10-CM

## 2023-08-24 DIAGNOSIS — O10012 Pre-existing essential hypertension complicating pregnancy, second trimester: Secondary | ICD-10-CM

## 2023-08-24 DIAGNOSIS — O10212 Pre-existing hypertensive chronic kidney disease complicating pregnancy, second trimester: Secondary | ICD-10-CM | POA: Insufficient documentation

## 2023-08-24 DIAGNOSIS — Z8632 Personal history of gestational diabetes: Secondary | ICD-10-CM

## 2023-08-24 DIAGNOSIS — O09522 Supervision of elderly multigravida, second trimester: Secondary | ICD-10-CM | POA: Insufficient documentation

## 2023-08-24 DIAGNOSIS — O352XX0 Maternal care for (suspected) hereditary disease in fetus, not applicable or unspecified: Secondary | ICD-10-CM | POA: Insufficient documentation

## 2023-08-24 LAB — CERVICOVAGINAL ANCILLARY ONLY
Bacterial Vaginitis (gardnerella): NEGATIVE
Candida Glabrata: NEGATIVE
Candida Vaginitis: NEGATIVE
Chlamydia: NEGATIVE
Comment: NEGATIVE
Comment: NEGATIVE
Comment: NEGATIVE
Comment: NEGATIVE
Comment: NEGATIVE
Comment: NORMAL
Neisseria Gonorrhea: NEGATIVE
Trichomonas: NEGATIVE

## 2023-08-24 NOTE — Progress Notes (Signed)
 After review, MFM consult with provider is not indicated for today  Arna Ranks, MD 08/24/2023 2:11 PM  [facility]

## 2023-08-25 LAB — AFP, SERUM, OPEN SPINA BIFIDA
AFP MoM: 1.12
AFP Value: 30.4 ng/mL
Gest. Age on Collection Date: 16.6 wk
Maternal Age At EDD: 40.4 a
OSBR Risk 1 IN: 4998
Test Results:: NEGATIVE
Weight: 190 [lb_av]

## 2023-08-27 ENCOUNTER — Encounter: Payer: Self-pay | Admitting: Obstetrics and Gynecology

## 2023-08-27 ENCOUNTER — Ambulatory Visit: Payer: Self-pay | Admitting: Obstetrics and Gynecology

## 2023-08-27 DIAGNOSIS — O0990 Supervision of high risk pregnancy, unspecified, unspecified trimester: Secondary | ICD-10-CM

## 2023-08-29 LAB — CULTURE, OB URINE

## 2023-08-29 LAB — URINE CULTURE, OB REFLEX

## 2023-08-31 ENCOUNTER — Ambulatory Visit

## 2023-09-02 ENCOUNTER — Encounter: Payer: Self-pay | Admitting: Obstetrics and Gynecology

## 2023-09-02 DIAGNOSIS — O4400 Placenta previa specified as without hemorrhage, unspecified trimester: Secondary | ICD-10-CM | POA: Insufficient documentation

## 2023-09-02 DIAGNOSIS — O4440 Low lying placenta NOS or without hemorrhage, unspecified trimester: Secondary | ICD-10-CM | POA: Insufficient documentation

## 2023-09-04 ENCOUNTER — Encounter: Attending: Obstetrics and Gynecology | Admitting: Dietician

## 2023-09-04 ENCOUNTER — Ambulatory Visit (INDEPENDENT_AMBULATORY_CARE_PROVIDER_SITE_OTHER): Admitting: Dietician

## 2023-09-04 ENCOUNTER — Other Ambulatory Visit: Payer: Self-pay

## 2023-09-04 DIAGNOSIS — Z3A00 Weeks of gestation of pregnancy not specified: Secondary | ICD-10-CM | POA: Insufficient documentation

## 2023-09-04 DIAGNOSIS — O24912 Unspecified diabetes mellitus in pregnancy, second trimester: Secondary | ICD-10-CM

## 2023-09-04 DIAGNOSIS — Z3A18 18 weeks gestation of pregnancy: Secondary | ICD-10-CM

## 2023-09-04 DIAGNOSIS — Z713 Dietary counseling and surveillance: Secondary | ICD-10-CM | POA: Insufficient documentation

## 2023-09-04 NOTE — Progress Notes (Signed)
 Patient was seen for Gestational Diabetes on 09/04/2023  Start time 1125 and End time 1210   Estimated due date: 02/01/2023; [redacted]w[redacted]d   Clinical: Medications: aspirin , iron, prenatal vitamin, folic acid , nifedipine , midazolam , reglan , oxcarbazepine , labetalol  Medical History: GDM last pregnancy and diabetes during pregnancy currently Labs: OGTT fasting 82, 1 hour 226, 2 hour 106 on 08/20/2023, A1c 5.8% on 08/14/2023  Dietary and Lifestyle History: Patient lives with her in-laws.  Her mother-in-law does the shopping and cooking.  She is not employed. Still struggles with nausea. She required insulin  during her last pregnancy.  Baby born at 36 weeks.  Has increased fluid in the brain with normal development.  Physical Activity: none Stress: good unless she sees that her blood pressure that is low   Sleep: good  24 hr Recall:  First Meal:  usually skips but ate a hamburger and fries this am Snack:  [place] wafer bars. Second meal:  chicken and shrimp with rice [place] Snack:  pineapple Third meal:  meatloaf, green beans, rice, tomatoes Snack:  none Beverages:  water  NUTRITION INTERVENTION  Nutrition education (E-1) on the following topics:   Initial Follow-up  [x]  []  Definition of Gestational Diabetes [x]  []  Why dietary management is important in controlling blood glucose [x]  []  Effects each nutrient has on blood glucose levels [x]  []  Simple carbohydrates vs complex carbohydrates [x]  []  Fluid intake [x]  []  Creating a balanced meal plan []  []  Carbohydrate counting  [x]  []  When to check blood glucose levels [x]  []  Proper blood glucose monitoring techniques [x]  []  Effect of stress and stress reduction techniques  [x]  []  Exercise effect on blood glucose levels, appropriate exercise during pregnancy []  []  Importance of limiting caffeine  and abstaining from alcohol and smoking [x]  []  Medications used for blood sugar control during pregnancy [x]  []  Hypoglycemia and rule of  15 [x]  []  Postpartum self care Reviewed insulin  instruction.    Accu Chek Guide Me Patient has a meter prior to visit. Patient is not testing pre breakfast and 2 hours after each meal. FBS: none  Postprandial: 122 after a meal (last week) and 202 45 minutes after a fast food meal this am  Patient instructed to monitor glucose levels: FBS: 60 - <= 95 mg/dL; 2 hour: <= 879 mg/dL  Patient received handouts: Nutrition Diabetes and Pregnancy Carbohydrate Counting List Blood glucose log Snack ideas for diabetes during pregnancy  Patient will be seen for follow-up as needed.

## 2023-09-05 ENCOUNTER — Ambulatory Visit (INDEPENDENT_AMBULATORY_CARE_PROVIDER_SITE_OTHER): Admitting: Obstetrics and Gynecology

## 2023-09-05 VITALS — BP 134/82 | HR 96 | Wt 197.0 lb

## 2023-09-05 DIAGNOSIS — N051 Unspecified nephritic syndrome with focal and segmental glomerular lesions: Secondary | ICD-10-CM

## 2023-09-05 DIAGNOSIS — I517 Cardiomegaly: Secondary | ICD-10-CM

## 2023-09-05 DIAGNOSIS — O0990 Supervision of high risk pregnancy, unspecified, unspecified trimester: Secondary | ICD-10-CM

## 2023-09-05 DIAGNOSIS — I129 Hypertensive chronic kidney disease with stage 1 through stage 4 chronic kidney disease, or unspecified chronic kidney disease: Secondary | ICD-10-CM | POA: Diagnosis not present

## 2023-09-05 DIAGNOSIS — G40909 Epilepsy, unspecified, not intractable, without status epilepticus: Secondary | ICD-10-CM

## 2023-09-05 DIAGNOSIS — Z3A18 18 weeks gestation of pregnancy: Secondary | ICD-10-CM

## 2023-09-05 DIAGNOSIS — D509 Iron deficiency anemia, unspecified: Secondary | ICD-10-CM

## 2023-09-05 DIAGNOSIS — O09529 Supervision of elderly multigravida, unspecified trimester: Secondary | ICD-10-CM

## 2023-09-05 DIAGNOSIS — I10 Essential (primary) hypertension: Secondary | ICD-10-CM

## 2023-09-05 DIAGNOSIS — O09522 Supervision of elderly multigravida, second trimester: Secondary | ICD-10-CM

## 2023-09-05 DIAGNOSIS — O4402 Placenta previa specified as without hemorrhage, second trimester: Secondary | ICD-10-CM

## 2023-09-05 DIAGNOSIS — O24912 Unspecified diabetes mellitus in pregnancy, second trimester: Secondary | ICD-10-CM

## 2023-09-05 DIAGNOSIS — O99012 Anemia complicating pregnancy, second trimester: Secondary | ICD-10-CM

## 2023-09-05 DIAGNOSIS — I471 Supraventricular tachycardia, unspecified: Secondary | ICD-10-CM

## 2023-09-05 MED ORDER — DEXCOM G6 TRANSMITTER MISC: 1.0000 [IU] | Freq: Every day | 11 refills | Status: DC

## 2023-09-05 MED ORDER — DEXCOM G6 SENSOR MISC: 1.0000 [IU] | Freq: Every day | 11 refills | Status: DC

## 2023-09-05 MED ORDER — INSULIN GLARGINE-YFGN 100 UNIT/ML ~~LOC~~ SOPN: 6.0000 [IU] | PEN_INJECTOR | Freq: Every morning | SUBCUTANEOUS | 1 refills | Status: DC

## 2023-09-05 MED ORDER — DEXCOM G6 RECEIVER DEVI: 1.0000 [IU] | Freq: Every day | 11 refills | Status: DC

## 2023-09-05 NOTE — Progress Notes (Signed)
     PRENATAL VISIT NOTE  Subjective:  Selena Meyer is a 38 y.o. G2P0101 at [redacted]w[redacted]d being seen today for ongoing prenatal care.  She is currently monitored for the following issues for this high-risk pregnancy and has FSGS (focal segmental glomerulosclerosis); Anemia; SVT (supraventricular tachycardia) (HCC); Chronic hypertension; Moderate left ventricular hypertrophy; Supervision of high risk pregnancy, antepartum; Nonintractable epilepsy without status epilepticus (HCC); Hypertensive nephropathy; Proteinuria; Diabetes in pregnancy; AMA (advanced maternal age) multigravida 35+; and Placenta previa on their problem list.  Patient reports doing ok.  Contractions: Not present. Vag. Bleeding: Scant (pt reporting can be little to heavy, currently scant).  Movement: Present. Denies leaking of fluid.   The following portions of the patient's history were reviewed and updated as appropriate: allergies, current medications, past family history, past medical history, past social history, past surgical history and problem list.   Objective:   Vitals:   09/05/23 1619  BP: 134/82  Pulse: 96  Weight: 197 lb (89.4 kg)   Fetal Status: Fetal Heart Rate (bpm): 159   Movement: Present     General:  Alert, oriented and cooperative. Patient is in no acute distress.  Skin: Skin is warm and dry. No rash noted.   Cardiovascular: Normal heart rate noted  Respiratory: Normal respiratory effort, no problems with respiration noted  Abdomen: Soft, gravid, appropriate for gestational age.  Pain/Pressure: Absent      Assessment and Plan:  Pregnancy: G2P0101 at [redacted]w[redacted]d Supervision of pregnancy/18 weeks Neg AFP last visit Anatomy scheduled 9/2  3. Chronic hypertension F/w Tobb Normotensive on labetalol  400 TID, nifed 60/90 Baseline PC c/w proteinuria, Cr 0.82; remainder WNL ldASA 162mg  Next cardiology appt 9/4 Antenatal testing, serial growth per MFM   4. FSGS (focal segmental glomerulosclerosis),  proteinuria Baseline PC 1713, Cr 0.82 Next nephrology appointment scheduled 9/22  5. Iron deficiency anemia, unspecified iron deficiency anemia type Follows with heme, last ferritin WNL. Recommended monthly monitoring no further IV iron scheduled CBC/ferritin next appt   6. SVT (supraventricular tachycardia) (HCC) 7. Moderate left ventricular hypertrophy Asymptomatic, on labetalol   Last echo 08/01/17 F/w Dr. Sheena   8. Early GDM Has checked BG x 3 and all 130s+ Will start with lantus  6u qAM Discussed importance of BG monitoring to help us  titrate insulin  Reviewed accuchecks 4x/d versus CGM. She would like to try CGM  9. Placenta previa Dx in early [place], expect this will move.  F/u with US  next week   9. Nonintractable epilepsy without status epilepticus, unspecified epilepsy type (HCC) - f/w neuro - increased trileptal  after last level was subtherapeutic   Please refer to After Visit Summary for other counseling recommendations.   Future Appointments  Date Time Provider Department Center  09/13/2023 11:20 AM Tobb, Kardie, DO [facility]  09/20/2023  1:50 PM Erik Kieth BROCKS, MD [facility]  09/24/2023  2:00 PM Darrell Bruckner, [facility]  10/04/2023  1:15 PM [facility]  10/04/2023  4:10 PM Cleatus Moccasin, MD [facility]  10/08/2023  1:45 PM Gregg Lek, MD [facility] None  10/09/2023  2:45 PM [facility] PROVIDER 1 [facility]  10/09/2023  3:00 PM [facility] US1 [facility]  10/22/2023 10:45 AM [facility] None  10/22/2023 11:00 AM Covington, Lauraine HERO, PA-C [facility] None   Kieth BROCKS Erik, MD

## 2023-09-11 ENCOUNTER — Encounter: Payer: Self-pay | Admitting: Obstetrics and Gynecology

## 2023-09-11 ENCOUNTER — Ambulatory Visit (HOSPITAL_BASED_OUTPATIENT_CLINIC_OR_DEPARTMENT_OTHER)

## 2023-09-11 ENCOUNTER — Ambulatory Visit: Attending: Obstetrics and Gynecology | Admitting: Obstetrics

## 2023-09-11 ENCOUNTER — Encounter: Payer: Self-pay | Admitting: *Deleted

## 2023-09-11 ENCOUNTER — Other Ambulatory Visit: Payer: Self-pay | Admitting: *Deleted

## 2023-09-11 VITALS — BP 146/81

## 2023-09-11 DIAGNOSIS — O99352 Diseases of the nervous system complicating pregnancy, second trimester: Secondary | ICD-10-CM | POA: Insufficient documentation

## 2023-09-11 DIAGNOSIS — I471 Supraventricular tachycardia, unspecified: Secondary | ICD-10-CM | POA: Insufficient documentation

## 2023-09-11 DIAGNOSIS — O09522 Supervision of elderly multigravida, second trimester: Secondary | ICD-10-CM

## 2023-09-11 DIAGNOSIS — O10912 Unspecified pre-existing hypertension complicating pregnancy, second trimester: Secondary | ICD-10-CM | POA: Insufficient documentation

## 2023-09-11 DIAGNOSIS — O0992 Supervision of high risk pregnancy, unspecified, second trimester: Secondary | ICD-10-CM | POA: Diagnosis present

## 2023-09-11 DIAGNOSIS — G40909 Epilepsy, unspecified, not intractable, without status epilepticus: Secondary | ICD-10-CM | POA: Diagnosis not present

## 2023-09-11 DIAGNOSIS — O09292 Supervision of pregnancy with other poor reproductive or obstetric history, second trimester: Secondary | ICD-10-CM | POA: Diagnosis not present

## 2023-09-11 DIAGNOSIS — Z3A19 19 weeks gestation of pregnancy: Secondary | ICD-10-CM | POA: Insufficient documentation

## 2023-09-11 DIAGNOSIS — O24414 Gestational diabetes mellitus in pregnancy, insulin controlled: Secondary | ICD-10-CM | POA: Diagnosis not present

## 2023-09-11 DIAGNOSIS — O99212 Obesity complicating pregnancy, second trimester: Secondary | ICD-10-CM | POA: Insufficient documentation

## 2023-09-11 DIAGNOSIS — O358XX0 Maternal care for other (suspected) fetal abnormality and damage, not applicable or unspecified: Secondary | ICD-10-CM

## 2023-09-11 DIAGNOSIS — O99412 Diseases of the circulatory system complicating pregnancy, second trimester: Secondary | ICD-10-CM | POA: Insufficient documentation

## 2023-09-11 DIAGNOSIS — O10012 Pre-existing essential hypertension complicating pregnancy, second trimester: Secondary | ICD-10-CM

## 2023-09-11 DIAGNOSIS — O0990 Supervision of high risk pregnancy, unspecified, unspecified trimester: Secondary | ICD-10-CM

## 2023-09-11 NOTE — Progress Notes (Signed)
 MFM Consult Note  Selena Meyer is currently at 19 weeks and 4 days.  She was seen due to advanced maternal age (38 years old), chronic hypertension currently treated with labetalol  and nifedipine , and maternal seizure disorder treated with Trileptal .    She was recently diagnosed with insulin  controlled gestational diabetes.  She just started insulin  treatment 2 days ago.  She reports that her fasting fingerstick values were in the mid 90s range and her 2-hour postprandial fingerstick values were in the 130s to 150s range.  Her blood pressure today was 146/81.  She had a cell free DNA test drawn earlier in her pregnancy which indicated a low risk for trisomy 69, 71, and 13.  A female fetus is predicted.  A posterior placenta previa was noted earlier in her pregnancy.  Sonographic findings Single intrauterine pregnancy at 19w 4d  Fetal cardiac activity:  Observed and appears normal. Presentation: Breech. The anatomic structures that were well seen appear normal without evidence of soft markers. Due to poor acoustic windows some structures remain suboptimally visualized. Fetal biometry shows the estimated fetal weight at the 95 percentile.  Amniotic fluid: Within normal limits.  MVP: 5.93 cm. Placenta: Posterior low-lying. Adnexa: No abnormality visualized. Cervical length: 4.1 cm.  There are limitations of prenatal ultrasound such as the inability to detect certain abnormalities due to poor visualization. Various factors such as fetal position, gestational age and maternal body habitus may increase the difficulty in visualizing the fetal anatomy.    The following were discussed during today's consultation:  Advanced maternal age in pregnancy  The increased risk of fetal aneuploidy due to advanced maternal age was discussed.  Due to advanced maternal age, the patient was offered and declined an amniocentesis today for definitive diagnosis of fetal aneuploidy.  She is comfortable with the low  risk indicated by her cell free DNA test.  Chronic hypertension in pregnancy  The patient was advised to continue taking labetalol  and nifedipine  as prescribed for blood pressure control throughout her pregnancy.   Should her blood pressures continue to to be increased, her labetalol  dose may be increased.   The increased risk of superimposed preeclampsia, an indicated preterm delivery, and possible fetal growth restriction due to chronic hypertension in pregnancy was discussed.  We will continue to follow her with monthly growth scans. Weekly fetal testing should be started at around 32 weeks.  To decrease her risk of superimposed preeclampsia, she should continue taking 2 tablets of baby aspirin  daily for preeclampsia prophylaxis.  Gestational diabetes  She was advised to continue to monitor her fingersticks 4 times daily (fasting and 2 hours after each meal).   She was advised that our goals for her fingerstick values are fasting values of 90-95 or less and two-hour postprandial values of 120 or less.   Her insulin  dose may have to be increased should the majority (greater than 50%) of her fingerstick values be above these values. The patient was advised that getting her fingerstick values as close to these goals as possible would provide her with the most optimal obstetrical outcome. The increased risk of polyhydramnios, fetal macrosomia, and preeclampsia associated with diabetes was also discussed.   Due to her age, chronic hypertension, and gestational diabetes, delivery may be considered at around 37 weeks.  Low-lying placenta The patient was reassured that the low-lying placenta will most likely move away from her cervix later in her pregnancy.  A follow-up exam was scheduled in 4 weeks to assess the fetal growth and  to complete the views of the fetal anatomy.    The patient stated that all of her questions were answered today.  A total of 30 minutes was spent counseling and  coordinating the care for this patient.  Greater than 50% of the time was spent in direct face-to-face contact.

## 2023-09-12 ENCOUNTER — Ambulatory Visit

## 2023-09-12 DIAGNOSIS — Z0130 Encounter for examination of blood pressure without abnormal findings: Secondary | ICD-10-CM

## 2023-09-12 NOTE — Progress Notes (Signed)
 Subjective:  Selena Meyer is a 38 y.o. female here for BP check. Pt reported elevated BP after visit to ultrasound yesterday with 144/97 at home, did come down to 129/79, notified office.   Hypertension ROS: taking medications as instructed, no medication side effects noted, no TIA's, no chest pain on exertion, no dyspnea on exertion, and no swelling of ankles.    Objective:  LMP 04/27/2023 (Exact Date)   Appearance alert, well appearing, and in no distress. General exam BP noted to be well controlled today in office. 130/75 with office BP and cuff, 124/77 with home BP and cuff.   Assessment:   Blood Pressure well controlled and stable.   Plan:  Current treatment plan is effective, no change in therapy.. Pt had questions about CGM blood sugars, readings 149 on G6 and home Accu check fingerstick 141. Dr. Erik reviewed with patient.  [name], RN

## 2023-09-13 ENCOUNTER — Ambulatory Visit: Attending: Cardiology | Admitting: Cardiology

## 2023-09-13 ENCOUNTER — Encounter: Payer: Self-pay | Admitting: Cardiology

## 2023-09-13 VITALS — BP 144/86 | HR 93 | Ht 64.0 in | Wt 194.4 lb

## 2023-09-13 DIAGNOSIS — Z3A19 19 weeks gestation of pregnancy: Secondary | ICD-10-CM

## 2023-09-13 DIAGNOSIS — O10919 Unspecified pre-existing hypertension complicating pregnancy, unspecified trimester: Secondary | ICD-10-CM

## 2023-09-13 DIAGNOSIS — N182 Chronic kidney disease, stage 2 (mild): Secondary | ICD-10-CM

## 2023-09-13 DIAGNOSIS — N051 Unspecified nephritic syndrome with focal and segmental glomerular lesions: Secondary | ICD-10-CM

## 2023-09-13 DIAGNOSIS — Z8632 Personal history of gestational diabetes: Secondary | ICD-10-CM | POA: Diagnosis not present

## 2023-09-13 MED ORDER — HYDRALAZINE HCL 50 MG PO TABS: 50.0000 mg | ORAL_TABLET | Freq: Three times a day (TID) | ORAL | 3 refills | Status: DC

## 2023-09-13 NOTE — Patient Instructions (Addendum)
 Medication Instructions:  Your physician has recommended you make the following change in your medication:  START: Hydralazine  50 mg three times daily  *If you need a refill on your cardiac medications before your next appointment, please call your pharmacy*  Follow-Up: At [facility], you and your health needs are our priority.  As part of our continuing mission to provide you with exceptional heart care, our providers are all part of one team.  This team includes your primary Cardiologist (physician) and Advanced Practice Providers or APPs (Physician Assistants and Nurse Practitioners) who all work together to provide you with the care you need, when you need it.  Your next appointment:   6 week(s)  Provider:   Kardie Tobb, DO    Other Instructions Please see [place] in 1-2 weeks.

## 2023-09-14 ENCOUNTER — Ambulatory Visit

## 2023-09-15 NOTE — Progress Notes (Signed)
 Cardio-Obstetrics Clinic  Follow Up Note   Date:  09/15/2023   ID:  Selena Meyer, DOB [birth year], MRN [id number]  PCP:  Willo Mini, NP   [facility] Providers Cardiologist:  Orlanda Frankum, DO  Electrophysiologist:  None        Referring MD: Willo Mini, NP   Chief Complaint:  I am doing well  History of Present Illness:    Selena Meyer is a 38 y.o. female [G2P0101] who returns for follow up for postpartum cardiovascular care.    Medical includes SVT, Hypertension, hypertriglyceridemia, FSGS, gestational diabetes and seizures.   She is here with her daughter and is currently [redacted] weeks pregnant.  She has had some complaints with her blood sugar. She has been following the OB teams as well.    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    Gestational diabetes    Hypertension    Hypokalemia 11/02/2020   Irregular menstrual cycle    Irregular periods/menstrual cycles 05/03/2017   Will hold off on Provera or other fertility stuff for now, pt advised pregnancy is not recommended until BP under better control at least, plus other workup pen   Polycystic ovaries 11/09/2015   Proteinuria 02/28/2013   Stage 2 chronic kidney disease 04/27/2020   SVT (supraventricular tachycardia) (HCC) 11/01/2020   Systolic murmur 07/16/2017   Tobacco abuse 02/28/2013   Uncontrolled hypertension 05/10/2017    Past Surgical History:  Procedure Laterality Date   NO PAST SURGERIES        OB History     Gravida  2   Para  1   Term      Preterm  1   AB      Living  1      SAB       IAB      Ectopic      Multiple  0   Live Births  1               Current Medications: Current Meds  Medication Sig   Accu-Chek Softclix Lancets lancets 1 each by Other route 4 (four) times daily.   acetaminophen  (TYLENOL ) 500 MG tablet Take 500 mg by mouth as needed.   albuterol  (VENTOLIN  HFA) 108 (90 Base) MCG/ACT inhaler Inhale 2 puffs into the lungs every 6 (six) hours as needed for wheezing or shortness of breath.   aspirin  EC 81 MG tablet Take 2 tablets (162 mg total) by mouth daily. Swallow whole.   Blood Glucose Monitoring Suppl (ACCU-CHEK GUIDE) w/Device KIT 1 Device by Does not apply route 4 (four) times daily.   Continuous Glucose Receiver (DEXCOM G6 RECEIVER) DEVI 1 Units by Does not apply route daily at 6 (six) AM.   Continuous Glucose Sensor (DEXCOM G6 SENSOR) MISC 1 Units by Does not apply route daily. Change device after 10 days   Continuous Glucose Transmitter (DEXCOM G6 TRANSMITTER) MISC 1 Units by Does not apply route daily at 6 (six) AM. Change device after 10 days   ferrous sulfate  325 (65 FE) MG tablet Take 1 tablet (325 mg total) by mouth every other day.   folic  acid (FOLVITE ) 1 MG tablet Take 3 tablets (3 mg total) by mouth daily.   glucose blood test strip Check blood sugars 4x daily   hydrALAZINE  (APRESOLINE ) 50 MG tablet Take 1 tablet (50 mg total) by mouth 3 (three) times daily.   insulin  glargine-yfgn (SEMGLEE ) 100 UNIT/ML Pen Inject 6 Units into the skin in the morning.   ipratropium (ATROVENT ) 0.06 % nasal spray Place 2 sprays into both nostrils 4 (four) times daily. As needed for runny nose / postnasal drip   labetalol  (NORMODYNE ) 200 MG tablet Take 2 tablets (400 mg total) by mouth 3 (three) times daily.   metoCLOPramide  (REGLAN ) 10 MG tablet Take 1 tablet (10 mg total) by mouth 4 (four) times daily as needed for nausea or vomiting.   Midazolam  (NAYZILAM ) 5 MG/0.1ML SOLN Place 5 mg into the nose as needed (For seizure cluster or seizure lasting more  than 5 minutes).   NIFEdipine  (ADALAT  CC) 60 MG 24 hr tablet Take 60mg  in the morning and 90 mg in the evening (1 and 1/2 tablets)   Oxcarbazepine  (TRILEPTAL ) 300 MG tablet Take 2 tablets (600 mg total) by mouth 2 (two) times daily.   Prenatal Vit-Fe Fumarate-FA (MULTIVITAMIN-PRENATAL) 27-0.8 MG TABS tablet Take 1 tablet by mouth daily at 12 noon.   pyridOXINE (VITAMIN B6) 100 MG tablet Take 1 tablet (100 mg total) by mouth in the morning and at bedtime.     Allergies:   Ibuprofen   Social History   Socioeconomic History   Marital status: Married    Spouse name: Marsha   Number of children: 0   Years of education: Not on file   Highest education level: Not on file  Occupational History    Employer: UNEMPLOYED  Tobacco Use   Smoking status: Former    Current packs/day: 0.00    Average packs/day: 0.3 packs/day for 5.0 years (1.3 ttl pk-yrs)    Types: Cigarettes    Start date: 05/16/2016    Quit date: 05/16/2021    Years since quitting: 2.3   Smokeless tobacco: Never   Tobacco comments:    Quit smoking 06/02/2023after her mom passed away  Vaping Use   Vaping status: Never Used  Substance and Sexual Activity   Alcohol use: Not Currently    Comment: occasional use; social drinker   Drug use: No   Sexual activity: Yes    Partners: Male    Birth control/protection: None    Comment: Pregnant  Other Topics Concern   Not on file  Social History Narrative   Not on file   Social Drivers of Health   Financial Resource Strain: Low Risk  (05/08/2022)   Received from [facility]   Overall Financial Resource Strain (CARDIA)    Difficulty of Paying Living Expenses: Not very hard  Food Insecurity: No Food Insecurity (09/28/2022)   Hunger Vital Sign    Worried About Running Out of Food in the Last Year: Never true    Ran Out of Food in the Last Year: Never true  Transportation Needs: No Transportation Needs (09/28/2022)   PRAPARE - Administrator, Civil Service (Medical):  No    Lack of Transportation (Non-Medical): No  Physical Activity: Unknown (11/03/2020)   Received from [facility]   Exercise Vital Sign    On average, how many days per week do you engage in moderate to strenuous exercise (like a brisk walk)?: 5 days    On average, how many minutes do  you engage in exercise at this level?: Patient declined  Stress: Stress Concern Present (11/03/2020)   Received from [facility] of Occupational Health - Occupational Stress Questionnaire    Feeling of Stress : To some extent  Social Connections: Unknown (05/10/2021)   Received from [facility]   Social Network    Social Network: Not on file      Family History  Problem Relation Age of Onset   Diabetes Mother    High blood pressure Mother    Renal Disease Mother    Diabetes Sister    Cancer Maternal Grandmother    Diabetes Maternal Grandmother    High blood pressure Maternal Grandmother    Cancer Maternal Aunt        breast   Other Father       ROS:   Please see the history of present illness.     All other systems reviewed and are negative.   Labs/EKG Reviewed:    EKG:   EKG is was not ordered today.    Recent Labs: 12/10/2022: Magnesium  1.9 08/14/2023: ALT 10; BUN 10; Creatinine, Ser 0.82; Potassium 4.1; Sodium 135 08/20/2023: Hemoglobin 10.7; Platelet Count 314   Recent Lipid Panel Lab Results  Component Value Date/Time   CHOL 240 (H) 11/15/2021 12:00 AM   TRIG 492 (H) 11/15/2021 12:00 AM   HDL 40 (L) 11/15/2021 12:00 AM   CHOLHDL 6.0 (H) 11/15/2021 12:00 AM   LDLCALC  11/15/2021 12:00 AM     Comment:     . LDL cholesterol not calculated. Triglyceride levels greater than 400 mg/dL invalidate calculated LDL results. . Reference range: <100 . Desirable range <100 mg/dL for primary prevention;   <70 mg/dL for patients with CHD or diabetic patients  with > or = 2 CHD risk factors. SABRA LDL-C is now calculated using the Martin-Hopkins  calculation,  which is a validated novel method providing  better accuracy than the Friedewald equation in the  estimation of LDL-C.  Gladis APPLETHWAITE et al. SANDREA. 7986;689(80): 2061-2068  (http://education.[employer].com/faq/FAQ164)     Physical Exam:    VS:  BP (!) 144/86 (BP Location: Right Arm, Patient Position: Sitting, Cuff Size: Normal)   Pulse 93   Ht 5' 4 (1.626 m)   Wt 194 lb 6.4 oz (88.2 kg)   LMP 04/27/2023 (Exact Date)   SpO2 97%   BMI 33.37 kg/m     Wt Readings from Last 3 Encounters:  09/13/23 194 lb 6.4 oz (88.2 kg)  09/12/23 193 lb (87.5 kg)  09/05/23 197 lb (89.4 kg)     GEN:  Well nourished, well developed in no acute distress HEENT: Normal NECK: No JVD; No carotid bruits LYMPHATICS: No lymphadenopathy CARDIAC: RRR, no murmurs, rubs, gallops RESPIRATORY:  Clear to auscultation without rales, wheezing or rhonchi  ABDOMEN: Soft, non-tender, non-distended MUSCULOSKELETAL:  No edema; No deformity  SKIN: Warm and dry NEUROLOGIC:  Alert and oriented x 3 PSYCHIATRIC:  Normal affect    Risk Assessment/Risk Calculators:                  ASSESSMENT & PLAN:    Chronic hypertension in pregnancy FSGC Seizure Disorder CKD Hx of gestational diabetes  Her blood pressure is not at target. She is currently on Nifedipine  60 mg in the am and 90 mg in the evening, then Labetalol  400 mg TID. I will add hydralazine  50 mg TID. I am hoping this will keep her blood pressure at target.   -  will defer to our OBGYN and MFM for plan delivery around 36-38 weeks due to history of severe preeclampsia. Continue Asprin for preeclampsia prophylaxis  - Pt meets eligibility for IMPACT study, discussed risks/benefits of the study  - Provided with information sheet. - Aware that the research team will be in contact within 24-48hrs, followed by the Lead CHW    She needs a close follow-up.  Will see the patient in 6 weeks. She will see our cardioOb pharmacist prior to this.   Patient  Instructions  Medication Instructions:  Your physician has recommended you make the following change in your medication:  START: Hydralazine  50 mg three times daily  *If you need a refill on your cardiac medications before your next appointment, please call your pharmacy*  Follow-Up: At [facility], you and your health needs are our priority.  As part of our continuing mission to provide you with exceptional heart care, our providers are all part of one team.  This team includes your primary Cardiologist (physician) and Advanced Practice Providers or APPs (Physician Assistants and Nurse Practitioners) who all work together to provide you with the care you need, when you need it.  Your next appointment:   6 week(s)  Provider:   Amandamarie Feggins, DO    Other Instructions Please see [place] in 1-2 weeks.        Dispo:  No follow-ups on file.   Medication Adjustments/Labs and Tests Ordered: Current medicines are reviewed at length with the patient today.  Concerns regarding medicines are outlined above.  Tests Ordered: No orders of the defined types were placed in this encounter.  Medication Changes: Meds ordered this encounter  Medications   hydrALAZINE  (APRESOLINE ) 50 MG tablet    Sig: Take 1 tablet (50 mg total) by mouth 3 (three) times daily.    Dispense:  270 tablet    Refill:  3

## 2023-09-16 ENCOUNTER — Encounter: Payer: Self-pay | Admitting: Obstetrics and Gynecology

## 2023-09-17 ENCOUNTER — Telehealth: Payer: Self-pay

## 2023-09-17 DIAGNOSIS — Z006 Encounter for examination for normal comparison and control in clinical research program: Secondary | ICD-10-CM

## 2023-09-17 NOTE — Telephone Encounter (Signed)
 Spoke with pt regarding Impact Mom Trial. I went over Impact Mom information sheet over the phone with pt. Pt did not have questions. Pt was verbally consented for Impact Mom Trial on 17-Sep-2023 @ 10:35.

## 2023-09-20 ENCOUNTER — Ambulatory Visit (INDEPENDENT_AMBULATORY_CARE_PROVIDER_SITE_OTHER): Admitting: Obstetrics and Gynecology

## 2023-09-20 VITALS — BP 117/70 | HR 105 | Wt 199.0 lb

## 2023-09-20 DIAGNOSIS — O24912 Unspecified diabetes mellitus in pregnancy, second trimester: Secondary | ICD-10-CM

## 2023-09-20 DIAGNOSIS — Z3A20 20 weeks gestation of pregnancy: Secondary | ICD-10-CM

## 2023-09-20 DIAGNOSIS — O0990 Supervision of high risk pregnancy, unspecified, unspecified trimester: Secondary | ICD-10-CM

## 2023-09-20 DIAGNOSIS — O4442 Low lying placenta NOS or without hemorrhage, second trimester: Secondary | ICD-10-CM | POA: Diagnosis not present

## 2023-09-20 DIAGNOSIS — I517 Cardiomegaly: Secondary | ICD-10-CM

## 2023-09-20 DIAGNOSIS — I129 Hypertensive chronic kidney disease with stage 1 through stage 4 chronic kidney disease, or unspecified chronic kidney disease: Secondary | ICD-10-CM

## 2023-09-20 DIAGNOSIS — R809 Proteinuria, unspecified: Secondary | ICD-10-CM

## 2023-09-20 DIAGNOSIS — O10012 Pre-existing essential hypertension complicating pregnancy, second trimester: Secondary | ICD-10-CM

## 2023-09-20 DIAGNOSIS — I471 Supraventricular tachycardia, unspecified: Secondary | ICD-10-CM

## 2023-09-20 DIAGNOSIS — I10 Essential (primary) hypertension: Secondary | ICD-10-CM

## 2023-09-20 DIAGNOSIS — N051 Unspecified nephritic syndrome with focal and segmental glomerular lesions: Secondary | ICD-10-CM

## 2023-09-20 DIAGNOSIS — G40909 Epilepsy, unspecified, not intractable, without status epilepticus: Secondary | ICD-10-CM

## 2023-09-20 DIAGNOSIS — O4440 Low lying placenta NOS or without hemorrhage, unspecified trimester: Secondary | ICD-10-CM

## 2023-09-20 MED ORDER — ONDANSETRON 8 MG PO TBDP: 4.0000 mg | ORAL_TABLET | Freq: Three times a day (TID) | ORAL | 2 refills | Status: AC | PRN

## 2023-09-20 MED ORDER — INSULIN GLARGINE-YFGN 100 UNIT/ML ~~LOC~~ SOPN: 10.0000 [IU] | PEN_INJECTOR | Freq: Every morning | SUBCUTANEOUS | 0 refills | Status: DC

## 2023-09-20 NOTE — Progress Notes (Signed)
 PRENATAL VISIT NOTE  Subjective:  Selena Meyer is a 38 y.o. G2P0101 at [redacted]w[redacted]d being seen today for ongoing prenatal care.  She is currently monitored for the following issues for this high-risk pregnancy and has FSGS (focal segmental glomerulosclerosis); Anemia; SVT (supraventricular tachycardia) (HCC); Chronic hypertension; Moderate left ventricular hypertrophy; Supervision of high risk pregnancy, antepartum; Nonintractable epilepsy without status epilepticus (HCC); Hypertensive nephropathy; Proteinuria; Diabetes in pregnancy; AMA (advanced maternal age) multigravida 35+; and Low-lying placenta on their problem list.  Patient reports vomiting, random, no triggers, up to 3-4 times a day. Keeping oodles of noodles and chicken soup down.  Contractions: Not present. Vag. Bleeding: None.  Movement: Present. Denies leaking of fluid.   The following portions of the patient's history were reviewed and updated as appropriate: allergies, current medications, past family history, past medical history, past social history, past surgical history and problem list.   Objective:   Vitals:   09/20/23 1410  BP: 117/70  Pulse: (!) 105  Weight: 199 lb (90.3 kg)    Fetal Status: Fetal Heart Rate (bpm): 160   Movement: Present     General:  Alert, oriented and cooperative. Patient is in no acute distress.  Skin: Skin is warm and dry. No rash noted.   Cardiovascular: Normal heart rate noted  Respiratory: Normal respiratory effort, no problems with respiration noted  Abdomen: Soft, gravid, appropriate for gestational age.  Pain/Pressure: Absent      Assessment and Plan:  Pregnancy: G2P0101 at [redacted]w[redacted]d 1. Supervision of high risk pregnancy, antepartum (Primary) 2. [redacted] weeks gestation of pregnancy AFP negative Anatomy incomplete but normal; f/u scheduled 9/30 Rx for zofran  sent  3. Chronic hypertension Normotensive today on labetalol  400 TID, nifedipine  60/90, and hydral 50 TID Baseline PC c/w  proteinuria, Cr 0.82; remainder WNL ldASA 162mg  Following with Dr. Sheena, saw her 9/4 and next appt is scheduled 9/15 Antenatal testing, serial growth & TOD per MFM - likely ~37wk  4. Diabetes mellitus during pregnancy in second trimester, unspecified diabetes mellitus type Would like to take off dexcom as numbers have been inaccurate BG reviewed - 9/14 fasting values at goal; meals are pan elevated. INCREASE to Semglee  10u qAM and follow up in 2 weeks for further titration Has lancet/test strip refills on her pharmacy app, but will resend rx if needed  5. FSGS (focal segmental glomerulosclerosis) 6. Hypertensive nephropathy 7. Proteinuria, unspecified type Baseline PC 1713, Cr 0.82 Next nephro appt 9/22  8. Moderate left ventricular hypertrophy 9. SVT (supraventricular tachycardia) (HCC) F/w Tobb, asymptomatic  10. Nonintractable epilepsy without status epilepticus, unspecified epilepsy type [facility]) F/w neuro, next appt 9/29 Continues on trileptal ; neuro is following drug levels  11. Low-lying placenta Will CTM with US , next 9.30  12. Anemia F/w heme, last ferritin WNL and Hgb appropriate for 2nd tri Will get CBC/ferritin next visit  Please refer to After Visit Summary for other counseling recommendations.   Return in about 2 weeks (around 10/04/2023) for ROB at 22 weeks.  Future Appointments  Date Time Provider Department Center  09/24/2023  2:00 PM Darrell Bruckner, [facility] H&V  10/04/2023  1:15 PM [facility]  10/04/2023  4:10 PM Cleatus Moccasin, MD [facility]  10/08/2023  1:45 PM Gregg Lek, MD [facility] None  10/09/2023  2:45 PM [facility] PROVIDER 1 [facility]  10/09/2023  3:00 PM [facility] US1 [facility]  10/22/2023 10:45 AM [facility] None  10/22/2023 11:00 AM Tonette Lauraine HERO, PA-C [facility] None  10/26/2023 11:00  AM Tobb, Kardie, DO [facility] H&V   Selena JAYSON Carolin, MD

## 2023-09-21 ENCOUNTER — Ambulatory Visit: Admitting: Pharmacist

## 2023-09-21 ENCOUNTER — Telehealth: Payer: Self-pay

## 2023-09-21 ENCOUNTER — Encounter: Payer: Self-pay | Admitting: Pharmacist

## 2023-09-21 VITALS — BP 126/70 | HR 84 | Wt 195.4 lb

## 2023-09-21 DIAGNOSIS — O10919 Unspecified pre-existing hypertension complicating pregnancy, unspecified trimester: Secondary | ICD-10-CM | POA: Diagnosis not present

## 2023-09-21 DIAGNOSIS — O0990 Supervision of high risk pregnancy, unspecified, unspecified trimester: Secondary | ICD-10-CM

## 2023-09-21 DIAGNOSIS — Z3A21 21 weeks gestation of pregnancy: Secondary | ICD-10-CM

## 2023-09-21 DIAGNOSIS — O24414 Gestational diabetes mellitus in pregnancy, insulin controlled: Secondary | ICD-10-CM | POA: Diagnosis not present

## 2023-09-21 DIAGNOSIS — Z419 Encounter for procedure for purposes other than remedying health state, unspecified: Secondary | ICD-10-CM | POA: Diagnosis not present

## 2023-09-21 NOTE — Telephone Encounter (Signed)
 Received PA request from [place], spoke with Toney LULLA Pastures PA dept. Medication covered however due to increase in units, cannot refill until 10/13, will need [employer] pharmacy override if patient were to run out. Pt originally prescribed 90 day supply. Spoke with [facility] pharmacy who reported would send over PA request form for RN to fill out when patient runs out of insulin  for office to submit.  [name], RN

## 2023-09-21 NOTE — Progress Notes (Signed)
 Patient ID: Selena Meyer                 DOB: 1985-03-15                      MRN: [id number]     HPI: Selena Meyer is a 38 y.o. female referred to Cardio OB clinic.  PMH is significant for HTN in pregnancy, GDM, FSGS, and epilepsy.  Patient presents today with husband. Currently managed on nifedipine , labetalol  and hydralazine  with no patient reported adverse effects.  Blood pressure has been controlled since starting hydralazine  and patient feels well. Blood sugar has been uncontrolled however. Fasting blood sugar has been at goal but post prandial readings are elevated. OB has increased insulin  to 10 units daily.  Patient denies chest pain, SOB, LEE.  Current HTN meds:  Hydralazine  50mg  TID Labetalol  400mg  TID Nifedipine  60mg  in morning, 90 in evening   Wt Readings from Last 3 Encounters:  09/20/23 199 lb (90.3 kg)  09/13/23 194 lb 6.4 oz (88.2 kg)  09/12/23 193 lb (87.5 kg)   BP Readings from Last 3 Encounters:  09/20/23 117/70  09/13/23 (!) 144/86  09/12/23 124/77   Pulse Readings from Last 3 Encounters:  09/20/23 (!) 105  09/13/23 93  09/12/23 92    Renal function: CrCl cannot be calculated (Patient's most recent lab result is older than the maximum 21 days allowed.).  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    Gestational diabetes    Hypertension    Hypokalemia 11/02/2020   Irregular menstrual cycle    Irregular periods/menstrual cycles 05/03/2017   Will hold off on Provera or other fertility stuff for now, pt advised pregnancy is not recommended until BP under better control at least, plus other workup pen   Polycystic ovaries 11/09/2015   Proteinuria  02/28/2013   Stage 2 chronic kidney disease 04/27/2020   SVT (supraventricular tachycardia) (HCC) 11/01/2020   Systolic murmur 07/16/2017   Tobacco abuse 02/28/2013   Uncontrolled hypertension 05/10/2017    Current Outpatient Medications on File Prior to Visit  Medication Sig Dispense Refill   Accu-Chek Softclix Lancets lancets 1 each by Other route 4 (four) times daily. 200 each 3   acetaminophen  (TYLENOL ) 500 MG tablet Take 500 mg by mouth as needed.     albuterol  (VENTOLIN  HFA) 108 (90 Base) MCG/ACT inhaler Inhale 2 puffs into the lungs every 6 (six) hours as needed for wheezing or shortness of breath. 1 each 1   aspirin  EC 81 MG tablet Take 2 tablets (162 mg total) by mouth daily. Swallow whole. 60 tablet 11   Blood Glucose Monitoring Suppl (ACCU-CHEK GUIDE) w/Device KIT 1 Device by Does not apply route 4 (four) times daily. 1 kit 0   ferrous sulfate  325 (65 FE) MG tablet Take 1 tablet (325 mg total) by mouth every other day. 100 tablet 3   folic acid  (FOLVITE ) 1 MG tablet Take 3 tablets (3 mg total) by mouth daily. 270 tablet 3   glucose blood test strip Check blood sugars 4x daily 200 each 3   hydrALAZINE  (APRESOLINE ) 50 MG tablet Take 1 tablet (50 mg total) by mouth 3 (three) times daily. 270 tablet  3   insulin  glargine-yfgn (SEMGLEE ) 100 UNIT/ML Pen Inject 10 Units into the skin in the morning. 15 mL 0   ipratropium (ATROVENT ) 0.06 % nasal spray Place 2 sprays into both nostrils 4 (four) times daily. As needed for runny nose / postnasal drip 15 mL 1   labetalol  (NORMODYNE ) 200 MG tablet Take 2 tablets (400 mg total) by mouth 3 (three) times daily.     metoCLOPramide  (REGLAN ) 10 MG tablet Take 1 tablet (10 mg total) by mouth 4 (four) times daily as needed for nausea or vomiting. 30 tablet 2   Midazolam  (NAYZILAM ) 5 MG/0.1ML SOLN Place 5 mg into the nose as needed (For seizure cluster or seizure lasting more than 5 minutes). 2 each 0   NIFEdipine  (ADALAT  CC) 60 MG 24 hr tablet Take 60mg   in the morning and 90 mg in the evening (1 and 1/2 tablets)     ondansetron  (ZOFRAN -ODT) 8 MG disintegrating tablet Take 0.5 tablets (4 mg total) by mouth every 8 (eight) hours as needed for nausea. 30 tablet 2   Oxcarbazepine  (TRILEPTAL ) 300 MG tablet Take 2 tablets (600 mg total) by mouth 2 (two) times daily. 120 tablet 3   Prenatal Vit-Fe Fumarate-FA (MULTIVITAMIN-PRENATAL) 27-0.8 MG TABS tablet Take 1 tablet by mouth daily at 12 noon.     pyridOXINE (VITAMIN B6) 100 MG tablet Take 1 tablet (100 mg total) by mouth in the morning and at bedtime. 60 tablet 11   No current facility-administered medications on file prior to visit.    Allergies  Allergen Reactions   Ibuprofen Hives     Assessment/Plan:  1. Hypertension -  Patient BP controlled in room at 126/70. No medication changes needed at this time.   Continue: Hydralazine  50mg  TID Labetalol  400mg  TID Nifedipine  60mg  in morning, 90mg  in evening Recheck in 2 weeks  Medford Bolk, PharmD, [place], CDCES, CPP [facility] [address], [place], [place] [address] Phone: [phone]; Fax: [phone] 09/21/2023 4:32 PM

## 2023-09-21 NOTE — Patient Instructions (Signed)
 SABRA

## 2023-09-24 ENCOUNTER — Ambulatory Visit: Admitting: Pharmacist

## 2023-09-24 ENCOUNTER — Other Ambulatory Visit: Payer: Self-pay | Admitting: Obstetrics and Gynecology

## 2023-09-24 ENCOUNTER — Encounter: Payer: Self-pay | Admitting: Cardiology

## 2023-09-24 ENCOUNTER — Other Ambulatory Visit: Payer: Self-pay

## 2023-09-24 DIAGNOSIS — I10 Essential (primary) hypertension: Secondary | ICD-10-CM

## 2023-09-24 DIAGNOSIS — O10919 Unspecified pre-existing hypertension complicating pregnancy, unspecified trimester: Secondary | ICD-10-CM

## 2023-09-24 MED ORDER — LABETALOL HCL 200 MG PO TABS: 400.0000 mg | ORAL_TABLET | Freq: Three times a day (TID) | ORAL | 3 refills | Status: DC

## 2023-09-24 NOTE — Telephone Encounter (Signed)
 RX sent in

## 2023-09-25 ENCOUNTER — Telehealth: Payer: Self-pay

## 2023-09-25 NOTE — Telephone Encounter (Signed)
 Called to make introduction and invite to [employer] support group.

## 2023-09-27 NOTE — Progress Notes (Unsigned)
 Patient was seen for Pre-existing Diabetes During Pregnancy on ***  Start time *** and End time ***   Estimated due date: 02/01/2024; ***w***d   Clinical: Medications: *** Medical History: *** Labs: OGTT fasting 92, 1 hour 226, 2 hour 106 on 08/20/2023 Lab Results  Component Value Date   HGBA1C 5.8 (H) 08/14/2023   Dietary and Lifestyle History: ***  Physical Activity: *** Stress: *** Sleep: ***  24 hr Recall:  First Meal:  *** Snack:  *** Second meal:  *** Snack:  *** Third meal:  *** Snack:  *** Beverages:  ***  NUTRITION INTERVENTION  Nutrition education (E-1) on the following topics:   Initial Follow-up  []  []  Definition of Gestational Diabetes []  []  Why dietary management is important in controlling blood glucose []  []  Effects each nutrient has on blood glucose levels []  []  Simple carbohydrates vs complex carbohydrates []  []  Fluid intake []  []  Creating a balanced meal plan []  []  Carbohydrate counting  []  []  When to check blood glucose levels []  []  Proper blood glucose monitoring techniques []  []  Effect of stress and stress reduction techniques  []  []  Exercise effect on blood glucose levels, appropriate exercise during pregnancy []  []  Importance of limiting caffeine  and abstaining from alcohol and smoking []  []  Medications used for blood sugar control during pregnancy []  []  Hypoglycemia and rule of 15 []  []  Postpartum self care  Blood glucose monitor given: *** Lot # *** Exp: *** CBG: *** mg/dL  *** Patient has a meter prior to visit. Patient is *** testing pre breakfast and 2 hours after each meal. FBS: *** Postprandial: ***  Patient instructed to monitor glucose levels: FBS: 60 - <= 95 mg/dL; 2 hour: <= 879 mg/dL  Patient received handouts: Nutrition Diabetes and Pregnancy Carbohydrate Counting List Blood glucose log Snack ideas for diabetes during pregnancy  Patient will be seen for follow-up as needed.

## 2023-10-02 ENCOUNTER — Encounter: Payer: Self-pay | Admitting: Neurology

## 2023-10-04 ENCOUNTER — Ambulatory Visit (INDEPENDENT_AMBULATORY_CARE_PROVIDER_SITE_OTHER): Admitting: Dietician

## 2023-10-04 ENCOUNTER — Encounter: Attending: Obstetrics and Gynecology | Admitting: Dietician

## 2023-10-04 ENCOUNTER — Other Ambulatory Visit: Payer: Self-pay

## 2023-10-04 ENCOUNTER — Ambulatory Visit (INDEPENDENT_AMBULATORY_CARE_PROVIDER_SITE_OTHER): Admitting: Obstetrics and Gynecology

## 2023-10-04 VITALS — BP 114/61 | HR 101 | Wt 200.0 lb

## 2023-10-04 DIAGNOSIS — I10 Essential (primary) hypertension: Secondary | ICD-10-CM

## 2023-10-04 DIAGNOSIS — Z713 Dietary counseling and surveillance: Secondary | ICD-10-CM | POA: Diagnosis not present

## 2023-10-04 DIAGNOSIS — D509 Iron deficiency anemia, unspecified: Secondary | ICD-10-CM

## 2023-10-04 DIAGNOSIS — O24912 Unspecified diabetes mellitus in pregnancy, second trimester: Secondary | ICD-10-CM

## 2023-10-04 DIAGNOSIS — O99012 Anemia complicating pregnancy, second trimester: Secondary | ICD-10-CM

## 2023-10-04 DIAGNOSIS — Z3A00 Weeks of gestation of pregnancy not specified: Secondary | ICD-10-CM | POA: Diagnosis not present

## 2023-10-04 DIAGNOSIS — Z3A22 22 weeks gestation of pregnancy: Secondary | ICD-10-CM

## 2023-10-04 DIAGNOSIS — O4442 Low lying placenta NOS or without hemorrhage, second trimester: Secondary | ICD-10-CM

## 2023-10-04 DIAGNOSIS — N051 Unspecified nephritic syndrome with focal and segmental glomerular lesions: Secondary | ICD-10-CM | POA: Diagnosis not present

## 2023-10-04 DIAGNOSIS — O09522 Supervision of elderly multigravida, second trimester: Secondary | ICD-10-CM

## 2023-10-04 DIAGNOSIS — G40909 Epilepsy, unspecified, not intractable, without status epilepticus: Secondary | ICD-10-CM | POA: Diagnosis not present

## 2023-10-04 DIAGNOSIS — O4440 Low lying placenta NOS or without hemorrhage, unspecified trimester: Secondary | ICD-10-CM

## 2023-10-04 DIAGNOSIS — D500 Iron deficiency anemia secondary to blood loss (chronic): Secondary | ICD-10-CM

## 2023-10-04 DIAGNOSIS — I517 Cardiomegaly: Secondary | ICD-10-CM

## 2023-10-04 DIAGNOSIS — Z5181 Encounter for therapeutic drug level monitoring: Secondary | ICD-10-CM

## 2023-10-04 DIAGNOSIS — O0990 Supervision of high risk pregnancy, unspecified, unspecified trimester: Secondary | ICD-10-CM

## 2023-10-04 DIAGNOSIS — G40109 Localization-related (focal) (partial) symptomatic epilepsy and epileptic syndromes with simple partial seizures, not intractable, without status epilepticus: Secondary | ICD-10-CM

## 2023-10-04 DIAGNOSIS — O09529 Supervision of elderly multigravida, unspecified trimester: Secondary | ICD-10-CM

## 2023-10-04 NOTE — Progress Notes (Signed)
 PRENATAL VISIT NOTE  Subjective:  Selena Meyer is a 38 y.o. G2P0101 at [redacted]w[redacted]d being seen today for ongoing prenatal care.  She is currently monitored for the following issues for this high-risk pregnancy and has FSGS (focal segmental glomerulosclerosis); Anemia; SVT (supraventricular tachycardia); Chronic hypertension; Moderate left ventricular hypertrophy; Supervision of high risk pregnancy, antepartum; Nonintractable epilepsy without status epilepticus (HCC); Hypertensive nephropathy; Proteinuria; Diabetes in pregnancy; AMA (advanced maternal age) multigravida 35+; and Low-lying placenta on their problem list.  Patient reports fatigue.  Contractions: Irritability. Vag. Bleeding: None.  Movement: Present. Denies leaking of fluid.   The following portions of the patient's history were reviewed and updated as appropriate: allergies, current medications, past family history, past medical history, past social history, past surgical history and problem list.   Objective:    Vitals:   10/04/23 1619  BP: 114/61  Pulse: (!) 101  Weight: 200 lb (90.7 kg)    Fetal Status:  Fetal Heart Rate (bpm): 147   Movement: Present    General: Alert, oriented and cooperative. Patient is in no acute distress.  Skin: Skin is warm and dry. No rash noted.   Cardiovascular: Normal heart rate noted  Respiratory: Normal respiratory effort, no problems with respiration noted  Abdomen: Soft, gravid, appropriate for gestational age.  Pain/Pressure: Present     Pelvic: Cervical exam performed in the presence of a chaperone Dilation: Closed Effacement (%): Thick Station: Ballotable  Extremities: Normal range of motion.  Edema: None  Mental Status: Normal mood and affect. Normal behavior. Normal judgment and thought content.   Assessment and Plan:  Pregnancy: G2P0101 at [redacted]w[redacted]d 1. Supervision of high risk pregnancy, antepartum (Primary) AFP wnl.  Discussed flu shot - she declines Checked cervix because feeling  sporadic tightening and as this is short interval pregnancy, we discussed the increased risk of PTB.   2. Low-lying placenta Recheck US  on 9/30. Anticipate will resolve.   3. Diabetes mellitus during pregnancy in second trimester, unspecified diabetes mellitus type Prefers CBG checks over CGM.  BG reviewed - fasting: 8-96, meals highly variable and only about 50% recorded (she fell asleep for others). Some meals are low/normal and some are high. Given the variability, I would not yet raise her insulin  from the Semglee  but would continue present management.  Current regimen: Semglee  10u qAM    4. Nonintractable epilepsy without status epilepticus, unspecified epilepsy type [facility]) Has appt on 9/30 with Neuro.  Single lifetime seizure Continues on trileptal  BID. Levels being followed and adjusted.   5. FSGS (focal segmental glomerulosclerosis) Follows with [facility]. Last saw them on 9/22. They recommended continuing as is. If any changes with proteinuria or creatinine, she will let them know.   6. Antepartum multigravida of advanced maternal age LR NIPS  7. Iron deficiency anemia, unspecified iron deficiency anemia type CBC and iron studies today - will release from future orders   8. Chronic hypertension Normotensive today on labetalol  400 TID, nifedipine  60/90, and hydral 50 TID Baseline PC c/w proteinuria, Cr 0.82; remainder WNL ldASA 162mg  Antenatal testing, serial growth & TOD per MFM - likely ~37wk  9. Moderate left ventricular hypertrophy Following with Dr. Sheena  10. Pregnancy with 22 completed weeks gestation  Preterm labor symptoms and general obstetric precautions including but not limited to vaginal bleeding, contractions, leaking of fluid and fetal movement were reviewed in detail with the patient. Please refer to After Visit Summary for other counseling recommendations.   Return in about 4 weeks (around 11/01/2023).  Future Appointments  Date Time Provider  Department Center  10/09/2023  9:15 AM Gregg Lek, MD [facility] None  10/09/2023  2:45 PM [facility] PROVIDER 1 [facility]  10/09/2023  3:00 PM [facility] US1 [facility]  10/12/2023  2:00 PM Pavero, Lonni, [facility] None  10/17/2023  3:10 PM Anyanwu, Gloris LABOR, MD [facility]  10/22/2023 10:45 AM [facility] None  10/22/2023 11:00 AM Tonette Lauraine HERO, PA-C [facility] None  10/26/2023 11:00 AM Tobb, Kardie, DO [facility]  11/01/2023  4:10 PM Erik Kieth BROCKS, MD [facility]  11/15/2023  4:15 PM [facility]    Vina Solian, MD

## 2023-10-05 LAB — CBC
Hematocrit: 29.6 % — ABNORMAL LOW (ref 34.0–46.6)
Hemoglobin: 9.7 g/dL — ABNORMAL LOW (ref 11.1–15.9)
MCH: 31.2 pg (ref 26.6–33.0)
MCHC: 32.8 g/dL (ref 31.5–35.7)
MCV: 95 fL (ref 79–97)
Platelets: 362 x10E3/uL (ref 150–450)
RBC: 3.11 x10E6/uL — ABNORMAL LOW (ref 3.77–5.28)
RDW: 12.9 % (ref 11.7–15.4)
WBC: 12.2 x10E3/uL — ABNORMAL HIGH (ref 3.4–10.8)

## 2023-10-05 LAB — IRON AND TIBC
Iron Saturation: 18 % (ref 15–55)
Iron: 68 ug/dL (ref 27–159)
Total Iron Binding Capacity: 368 ug/dL (ref 250–450)
UIBC: 300 ug/dL (ref 131–425)

## 2023-10-05 LAB — FOLATE: Folate: >20 ng/mL (ref >3.0)

## 2023-10-05 LAB — TSH: TSH: 0.894 u[IU]/mL (ref 0.450–4.500)

## 2023-10-05 LAB — FERRITIN: Ferritin: 17 ng/mL (ref 15–150)

## 2023-10-05 LAB — VITAMIN B12: Vitamin B-12: 448 pg/mL (ref 232–1245)

## 2023-10-08 ENCOUNTER — Ambulatory Visit: Payer: Self-pay | Admitting: Obstetrics and Gynecology

## 2023-10-08 ENCOUNTER — Ambulatory Visit: Admitting: Neurology

## 2023-10-09 ENCOUNTER — Other Ambulatory Visit: Payer: Self-pay

## 2023-10-09 ENCOUNTER — Ambulatory Visit: Attending: Obstetrics | Admitting: Obstetrics

## 2023-10-09 ENCOUNTER — Encounter: Payer: Self-pay | Admitting: Family

## 2023-10-09 ENCOUNTER — Encounter: Payer: Self-pay | Admitting: Neurology

## 2023-10-09 ENCOUNTER — Ambulatory Visit (INDEPENDENT_AMBULATORY_CARE_PROVIDER_SITE_OTHER): Admitting: Neurology

## 2023-10-09 ENCOUNTER — Other Ambulatory Visit: Payer: Self-pay | Admitting: *Deleted

## 2023-10-09 ENCOUNTER — Ambulatory Visit

## 2023-10-09 VITALS — BP 132/83 | HR 96 | Resp 14

## 2023-10-09 VITALS — BP 110/56

## 2023-10-09 DIAGNOSIS — O402XX0 Polyhydramnios, second trimester, not applicable or unspecified: Secondary | ICD-10-CM | POA: Insufficient documentation

## 2023-10-09 DIAGNOSIS — G40909 Epilepsy, unspecified, not intractable, without status epilepticus: Secondary | ICD-10-CM | POA: Insufficient documentation

## 2023-10-09 DIAGNOSIS — Z3A23 23 weeks gestation of pregnancy: Secondary | ICD-10-CM | POA: Diagnosis not present

## 2023-10-09 DIAGNOSIS — Z5181 Encounter for therapeutic drug level monitoring: Secondary | ICD-10-CM

## 2023-10-09 DIAGNOSIS — G40109 Localization-related (focal) (partial) symptomatic epilepsy and epileptic syndromes with simple partial seizures, not intractable, without status epilepticus: Secondary | ICD-10-CM | POA: Diagnosis not present

## 2023-10-09 DIAGNOSIS — O352XX0 Maternal care for (suspected) hereditary disease in fetus, not applicable or unspecified: Secondary | ICD-10-CM | POA: Diagnosis not present

## 2023-10-09 DIAGNOSIS — O09522 Supervision of elderly multigravida, second trimester: Secondary | ICD-10-CM | POA: Insufficient documentation

## 2023-10-09 DIAGNOSIS — O2692 Pregnancy related conditions, unspecified, second trimester: Secondary | ICD-10-CM | POA: Insufficient documentation

## 2023-10-09 DIAGNOSIS — O10912 Unspecified pre-existing hypertension complicating pregnancy, second trimester: Secondary | ICD-10-CM

## 2023-10-09 DIAGNOSIS — O10012 Pre-existing essential hypertension complicating pregnancy, second trimester: Secondary | ICD-10-CM | POA: Diagnosis not present

## 2023-10-09 DIAGNOSIS — O09892 Supervision of other high risk pregnancies, second trimester: Secondary | ICD-10-CM | POA: Diagnosis not present

## 2023-10-09 DIAGNOSIS — O358XX0 Maternal care for other (suspected) fetal abnormality and damage, not applicable or unspecified: Secondary | ICD-10-CM | POA: Insufficient documentation

## 2023-10-09 DIAGNOSIS — O09212 Supervision of pregnancy with history of pre-term labor, second trimester: Secondary | ICD-10-CM | POA: Insufficient documentation

## 2023-10-09 DIAGNOSIS — O99352 Diseases of the nervous system complicating pregnancy, second trimester: Secondary | ICD-10-CM

## 2023-10-09 DIAGNOSIS — O99212 Obesity complicating pregnancy, second trimester: Secondary | ICD-10-CM

## 2023-10-09 DIAGNOSIS — O24414 Gestational diabetes mellitus in pregnancy, insulin controlled: Secondary | ICD-10-CM

## 2023-10-09 DIAGNOSIS — O10919 Unspecified pre-existing hypertension complicating pregnancy, unspecified trimester: Secondary | ICD-10-CM

## 2023-10-09 DIAGNOSIS — O09292 Supervision of pregnancy with other poor reproductive or obstetric history, second trimester: Secondary | ICD-10-CM | POA: Diagnosis not present

## 2023-10-09 NOTE — Progress Notes (Signed)
 [facility]  PATIENT: Selena Meyer DOB: 1985-08-23  REQUESTING CLINICIAN: Willo Mini, NP HISTORY FROM: Patient/Aunt and Chart Review  REASON FOR VISIT: Follow up epilepsy    HISTORICAL  CHIEF COMPLAINT:  Chief Complaint  Patient presents with   Seizures    Rm13, 38 yr old daughter present (DAHLIA), Sz: pt stated that her last sz occurred     INTERVAL HISTORY 10/09/2023 Patient presents today for follow-up, last visit was in March, since then she has been doing well, she is currently 5 months pregnant, her last oxcarbazepine  was lower at 7, we increased her oxcarbazepine  to 600 mg twice daily.  She denies any seizure, denies any side effect from the medication.  In terms of her pregnancy, she tells me she is dealing with elevated blood pressure.  She is due in December.   INTERVAL HISTORY 03/22/2023:  Patient presents today for follow-up, last visit was in December, at that time we discontinued the Keppra  and started her on oxcarbazepine .  She is currently on oxcarbazepine  300 mg twice daily, denies any side effect from the medication and report no additional seizures. No additional complaints or concerns. 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    HISTORY OF PRESENT ILLNESS:  This  is a 38 year old woman with past medical history of Hypertension, migraine headaches, preeclampsia who is presenting after first lifetime seizure.  This occurred on December 1 around 6 AM. Patient tells me that she does not remember much from the seizure or the hospitalization but was told by her husband that she had a generalized convulsion lasting 1 to 2-minute.  EMS was called.  In the ED she did have a head CT that was concerning for PRES but her MRI showed FLAIR hyperintensities that favored small microvascular ischemic changes.  Her EEG showed frequent left frontotemporal spikes.  She was loaded on Keppra  and started on Keppra  500 mg twice daily.   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 She denies any previous history of seizures and denies any seizure risk factors.  Handedness: Right handed   Onset: December 1  Seizure Type: Generalized convulsion   Current frequency: Only one   Any injuries from seizures: Denies   Seizure risk factors: None reported   Previous ASMs: Levetiracetam    Currenty ASMs: Oxcarbazepine    ASMs side effects: She already suffers form post partum depression  Brain Images: Flair hyperintensities  Previous EEGs: Left frontotemporal epileptiform discharges    OTHER MEDICAL CONDITIONS: Hypertension, History of Migraines, Pre-eclampsia   REVIEW OF SYSTEMS: Full 14 system review of systems performed and negative with exception of: As noted in the HPI  ALLERGIES: Allergies  Allergen Reactions   Ibuprofen Hives and Rash    HOME MEDICATIONS: Outpatient Medications Prior to Visit  Medication Sig Dispense Refill   Accu-Chek Softclix Lancets lancets 1 each by Other route 4 (four) times daily. 200 each 3   acetaminophen  (TYLENOL ) 500 MG tablet Take 500 mg by mouth as needed.     albuterol  (VENTOLIN  HFA) 108 (90 Base) MCG/ACT inhaler Inhale 2 puffs into the lungs every 6 (six) hours as needed for wheezing or shortness of breath. 1 each 1   aspirin  EC 81 MG tablet Take 2 tablets (162 mg total) by mouth daily. Swallow whole. 60 tablet 11   Blood Glucose Monitoring Suppl (ACCU-CHEK GUIDE) w/Device KIT 1 Device by  Does not apply route 4 (four) times daily. 1 kit 0   ferrous sulfate  325 (65 FE) MG tablet Take 1 tablet (325 mg total) by mouth every other day. 100 tablet 3   folic acid  (FOLVITE ) 1 MG tablet Take 3 tablets (3 mg total) by mouth daily. 270 tablet 3   glucose blood test strip Check blood sugars 4x daily 200 each 3   hydrALAZINE  (APRESOLINE ) 50 MG tablet Take 1 tablet (50 mg total) by mouth 3 (three) times daily. 270 tablet 3    insulin  glargine-yfgn (SEMGLEE ) 100 UNIT/ML Pen Inject 10 Units into the skin in the morning. 15 mL 0   labetalol  (NORMODYNE ) 200 MG tablet Take 2 tablets (400 mg total) by mouth 3 (three) times daily. 540 tablet 3   metoCLOPramide  (REGLAN ) 10 MG tablet Take 1 tablet (10 mg total) by mouth 4 (four) times daily as needed for nausea or vomiting. 30 tablet 2   Midazolam  (NAYZILAM ) 5 MG/0.1ML SOLN Place 5 mg into the nose as needed (For seizure cluster or seizure lasting more than 5 minutes). 2 each 0   NIFEdipine  (ADALAT  CC) 60 MG 24 hr tablet Take 60mg  in the morning and 90 mg in the evening (1 and 1/2 tablets)     ondansetron  (ZOFRAN -ODT) 8 MG disintegrating tablet Take 0.5 tablets (4 mg total) by mouth every 8 (eight) hours as needed for nausea. 30 tablet 2   Oxcarbazepine  (TRILEPTAL ) 300 MG tablet Take 2 tablets (600 mg total) by mouth 2 (two) times daily. 120 tablet 3   Prenatal Vit-Fe Fumarate-FA (MULTIVITAMIN-PRENATAL) 27-0.8 MG TABS tablet Take 1 tablet by mouth daily at 12 noon.     ipratropium (ATROVENT ) 0.06 % nasal spray Place 2 sprays into both nostrils 4 (four) times daily. As needed for runny nose / postnasal drip 15 mL 1   pyridOXINE (VITAMIN B6) 100 MG tablet Take 1 tablet (100 mg total) by mouth in the morning and at bedtime. 60 tablet 11   No facility-administered medications prior to visit.    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    Gestational diabetes    Hypertension    Hypokalemia 11/02/2020   Irregular menstrual cycle    Irregular periods/menstrual cycles 05/03/2017   Will hold off on Provera or other fertility stuff for now, pt advised pregnancy  is not recommended until BP under better control at least, plus other workup pen   Polycystic ovaries 11/09/2015   Proteinuria 02/28/2013   Stage 2 chronic kidney disease 04/27/2020   SVT (supraventricular tachycardia) 11/01/2020   Systolic murmur 07/16/2017   Tobacco abuse 02/28/2013   Uncontrolled hypertension 05/10/2017    PAST SURGICAL HISTORY: Past Surgical History:  Procedure Laterality Date   NO PAST SURGERIES      FAMILY HISTORY: Family History  Problem Relation Age of Onset   Diabetes Mother    High blood pressure Mother    Renal Disease Mother    Diabetes Sister    Cancer Maternal Grandmother    Diabetes Maternal Grandmother    High blood pressure Maternal Grandmother    Cancer Maternal Aunt        breast   Other Father     SOCIAL  HISTORY: Social History   Socioeconomic History   Marital status: Married    Spouse name: Marsha   Number of children: 0   Years of education: Not on file   Highest education level: Not on file  Occupational History    Employer: UNEMPLOYED  Tobacco Use   Smoking status: Former    Current packs/day: 0.00    Average packs/day: 0.3 packs/day for 5.0 years (1.3 ttl pk-yrs)    Types: Cigarettes    Start date: 05/16/2016    Quit date: 05/16/2021    Years since quitting: 2.4   Smokeless tobacco: Never   Tobacco comments:    Quit smoking 05-17-23after her mom passed away  Vaping Use   Vaping status: Never Used  Substance and Sexual Activity   Alcohol use: Not Currently    Comment: occasional use; social drinker   Drug use: No   Sexual activity: Yes    Partners: Male    Birth control/protection: None    Comment: Pregnant  Other Topics Concern   Not on file  Social History Narrative   Not on file   Social Drivers of Health   Financial Resource Strain: Low Risk  (05/08/2022)   Received from [facility]   Overall Financial Resource Strain (CARDIA)    Difficulty of Paying Living Expenses: Not very hard  Food Insecurity:  No Food Insecurity (09/28/2022)   Hunger Vital Sign    Worried About Running Out of Food in the Last Year: Never true    Ran Out of Food in the Last Year: Never true  Transportation Needs: No Transportation Needs (09/28/2022)   PRAPARE - Administrator, Civil Service (Medical): No    Lack of Transportation (Non-Medical): No  Physical Activity: Unknown (11/03/2020)   Received from [facility]   Exercise Vital Sign    On average, how many days per week do you engage in moderate to strenuous exercise (like a brisk walk)?: 5 days    On average, how many minutes do you engage in exercise at this level?: Patient declined  Stress: Stress Concern Present (11/03/2020)   Received from [facility] - Occupational Stress Questionnaire    Feeling of Stress : To some extent  Social Connections: Unknown (05/10/2021)   Received from [facility]   Social Network    Social Network: Not on file  Intimate Partner Violence: Not At Risk (09/28/2022)   Humiliation, Afraid, Rape, and Kick questionnaire    Fear of Current or Ex-Partner: No    Emotionally Abused: No    Physically Abused: No    Sexually Abused: No    PHYSICAL EXAM  GENERAL EXAM/CONSTITUTIONAL: Vitals:  Vitals:   10/09/23 0916  BP: 132/83  Pulse: 96  Resp: 14  SpO2: 99%    There is no height or weight on file to calculate BMI. Wt Readings from Last 3 Encounters:  10/04/23 200 lb (90.7 kg)  09/21/23 195 lb 6.4 oz (88.6 kg)  09/20/23 199 lb (90.3 kg)   Patient is in no distress; well developed, nourished and groomed; neck is supple  MUSCULOSKELETAL: Gait, strength, tone, movements noted in Neurologic exam below  NEUROLOGIC: MENTAL STATUS:      No data to display         awake, alert, oriented to person, place and time recent and remote memory intact normal attention and concentration language fluent, comprehension intact, naming intact fund of knowledge  appropriate  CRANIAL NERVE:  2nd, 3rd, 4th, 6th - Visual fields full to confrontation, extraocular muscles intact, no nystagmus 5th - facial sensation symmetric 7th - facial strength symmetric 8th - hearing intact 9th - palate elevates symmetrically, uvula midline 11th - shoulder shrug symmetric 12th - tongue protrusion midline  MOTOR:  normal bulk and tone, full strength in the BUE, BLE  SENSORY:  normal and symmetric to light touch  COORDINATION:  finger-nose-finger, fine finger movements normal  GAIT/STATION:  normal  DIAGNOSTIC DATA (LABS, IMAGING, TESTING) - I reviewed patient records, labs, notes, testing and imaging myself where available.  Lab Results  Component Value Date   WBC 12.2 (H) 10/04/2023   HGB 9.7 (L) 10/04/2023   HCT 29.6 (L) 10/04/2023   MCV 95 10/04/2023   PLT 362 10/04/2023      Component Value Date/Time   NA 135 08/14/2023 1419   K 4.1 08/14/2023 1419   CL 99 08/14/2023 1419   CO2 20 08/14/2023 1419   GLUCOSE 84 08/14/2023 1419   GLUCOSE 169 (H) 12/10/2022 0800   BUN 10 08/14/2023 1419   CREATININE 0.82 08/14/2023 1419   CREATININE 1.37 (H) 12/16/2021 0857   CREATININE 1.34 (H) 11/15/2021 0000   CALCIUM  9.7 08/14/2023 1419   PROT 6.3 08/14/2023 1419   ALBUMIN 3.7 (L) 08/14/2023 1419   AST 12 08/14/2023 1419   AST 13 (L) 12/16/2021 0857   ALT 10 08/14/2023 1419   ALT 13 12/16/2021 0857   ALKPHOS 67 08/14/2023 1419   BILITOT <0.2 08/14/2023 1419   BILITOT 0.4 12/16/2021 0857   GFRNONAA 53 (L) 12/10/2022 0800   GFRNONAA 51 (L) 12/16/2021 0857   GFRNONAA 56 (L) 04/26/2020 0802   GFRAA 65 04/26/2020 0802   Lab Results  Component Value Date   CHOL 240 (H) 11/15/2021   HDL 40 (L) 11/15/2021   LDLCALC  11/15/2021     Comment:     . LDL cholesterol not calculated. Triglyceride levels greater than 400 mg/dL invalidate calculated LDL results. . Reference range: <100 . Desirable range <100 mg/dL for primary prevention;   <70 mg/dL  for patients with CHD or diabetic patients  with > or = 2 CHD risk factors. SABRA LDL-C is now calculated using the Martin-Hopkins  calculation, which is a validated novel method providing  better accuracy than the Friedewald equation in the  estimation of LDL-C.  Gladis APPLETHWAITE et al. SANDREA. 7986;689(80): 2061-2068  (http://education.[facility].com/faq/FAQ164)    TRIG 492 (H) 11/15/2021   Lab Results  Component Value Date   HGBA1C 5.8 (H) 08/14/2023   Lab Results  Component Value Date   VITAMINB12 448 10/04/2023   Lab Results  Component Value Date   TSH 0.894 10/04/2023    MRI Brain without contrast 12/10/2022 1. No acute finding or specific cause for seizure. 2. Mild chronic white matter disease, medical history favoring premature chronic small vessel ischemia.  EEG 12/10/2022 -Spike, left frontal-anterior temporal region.   I personally reviewed brain Images and previous EEG reports.   ASSESSMENT AND PLAN  38 y.o. year old female  with history of hypertension, migraine headaches, previous preeclampsia who is presenting for follow-up for her epilepsy.  She is doing well, denies any seizure or seizure like activity.  She is currently pregnant, on oxcarbazepine  600 mg twice daily.  Plan will be to check oxcarbazepine  level and adjust her dose accordingly.  We will continue to check her level monthly until delivery.  Patient understands to contact us  after delivery so  we can reduce her dose.  I will see her in 6 months for follow-up or sooner if worse.      1. Focal epilepsy (HCC)   2. Therapeutic drug monitoring      Patient Instructions  Continue with oxcarbazepine  600 mg twice daily Will check oxcarbazepine  level today, if low will increase. Will check another oxcarbazepine  level monthly until delivery Please contact us  if you after delivery of your baby at that time we will lower the level Return in 6 months or sooner if worse  Per [place] statutes,  patients with seizures are not allowed to drive until they have been seizure-free for six months.  Other recommendations include using caution when using heavy equipment or power tools. Avoid working on ladders or at heights. Take showers instead of baths.  Do not swim alone.  Ensure the water temperature is not too high on the home water heater. Do not go swimming alone. Do not lock yourself in a room alone (i.e. bathroom). When caring for infants or small children, sit down when holding, feeding, or changing them to minimize risk of injury to the child in the event you have a seizure. Maintain good sleep hygiene. Avoid alcohol.  Also recommend adequate sleep, hydration, good diet and minimize stress.   During the Seizure  - First, ensure adequate ventilation and place patients on the floor on their left side  Loosen clothing around the neck and ensure the airway is patent. If the patient is clenching the teeth, do not force the mouth open with any object as this can cause severe damage - Remove all items from the surrounding that can be hazardous. The patient may be oblivious to what's happening and may not even know what he or she is doing. If the patient is confused and wandering, either gently guide him/her away and block access to outside areas - Reassure the individual and be comforting - Call 911. In most cases, the seizure ends before EMS arrives. However, there are cases when seizures may last over 3 to 5 minutes. Or the individual may have developed breathing difficulties or severe injuries. If a pregnant patient or a person with diabetes develops a seizure, it is prudent to call an ambulance. - Finally, if the patient does not regain full consciousness, then call EMS. Most patients will remain confused for about 45 to 90 minutes after a seizure, so you must use judgment in calling for help. - Avoid restraints but make sure the patient is in a bed with padded side rails - Place the individual  in a lateral position with the neck slightly flexed; this will help the saliva drain from the mouth and prevent the tongue from falling backward - Remove all nearby furniture and other hazards from the area - Provide verbal assurance as the individual is regaining consciousness - Provide the patient with privacy if possible - Call for help and start treatment as ordered by the caregiver   After the Seizure (Postictal Stage)  After a seizure, most patients experience confusion, fatigue, muscle pain and/or a headache. Thus, one should permit the individual to sleep. For the next few days, reassurance is essential. Being calm and helping reorient the person is also of importance.  Most seizures are painless and end spontaneously. Seizures are not harmful to others but can lead to complications such as stress on the lungs, brain and the heart. Individuals with prior lung problems may develop labored breathing and respiratory distress.  Discussed Patients with epilepsy have a small risk of sudden unexpected death, a condition referred to as sudden unexpected death in epilepsy (SUDEP). SUDEP is defined specifically as the sudden, unexpected, witnessed or unwitnessed, nontraumatic and nondrowning death in patients with epilepsy with or without evidence for a seizure, and excluding documented status epilepticus, in which post mortem examination does not reveal a structural or toxicologic cause for death     No orders of the defined types were placed in this encounter.   No orders of the defined types were placed in this encounter.   Return in about 6 months (around 04/07/2024).    Pastor Falling, MD 10/09/2023, 9:37 AM  [facility] [address] [place], [place] [address] [phone]

## 2023-10-09 NOTE — Progress Notes (Signed)
 MFM Consult Note  Selena Meyer is currently at 23 weeks and 4 days.  She has been followed due to advanced maternal age (38 years old), chronic hypertension currently treated with labetalol  and nifedipine , and maternal seizure disorder treated with Trileptal .    Her pregnancy has also been complicated by gestational diabetes treated with insulin .  She reports that her fingerstick values have mostly been within normal limits.  Her blood pressure today was 110/56.  Her prior child was born with a posterior fossa abnormality and ventriculomegaly.  She reports that her child is being followed at at [facility] with exams every few months.  That child is doing well today and has not required surgery.  She had a cell free DNA test drawn earlier in her pregnancy which indicated a low risk for trisomy 28, 37, and 13.  A female fetus is predicted.  A low-lying placenta was noted on her prior exam.  Sonographic findings Single intrauterine pregnancy at 23w 4d.  Fetal cardiac activity:  Observed and appears normal. Presentation: Breech. Interval fetal anatomy appears normal. Fetal biometry shows the estimated fetal weight of 1 pound 10 ounces which measures at the 94th percentile. Amniotic fluid volume: Polyhydramnios. MVP: 10.84 cm. Placenta: Posterior.  There are limitations of prenatal ultrasound such as the inability to detect certain abnormalities due to poor visualization. Various factors such as fetal position, gestational age and maternal body habitus may increase the difficulty in visualizing the fetal anatomy.    The previously noted low-lying placenta has resolved.  She may attempt a vaginal delivery at term.  Due to chronic hypertension and gestational diabetes, we will continue to follow her with monthly growth ultrasounds.  We will start weekly fetal testing at 32 weeks.  Due to her age, chronic hypertension, and gestational diabetes, delivery may be considered at  around 37 weeks.    She should continue taking 2 tablets of baby aspirin  daily for preeclampsia prophylaxis.    A follow-up exam was scheduled in 4 weeks.    The patient stated that all of her questions were answered today.  A total of 20 minutes was spent counseling and coordinating the care for this patient.  Greater than 50% of the time was spent in direct face-to-face contact.

## 2023-10-09 NOTE — Addendum Note (Signed)
 Addended byBETHA GREGG LEK on: 10/09/2023 02:38 PM   Modules accepted: Level of Service

## 2023-10-09 NOTE — Patient Instructions (Signed)
 Continue with oxcarbazepine  600 mg twice daily Will check oxcarbazepine  level today, if low will increase. Will check another oxcarbazepine  level monthly until delivery Please contact us  if you after delivery of your baby at that time we will lower the level Return in 6 months or sooner if worse

## 2023-10-11 ENCOUNTER — Other Ambulatory Visit: Payer: Self-pay | Admitting: Obstetrics and Gynecology

## 2023-10-11 ENCOUNTER — Encounter: Payer: Self-pay | Admitting: Obstetrics and Gynecology

## 2023-10-11 DIAGNOSIS — D509 Iron deficiency anemia, unspecified: Secondary | ICD-10-CM

## 2023-10-11 NOTE — Progress Notes (Signed)
 Orders placed for IV iron per recommendation of heme/onc and pt symptoms of fatigue.   Vina Solian, MD Attending Obstetrician & Gynecologist, [facility] for [facility], [facility]

## 2023-10-12 ENCOUNTER — Encounter (HOSPITAL_COMMUNITY): Payer: Self-pay | Admitting: Family Medicine

## 2023-10-12 ENCOUNTER — Other Ambulatory Visit: Payer: Self-pay

## 2023-10-12 ENCOUNTER — Telehealth: Payer: Self-pay

## 2023-10-12 ENCOUNTER — Encounter: Payer: Self-pay | Admitting: Pharmacist

## 2023-10-12 ENCOUNTER — Inpatient Hospital Stay (HOSPITAL_COMMUNITY)
Admission: AD | Admit: 2023-10-12 | Discharge: 2023-10-12 | Disposition: A | Discharge status: Home / Self Care | Attending: Family Medicine | Admitting: Family Medicine

## 2023-10-12 ENCOUNTER — Ambulatory Visit (INDEPENDENT_AMBULATORY_CARE_PROVIDER_SITE_OTHER): Admitting: Pharmacist

## 2023-10-12 VITALS — BP 99/63 | HR 91

## 2023-10-12 DIAGNOSIS — Z79899 Other long term (current) drug therapy: Secondary | ICD-10-CM | POA: Insufficient documentation

## 2023-10-12 DIAGNOSIS — I10 Essential (primary) hypertension: Secondary | ICD-10-CM | POA: Diagnosis not present

## 2023-10-12 DIAGNOSIS — O10212 Pre-existing hypertensive chronic kidney disease complicating pregnancy, second trimester: Secondary | ICD-10-CM | POA: Diagnosis present

## 2023-10-12 DIAGNOSIS — N182 Chronic kidney disease, stage 2 (mild): Secondary | ICD-10-CM | POA: Insufficient documentation

## 2023-10-12 DIAGNOSIS — O24414 Gestational diabetes mellitus in pregnancy, insulin controlled: Secondary | ICD-10-CM | POA: Diagnosis not present

## 2023-10-12 DIAGNOSIS — I129 Hypertensive chronic kidney disease with stage 1 through stage 4 chronic kidney disease, or unspecified chronic kidney disease: Secondary | ICD-10-CM | POA: Insufficient documentation

## 2023-10-12 DIAGNOSIS — O10919 Unspecified pre-existing hypertension complicating pregnancy, unspecified trimester: Secondary | ICD-10-CM | POA: Diagnosis not present

## 2023-10-12 DIAGNOSIS — O09522 Supervision of elderly multigravida, second trimester: Secondary | ICD-10-CM | POA: Diagnosis not present

## 2023-10-12 DIAGNOSIS — Z3A24 24 weeks gestation of pregnancy: Secondary | ICD-10-CM

## 2023-10-12 DIAGNOSIS — O26892 Other specified pregnancy related conditions, second trimester: Secondary | ICD-10-CM | POA: Diagnosis not present

## 2023-10-12 DIAGNOSIS — O26832 Pregnancy related renal disease, second trimester: Secondary | ICD-10-CM | POA: Insufficient documentation

## 2023-10-12 LAB — URINALYSIS, ROUTINE W REFLEX MICROSCOPIC
Bilirubin Urine: NEGATIVE
Glucose, UA: NEGATIVE mg/dL
Hgb urine dipstick: NEGATIVE
Ketones, ur: NEGATIVE mg/dL
Leukocytes,Ua: NEGATIVE
Nitrite: NEGATIVE
Protein, ur: 100 mg/dL — AB
Specific Gravity, Urine: 1.015 (ref 1.005–1.030)
pH: 6 (ref 5.0–8.0)

## 2023-10-12 LAB — COMPREHENSIVE METABOLIC PANEL WITH GFR
ALT: 13 U/L (ref 0–44)
AST: 15 U/L (ref 15–41)
Albumin: 2.5 g/dL — ABNORMAL LOW (ref 3.5–5.0)
Alkaline Phosphatase: 53 U/L (ref 38–126)
Anion gap: 9 (ref 5–15)
BUN: 9 mg/dL (ref 6–20)
CO2: 21 mmol/L — ABNORMAL LOW (ref 22–32)
Calcium: 8.6 mg/dL — ABNORMAL LOW (ref 8.9–10.3)
Chloride: 105 mmol/L (ref 98–111)
Creatinine, Ser: 1.07 mg/dL — ABNORMAL HIGH (ref 0.44–1.00)
GFR, Estimated: >60 mL/min (ref >60)
Glucose, Bld: 99 mg/dL (ref 70–99)
Potassium: 3.9 mmol/L (ref 3.5–5.1)
Sodium: 135 mmol/L (ref 135–145)
Total Bilirubin: 0.2 mg/dL (ref 0.0–1.2)
Total Protein: 5.9 g/dL — ABNORMAL LOW (ref 6.5–8.1)

## 2023-10-12 LAB — CBC
HCT: 29.6 % — ABNORMAL LOW (ref 36.0–46.0)
Hemoglobin: 9.8 g/dL — ABNORMAL LOW (ref 12.0–15.0)
MCH: 31.2 pg (ref 26.0–34.0)
MCHC: 33.1 g/dL (ref 30.0–36.0)
MCV: 94.3 fL (ref 80.0–100.0)
Platelets: 361 K/uL (ref 150–400)
RBC: 3.14 MIL/uL — ABNORMAL LOW (ref 3.87–5.11)
RDW: 13.4 % (ref 11.5–15.5)
WBC: 12.6 K/uL — ABNORMAL HIGH (ref 4.0–10.5)
nRBC: 0 % (ref 0.0–0.2)

## 2023-10-12 LAB — PROTEIN / CREATININE RATIO, URINE
Creatinine, Urine: 142 mg/dL
Protein Creatinine Ratio: 0.82 mg/mg{creat} — ABNORMAL HIGH (ref 0.00–0.15)
Total Protein, Urine: 116 mg/dL

## 2023-10-12 LAB — OXCARBAZEPINE (TRILEPTAL), SERUM: Oxcarbazepine SerPl-Mcnc: 9 ug/mL — ABNORMAL LOW (ref 10–35)

## 2023-10-12 MED ORDER — LABETALOL HCL 5 MG/ML IV SOLN: 40.0000 mg | INTRAVENOUS | Status: DC | PRN

## 2023-10-12 MED ORDER — LABETALOL HCL 100 MG PO TABS
400.0000 mg | ORAL_TABLET | Freq: Once | ORAL | Status: AC
  Administered 2023-10-12: 400 mg via ORAL
  Filled 2023-10-12: qty 4

## 2023-10-12 MED ORDER — HYDRALAZINE HCL 50 MG PO TABS: 25.0000 mg | ORAL_TABLET | Freq: Three times a day (TID) | ORAL | 3 refills | Status: DC

## 2023-10-12 MED ORDER — LABETALOL HCL 5 MG/ML IV SOLN: 80.0000 mg | INTRAVENOUS | Status: DC | PRN

## 2023-10-12 MED ORDER — LABETALOL HCL 5 MG/ML IV SOLN
20.0000 mg | INTRAVENOUS | Status: DC | PRN
  Filled 2023-10-12: qty 4

## 2023-10-12 MED ORDER — HYDRALAZINE HCL 20 MG/ML IJ SOLN
10.0000 mg | INTRAMUSCULAR | Status: DC | PRN
  Administered 2023-10-12: 10 mg via INTRAVENOUS
  Filled 2023-10-12: qty 1

## 2023-10-12 NOTE — Progress Notes (Signed)
 Patient ID: Selena Meyer                 DOB: 1985-03-11                      MRN: [id number]     HPI: Selena Meyer is a 38 y.o. female referred to Cardio OB clinic. PMH is significant for FSGS, HTN, GDM, HTN, and epilepsy.  Patient presents today for BP recheck.  Slow to stand up from waiting room. Reports dizziness and lightheadedness.   Checked BG before appointment and says it was 114.  Took nifedipine , labetalol  and hydralazine  around 10:30 this morning.  Current HTN meds:  Hydralazine  50mg  BID Nifedipine  60mg  in morning, 90mg  in the evening Labetalol  400mg  TID    Wt Readings from Last 3 Encounters:  10/04/23 200 lb (90.7 kg)  09/21/23 195 lb 6.4 oz (88.6 kg)  09/20/23 199 lb (90.3 kg)   BP Readings from Last 3 Encounters:  10/12/23 99/63  10/09/23 (!) 110/56  10/09/23 132/83   Pulse Readings from Last 3 Encounters:  10/12/23 91  10/09/23 96  10/04/23 (!) 101    Renal function: CrCl cannot be calculated (Patient's most recent lab result is older than the maximum 21 days allowed.).  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    Gestational diabetes    Hypertension    Hypokalemia 11/02/2020   Irregular menstrual cycle    Irregular periods/menstrual cycles 05/03/2017   Will hold off on Provera or other fertility stuff for now, pt advised pregnancy is not recommended until BP under better control at least, plus other workup pen   Polycystic ovaries 11/09/2015   Proteinuria 02/28/2013   Stage 2 chronic kidney disease 04/27/2020   SVT (supraventricular tachycardia) 11/01/2020   Systolic murmur 07/16/2017   Tobacco abuse 02/28/2013   Uncontrolled hypertension  05/10/2017    Current Outpatient Medications on File Prior to Visit  Medication Sig Dispense Refill   Accu-Chek Softclix Lancets lancets 1 each by Other route 4 (four) times daily. 200 each 3   acetaminophen  (TYLENOL ) 500 MG tablet Take 500 mg by mouth as needed.     albuterol  (VENTOLIN  HFA) 108 (90 Base) MCG/ACT inhaler Inhale 2 puffs into the lungs every 6 (six) hours as needed for wheezing or shortness of breath. 1 each 1   aspirin  EC 81 MG tablet Take 2 tablets (162 mg total) by mouth daily. Swallow whole. 60 tablet 11   Blood Glucose Monitoring Suppl (ACCU-CHEK GUIDE) w/Device KIT 1 Device by Does not apply route 4 (four) times daily. 1 kit 0   ferrous sulfate  325 (65 FE) MG tablet Take 1 tablet (325 mg total) by mouth every other day. 100 tablet 3   folic acid  (FOLVITE ) 1 MG tablet Take 3 tablets (3 mg total) by mouth daily. 270 tablet 3   glucose blood test strip Check blood sugars 4x daily 200 each 3   hydrALAZINE  (APRESOLINE ) 50 MG tablet Take 1 tablet (50 mg total) by mouth 3 (three) times daily. 270 tablet 3   insulin  glargine-yfgn (SEMGLEE ) 100 UNIT/ML Pen Inject 10 Units into the skin in the morning. 15 mL 0   labetalol  (NORMODYNE ) 200 MG  tablet Take 2 tablets (400 mg total) by mouth 3 (three) times daily. 540 tablet 3   metoCLOPramide  (REGLAN ) 10 MG tablet Take 1 tablet (10 mg total) by mouth 4 (four) times daily as needed for nausea or vomiting. 30 tablet 2   Midazolam  (NAYZILAM ) 5 MG/0.1ML SOLN Place 5 mg into the nose as needed (For seizure cluster or seizure lasting more than 5 minutes). 2 each 0   NIFEdipine  (ADALAT  CC) 60 MG 24 hr tablet Take 60mg  in the morning and 90 mg in the evening (1 and 1/2 tablets)     ondansetron  (ZOFRAN -ODT) 8 MG disintegrating tablet Take 0.5 tablets (4 mg total) by mouth every 8 (eight) hours as needed for nausea. 30 tablet 2   Oxcarbazepine  (TRILEPTAL ) 300 MG tablet Take 2 tablets (600 mg total) by mouth 2 (two) times daily. 120 tablet 3    Prenatal Vit-Fe Fumarate-FA (MULTIVITAMIN-PRENATAL) 27-0.8 MG TABS tablet Take 1 tablet by mouth daily at 12 noon.     No current facility-administered medications on file prior to visit.    Allergies  Allergen Reactions   Ibuprofen Hives and Rash     Assessment/Plan:  1. Hypertension -  Patient BP low in room and patient is symptomatic. BP 96/61 and rechecked at 99/63.  Patient reports she ate today and has been drinking water.  Gave two bottles of water in room. BP remained low. Recommend ER visit for possible iV fluids. Had patient call family member for transportation.  Selena Meyer, PharmD, BCACP, CDCES, CPP [facility] [address], [place], [place] [address] Phone: [phone]; Fax: [phone] 10/12/2023 3:43 PM

## 2023-10-12 NOTE — Patient Instructions (Addendum)
 Report to ED for evaluation

## 2023-10-12 NOTE — Telephone Encounter (Signed)
 Dr. Cleatus, patient will be scheduled as soon as possible.  Auth Submission: NO AUTH NEEDED Site of care: Site of care: [facility] Payer: [employer] and [employer] medicaid Medication & CPT/J Code(s) submitted: Venofer (Iron Sucrose) J1756 Diagnosis Code:  Route of submission (phone, fax, portal):  Phone # Fax # Auth type: Buy/Bill PB Units/visits requested: 200mg  x 5 doses Reference number:  Approval from: 10/12/23 to 01/09/24

## 2023-10-12 NOTE — MAU Note (Signed)
 Selena Meyer is a 38 y.o. at [redacted]w[redacted]d here in MAU reporting: had an OB appointment today and was dizzy upon arrival. Checked BP 98/63, 98/61 which is lower than her normal. Gave patient water and sent her here for fluids. Pt still currently dizzy. Denies any N/V/D. Took all 3 BP medications this am. Denies any abd pain, LOF or VB. Reports +FM  Onset of complaint: today Pain score: 0 Vitals:   10/12/23 1632  BP: 135/81  Pulse: 84  Resp: 16  Temp: 98.6 F (37 C)  SpO2: 97%     FHT:140

## 2023-10-12 NOTE — MAU Provider Note (Signed)
 History     CSN: [id number]  Arrival date and time: 10/12/23 1552   Event Date/Time   First Provider Initiated Contact with Patient 10/12/23 1727      No chief complaint on file.  HPI  Selena Meyer is a 38 y.o. female [id number] @ [redacted]w[redacted]d with a history of CHTN here in MAU with concerns of low BP readings. She was seen in the office today and had 3 lower than normal BP readings 90's/60's.  She was sent over here for IV fluids and further evaluation. Upon arrival her BP was normal 130/80's and the patient feels ok. She has no HA and No vision changes.   She took her BP medications at 10:30 am which included Labetalol  400 mg, procardia  60 mg and Hydralazine  50 mg; this is her normal regimen.  She reports normal fetal movement. She denies swelling in her feet and hands.   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    Gestational diabetes    Hypertension    Hypokalemia 11/02/2020   Irregular menstrual cycle    Irregular periods/menstrual cycles 05/03/2017   Will hold off on Provera or other fertility stuff for now, pt advised pregnancy is not recommended until BP under better control at least, plus other workup pen   Polycystic ovaries 11/09/2015   Proteinuria 02/28/2013   Stage 2 chronic kidney disease 04/27/2020   SVT (supraventricular tachycardia) 11/01/2020   Systolic murmur 07/16/2017   Tobacco abuse 02/28/2013   Uncontrolled hypertension 05/10/2017    Past Surgical History:  Procedure Laterality  Date   NO PAST SURGERIES      Family History  Problem Relation Age of Onset   Diabetes Mother    High blood pressure Mother    Renal Disease Mother    Diabetes Sister    Cancer Maternal Grandmother    Diabetes Maternal Grandmother    High blood pressure Maternal Grandmother    Cancer Maternal Aunt        breast   Other Father     Social History   Tobacco Use   Smoking status: Former    Current packs/day: 0.00    Average packs/day: 0.3 packs/day for 5.0 years (1.3 ttl pk-yrs)    Types: Cigarettes    Start date: 05/16/2016    Quit date: 05/16/2021    Years since quitting: 2.4   Smokeless tobacco: Never   Tobacco comments:    Quit smoking 05-26-23after her mom passed away  Vaping Use   Vaping status: Never Used  Substance Use Topics   Alcohol use: Not Currently    Comment: occasional use; social drinker   Drug use: No    Allergies:  Allergies  Allergen Reactions   Ibuprofen Hives and Rash  Medications Prior to Admission  Medication Sig Dispense Refill Last Dose/Taking   acetaminophen  (TYLENOL ) 500 MG tablet Take 500 mg by mouth as needed.   10/11/2023   aspirin  EC 81 MG tablet Take 2 tablets (162 mg total) by mouth daily. Swallow whole. 60 tablet 11 10/12/2023   ferrous sulfate  325 (65 FE) MG tablet Take 1 tablet (325 mg total) by mouth every other day. 100 tablet 3 10/12/2023   folic acid  (FOLVITE ) 1 MG tablet Take 3 tablets (3 mg total) by mouth daily. 270 tablet 3 10/11/2023   hydrALAZINE  (APRESOLINE ) 50 MG tablet Take 1 tablet (50 mg total) by mouth 3 (three) times daily. 270 tablet 3 10/12/2023 at 10:30 AM   insulin  glargine-yfgn (SEMGLEE ) 100 UNIT/ML Pen Inject 10 Units into the skin in the morning. 15 mL 0 10/12/2023 at 10:30 AM   labetalol  (NORMODYNE ) 200 MG tablet Take 2 tablets (400 mg total) by mouth 3 (three) times daily. 540 tablet 3 10/12/2023 at 10:30 AM   metoCLOPramide  (REGLAN ) 10 MG tablet Take 1 tablet (10 mg total) by mouth 4 (four) times daily as  needed for nausea or vomiting. 30 tablet 2 Past Week   NIFEdipine  (ADALAT  CC) 60 MG 24 hr tablet Take 60mg  in the morning and 90 mg in the evening (1 and 1/2 tablets)   10/12/2023 at 10:30 AM   ondansetron  (ZOFRAN -ODT) 8 MG disintegrating tablet Take 0.5 tablets (4 mg total) by mouth every 8 (eight) hours as needed for nausea. 30 tablet 2 Past Week   Oxcarbazepine  (TRILEPTAL ) 300 MG tablet Take 2 tablets (600 mg total) by mouth 2 (two) times daily. 120 tablet 3 10/12/2023 at 10:30 AM   Prenatal Vit-Fe Fumarate-FA (MULTIVITAMIN-PRENATAL) 27-0.8 MG TABS tablet Take 1 tablet by mouth daily at 12 noon.   10/12/2023   Accu-Chek Softclix Lancets lancets 1 each by Other route 4 (four) times daily. 200 each 3    albuterol  (VENTOLIN  HFA) 108 (90 Base) MCG/ACT inhaler Inhale 2 puffs into the lungs every 6 (six) hours as needed for wheezing or shortness of breath. 1 each 1 More than a month   Blood Glucose Monitoring Suppl (ACCU-CHEK GUIDE) w/Device KIT 1 Device by Does not apply route 4 (four) times daily. 1 kit 0    glucose blood test strip Check blood sugars 4x daily 200 each 3    Midazolam  (NAYZILAM ) 5 MG/0.1ML SOLN Place 5 mg into the nose as needed (For seizure cluster or seizure lasting more than 5 minutes). 2 each 0    Results for orders placed or performed during the hospital encounter of 10/12/23 (from the past 48 hours)  Urinalysis, Routine w reflex microscopic -Urine, Clean Catch     Status: Abnormal   Collection Time: 10/12/23  4:51 PM  Result Value Ref Range   Color, Urine YELLOW YELLOW   APPearance CLEAR CLEAR   Specific Gravity, Urine 1.015 1.005 - 1.030   pH 6.0 5.0 - 8.0   Glucose, UA NEGATIVE NEGATIVE mg/dL   Hgb urine dipstick NEGATIVE NEGATIVE   Bilirubin Urine NEGATIVE NEGATIVE   Ketones, ur NEGATIVE NEGATIVE mg/dL   Protein, ur 899 (A) NEGATIVE mg/dL   Nitrite NEGATIVE NEGATIVE   Leukocytes,Ua NEGATIVE NEGATIVE   RBC / HPF 0-5 0 - 5 RBC/hpf   WBC, UA 0-5 0 - 5 WBC/hpf   Bacteria,  UA RARE (A) NONE SEEN   Squamous Epithelial / HPF 0-5 0 - 5 /HPF    Comment: Performed at [facility]  Lab, [address] [place], [place] [address]  Protein / creatinine ratio, urine     Status: Abnormal   Collection Time: 10/12/23  5:38 PM  Result Value Ref Range   Creatinine, Urine 142 mg/dL   Total Protein, Urine 116 mg/dL    Comment: NO NORMAL RANGE ESTABLISHED FOR THIS TEST   Protein Creatinine Ratio 0.82 (H) 0.00 - 0.15 mg/mg[Cre]    Comment: Performed at [facility], [address] [place], [place] [address]  CBC     Status: Abnormal   Collection Time: 10/12/23  5:57 PM  Result Value Ref Range   WBC 12.6 (H) 4.0 - 10.5 K/uL   RBC 3.14 (L) 3.87 - 5.11 MIL/uL   Hemoglobin 9.8 (L) 12.0 - 15.0 g/dL   HCT 70.3 (L) 63.9 - 53.9 %   MCV 94.3 80.0 - 100.0 fL   MCH 31.2 26.0 - 34.0 pg   MCHC 33.1 30.0 - 36.0 g/dL   RDW 86.5 88.4 - 84.4 %   Platelets 361 150 - 400 K/uL   nRBC 0.0 0.0 - 0.2 %    Comment: Performed at [facility], [address], [place], [place] [address]  Comprehensive metabolic panel with GFR     Status: Abnormal   Collection Time: 10/12/23  7:40 PM  Result Value Ref Range   Sodium 135 135 - 145 mmol/L   Potassium 3.9 3.5 - 5.1 mmol/L   Chloride 105 98 - 111 mmol/L   CO2 21 (L) 22 - 32 mmol/L   Glucose, Bld 99 70 - 99 mg/dL    Comment: Glucose reference range applies only to samples taken after fasting for at least 8 hours.   BUN 9 6 - 20 mg/dL   Creatinine, Ser 8.92 (H) 0.44 - 1.00 mg/dL   Calcium  8.6 (L) 8.9 - 10.3 mg/dL   Total Protein 5.9 (L) 6.5 - 8.1 g/dL   Albumin 2.5 (L) 3.5 - 5.0 g/dL   AST 15 15 - 41 U/L   ALT 13 0 - 44 U/L   Alkaline Phosphatase 53 38 - 126 U/L   Total Bilirubin 0.2 0.0 - 1.2 mg/dL   GFR, Estimated >39 >39 mL/min    Comment: (NOTE) Calculated using the CKD-EPI Creatinine Equation (2021)    Anion gap 9 5 - 15    Comment: Performed at [facility], [address], [place], [place] [address]    Review  of Systems  Eyes:  Negative for visual disturbance.  Neurological:  Negative for headaches.   Physical Exam   Blood pressure (!) 161/91, pulse 81, temperature 98.6 F (37 C), temperature source Oral, resp. rate 16, height 5' 4 (1.626 m), weight 91.3 kg, last menstrual period 04/27/2023, SpO2 97%, unknown if currently breastfeeding.  Patient Vitals for the past 24 hrs:  BP Temp Temp src Pulse Resp SpO2 Height Weight  10/12/23 2016 136/76 -- -- 84 -- -- -- --  10/12/23 2001 (!) 142/79 -- -- 88 -- -- -- --  10/12/23 1946 138/81 -- -- 85 -- -- -- --  10/12/23 1931 (!) 146/85 -- -- 85 -- -- -- --  10/12/23 1916 (!) 146/81 -- -- 67 -- -- -- --  10/12/23 1901 (!) 146/86 -- -- 29 -- -- -- --  10/12/23 1846 (!) 144/84 -- -- 27 -- -- -- --  10/12/23 1831 (!) 152/82 -- -- 40 -- -- -- --  10/12/23 1815 (!) 151/83 -- -- 67 -- -- -- --  10/12/23 1801 (!) 145/84 -- -- 83 -- -- -- --  10/12/23 1731 (!) 164/91 -- -- 86 -- -- -- --  10/12/23 1716 (!) 161/91 -- -- 81 -- -- -- --  10/12/23 1701 (!) 163/87 -- -- 83 -- -- -- --  10/12/23 1647 (!) 157/87 -- -- 83 -- -- -- --  10/12/23 1632 135/81 98.6 F (37 C) Oral 84 16 97 % 5' 4 (1.626 m) 91.3 kg     Physical Exam Constitutional:      General: She is not in acute distress.    Appearance: Normal appearance. She is not ill-appearing, toxic-appearing or diaphoretic.  Musculoskeletal:        General: Normal range of motion.  Skin:    General: Skin is warm.  Neurological:     Mental Status: She is alert and oriented to person, place, and time.     Deep Tendon Reflexes: Reflexes normal.     Comments: Negative clonus     Fetal Tracing: Baseline: 140 bpm Variability: moderate  Accelerations: 10x10 Decelerations: None  Toco: None  MAU Course  Procedures  MDM  Patient was given her 4:00 dose of labetalol  400 mg PO while in MAU Along with Hydralazine  10 mg IV LR bolus PIH labs  BP now normal. Reviewed patient and BP readings including  labs with Dr. Jayne.  Ok to [employer] home with BP medication changes.   Assessment and Plan   A:  1. Chronic hypertension   2. [redacted] weeks gestation of pregnancy      P:  Dc home  Decrease hydralazine  to 25 mg TID Continue Labetalol  400 mg TID, and Nifedipine  60/90 Call the office on Monday for a BP check.  Return to MAU if symptoms worsen  Antonette Hendricks, Delon FERNS, NP 10/12/2023 9:29 PM

## 2023-10-13 ENCOUNTER — Ambulatory Visit: Payer: Self-pay | Admitting: Neurology

## 2023-10-13 MED ORDER — OXCARBAZEPINE 600 MG PO TABS: 900.0000 mg | ORAL_TABLET | Freq: Two times a day (BID) | ORAL | 2 refills | Status: DC

## 2023-10-17 ENCOUNTER — Ambulatory Visit (INDEPENDENT_AMBULATORY_CARE_PROVIDER_SITE_OTHER): Admitting: Obstetrics & Gynecology

## 2023-10-17 ENCOUNTER — Encounter: Payer: Self-pay | Admitting: Obstetrics & Gynecology

## 2023-10-17 VITALS — BP 103/69 | HR 94 | Wt 199.1 lb

## 2023-10-17 DIAGNOSIS — O09522 Supervision of elderly multigravida, second trimester: Secondary | ICD-10-CM

## 2023-10-17 DIAGNOSIS — O24912 Unspecified diabetes mellitus in pregnancy, second trimester: Secondary | ICD-10-CM

## 2023-10-17 DIAGNOSIS — I471 Supraventricular tachycardia, unspecified: Secondary | ICD-10-CM

## 2023-10-17 DIAGNOSIS — O10919 Unspecified pre-existing hypertension complicating pregnancy, unspecified trimester: Secondary | ICD-10-CM

## 2023-10-17 DIAGNOSIS — O0990 Supervision of high risk pregnancy, unspecified, unspecified trimester: Secondary | ICD-10-CM

## 2023-10-17 DIAGNOSIS — Z3A24 24 weeks gestation of pregnancy: Secondary | ICD-10-CM

## 2023-10-17 DIAGNOSIS — I129 Hypertensive chronic kidney disease with stage 1 through stage 4 chronic kidney disease, or unspecified chronic kidney disease: Secondary | ICD-10-CM

## 2023-10-17 DIAGNOSIS — I517 Cardiomegaly: Secondary | ICD-10-CM | POA: Diagnosis not present

## 2023-10-17 DIAGNOSIS — N051 Unspecified nephritic syndrome with focal and segmental glomerular lesions: Secondary | ICD-10-CM

## 2023-10-17 DIAGNOSIS — G40909 Epilepsy, unspecified, not intractable, without status epilepticus: Secondary | ICD-10-CM

## 2023-10-17 DIAGNOSIS — D509 Iron deficiency anemia, unspecified: Secondary | ICD-10-CM

## 2023-10-17 DIAGNOSIS — O99012 Anemia complicating pregnancy, second trimester: Secondary | ICD-10-CM

## 2023-10-17 MED ORDER — INSULIN GLARGINE-YFGN 100 UNIT/ML ~~LOC~~ SOPN: 12.0000 [IU] | PEN_INJECTOR | Freq: Every morning | SUBCUTANEOUS | Status: DC

## 2023-10-17 NOTE — Progress Notes (Signed)
 PRENATAL VISIT NOTE  Subjective:  Selena Meyer is a 38 y.o. G2P0101 at [redacted]w[redacted]d being seen today for ongoing prenatal care.  She is currently monitored for the following issues for this high-risk pregnancy and has FSGS (focal segmental glomerulosclerosis); Maternal iron deficiency anemia complicating pregnancy; SVT (supraventricular tachycardia); Chronic hypertension; Moderate left ventricular hypertrophy; Supervision of high risk pregnancy, antepartum; Nonintractable epilepsy without status epilepticus (HCC); Hypertensive nephropathy; Proteinuria; Diabetes in pregnancy; and AMA (advanced maternal age) multigravida 35+ on their problem list.  Patient reports no complaints.  Contractions: Not present. Vag. Bleeding: None.  Movement: Present. Denies leaking of fluid.   The following portions of the patient's history were reviewed and updated as appropriate: allergies, current medications, past family history, past medical history, past social history, past surgical history and problem list.   Objective:    Vitals:   10/17/23 1512  BP: 103/69  Pulse: 94  Weight: 199 lb 1.3 oz (90.3 kg)    Fetal Status:  Fetal Heart Rate (bpm): 145   Movement: Present    General: Alert, oriented and cooperative. Patient is in no acute distress.  Skin: Skin is warm and dry. No rash noted.   Cardiovascular: Normal heart rate noted  Respiratory: Normal respiratory effort, no problems with respiration noted  Abdomen: Soft, gravid, appropriate for gestational age.  Pain/Pressure: Absent     Pelvic: Cervical exam deferred        Extremities: Normal range of motion.  Edema: None  Mental Status: Normal mood and affect. Normal behavior. Normal judgment and thought content.   US  MFM OB FOLLOW UP Result Date: 10/09/2023 ----------------------------------------------------------------------  OBSTETRICS REPORT                       (Signed Final 10/09/2023 05:09 pm)  ---------------------------------------------------------------------- Patient Info  ID #:       [id number]                          D.O.B.:  1985-09-05 (38 yrs)(F)  Name:       Selena Meyer                   Visit Date: 10/09/2023 03:46 pm ---------------------------------------------------------------------- Performed By  Attending:        Steffan Keys MD         Ref. Address:     [address]                                                             [place], [place]                                                             [address]  Performed By:     Comer Harrow       [facility]  Referred By:      [facility] ---------------------------------------------------------------------- Orders  #  Description                           Code        Ordered By  1  US  MFM OB FOLLOW UP                   [phone]    BABARA KEYS ----------------------------------------------------------------------  #  Order #                     Accession #                Episode #  1  [id number]                   [id number]                 [id number] ---------------------------------------------------------------------- Indications  Advanced maternal age multigravida 64+,        O67.522  second trimester (38 yrs)  Hypertension - Chronic/Pre-existing            O10.019  (Labetalol  and Nifedipine )  Gestational diabetes in pregnancy, insulin      O24.414  controlled  Epilepsy complicating pregnancy (Trileptal )    O99.950 G40.909  Obesity complicating pregnancy, second         O99.212  trimester (BMI 31)  Short interval between pregnancies, 2nd        O09.892  trimester (delivered 09/29/2022)  [redacted] weeks gestation of pregnancy                Z3A.23  Polyhydramnios, second trimester,               O40.2XX0  antepartum condition or complication,  singleton fetus  Medical complication of pregnancy (SVT, left   O26.90  ventricular hypertrophy, hypertensive  nephropathy)  Poor obstetric history: Previous preterm       O09.219  delivery, antepartum (IOL for pre-E, 36w)  Poor obstetric history: Previous severe        O09.299  preeclampsia  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 Cord Insertion:      Previously seen  Amniotic Fluid  AFI FV:      Polyhydramnios                              Largest Pocket(cm)                              10.84 ---------------------------------------------------------------------- Biometry  BPD:      61.3  mm     G. Age:  24w 6d         87  %    CI:        70.01   %    70 - 86                                                          FL/HC:      18.2   %    18.7 - 20.9  HC:      233.7  mm     G. Age:  25w 3d         92  %    HC/AC:      1.12        1.05 - 1.21  AC:      209.3  mm     G. Age:  25w 3d         91  %    FL/BPD:     69.5   %    71 - 87  FL:       42.6  mm     G. Age:  23w 6d         49  %    FL/AC:      20.4   %    20 - 24  CER:      26.7  mm     G. Age:  24w 0d         80  %  LV:        4.4  mm  CM:        6.6  mm  Est. FW:     745  gm    1 lb 10 oz      94  % ---------------------------------------------------------------------- OB History  Gravidity:    2         Term:   0        Prem:   1        SAB:   0  TOP:          0       Ectopic:  0        Living: 1  ---------------------------------------------------------------------- Gestational Age  LMP:           23w 4d        Date:  04/27/23                  EDD:   02/01/24  U/S Today:     24w 6d  EDD:   01/23/24  Best:          23w 4d     Det. By:  LMP  (04/27/23)          EDD:   02/01/24 ---------------------------------------------------------------------- Targeted Anatomy  Central Nervous System  Calvarium/Cranial V.:  Appears normal         Cereb./Vermis:          Appears normal  Cavum:                 Appears normal         Cisterna Magna:         Appears normal  Lateral Ventricles:    Appears normal         Midline Falx:           Appears normal  Choroid Plexus:        Previously seen  Spine  Cervical:              Previously seen        Sacral:                 Previously seen  Thoracic:              Previously seen        Shape/Curvature:        Appears normal  Lumbar:                Previously seen  Head/Neck  Lips:                  Previously seen        Profile:                Previously seen  Neck:                  Previously seen        Orbits/Eyes:            Previously seen  Nuchal Fold:           Previously seen        Mandible:               Not well visualized  Nasal Bone:            Previously seen        Maxilla:                Not well visualized  Thorax  4 Chamber View:        Appears normal         Interventr. Septum:     Previously seen  Cardiac Rhythm:        Normal                 Cardiac Axis:           Not well visualized  Cardiac Situs:         Previously seen        Diaphragm:              Appears normal  Rt Outflow Tract:      Previously seen        3 Vessel View:          Previously seen  Lt Outflow Tract:      Previously seen        3 V Trachea View:       Previously seen  Aortic Arch:  Previously seen        IVC:                    Previously seen  Ductal Arch:           Previously seen        Crossing:               Previously seen  SVC:                    Previously seen  Abdomen  Ventral Wall:          Previously seen        Lt Kidney:              Appears normal  Cord Insertion:        Previously seen        Rt Kidney:              Appears normal  Situs:                 Previously seen        Bladder:                Appears normal  Stomach:               Appears normal  Extremities  Lt Humerus:            Previously seen        Lt Femur:               Previously seen  Rt Humerus:            Previously seen        Rt Femur:               Previously seen  Lt Forearm:            Previously seen        Lt Lower Leg:           Previously seen  Rt Forearm:            Previously seen        Rt Lower Leg:           Previously seen  Lt Hand:               Previously seen        Lt Foot:                Previously seen  Rt Hand:               Previously seen        Rt Foot:                Nml heel/foot  Other  Umbilical Cord:        Previously seen        Genitalia:              Female prev seen  Comment:     Technically difficult due to maternal habitus and fetal position. ---------------------------------------------------------------------- Cervix Uterus Adnexa  Cervix  Length:            4.5  cm.  Normal appearance by transabdominal scan  Uterus  No abnormality visualized.  Right Ovary  Size(cm)     3.09   x   1.87   x  1.97      Vol(ml): 5.96  Within normal  limits.  Left Ovary  Size(cm)     3.57   x   1.48   x  1.43      Vol(ml): 3.96  Within normal limits.  Cul De Sac  No free fluid seen.  Adnexa  No abnormality visualized ---------------------------------------------------------------------- Comments  Selena Meyer is currently at 23 weeks and 4 days.  She has  been followed due to advanced maternal age (38 years old),  chronic hypertension currently treated with labetalol  and  nifedipine , and maternal seizure disorder treated with  Trileptal .  Her pregnancy has also been complicated by gestational  diabetes treated with insulin .  She reports that her fingerstick   values have mostly been within normal limits.  Her blood pressure today was 110/56.  Her prior child was born with a posterior fossa abnormality  and ventriculomegaly.  She reports that her child is being  followed at at [facility] with exams every  few months.  That child is doing well today and has not  required surgery.  She had a cell free DNA test drawn earlier in her pregnancy  which indicated a low risk for trisomy 44, 13, and 13.  A female  fetus is predicted.  A low-lying placenta was noted on her prior exam.  Sonographic findings  Single intrauterine pregnancy at 23w 4d.  Fetal cardiac activity:  Observed and appears normal.  Presentation: Breech.  Interval fetal anatomy appears normal.  Fetal biometry shows the estimated fetal weight of 1 pound  10 ounces which measures at the 94th percentile.  Amniotic fluid volume: Polyhydramnios. MVP: 10.84 cm.  Placenta: Posterior.  There are limitations of prenatal ultrasound such as the  inability to detect certain abnormalities due to poor  visualization. Various factors such as fetal position,  gestational age and maternal body habitus may increase the  difficulty in visualizing the fetal anatomy.  The previously noted low-lying placenta has resolved.  She  may attempt a vaginal delivery at term.  Due to chronic hypertension and gestational diabetes, we will  continue to follow her with monthly growth ultrasounds.  We will start weekly fetal testing at 32 weeks.  Due to her age, chronic hypertension, and gestational  diabetes, delivery may be considered at around 37 weeks.  She should continue taking 2 tablets of baby aspirin  daily for  preeclampsia prophylaxis.  A follow-up exam was scheduled in 4 weeks.  The patient stated that all of her questions were answered  today.  A total of 20 minutes was spent counseling and coordinating  the care for this patient.  Greater than 50% of the time was  spent in direct face-to-face contact.  ----------------------------------------------------------------------                   Steffan Keys, MD Electronically Signed Final Report   10/09/2023 05:09 pm ----------------------------------------------------------------------    Assessment and Plan:  Pregnancy: G2P0101 at [redacted]w[redacted]d 1. Chronic hypertension during pregnancy, antepartum 2. Moderate left ventricular hypertrophy 3. SVT (supraventricular tachycardia) Stable BP, on Labetalol  400 mg po tid, Nifedipine  60mg /90mg  and Hydralazine  50 mg tid. On ASA 162 mg daily. Followed by Dr. Sheena. Serial growth scans and antenatal testing done as per MFM recommendations. Preeclampsia precautions reviewed.   4. Diabetes mellitus during pregnancy in second trimester, unspecified diabetes mellitus type Did not bring log, reports fasting 80s-90s but elevated sugars after meals mostly in 130s, 150 once and  170 onc.  Increased Semglee  to 12 units qam, will monitor response. Advised to  bring log next visit.   Serial growth scans and antenatal testing done as per MFM recommendations. Fetal ECHO ordered.  Hypo and hyperglycemia precautions reviewed. - Ambulatory referral to Pediatric Cardiology - insulin  glargine-yfgn (SEMGLEE ) 100 UNIT/ML Pen; Inject 12 Units into the skin in the morning.  5. Hypertensive nephropathy 6. FSGS (focal segmental glomerulosclerosis) Followed by [facility] closely.  7. Maternal iron deficiency anemia complicating pregnancy in second trimester On iron infusions  8. Nonintractable epilepsy without status epilepticus, unspecified epilepsy type (HCC) On Trileptal , seen by Neurology on 10/09/23.  9. Multigravida of advanced maternal age in second trimester LR NIPS, will get serial growths and antenatal testing as per MFM protocol.  10. [redacted] weeks gestation of pregnancy 11. Supervision of high risk pregnancy, antepartum (Primary) Medicaid BTS papers signed today, discussed PPBTS procedure in detail, reviewed R/B/A. All  questions answered. Preterm labor symptoms and general obstetric precautions including but not limited to vaginal bleeding, contractions, leaking of fluid and fetal movement were reviewed in detail with the patient. Please refer to After Visit Summary for other counseling recommendations.   Return in about 15 days (around 11/01/2023) for OB visit as scheduled.  Future Appointments  Date Time Provider Department Center  10/19/2023  1:30 PM [facility]CHAIR 7 [facility] None  10/22/2023 10:45 AM [facility] LAB [facility] None  10/22/2023 11:00 AM Tonette Lauraine HERO, PA-C [facility] None  10/26/2023 11:00 AM Tobb, Kardie, DO [facility] H&V  10/26/2023 12:30 PM [facility]-CHAIR 1 [facility] None  11/01/2023  4:10 PM Erik Kieth BROCKS, MD [facility]  11/02/2023 12:00 PM [facility]-CHAIR 5 [facility] None  11/07/2023 11:15 AM [facility] PROVIDER 1 [facility]  11/07/2023 11:30 AM [facility] US4 [facility]  11/09/2023 12:45 PM [facility]-CHAIR 7 [facility] None  11/15/2023  4:15 PM [facility]-EDUCATION [facility]  11/16/2023  1:00 PM [facility]INF-CHAIR 4 [facility] None  05/08/2024  2:45 PM Gregg Lek, MD [facility] None    Gloris Hugger, MD

## 2023-10-19 ENCOUNTER — Other Ambulatory Visit: Payer: Self-pay | Admitting: *Deleted

## 2023-10-19 ENCOUNTER — Ambulatory Visit (INDEPENDENT_AMBULATORY_CARE_PROVIDER_SITE_OTHER)

## 2023-10-19 VITALS — BP 112/73 | HR 92 | Temp 98.1°F | Resp 18 | Ht 64.0 in | Wt 199.8 lb

## 2023-10-19 DIAGNOSIS — Z3A25 25 weeks gestation of pregnancy: Secondary | ICD-10-CM | POA: Diagnosis not present

## 2023-10-19 DIAGNOSIS — D509 Iron deficiency anemia, unspecified: Secondary | ICD-10-CM

## 2023-10-19 DIAGNOSIS — N051 Unspecified nephritic syndrome with focal and segmental glomerular lesions: Secondary | ICD-10-CM

## 2023-10-19 DIAGNOSIS — O99012 Anemia complicating pregnancy, second trimester: Secondary | ICD-10-CM

## 2023-10-19 DIAGNOSIS — D500 Iron deficiency anemia secondary to blood loss (chronic): Secondary | ICD-10-CM

## 2023-10-19 DIAGNOSIS — D649 Anemia, unspecified: Secondary | ICD-10-CM

## 2023-10-19 DIAGNOSIS — Z3A16 16 weeks gestation of pregnancy: Secondary | ICD-10-CM

## 2023-10-19 DIAGNOSIS — O0990 Supervision of high risk pregnancy, unspecified, unspecified trimester: Secondary | ICD-10-CM

## 2023-10-19 MED ORDER — IRON SUCROSE 20 MG/ML IV SOLN
200.0000 mg | Freq: Once | INTRAVENOUS | Status: AC
  Administered 2023-10-19: 200 mg via INTRAVENOUS

## 2023-10-19 NOTE — Progress Notes (Signed)
 Diagnosis: Iron Deficiency Anemia  Provider:  Chilton Greathouse MD  Procedure: IV Push  IV Type: Peripheral, IV Location: R Antecubital  Venofer (Iron Sucrose), Dose: 200 mg  Post Infusion IV Care: Observation period completed and Peripheral IV Discontinued  Discharge: Condition: Good, Destination: Home . AVS Declined  Performed by:  Wyvonne Lenz, RN

## 2023-10-22 ENCOUNTER — Ambulatory Visit: Admitting: Medical Oncology

## 2023-10-22 ENCOUNTER — Inpatient Hospital Stay

## 2023-10-23 ENCOUNTER — Encounter: Payer: Self-pay | Admitting: *Deleted

## 2023-10-24 ENCOUNTER — Inpatient Hospital Stay (HOSPITAL_BASED_OUTPATIENT_CLINIC_OR_DEPARTMENT_OTHER): Admitting: Medical Oncology

## 2023-10-24 ENCOUNTER — Inpatient Hospital Stay: Attending: Medical Oncology

## 2023-10-24 VITALS — BP 127/67 | HR 88 | Temp 98.8°F | Resp 17 | Wt 199.0 lb

## 2023-10-24 DIAGNOSIS — Z3A25 25 weeks gestation of pregnancy: Secondary | ICD-10-CM

## 2023-10-24 DIAGNOSIS — Z3A16 16 weeks gestation of pregnancy: Secondary | ICD-10-CM

## 2023-10-24 DIAGNOSIS — O99012 Anemia complicating pregnancy, second trimester: Secondary | ICD-10-CM

## 2023-10-24 DIAGNOSIS — D500 Iron deficiency anemia secondary to blood loss (chronic): Secondary | ICD-10-CM

## 2023-10-24 DIAGNOSIS — D509 Iron deficiency anemia, unspecified: Secondary | ICD-10-CM | POA: Insufficient documentation

## 2023-10-24 DIAGNOSIS — D649 Anemia, unspecified: Secondary | ICD-10-CM

## 2023-10-24 LAB — IRON AND IRON BINDING CAPACITY (CC-WL,HP ONLY)
Iron: 91 ug/dL (ref 28–170)
Saturation Ratios: 22 % (ref 10.4–31.8)
TIBC: 416 ug/dL (ref 250–450)
UIBC: 325 ug/dL

## 2023-10-24 LAB — CBC WITH DIFFERENTIAL (CANCER CENTER ONLY)
Abs Immature Granulocytes: 0.09 K/uL — ABNORMAL HIGH (ref 0.00–0.07)
Basophils Absolute: 0 K/uL (ref 0.0–0.1)
Basophils Relative: 0 %
Eosinophils Absolute: 0.1 K/uL (ref 0.0–0.5)
Eosinophils Relative: 1 %
HCT: 28.2 % — ABNORMAL LOW (ref 36.0–46.0)
Hemoglobin: 9.5 g/dL — ABNORMAL LOW (ref 12.0–15.0)
Immature Granulocytes: 1 %
Lymphocytes Relative: 14 %
Lymphs Abs: 1.5 K/uL (ref 0.7–4.0)
MCH: 30.9 pg (ref 26.0–34.0)
MCHC: 33.7 g/dL (ref 30.0–36.0)
MCV: 91.9 fL (ref 80.0–100.0)
Monocytes Absolute: 0.8 K/uL (ref 0.1–1.0)
Monocytes Relative: 7 %
Neutro Abs: 8.3 K/uL — ABNORMAL HIGH (ref 1.7–7.7)
Neutrophils Relative %: 77 %
Platelet Count: 329 K/uL (ref 150–400)
RBC: 3.07 MIL/uL — ABNORMAL LOW (ref 3.87–5.11)
RDW: 13.2 % (ref 11.5–15.5)
WBC Count: 10.8 K/uL — ABNORMAL HIGH (ref 4.0–10.5)
nRBC: 0 % (ref 0.0–0.2)

## 2023-10-24 LAB — FERRITIN: Ferritin: 177 ng/mL (ref 11–307)

## 2023-10-24 NOTE — Progress Notes (Signed)
 Hematology and Oncology Follow Up Visit  ASHLEYNICOLE Meyer [id number] [birth year] 38 y.o. 10/24/2023   Principle Diagnosis:  Iron deficiency anemia secondary to heavy cycles     Current Therapy:        IV iron as indicated - feraheme- last given on 01/26/2023 Oral iron- every other day    Interim History:  Selena Meyer is here today for follow-up.  She is currently 25 weeks. She is having a boy. She is seen by [facility] for [facility]. They recently checked labs and have ordered 5 sessions of IV Venofer. She started these weekly sessions last Friday. She is tolerating them well.   No fever, chills, n/v, cough, rash, dizziness, SOB, chest pain, palpitations, abdominal pain or changes in bowel or bladder habits.  Intermittent puffiness in her feet and ankles comes and goes.   There has been no bleeding to her knowledge: denies epistaxis, gingivitis, hemoptysis, hematemesis, hematuria, melena, excessive bruising, blood donation.  No falls or syncope reported.  Appetite and hydration are good. Wt Readings from Last 3 Encounters:  10/24/23 199 lb (90.3 kg)  10/19/23 199 lb 12.8 oz (90.6 kg)  10/17/23 199 lb 1.3 oz (90.3 kg)   ECOG Performance Status: 1 - Symptomatic but completely ambulatory  Medications:  Allergies as of 10/24/2023       Reactions   Ibuprofen Hives, Rash        Medication List        Accurate as of October 24, 2023  3:23 PM. If you have any questions, ask your nurse or doctor.          Accu-Chek Guide w/Device Kit 1 Device by Does not apply route 4 (four) times daily.   Accu-Chek Softclix Lancets lancets 1 each by Other route 4 (four) times daily.   acetaminophen  500 MG tablet Commonly known as: TYLENOL  Take 500 mg by mouth as needed.   albuterol  108 (90 Base) MCG/ACT inhaler Commonly known as: VENTOLIN  HFA Inhale 2 puffs into the lungs every 6 (six) hours as needed for wheezing or shortness of breath.    aspirin  EC 81 MG tablet Take 2 tablets (162 mg total) by mouth daily. Swallow whole.   FeroSul 325 (65 Fe) MG tablet Generic drug: ferrous sulfate  Take 1 tablet (325 mg total) by mouth every other day.   folic acid  1 MG tablet Commonly known as: FOLVITE  Take 3 tablets (3 mg total) by mouth daily.   glucose blood test strip Check blood sugars 4x daily   hydrALAZINE  50 MG tablet Commonly known as: APRESOLINE  Take 0.5 tablets (25 mg total) by mouth 3 (three) times daily.   insulin  glargine-yfgn 100 UNIT/ML Pen Commonly known as: SEMGLEE  Inject 12 Units into the skin in the morning.   labetalol  200 MG tablet Commonly known as: NORMODYNE  Take 2 tablets (400 mg total) by mouth 3 (three) times daily.   metoCLOPramide  10 MG tablet Commonly known as: REGLAN  Take 1 tablet (10 mg total) by mouth 4 (four) times daily as needed for nausea or vomiting.   multivitamin-prenatal 27-0.8 MG Tabs tablet Take 1 tablet by mouth daily at 12 noon.   Nayzilam  5 MG/0.1ML Soln Generic drug: Midazolam  Place 5 mg into the nose as needed (For seizure cluster or seizure lasting more than 5 minutes).   NIFEdipine  60 MG 24 hr tablet Commonly known as: ADALAT  CC Take 60mg  in the morning and 90 mg in the evening (1 and 1/2 tablets)  ondansetron  8 MG disintegrating tablet Commonly known as: ZOFRAN -ODT Take 0.5 tablets (4 mg total) by mouth every 8 (eight) hours as needed for nausea.   oxcarbazepine  600 MG tablet Commonly known as: TRILEPTAL  Take 1.5 tablets (900 mg total) by mouth 2 (two) times daily.        Allergies:  Allergies  Allergen Reactions   Ibuprofen Hives and Rash    Past Medical History, Surgical history, Social history, and Family History were reviewed and updated.  Review of Systems: All other 10 point review of systems is negative.   Physical Exam:  weight is 199 lb (90.3 kg). Her temperature is 98.8 F (37.1 C). Her blood pressure is 127/67 and her pulse is 88. Her  respiration is 17 and oxygen saturation is 99%.   Wt Readings from Last 3 Encounters:  10/24/23 199 lb (90.3 kg)  10/19/23 199 lb 12.8 oz (90.6 kg)  10/17/23 199 lb 1.3 oz (90.3 kg)    Ocular: Sclerae unicteric, pupils equal, round and reactive to light Ear-nose-throat: Oropharynx clear, dentition fair Lymphatic: No cervical or supraclavicular adenopathy Lungs no rales or rhonchi, good excursion bilaterally Heart regular rate and rhythm, no murmur appreciated Abd soft, nontender, positive bowel sounds MSK no focal spinal tenderness, no joint edema Neuro: non-focal, well-oriented, appropriate affect   Lab Results  Component Value Date   WBC 10.8 (H) 10/24/2023   HGB 9.5 (L) 10/24/2023   HCT 28.2 (L) 10/24/2023   MCV 91.9 10/24/2023   PLT 329 10/24/2023   Lab Results  Component Value Date   FERRITIN 17 10/04/2023   IRON 68 10/04/2023   TIBC 368 10/04/2023   UIBC 300 10/04/2023   IRONPCTSAT 18 10/04/2023   Lab Results  Component Value Date   RETICCTPCT 1.3 11/23/2022   RBC 3.07 (L) 10/24/2023   RETICCTABS 63,000 11/08/2020   No results found for: KPAFRELGTCHN, LAMBDASER, KAPLAMBRATIO No results found for: IGGSERUM, IGA, IGMSERUM No results found for: STEPHANY CARLOTA BENSON MARKEL EARLA JOANNIE DOC VICK, SPEI   Chemistry      Component Value Date/Time   NA 135 10/12/2023 1940   NA 135 08/14/2023 1419   K 3.9 10/12/2023 1940   CL 105 10/12/2023 1940   CO2 21 (L) 10/12/2023 1940   BUN 9 10/12/2023 1940   BUN 10 08/14/2023 1419   CREATININE 1.07 (H) 10/12/2023 1940   CREATININE 1.37 (H) 12/16/2021 0857   CREATININE 1.34 (H) 11/15/2021 0000      Component Value Date/Time   CALCIUM  8.6 (L) 10/12/2023 1940   ALKPHOS 53 10/12/2023 1940   AST 15 10/12/2023 1940   AST 13 (L) 12/16/2021 0857   ALT 13 10/12/2023 1940   ALT 13 12/16/2021 0857   BILITOT 0.2 10/12/2023 1940   BILITOT <0.2 08/14/2023 1419   BILITOT 0.4  12/16/2021 0857     Encounter Diagnosis  Name Primary?   Maternal iron deficiency anemia complicating pregnancy in second trimester Yes    Impression and Plan: Selena Meyer is a very pleasant 38 yo African American female with long history of iron deficiency anemia. She is treated with IV Feraheme with her last infusion being on 01/16/2023. She is currently pregnant.   Today her Hgb is 9.5 GYN ordering IV iron.    RTC 3 months APP(Carter per pt request), lab (CBC, iron, ferritin)  Lauraine CHRISTELLA Dais, PA-C 10/15/20253:23 PM

## 2023-10-26 ENCOUNTER — Ambulatory Visit: Attending: Cardiology | Admitting: Cardiology

## 2023-10-26 ENCOUNTER — Ambulatory Visit

## 2023-10-26 ENCOUNTER — Encounter: Payer: Self-pay | Admitting: Cardiology

## 2023-10-26 VITALS — BP 160/100 | HR 92 | Ht 64.0 in | Wt 201.5 lb

## 2023-10-26 DIAGNOSIS — O24415 Gestational diabetes mellitus in pregnancy, controlled by oral hypoglycemic drugs: Secondary | ICD-10-CM | POA: Diagnosis not present

## 2023-10-26 DIAGNOSIS — Z3A26 26 weeks gestation of pregnancy: Secondary | ICD-10-CM

## 2023-10-26 DIAGNOSIS — O10919 Unspecified pre-existing hypertension complicating pregnancy, unspecified trimester: Secondary | ICD-10-CM | POA: Diagnosis not present

## 2023-10-26 MED ORDER — HYDRALAZINE HCL 50 MG PO TABS: 50.0000 mg | ORAL_TABLET | Freq: Three times a day (TID) | ORAL | Status: DC

## 2023-10-26 MED ORDER — IRON SUCROSE 20 MG/ML IV SOLN: 200.0000 mg | Freq: Once | INTRAVENOUS | Status: DC

## 2023-10-26 NOTE — Patient Instructions (Signed)
 Medication Instructions:   Increase Hydralazine  50 mg  three times a  daily   *If you need a refill on your cardiac medications before your next appointment, please call your pharmacy*   Lab Work: Not needed If you have labs (blood work) drawn today and your tests are completely normal, you will receive your results only by: MyChart Message (if you have MyChart) OR A paper copy in the mail If you have any lab test that is abnormal or we need to change your treatment, we will call you to review the results.   Testing/Procedures: No not needed   Follow-Up: At [facility], you and your health needs are our priority.  As part of our continuing mission to provide you with exceptional heart care, we have created designated Provider Care Teams.  These Care Teams include your primary Cardiologist (physician) and Advanced Practice Providers (APPs -  Physician Assistants and Nurse Practitioners) who all work together to provide you with the care you need, when you need it.     Your next appointment:   1 week(s)  The format for your next appointment:   In Person  Provider:   Kardie Tobb, DO

## 2023-10-27 NOTE — Progress Notes (Signed)
 Cardio-Obstetrics Clinic  Follow Up Note   Date:  10/27/2023   ID:  Selena Meyer, DOB 16-Jan-1985, MRN [id number]  PCP:  Robbin Loughmiller, DO   [facility] Providers Cardiologist:  Amyrie Illingworth, DO  Electrophysiologist:  None        Referring MD: Willo Mini, NP   Chief Complaint:  I am doing well  History of Present Illness:    Selena Meyer is a 38 y.o. female [G2P0101] who returns for follow up for postpartum cardiovascular care.    Medical includes SVT, Hypertension, hypertriglyceridemia, FSGS, gestational diabetes and seizures.   At her last visit she was hypotensive, we sent to the MAU. Her medication was adjusted. '  Today she reports that she has been getting iron transfusion. No other complaints a this time.    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    Gestational diabetes    Hypertension    Hypokalemia 11/02/2020   Irregular menstrual cycle    Irregular periods/menstrual cycles 05/03/2017   Polycystic ovaries 11/09/2015   Proteinuria 02/28/2013   Stage 2 chronic kidney disease 04/27/2020   SVT (supraventricular tachycardia) 11/01/2020   Systolic murmur 07/16/2017   Tobacco abuse 02/28/2013   Uncontrolled hypertension 05/10/2017    Past Surgical History:  Procedure Laterality Date   NO PAST SURGERIES        OB History     Gravida  2   Para  1   Term      Preterm  1   AB      Living  1      SAB      IAB      Ectopic      Multiple  0   Live Births  1               Current Medications: Current Meds  Medication Sig   Accu-Chek  Softclix Lancets lancets 1 each by Other route 4 (four) times daily.   acetaminophen  (TYLENOL ) 500 MG tablet Take 500 mg by mouth as needed.   albuterol  (VENTOLIN  HFA) 108 (90 Base) MCG/ACT inhaler Inhale 2 puffs into the lungs every 6 (six) hours as needed for wheezing or shortness of breath.   aspirin  EC 81 MG tablet Take 2 tablets (162 mg total) by mouth daily. Swallow whole.   Blood Glucose Monitoring Suppl (ACCU-CHEK GUIDE) w/Device KIT 1 Device by Does not apply route 4 (four) times daily.   ferrous sulfate  325 (65 FE) MG tablet Take 1 tablet (325 mg total) by mouth every other day.   folic acid  (FOLVITE ) 1 MG tablet Take 3 tablets (3 mg total) by mouth daily.   glucose blood test strip Check blood sugars 4x daily   insulin  glargine-yfgn (SEMGLEE ) 100 UNIT/ML Pen Inject 12 Units into the skin in the morning.   labetalol  (NORMODYNE ) 200 MG tablet Take 2 tablets (400 mg total) by mouth 3 (three) times daily.   metoCLOPramide  (REGLAN ) 10 MG tablet Take 1 tablet (10 mg total) by mouth 4 (four) times daily as needed for nausea or vomiting.   Midazolam  (NAYZILAM ) 5 MG/0.1ML SOLN Place 5 mg into  the nose as needed (For seizure cluster or seizure lasting more than 5 minutes).   NIFEdipine  (ADALAT  CC) 60 MG 24 hr tablet Take 60mg  in the morning and 90 mg in the evening (1 and 1/2 tablets)   ondansetron  (ZOFRAN -ODT) 8 MG disintegrating tablet Take 0.5 tablets (4 mg total) by mouth every 8 (eight) hours as needed for nausea.   oxcarbazepine  (TRILEPTAL ) 600 MG tablet Take 1.5 tablets (900 mg total) by mouth 2 (two) times daily.   Prenatal Vit-Fe Fumarate-FA (MULTIVITAMIN-PRENATAL) 27-0.8 MG TABS tablet Take 1 tablet by mouth daily at 12 noon.   [DISCONTINUED] hydrALAZINE  (APRESOLINE ) 50 MG tablet Take 0.5 tablets (25 mg total) by mouth 3 (three) times daily.     Allergies:   Ibuprofen   Social History   Socioeconomic History   Marital status: Married    Spouse name: Marsha   Number of children: 0    Years of education: Not on file   Highest education level: Not on file  Occupational History    Employer: UNEMPLOYED  Tobacco Use   Smoking status: Former    Current packs/day: 0.00    Average packs/day: 0.3 packs/day for 5.0 years (1.3 ttl pk-yrs)    Types: Cigarettes    Start date: 05/16/2016    Quit date: 05/16/2021    Years since quitting: 2.4   Smokeless tobacco: Never   Tobacco comments:    Quit smoking 06-02-23after her mom passed away  Vaping Use   Vaping status: Never Used  Substance and Sexual Activity   Alcohol use: Not Currently    Comment: occasional use; social drinker   Drug use: No   Sexual activity: Yes    Partners: Male    Birth control/protection: None    Comment: Pregnant  Other Topics Concern   Not on file  Social History Narrative   Not on file   Social Drivers of Health   Financial Resource Strain: Low Risk  (05/08/2022)   Received from [facility]   Overall Financial Resource Strain (CARDIA)    Difficulty of Paying Living Expenses: Not very hard  Food Insecurity: No Food Insecurity (09/28/2022)   Hunger Vital Sign    Worried About Running Out of Food in the Last Year: Never true    Ran Out of Food in the Last Year: Never true  Transportation Needs: No Transportation Needs (09/28/2022)   PRAPARE - Administrator, Civil Service (Medical): No    Lack of Transportation (Non-Medical): No  Physical Activity: Unknown (11/03/2020)   Received from [facility]   Exercise Vital Sign    On average, how many days per week do you engage in moderate to strenuous exercise (like a brisk walk)?: 5 days    On average, how many minutes do you engage in exercise at this level?: Patient declined  Stress: Stress Concern Present (11/03/2020)   Received from [facility] - Occupational Stress Questionnaire    Feeling of Stress : To some extent  Social Connections: Unknown (05/10/2021)   Received from [facility]   Social Network    Social Network: Not on file      Family History  Problem Relation Age of Onset   Diabetes Mother    High blood pressure Mother    Renal Disease Mother    Diabetes Sister    Cancer Maternal Grandmother    Diabetes Maternal Grandmother    High blood pressure Maternal Grandmother  Cancer Maternal Aunt        breast   Other Father       ROS:   Please see the history of present illness.     All other systems reviewed and are negative.   Labs/EKG Reviewed:    EKG:   EKG is was not ordered today.    Recent Labs: 12/10/2022: Magnesium  1.9 10/04/2023: TSH 0.894 10/12/2023: ALT 13; BUN 9; Creatinine, Ser 1.07; Potassium 3.9; Sodium 135 10/24/2023: Hemoglobin 9.5; Platelet Count 329   Recent Lipid Panel Lab Results  Component Value Date/Time   CHOL 240 (H) 11/15/2021 12:00 AM   TRIG 492 (H) 11/15/2021 12:00 AM   HDL 40 (L) 11/15/2021 12:00 AM   CHOLHDL 6.0 (H) 11/15/2021 12:00 AM   LDLCALC  11/15/2021 12:00 AM     Comment:     . LDL cholesterol not calculated. Triglyceride levels greater than 400 mg/dL invalidate calculated LDL results. . Reference range: <100 . Desirable range <100 mg/dL for primary prevention;   <70 mg/dL for patients with CHD or diabetic patients  with > or = 2 CHD risk factors. SABRA LDL-C is now calculated using the Martin-Hopkins  calculation, which is a validated novel method providing  better accuracy than the Friedewald equation in the  estimation of LDL-C.  Gladis APPLETHWAITE et al. SANDREA. 7986;689(80): 2061-2068  (http://education.[employer].com/faq/FAQ164)     Physical Exam:    VS:  BP (!) 160/100 (BP Location: Left Arm, Patient Position: Sitting, Cuff Size: Large)   Pulse 92   Ht 5' 4 (1.626 m)   Wt 201 lb 8 oz (91.4 kg)   LMP 04/27/2023 (Exact Date)   SpO2 97%   BMI 34.59 kg/m     Wt Readings from Last 3 Encounters:  10/26/23 201 lb 8 oz (91.4 kg)  10/24/23 199 lb (90.3 kg)  10/19/23 199 lb 12.8 oz  (90.6 kg)     GEN:  Well nourished, well developed in no acute distress HEENT: Normal NECK: No JVD; No carotid bruits LYMPHATICS: No lymphadenopathy CARDIAC: RRR, no murmurs, rubs, gallops RESPIRATORY:  Clear to auscultation without rales, wheezing or rhonchi  ABDOMEN: Soft, non-tender, non-distended MUSCULOSKELETAL:  No edema; No deformity  SKIN: Warm and dry NEUROLOGIC:  Alert and oriented x 3 PSYCHIATRIC:  Normal affect    Risk Assessment/Risk Calculators:                  ASSESSMENT & PLAN:    Chronic hypertension in pregnancy FSGC Seizure Disorder CKD Hx of gestational diabetes  Her blood pressure is not at target. She is currently on Nifedipine  60 mg in the am and 90 mg in the evening, then Labetalol  400 mg TID.  I will increase the hydralazine  back to 50 mg TID.   - will defer to our OBGYN and MFM for plan delivery around 36-38 weeks due to history of severe preeclampsia. Continue Asprin for preeclampsia prophylaxis   She needs a close follow-up.  Will see the patient in 1 week.   Patient Instructions  Medication Instructions:   Increase Hydralazine  50 mg  three times a  daily   *If you need a refill on your cardiac medications before your next appointment, please call your pharmacy*   Lab Work: Not needed If you have labs (blood work) drawn today and your tests are completely normal, you will receive your results only by: MyChart Message (if you have MyChart) OR A paper copy in the mail If you have any lab test  that is abnormal or we need to change your treatment, we will call you to review the results.   Testing/Procedures: No not needed   Follow-Up: At [facility], you and your health needs are our priority.  As part of our continuing mission to provide you with exceptional heart care, we have created designated Provider Care Teams.  These Care Teams include your primary Cardiologist (physician) and Advanced Practice Providers (APPs -   Physician Assistants and Nurse Practitioners) who all work together to provide you with the care you need, when you need it.     Your next appointment:   1 week(s)  The format for your next appointment:   In Person  Provider:   Jaqwon Manfred, DO      Dispo:  Return in about 1 week (around 11/02/2023).   Medication Adjustments/Labs and Tests Ordered: Current medicines are reviewed at length with the patient today.  Concerns regarding medicines are outlined above.  Tests Ordered: No orders of the defined types were placed in this encounter.  Medication Changes: Meds ordered this encounter  Medications   hydrALAZINE  (APRESOLINE ) 50 MG tablet    Sig: Take 1 tablet (50 mg total) by mouth 3 (three) times daily.

## 2023-10-29 ENCOUNTER — Encounter: Payer: Self-pay | Admitting: Obstetrics and Gynecology

## 2023-10-29 ENCOUNTER — Encounter: Payer: Self-pay | Admitting: Family

## 2023-10-31 ENCOUNTER — Encounter: Payer: Self-pay | Admitting: *Deleted

## 2023-11-01 ENCOUNTER — Encounter: Payer: Self-pay | Admitting: *Deleted

## 2023-11-01 ENCOUNTER — Encounter: Payer: Self-pay | Admitting: Cardiology

## 2023-11-01 ENCOUNTER — Ambulatory Visit: Admitting: Obstetrics and Gynecology

## 2023-11-01 ENCOUNTER — Encounter: Payer: Self-pay | Admitting: Obstetrics and Gynecology

## 2023-11-01 ENCOUNTER — Ambulatory Visit
Admission: EM | Admit: 2023-11-01 | Discharge: 2023-11-01 | Disposition: A | Discharge status: Home / Self Care | Attending: Family Medicine | Admitting: Family Medicine

## 2023-11-01 VITALS — BP 136/84 | HR 93 | Wt 199.1 lb

## 2023-11-01 DIAGNOSIS — J111 Influenza due to unidentified influenza virus with other respiratory manifestations: Secondary | ICD-10-CM

## 2023-11-01 DIAGNOSIS — N051 Unspecified nephritic syndrome with focal and segmental glomerular lesions: Secondary | ICD-10-CM | POA: Diagnosis not present

## 2023-11-01 DIAGNOSIS — J020 Streptococcal pharyngitis: Secondary | ICD-10-CM

## 2023-11-01 DIAGNOSIS — G40909 Epilepsy, unspecified, not intractable, without status epilepticus: Secondary | ICD-10-CM | POA: Diagnosis not present

## 2023-11-01 DIAGNOSIS — O24912 Unspecified diabetes mellitus in pregnancy, second trimester: Secondary | ICD-10-CM

## 2023-11-01 DIAGNOSIS — O99012 Anemia complicating pregnancy, second trimester: Secondary | ICD-10-CM

## 2023-11-01 DIAGNOSIS — I10 Essential (primary) hypertension: Secondary | ICD-10-CM

## 2023-11-01 DIAGNOSIS — D509 Iron deficiency anemia, unspecified: Secondary | ICD-10-CM

## 2023-11-01 LAB — POC SOFIA SARS ANTIGEN FIA: SARS Coronavirus 2 Ag: NEGATIVE

## 2023-11-01 LAB — POCT INFLUENZA A/B
Influenza A, POC: NEGATIVE
Influenza B, POC: NEGATIVE

## 2023-11-01 LAB — POCT RAPID STREP A (OFFICE): Rapid Strep A Screen: POSITIVE — AB

## 2023-11-01 MED ORDER — AMOXICILLIN 875 MG PO TABS: 875.0000 mg | ORAL_TABLET | Freq: Two times a day (BID) | ORAL | 0 refills | Status: AC

## 2023-11-01 NOTE — ED Provider Notes (Signed)
 [facility]    CSN: [id number] Arrival date & time: 11/01/23  1731      History   Chief Complaint Chief Complaint  Patient presents with   influenza like symptoms    HPI Selena Meyer is a 38 y.o. female.   HPI pleasant 38 year old female presents with influenza-like symptoms (sore throat cough and congestion for 4 days).  PMH significant for HTN, gestational diabetes, and anemia.  Patient is currently [redacted] weeks pregnant with an estimated due date of 02/01/2024.  Patient reports is using numbing cough drops for pain.  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    Gestational diabetes    Hypertension    Hypokalemia 11/02/2020   Irregular menstrual cycle    Irregular periods/menstrual cycles 05/03/2017   Polycystic ovaries 11/09/2015   Proteinuria 02/28/2013   Stage 2 chronic kidney disease 04/27/2020   SVT (supraventricular tachycardia) 11/01/2020   Systolic murmur 07/16/2017   Tobacco abuse 02/28/2013   Uncontrolled hypertension 05/10/2017    Patient Active Problem List   Diagnosis Date Noted   AMA (advanced maternal age) multigravida 35+ 08/22/2023   Diabetes in pregnancy 08/21/2023   Hypertensive nephropathy 08/10/2023   Proteinuria 08/10/2023   Nonintractable epilepsy without status epilepticus (HCC) 07/28/2023   Supervision of high risk pregnancy, antepartum 07/25/2023   Moderate left ventricular hypertrophy 05/19/2022   Chronic hypertension 05/09/2022   SVT (supraventricular tachycardia) 11/01/2020   Maternal iron deficiency anemia complicating pregnancy 04/27/2020   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 Authorizing Provider  amoxicillin  (AMOXIL ) 875 MG tablet Take 1 tablet (875 mg total) by mouth 2 (two) times daily for 7 days. 11/01/23 11/08/23 Yes Teddy Sharper, FNP  Accu-Chek Softclix Lancets lancets 1 each by Other route 4 (four) times daily. 08/22/23   Nicholaus Burnard HERO, MD  acetaminophen  (TYLENOL ) 500 MG tablet Take 500 mg by mouth as needed.    [provider]  albuterol  (VENTOLIN  HFA) 108 (90 Base) MCG/ACT inhaler Inhale 2 puffs into the lungs every 6 (six) hours as needed for wheezing or shortness of breath. 04/20/21   Willo Mini, NP  aspirin  EC 81 MG tablet Take 2 tablets (162 mg total) by mouth daily. Swallow whole. 07/25/23   Erik Kieth BROCKS, MD  Blood Glucose Monitoring Suppl (ACCU-CHEK GUIDE) w/Device KIT 1 Device by Does not apply route 4 (four) times daily. 08/22/23   Nicholaus Burnard HERO, MD  ferrous sulfate  325 (65 FE) MG tablet Take 1 tablet (325 mg total) by mouth every other day. 10/02/22   Fredirick Merle  S, MD  folic acid  (FOLVITE ) 1 MG tablet Take 3 tablets (3 mg total) by mouth daily. 07/25/23   Erik Kieth BROCKS, MD  glucose blood test strip Check blood sugars 4x daily 08/22/23   Nicholaus Burnard HERO, MD  hydrALAZINE  (APRESOLINE ) 50 MG tablet Take 1 tablet (50 mg total) by mouth 3 (three) times daily. 10/26/23 01/24/24  Tobb, Kardie, DO  insulin  glargine-yfgn (SEMGLEE ) 100 UNIT/ML Pen Inject 12 Units into the skin in the morning. 10/17/23   Anyanwu, Ugonna A, MD  labetalol  (NORMODYNE ) 200 MG tablet Take 2 tablets (400 mg total) by mouth 3 (three) times daily. 09/24/23   Tobb, Kardie, DO  metoCLOPramide  (REGLAN ) 10 MG tablet Take 1 tablet (10 mg total)  by mouth 4 (four) times daily as needed for nausea or vomiting. 07/25/23   Erik Kieth BROCKS, MD  Midazolam  (NAYZILAM ) 5 MG/0.1ML SOLN Place 5 mg into the nose as needed (For seizure cluster or seizure lasting more than 5 minutes). 01/17/23   Camara, Amadou, MD  NIFEdipine  (ADALAT  CC) 60 MG 24 hr tablet Take 60mg  in the morning and 90 mg in the evening (1 and 1/2 tablets) 08/10/23   Tobb, Kardie, DO  ondansetron  (ZOFRAN -ODT) 8 MG disintegrating tablet Take 0.5 tablets (4 mg total) by mouth every 8 (eight) hours as needed for nausea. 09/20/23   Erik Kieth BROCKS, MD  oxcarbazepine  (TRILEPTAL ) 600 MG tablet Take 1.5 tablets (900 mg total) by mouth 2 (two) times daily. 10/13/23 01/11/24  Gregg Lek, MD  Prenatal Vit-Fe Fumarate-FA (MULTIVITAMIN-PRENATAL) 27-0.8 MG TABS tablet Take 1 tablet by mouth daily at 12 noon.    [provider]    Family History Family History  Problem Relation Age of Onset   Diabetes Mother    High blood pressure Mother    Renal Disease Mother    Diabetes Sister    Cancer Maternal Grandmother    Diabetes Maternal Grandmother    High blood pressure Maternal Grandmother    Cancer Maternal Aunt        breast   Other Father     Social History Social History   Tobacco Use   Smoking status: Former    Current packs/day: 0.00    Average packs/day: 0.3 packs/day for 5.0 years (1.3 ttl pk-yrs)    Types: Cigarettes    Start date: 05/16/2016    Quit date: 05/16/2021    Years since quitting: 2.4   Smokeless tobacco: Never   Tobacco comments:    Quit smoking 14-Jun-2023after her mom passed away  Vaping Use   Vaping status: Never Used  Substance Use Topics   Alcohol use: Not Currently    Comment: occasional use; social drinker   Drug use: No     Allergies   Ibuprofen   Review of Systems Review of Systems  HENT:  Positive for congestion and sore throat.   Respiratory:  Positive for cough.   All other systems reviewed and are negative.    Physical  Exam Triage Vital Signs ED Triage Vitals  Encounter Vitals Group     BP      Girls Systolic BP Percentile      Girls Diastolic BP Percentile      Boys Systolic BP Percentile      Boys Diastolic BP Percentile      Pulse      Resp      Temp      Temp src      SpO2  Weight      Height      Head Circumference      Peak Flow      Pain Score      Pain Loc      Pain Education      Exclude from Growth Chart    No data found.  Updated Vital Signs BP (!) 144/89   Pulse 88   Temp 98.3 F (36.8 C)   Resp 19   LMP 04/27/2023 (Exact Date)   SpO2 98%   Visual Acuity Right Eye Distance:   Left Eye Distance:   Bilateral Distance:    Right Eye Near:   Left Eye Near:    Bilateral Near:     Physical Exam Vitals and nursing note reviewed.  Constitutional:      General: She is not in acute distress.    Appearance: Normal appearance. She is obese. She is not ill-appearing.  HENT:     Head: Normocephalic and atraumatic.     Right Ear: Tympanic membrane, ear canal and external ear normal.     Left Ear: Tympanic membrane, ear canal and external ear normal.     Mouth/Throat:     Mouth: Mucous membranes are moist.     Pharynx: Oropharynx is clear. Uvula midline. Uvula swelling present.     Tonsils: 4+ on the right. 4+ on the left.  Eyes:     Extraocular Movements: Extraocular movements intact.     Conjunctiva/sclera: Conjunctivae normal.     Pupils: Pupils are equal, round, and reactive to light.  Cardiovascular:     Rate and Rhythm: Normal rate and regular rhythm.     Heart sounds: Normal heart sounds. No murmur heard. Pulmonary:     Effort: Pulmonary effort is normal.     Breath sounds: Normal breath sounds. No wheezing, rhonchi or rales.  Musculoskeletal:        General: Normal range of motion.  Skin:    General: Skin is warm and dry.  Neurological:     General: No focal deficit present.     Mental Status: She is alert and oriented to person, place, and time. Mental  status is at baseline.  Psychiatric:        Mood and Affect: Mood normal.        Behavior: Behavior normal.      UC Treatments / Results  Labs (all labs ordered are listed, but only abnormal results are displayed) Labs Reviewed  POCT RAPID STREP A (OFFICE) - Abnormal; Notable for the following components:      Result Value   Rapid Strep A Screen Positive (*)    All other components within normal limits  POCT INFLUENZA A/B  POC SOFIA SARS ANTIGEN FIA    EKG   Radiology No results found.  Procedures Procedures (including critical care time)  Medications Ordered in UC Medications - No data to display  Initial Impression / Assessment and Plan / UC Course  I have reviewed the triage vital signs and the nursing notes.  Pertinent labs & imaging results that were available during my care of the patient were reviewed by me and considered in my medical decision making (see chart for details).     MDM: 1.  Strep pharyngitis-Rx'd amoxicillin  875 mg tablet: Take 1 tablet twice daily x 7 days; 2.  Influenza like illness-COVID-19, influenza A/B both negative this evening, patient. Advised patient take medication as directed with food to completion.  Encouraged increase daily water intake  to 64 ounces per day while taking this medication.  Advised if symptoms worsen and/or unresolved please follow-up with OB, PCP, or here for further evaluation.  Influenza-like illness Final Clinical Impressions(s) / UC Diagnoses   Final diagnoses:  Influenza-like illness  Strep pharyngitis     Discharge Instructions      Advised patient take medication as directed with food to completion.  Encouraged increase daily water intake to 64 ounces per day while taking this medication.  Advised if symptoms worsen and/or unresolved please follow-up with OB, PCP, or here for further evaluation.     ED Prescriptions     Medication Sig Dispense Auth. Provider   amoxicillin  (AMOXIL ) 875 MG tablet Take  1 tablet (875 mg total) by mouth 2 (two) times daily for 7 days. 14 tablet Imir Brumbach, FNP      PDMP not reviewed this encounter.   Teddy Sharper, FNP 11/01/23 1815

## 2023-11-01 NOTE — ED Triage Notes (Addendum)
 Pt presents to [facility] with co sore throat and congestion and cough since monday. Pt has been using numbing cough drops for pain.

## 2023-11-01 NOTE — Progress Notes (Deleted)
 Patient was seen for Gestational Diabetes on *** Start time 1315 and End time 1410  Estimated due date: 02/01/2023; [redacted]w[redacted]d   Clinical: Medications: Current Outpatient Medications:    Accu-Chek Softclix Lancets lancets, 1 each by Other route 4 (four) times daily., Disp: 200 each, Rfl: 3   acetaminophen  (TYLENOL ) 500 MG tablet, Take 500 mg by mouth as needed., Disp: , Rfl:    albuterol  (VENTOLIN  HFA) 108 (90 Base) MCG/ACT inhaler, Inhale 2 puffs into the lungs every 6 (six) hours as needed for wheezing or shortness of breath., Disp: 1 each, Rfl: 1   aspirin  EC 81 MG tablet, Take 2 tablets (162 mg total) by mouth daily. Swallow whole., Disp: 60 tablet, Rfl: 11   Blood Glucose Monitoring Suppl (ACCU-CHEK GUIDE) w/Device KIT, 1 Device by Does not apply route 4 (four) times daily., Disp: 1 kit, Rfl: 0   ferrous sulfate  325 (65 FE) MG tablet, Take 1 tablet (325 mg total) by mouth every other day., Disp: 100 tablet, Rfl: 3   folic acid  (FOLVITE ) 1 MG tablet, Take 3 tablets (3 mg total) by mouth daily., Disp: 270 tablet, Rfl: 3   glucose blood test strip, Check blood sugars 4x daily, Disp: 200 each, Rfl: 3   hydrALAZINE  (APRESOLINE ) 50 MG tablet, Take 1 tablet (50 mg total) by mouth 3 (three) times daily., Disp: , Rfl:    insulin  glargine-yfgn (SEMGLEE ) 100 UNIT/ML Pen, Inject 12 Units into the skin in the morning., Disp: , Rfl:    labetalol  (NORMODYNE ) 200 MG tablet, Take 2 tablets (400 mg total) by mouth 3 (three) times daily., Disp: 540 tablet, Rfl: 3   metoCLOPramide  (REGLAN ) 10 MG tablet, Take 1 tablet (10 mg total) by mouth 4 (four) times daily as needed for nausea or vomiting., Disp: 30 tablet, Rfl: 2   Midazolam  (NAYZILAM ) 5 MG/0.1ML SOLN, Place 5 mg into the nose as needed (For seizure cluster or seizure lasting more than 5 minutes)., Disp: 2 each, Rfl: 0   NIFEdipine  (ADALAT  CC) 60 MG 24 hr tablet, Take 60mg  in the morning and 90 mg in the evening (1 and 1/2 tablets), Disp: , Rfl:     ondansetron  (ZOFRAN -ODT) 8 MG disintegrating tablet, Take 0.5 tablets (4 mg total) by mouth every 8 (eight) hours as needed for nausea., Disp: 30 tablet, Rfl: 2   oxcarbazepine  (TRILEPTAL ) 600 MG tablet, Take 1.5 tablets (900 mg total) by mouth 2 (two) times daily., Disp: 90 tablet, Rfl: 2   Prenatal Vit-Fe Fumarate-FA (MULTIVITAMIN-PRENATAL) 27-0.8 MG TABS tablet, Take 1 tablet by mouth daily at 12 noon., Disp: , Rfl:   Medical History:  Past Medical History:  Diagnosis Date   Anemia    Elevated cholesterol with high triglycerides 04/27/2020   Episodic tension-type headache, not intractable 08/06/2017   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    Gestational diabetes    Hypertension    Hypokalemia 11/02/2020   Irregular menstrual cycle    Irregular periods/menstrual cycles 05/03/2017   Polycystic ovaries 11/09/2015   Proteinuria 02/28/2013   Stage 2 chronic kidney disease 04/27/2020   SVT (supraventricular tachycardia) 11/01/2020   Systolic murmur 07/16/2017   Tobacco abuse 02/28/2013   Uncontrolled hypertension 05/10/2017   Labs: OGTT fasting 82, 1 hour 226, 2 hour 106 on 08/20/2023 Lab Results  Component Value Date   HGBA1C 5.8 (H) 08/14/2023    Dietary and Lifestyle History:  10/8: semglee  12 qd 10/04/2023 : Pt present today with her daughter, meter and log sheet. Pt reports she still struggles with nausea and request a emesis bag today. Pt reports she d/c reglan  d/t constipation. Pt reports she d/c dexcom sensor stating varying reading from finger pricks stressed me out. Pt reports she falls asleep prior to obtaining her 2 hour dinner values and states a goal to set an alarm to test moving forward. Pt reports she administers her basal insulin  each morning around 9 am. Pt reports a willingness to initiate walking in an effort to improve blood  sugars. Pt reports she has been making an effort to decrease sugar sweetened beverage intake from daily to ~12 ounces 3-4 times weekly. RD congratulated Pt on her efforts and encouraged full omission of sugary sweetened beverages. All Pt's questions were answered during this encounter.   Hx: Patient lives with her in-laws.  Her mother-in-law does the shopping and cooking.  She is not employed.   Physical Activity: ADL's Stress:  8 out of 10 /self care includes: sticky notes messages, support from friends and family Sleep: interrupted; on average 6-7 hours nightly   24 hr Recall:  First Meal: eggs, boiled egg, water (86 mg/dL, reported as 2 hour post prandial per Pt) or great grains with cluster and almond, lactose 2% (169 mg/dL, reported as 2 hour post prandial per Pt reports) Snack:  pine apple or berries  Second meal: tuna fish, 2 slices of whole bread, ~ 1 cup berries, dr pepper (140 mg/dL reported as 2 hour post prandial)  Snack:  tuna fish, 2 slices of whole bread, pineapple  Third meal: 10 chicken nuggets, water Snack:  none  Beverages:  water, dr pepper, lactose 2%  NUTRITION INTERVENTION  Nutrition education (E-1) on the following topics:   Initial Follow-up  [x]  []  Definition of Gestational Diabetes [x]  [x]  Why dietary management is important in controlling blood glucose [x]  [x]  Effects each nutrient has on blood glucose levels [x]  [x]  Simple carbohydrates vs complex carbohydrates [x]  [x]  Fluid intake [x]  [x]  Creating a balanced meal plan []  []  Carbohydrate counting  [x]  [x]  When to check blood glucose levels [x]  []  Proper blood glucose monitoring techniques [x]  [x]  Effect of stress and stress reduction techniques  [x]  [x]  Exercise effect on blood glucose levels, appropriate exercise during pregnancy []  []  Importance of limiting caffeine  and abstaining from alcohol and smoking [x]  [x]  Medications used for blood sugar control during pregnancy [x]  [x]  Hypoglycemia and rule  of 15 [x]  []  Postpartum self care   Patient has a meter prior to visit. Patient is instructed to test pre breakfast and 2 hours after each meal.  Patient instructed to monitor glucose levels: QID FBS: 60 - <= 95 mg/dL; 2 hour: <= 879 mg/dL  Patient received handouts: Blood glucose log Snack ideas for diabetes during pregnancy Plate Planner Hypoglycemia rule of 15   Patient will be seen for follow-up: 11/15/2023

## 2023-11-01 NOTE — Discharge Instructions (Addendum)
 Advised patient take medication as directed with food to completion.  Encouraged increase daily water intake to 64 ounces per day while taking this medication.  Advised if symptoms worsen and/or unresolved please follow-up with OB, PCP, or here for further evaluation.

## 2023-11-01 NOTE — Progress Notes (Addendum)
 PRENATAL VISIT NOTE  Subjective:  Selena Meyer is a 38 y.o. G2P0101 at [redacted]w[redacted]d being seen today for ongoing prenatal care.  She is currently monitored for the following issues for this high-risk pregnancy and has FSGS (focal segmental glomerulosclerosis); Maternal iron deficiency anemia complicating pregnancy; SVT (supraventricular tachycardia); Chronic hypertension; Moderate left ventricular hypertrophy; Supervision of high risk pregnancy, antepartum; Nonintractable epilepsy without status epilepticus (HCC); Hypertensive nephropathy; Proteinuria; Diabetes in pregnancy; and AMA (advanced maternal age) multigravida 35+ on their problem list.  Patient reports URI symptoms  Contractions: Irregular Garlan Irving). Vag. Bleeding: None.  Movement: Present. Denies leaking of fluid.   The following portions of the patient's history were reviewed and updated as appropriate: allergies, current medications, past family history, past medical history, past social history, past surgical history and problem list.   Objective:   Vitals:   11/01/23 1654  BP: 136/84  Pulse: 93  Weight: 199 lb 1.3 oz (90.3 kg)   Fetal Status: Fetal Heart Rate (bpm): 150   Movement: Present     General:  Alert, oriented and cooperative. Patient is in no acute distress.  Skin: Skin is warm and dry. No rash noted.   Cardiovascular: Normal heart rate noted  Respiratory: Normal respiratory effort, no problems with respiration noted  Abdomen: Soft, gravid, appropriate for gestational age.  Pain/Pressure: Present (Pelvic pressure at times)      Assessment and Plan:  Pregnancy: G2P0101 at [redacted]w[redacted]d 1. Supervision of high risk pregnancy, antepartum (Primary) 2. [redacted] weeks gestation of pregnancy Third tri labs next visit - will need CBC/HIV/RPR and CMP, trileptal  level Tubal papers resigned Declined flu shot Has URI symptoms - will go to urgent care to r/o flu/covid  3. Chronic hypertension Normotensive today on labetalol  400  TID, nifedipine  60/90, and hydral 50 TID Baseline PC c/w proteinuria, Cr 0.82; remainder WNL ldASA 162mg  Following with Dr. Sheena, saw her 10/17 and next appt is scheduled tomorrow Antenatal testing, serial growth & TOD per MFM - likely 37wk, see GDM problem  4. Diabetes mellitus during pregnancy in second trimester, unspecified diabetes mellitus type 13. LGA, polyhydramnios No BG for review today. Will send via mychart Continue Semglee  12u qAM for now Normal fetal echo Growth 9/30 @ 23/4 745g (94%), AC 91%, MVP 10.8 Next US  10/29  5. FSGS (focal segmental glomerulosclerosis) 6. Hypertensive nephropathy 7. Proteinuria, unspecified type Baseline PC 1713, Cr 0.82 Most recent Cr 1.07 on 10/3 - repeat at 28 weeks Saw nephrology 9/22 with no changes to plan Next appt postpartum 04/08/24  8. Moderate left ventricular hypertrophy 9. SVT (supraventricular tachycardia) (HCC) F/w Tobb, asymptomatic May need maternal echo, sent message   10. Nonintractable epilepsy without status epilepticus, unspecified epilepsy type (HCC) F/w neuro, last seen 9/29 with next appt Last trileptal  level 9 on 9/30 - increased to 900mg  BID Will get level with next set of labs in 2 weeks (28w)  11. Low-lying placenta Resolved 9/30  12. Anemia F/w heme Most recent Hgb 9.5 but iron studies not c/w IDA; plan to keep ferritin at 40+ Has IV iron scheduled Repeat iron studies next visit  Please refer to After Visit Summary for other counseling recommendations.   Future Appointments  Date Time Provider Department Center  11/02/2023 10:00 AM Tobb, Kardie, DO [facility]  11/02/2023 12:00 PM [facility]-CHAIR 5 [facility]IN[facility] None  11/07/2023 11:15 AM [facility] PROVIDER 1 [facility]  11/07/2023 11:30 AM [facility] US4 [facility]  11/09/2023 12:45 PM CHINF-CHAIR 8 CH-IN[facility]M None  11/14/2023  3:10 PM Erik Kieth BROCKS, MD [facility]  11/15/2023  4:15 PM WMC-EDUCATION [facility]  11/16/2023  1:00 PM CHINF-CHAIR  4 CH-IN[facility]M None  11/23/2023 12:30 PM CHINF-CHAIR 4 CH-IN[facility]M None  11/28/2023  3:10 PM Cleatus Moccasin, MD [facility]  02/13/2024  3:00 PM CHCC-[facility] LAB CH[facility]-HP None  02/13/2024  3:15 PM Tonette Lauraine HERO, PA-C CH[facility] None  05/08/2024  2:45 PM Gregg Lek, MD GNA-GNA None   Kieth BROCKS Erik, MD

## 2023-11-02 ENCOUNTER — Telehealth: Payer: Self-pay

## 2023-11-02 ENCOUNTER — Ambulatory Visit

## 2023-11-02 MED ORDER — IRON SUCROSE 20 MG/ML IV SOLN: 200.0000 mg | Freq: Once | INTRAVENOUS | Status: DC

## 2023-11-02 NOTE — Telephone Encounter (Signed)
 Called to check on patient. Has not gotten medication yet but will this morning. No needs right now.

## 2023-11-05 ENCOUNTER — Encounter: Payer: Self-pay | Admitting: Family

## 2023-11-05 ENCOUNTER — Encounter: Payer: Self-pay | Admitting: Obstetrics and Gynecology

## 2023-11-07 ENCOUNTER — Ambulatory Visit: Attending: Obstetrics and Gynecology

## 2023-11-07 ENCOUNTER — Ambulatory Visit (HOSPITAL_BASED_OUTPATIENT_CLINIC_OR_DEPARTMENT_OTHER): Admitting: Obstetrics

## 2023-11-07 ENCOUNTER — Other Ambulatory Visit: Payer: Self-pay | Admitting: *Deleted

## 2023-11-07 VITALS — BP 122/61 | HR 93

## 2023-11-07 DIAGNOSIS — O09522 Supervision of elderly multigravida, second trimester: Secondary | ICD-10-CM | POA: Insufficient documentation

## 2023-11-07 DIAGNOSIS — O24414 Gestational diabetes mellitus in pregnancy, insulin controlled: Secondary | ICD-10-CM | POA: Diagnosis not present

## 2023-11-07 DIAGNOSIS — I10 Essential (primary) hypertension: Secondary | ICD-10-CM

## 2023-11-07 DIAGNOSIS — O10012 Pre-existing essential hypertension complicating pregnancy, second trimester: Secondary | ICD-10-CM

## 2023-11-07 DIAGNOSIS — O99212 Obesity complicating pregnancy, second trimester: Secondary | ICD-10-CM | POA: Insufficient documentation

## 2023-11-07 DIAGNOSIS — E669 Obesity, unspecified: Secondary | ICD-10-CM

## 2023-11-07 DIAGNOSIS — O24912 Unspecified diabetes mellitus in pregnancy, second trimester: Secondary | ICD-10-CM

## 2023-11-07 DIAGNOSIS — O10919 Unspecified pre-existing hypertension complicating pregnancy, unspecified trimester: Secondary | ICD-10-CM | POA: Diagnosis present

## 2023-11-07 DIAGNOSIS — O10912 Unspecified pre-existing hypertension complicating pregnancy, second trimester: Secondary | ICD-10-CM | POA: Diagnosis present

## 2023-11-07 DIAGNOSIS — O402XX0 Polyhydramnios, second trimester, not applicable or unspecified: Secondary | ICD-10-CM

## 2023-11-07 DIAGNOSIS — Z3A27 27 weeks gestation of pregnancy: Secondary | ICD-10-CM

## 2023-11-07 DIAGNOSIS — O409XX0 Polyhydramnios, unspecified trimester, not applicable or unspecified: Secondary | ICD-10-CM

## 2023-11-07 DIAGNOSIS — O09299 Supervision of pregnancy with other poor reproductive or obstetric history, unspecified trimester: Secondary | ICD-10-CM

## 2023-11-07 MED ORDER — PEN NEEDLES 32G X 4 MM MISC: 1.0000 [IU] | Freq: Four times a day (QID) | 1 refills | Status: DC

## 2023-11-07 MED ORDER — NOVOLOG FLEXPEN 100 UNIT/ML ~~LOC~~ SOPN: 4.0000 [IU] | PEN_INJECTOR | Freq: Two times a day (BID) | SUBCUTANEOUS | 11 refills | Status: DC

## 2023-11-08 NOTE — Progress Notes (Signed)
 MFM Consult Note  Selena Meyer is currently at 27 weeks and 5 days.  She has been followed due to advanced maternal age (38 years old), chronic hypertension currently treated with labetalol  and nifedipine , and maternal seizure disorder treated with Trileptal .    Her pregnancy has also been complicated by gestational diabetes treated with insulin .  She reports that her postprandial fingerstick values have been elevated.  She was just started on a short acting insulin  before lunch and dinner 2 days ago.  Her blood pressure today was 122/61.  Her prior child was born with a posterior fossa abnormality and ventriculomegaly.    Sonographic findings Single intrauterine pregnancy at 27w 5d.  Fetal cardiac activity:  Observed and appears normal. Presentation: Breech. Fetal biometry shows the estimated fetal weight of 2 pounds 15 ounces which measuresat the 88th percentile. Amniotic fluid volume: Polyhydramnios. AFI: 31.36 cm. MVP: 9.74 cm. Placenta: Posterior.  Gestational diabetes  She was advised to continue to monitor her fingersticks 4 times daily (fasting and 2 hours after each meal).   She was advised to continue using the regimen of long-acting and short acting insulin  to help her achieve better glycemic control.   She was advised that the polyhydramnios noted today may be a sign that her blood glucose levels are elevated.  Hopefully with better glycemic control, the polyhydramnios will resolve later in her pregnancy.   We will continue to follow her with growth ultrasounds throughout her pregnancy.   Weekly fetal testing should be started at around 32 weeks.   Due to her underlying medical conditions, delivery may be considered at around 37 weeks.    She will return in 4 weeks for a BPP and growth scan.    The patient stated that all of her questions were answered today.  A total of 20 minutes was spent counseling and coordinating the care for this patient.  Greater than 50% of the time  was spent in direct face-to-face contact.

## 2023-11-09 ENCOUNTER — Ambulatory Visit (INDEPENDENT_AMBULATORY_CARE_PROVIDER_SITE_OTHER)

## 2023-11-09 VITALS — BP 128/76 | HR 86 | Temp 98.1°F | Resp 16 | Ht 64.0 in | Wt 197.6 lb

## 2023-11-09 DIAGNOSIS — N051 Unspecified nephritic syndrome with focal and segmental glomerular lesions: Secondary | ICD-10-CM

## 2023-11-09 DIAGNOSIS — D509 Iron deficiency anemia, unspecified: Secondary | ICD-10-CM

## 2023-11-09 DIAGNOSIS — O99012 Anemia complicating pregnancy, second trimester: Secondary | ICD-10-CM

## 2023-11-09 DIAGNOSIS — O0990 Supervision of high risk pregnancy, unspecified, unspecified trimester: Secondary | ICD-10-CM

## 2023-11-09 DIAGNOSIS — Z3A28 28 weeks gestation of pregnancy: Secondary | ICD-10-CM

## 2023-11-09 MED ORDER — IRON SUCROSE 20 MG/ML IV SOLN
200.0000 mg | Freq: Once | INTRAVENOUS | Status: AC
  Administered 2023-11-09: 200 mg via INTRAVENOUS
  Filled 2023-11-09: qty 10

## 2023-11-09 NOTE — Progress Notes (Signed)
 Diagnosis: Acute Anemia  Provider:  Praveen Mannam MD  Procedure: IV Push  IV Type: Peripheral, IV Location: R Antecubital  Venofer  (Iron  Sucrose), Dose: 200 mg  Post Infusion IV Care: Observation period completed  Discharge: Condition: Good, Destination: Home . AVS Declined  Performed by:  Rachelle Bue, RN

## 2023-11-13 ENCOUNTER — Telehealth: Payer: Self-pay | Admitting: *Deleted

## 2023-11-13 NOTE — Telephone Encounter (Signed)
 Current pharmacy does not have medication and does not know when they will have it in. Patient will call other pharmacies and up date office after lunch if needed.

## 2023-11-14 ENCOUNTER — Ambulatory Visit (INDEPENDENT_AMBULATORY_CARE_PROVIDER_SITE_OTHER): Admitting: Obstetrics and Gynecology

## 2023-11-14 VITALS — BP 111/67 | HR 102 | Wt 201.0 lb

## 2023-11-14 DIAGNOSIS — O0990 Supervision of high risk pregnancy, unspecified, unspecified trimester: Secondary | ICD-10-CM

## 2023-11-14 DIAGNOSIS — N051 Unspecified nephritic syndrome with focal and segmental glomerular lesions: Secondary | ICD-10-CM

## 2023-11-14 DIAGNOSIS — I471 Supraventricular tachycardia, unspecified: Secondary | ICD-10-CM

## 2023-11-14 DIAGNOSIS — G40909 Epilepsy, unspecified, not intractable, without status epilepticus: Secondary | ICD-10-CM | POA: Diagnosis not present

## 2023-11-14 DIAGNOSIS — R809 Proteinuria, unspecified: Secondary | ICD-10-CM

## 2023-11-14 DIAGNOSIS — D509 Iron deficiency anemia, unspecified: Secondary | ICD-10-CM

## 2023-11-14 DIAGNOSIS — O09523 Supervision of elderly multigravida, third trimester: Secondary | ICD-10-CM

## 2023-11-14 DIAGNOSIS — Z3A28 28 weeks gestation of pregnancy: Secondary | ICD-10-CM | POA: Diagnosis not present

## 2023-11-14 DIAGNOSIS — O24414 Gestational diabetes mellitus in pregnancy, insulin controlled: Secondary | ICD-10-CM | POA: Diagnosis not present

## 2023-11-14 DIAGNOSIS — I517 Cardiomegaly: Secondary | ICD-10-CM

## 2023-11-14 DIAGNOSIS — O99013 Anemia complicating pregnancy, third trimester: Secondary | ICD-10-CM

## 2023-11-14 DIAGNOSIS — I10 Essential (primary) hypertension: Secondary | ICD-10-CM

## 2023-11-14 DIAGNOSIS — I129 Hypertensive chronic kidney disease with stage 1 through stage 4 chronic kidney disease, or unspecified chronic kidney disease: Secondary | ICD-10-CM

## 2023-11-14 MED ORDER — INSULIN GLARGINE 100 UNIT/ML SOLOSTAR PEN: 12.0000 [IU] | PEN_INJECTOR | Freq: Every day | SUBCUTANEOUS | 2 refills | Status: DC

## 2023-11-14 NOTE — Progress Notes (Signed)
 PRENATAL VISIT NOTE  Subjective:  Selena Meyer is a 38 y.o. G2P0101 at [redacted]w[redacted]d being seen today for ongoing prenatal care.  She is currently monitored for the following issues for this high-risk pregnancy and has FSGS (focal segmental glomerulosclerosis); Maternal iron deficiency anemia complicating pregnancy; SVT (supraventricular tachycardia); Chronic hypertension; Moderate left ventricular hypertrophy; Supervision of high risk pregnancy, antepartum; Nonintractable epilepsy without status epilepticus (HCC); Hypertensive nephropathy; Proteinuria; Diabetes in pregnancy; and AMA (advanced maternal age) multigravida 35+ on their problem list.  Patient reports doing ok overall. Having issues with insulin  rx. Contractions: Not present. Vag. Bleeding: None.  Movement: Present. Denies leaking of fluid.   The following portions of the patient's history were reviewed and updated as appropriate: allergies, current medications, past family history, past medical history, past social history, past surgical history and problem list.   Objective:   Vitals:   11/14/23 1542  BP: 111/67  Pulse: (!) 102  Weight: 201 lb (91.2 kg)   Fetal Status: Fetal Heart Rate (bpm): 150   Movement: Present     General:  Alert, oriented and cooperative. Patient is in no acute distress.  Skin: Skin is warm and dry. No rash noted.   Cardiovascular: Normal heart rate noted  Respiratory: Normal respiratory effort, no problems with respiration noted  Abdomen: Soft, gravid, appropriate for gestational age.  Pain/Pressure: Absent      Assessment and Plan:  Pregnancy: G2P0101 at [redacted]w[redacted]d 1. Supervision of high risk pregnancy, antepartum (Primary) 2. [redacted] weeks gestation of pregnancy CBC, HIV, RPR ordered Tdap next visit?  3. Chronic hypertension Normotensive today on labetalol  400 TID, nifedipine  60/90, and hydral 50 TID Baseline PC c/w proteinuria, Cr 0.82; remainder WNL ldASA 162mg  Following with Dr. Sheena, needs to  reschedule her appointment Antenatal testing, serial growth & TOD per MFM - likely 37wk, see GDM problem  4. Diabetes mellitus during pregnancy in second trimester, unspecified diabetes mellitus type 13. Polyhydramnios - BG all over but fasting still seems normal across the board - Semglee  is on back order across several pharmacies. She also got advice from family not to take mealtime insulin  if BG < 100 so she has been checking those and not taking insulin . Postprandial values appear to be pan elevated for lunch & dinner. Has treated fasting BG in 70s by eating or drinking juice which then gets rebound high levels - Reviewed that her family's diabetes sugar ranges/instructions are going to be different because they are not pregnant. We need much tighter BG control. Will check fasting and 1h PP; can check pre prandial if she wants to be this is not necessary and she should still take insulin  w/ her meal as long as she is planning to eat a full meal and starting BG is normal (>70) - Paper log given to try to help organize blood sugars - Will switch from semglee  to lantus  to see if this is in stock with [facility]. Keep at 12u as she likely needs to go up on dose regardless - Continue novolog  6u with lunch/dinner - Normal fetal echo - Growth 10/29 @ 27/5 1341g (88%), MVP 9.74 - Next US  12/5  5. FSGS (focal segmental glomerulosclerosis) 6. Hypertensive nephropathy 7. Proteinuria, unspecified type Baseline PC 1713, Cr 0.82 Most recent Cr 1.07 on 10/3 - repeat ordered today Saw nephrology 9/22 with no changes to plan Next neph appt postpartum 04/08/24  8. Moderate left ventricular hypertrophy 9. SVT (supraventricular tachycardia) (HCC) F/w Tobb, asymptomatic Discussed echo with Dr. Sheena -  not current indicated  10. Nonintractable epilepsy without status epilepticus, unspecified epilepsy type (HCC) F/w neuro, last seen 9/29 with next appt postpartum Last trileptal  level 9 on 9/30 - increased to  900mg  BID Trileptal  level ordered  12. Anemia F/w heme Most recent Hgb 9.5 but iron studies not c/w IDA; plan to keep ferritin at 40+ Repeat iron studies ordered  Please refer to After Visit Summary for other counseling recommendations.   Future Appointments  Date Time Provider Department Center  11/15/2023  4:15 PM [facility]  11/16/2023  1:00 PM CHINF-CHAIR 4 CH-INFWM None  11/20/2023 11:20 AM Tobb, Kardie, DO [facility] H&V  11/23/2023 12:30 PM CHINF-CHAIR 4 CH-INFWM None  11/28/2023  3:10 PM Cleatus Moccasin, MD [facility]  11/30/2023 12:30 PM CHINF-CHAIR 5 CH-INFWM None  12/14/2023 10:15 AM [facility] PROVIDER 1 [facility]  12/14/2023 10:30 AM [facility] US4 [facility]  12/21/2023 11:15 AM [facility] PROVIDER 1 [facility]  12/21/2023 11:30 AM [facility] US1 [facility]  12/28/2023  1:15 PM W[facility]MFC PROVIDER 1 [facility]FC [facility]  12/28/2023  1:30 PM [facility] US2 [facility]  01/04/2024  2:15 PM W[facility]-M[facility] PROVIDER 1 [facility]FC W[facility]  01/04/2024  2:30 PM [facility] US1 [facility]  02/13/2024  3:00 PM CH[facility]-HP LAB CH[facility]-HP None  02/13/2024  3:15 PM Tonette Lauraine HERO, PA-C CHCC-HP None  05/08/2024  2:45 PM Gregg Lek, MD GNA-GNA None   Kieth JAYSON Carolin, MD

## 2023-11-14 NOTE — Telephone Encounter (Signed)
 Called pt, appt made with Dr. Sheena.

## 2023-11-14 NOTE — Progress Notes (Signed)
 Will talk with you today with concerns.

## 2023-11-15 ENCOUNTER — Other Ambulatory Visit: Payer: Self-pay

## 2023-11-15 DIAGNOSIS — O24912 Unspecified diabetes mellitus in pregnancy, second trimester: Secondary | ICD-10-CM

## 2023-11-16 ENCOUNTER — Encounter: Payer: Self-pay | Admitting: *Deleted

## 2023-11-16 ENCOUNTER — Ambulatory Visit

## 2023-11-16 VITALS — BP 138/79 | HR 91 | Temp 98.0°F | Resp 16 | Ht 64.0 in | Wt 202.0 lb

## 2023-11-16 DIAGNOSIS — O0990 Supervision of high risk pregnancy, unspecified, unspecified trimester: Secondary | ICD-10-CM

## 2023-11-16 DIAGNOSIS — N051 Unspecified nephritic syndrome with focal and segmental glomerular lesions: Secondary | ICD-10-CM

## 2023-11-16 DIAGNOSIS — Z3A29 29 weeks gestation of pregnancy: Secondary | ICD-10-CM

## 2023-11-16 DIAGNOSIS — O99012 Anemia complicating pregnancy, second trimester: Secondary | ICD-10-CM

## 2023-11-16 DIAGNOSIS — D509 Iron deficiency anemia, unspecified: Secondary | ICD-10-CM | POA: Diagnosis not present

## 2023-11-16 LAB — OXCARBAZEPINE (TRILEPTAL), SERUM: Oxcarbazepine SerPl-Mcnc: 23 ug/mL (ref 10–35)

## 2023-11-16 LAB — IRON AND TIBC
Iron Saturation: 20 % (ref 15–55)
Iron: 72 ug/dL (ref 27–159)
Total Iron Binding Capacity: 362 ug/dL (ref 250–450)
UIBC: 290 ug/dL (ref 131–425)

## 2023-11-16 LAB — CMP14+EGFR
ALT: 14 IU/L (ref 0–32)
AST: 17 IU/L (ref 0–40)
Albumin: 3.3 g/dL — ABNORMAL LOW (ref 3.9–4.9)
Alkaline Phosphatase: 73 IU/L (ref 41–116)
BUN/Creatinine Ratio: 8 — ABNORMAL LOW (ref 9–23)
BUN: 9 mg/dL (ref 6–24)
Bilirubin Total: <0.2 mg/dL (ref 0.0–1.2)
CO2: 20 mmol/L (ref 20–29)
Calcium: 8.4 mg/dL — ABNORMAL LOW (ref 8.7–10.2)
Chloride: 104 mmol/L (ref 96–106)
Creatinine, Ser: 1.11 mg/dL — ABNORMAL HIGH (ref 0.57–1.00)
Globulin, Total: 2.6 g/dL (ref 1.5–4.5)
Glucose: 130 mg/dL — ABNORMAL HIGH (ref 70–99)
Potassium: 4.2 mmol/L (ref 3.5–5.2)
Sodium: 139 mmol/L (ref 134–144)
Total Protein: 5.9 g/dL — ABNORMAL LOW (ref 6.0–8.5)
eGFR: 64 mL/min/1.73 (ref >59)

## 2023-11-16 LAB — CBC
Hematocrit: 29.5 % — ABNORMAL LOW (ref 34.0–46.6)
Hemoglobin: 10 g/dL — ABNORMAL LOW (ref 11.1–15.9)
MCH: 32.3 pg (ref 26.6–33.0)
MCHC: 33.9 g/dL (ref 31.5–35.7)
MCV: 95 fL (ref 79–97)
Platelets: 341 x10E3/uL (ref 150–450)
RBC: 3.1 x10E6/uL — ABNORMAL LOW (ref 3.77–5.28)
RDW: 12.9 % (ref 11.7–15.4)
WBC: 11.1 x10E3/uL — ABNORMAL HIGH (ref 3.4–10.8)

## 2023-11-16 LAB — B12 AND FOLATE PANEL
Folate: >20 ng/mL (ref >3.0)
Vitamin B-12: 463 pg/mL (ref 232–1245)

## 2023-11-16 LAB — RPR: RPR Ser Ql: NONREACTIVE

## 2023-11-16 LAB — HIV ANTIBODY (ROUTINE TESTING W REFLEX): HIV Screen 4th Generation wRfx: NONREACTIVE

## 2023-11-16 LAB — FERRITIN: Ferritin: 166 ng/mL — ABNORMAL HIGH (ref 15–150)

## 2023-11-16 MED ORDER — IRON SUCROSE 20 MG/ML IV SOLN
200.0000 mg | Freq: Once | INTRAVENOUS | Status: AC
  Administered 2023-11-16: 200 mg via INTRAVENOUS
  Filled 2023-11-16: qty 10

## 2023-11-16 NOTE — Progress Notes (Signed)
 Diagnosis: Acute Anemia  Provider:  Praveen Mannam MD  Procedure: IV Push  IV Type: Peripheral, IV Location: R Antecubital  Venofer (Iron Sucrose), Dose: 200 mg  Post Infusion IV Care: Observation period completed. 15 min per patient request  Discharge: Condition: Good, Destination: Home . AVS Declined  Performed by:  Eleanor DELENA Bloch, RN

## 2023-11-19 ENCOUNTER — Ambulatory Visit: Payer: Self-pay | Admitting: Obstetrics and Gynecology

## 2023-11-19 DIAGNOSIS — G40909 Epilepsy, unspecified, not intractable, without status epilepticus: Secondary | ICD-10-CM

## 2023-11-19 DIAGNOSIS — O99013 Anemia complicating pregnancy, third trimester: Secondary | ICD-10-CM

## 2023-11-19 DIAGNOSIS — N051 Unspecified nephritic syndrome with focal and segmental glomerular lesions: Secondary | ICD-10-CM

## 2023-11-20 ENCOUNTER — Encounter: Payer: Self-pay | Admitting: Cardiology

## 2023-11-20 ENCOUNTER — Ambulatory Visit: Attending: Cardiology | Admitting: Cardiology

## 2023-11-20 VITALS — BP 108/64 | HR 96 | Ht 64.0 in | Wt 199.8 lb

## 2023-11-20 DIAGNOSIS — O10919 Unspecified pre-existing hypertension complicating pregnancy, unspecified trimester: Secondary | ICD-10-CM

## 2023-11-20 DIAGNOSIS — N051 Unspecified nephritic syndrome with focal and segmental glomerular lesions: Secondary | ICD-10-CM

## 2023-11-20 DIAGNOSIS — Z3A29 29 weeks gestation of pregnancy: Secondary | ICD-10-CM | POA: Diagnosis not present

## 2023-11-20 DIAGNOSIS — Z8632 Personal history of gestational diabetes: Secondary | ICD-10-CM | POA: Diagnosis not present

## 2023-11-20 NOTE — Patient Instructions (Signed)
 Medication Instructions:  Your physician has recommended you make the following change in your medication:  STOP: Hydralazine   *If you need a refill on your cardiac medications before your next appointment, please call your pharmacy*  Follow-Up: At [facility], you and your health needs are our priority.  As part of our continuing mission to provide you with exceptional heart care, our providers are all part of one team.  This team includes your primary Cardiologist (physician) and Advanced Practice Providers or APPs (Physician Assistants and Nurse Practitioners) who all work together to provide you with the care you need, when you need it.  Your next appointment:    [place] 11/18 at 12p  Provider:   Kardie Tobb, DO

## 2023-11-20 NOTE — Progress Notes (Unsigned)
 Cardio-Obstetrics Clinic  Follow Up Note   Date:  11/21/2023   ID:  Selena Meyer, DOB 07/21/1985, MRN [id number]  PCP:  Freddrick Johns   [facility] Providers Cardiologist:  Dub Huntsman, DO  Electrophysiologist:  None        Referring MD: No ref. provider found   Chief Complaint:  I am doing well  History of Present Illness:    Selena Meyer is a 38 y.o. female [G2P0101] who returns for follow up for postpartum cardiovascular care.    Medical includes SVT, Hypertension, hypertriglyceridemia, FSGS, gestational diabetes and seizures.   At her last visit she was hypotensive, we sent to the MAU. Her medication was adjusted. '  Today she reports that she has been getting iron transfusion. No other complaints a this time.    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    Gestational diabetes    Hypertension    Hypokalemia 11/02/2020   Irregular menstrual cycle    Irregular periods/menstrual cycles 05/03/2017   Polycystic ovaries 11/09/2015   Proteinuria 02/28/2013   Stage 2 chronic kidney disease 04/27/2020   SVT (supraventricular tachycardia) 11/01/2020   Systolic murmur 07/16/2017   Tobacco abuse 02/28/2013   Uncontrolled hypertension 05/10/2017    Past Surgical History:  Procedure Laterality Date   NO PAST SURGERIES        OB History     Gravida  2   Para  1   Term      Preterm  1   AB      Living  1      SAB      IAB      Ectopic      Multiple  0   Live Births  1               Current Medications: Current Meds  Medication Sig   Accu-Chek  Softclix Lancets lancets 1 each by Other route 4 (four) times daily.   acetaminophen  (TYLENOL ) 500 MG tablet Take 500 mg by mouth as needed.   albuterol  (VENTOLIN  HFA) 108 (90 Base) MCG/ACT inhaler Inhale 2 puffs into the lungs every 6 (six) hours as needed for wheezing or shortness of breath.   aspirin  EC 81 MG tablet Take 2 tablets (162 mg total) by mouth daily. Swallow whole.   Blood Glucose Monitoring Suppl (ACCU-CHEK GUIDE) w/Device KIT 1 Device by Does not apply route 4 (four) times daily.   ferrous sulfate  325 (65 FE) MG tablet Take 1 tablet (325 mg total) by mouth every other day.   folic acid  (FOLVITE ) 1 MG tablet Take 3 tablets (3 mg total) by mouth daily.   glucose blood test strip Check blood sugars 4x daily   hydrALAZINE  (APRESOLINE ) 50 MG tablet Take 1 tablet (50 mg total) by mouth 3 (three) times daily.   insulin  aspart (NOVOLOG  FLEXPEN) 100 UNIT/ML FlexPen Inject 4 Units into the skin 2 (two) times daily with a meal. With lunch & dinner   insulin  glargine (LANTUS ) 100 UNIT/ML Solostar Pen Inject 12 Units into the skin daily.   Insulin  Pen Needle (PEN NEEDLES) 32G X 4 MM  MISC 1 Units by Does not apply route in the morning, at noon, in the evening, and at bedtime.   labetalol  (NORMODYNE ) 200 MG tablet Take 2 tablets (400 mg total) by mouth 3 (three) times daily.   metoCLOPramide  (REGLAN ) 10 MG tablet Take 1 tablet (10 mg total) by mouth 4 (four) times daily as needed for nausea or vomiting.   Midazolam  (NAYZILAM ) 5 MG/0.1ML SOLN Place 5 mg into the nose as needed (For seizure cluster or seizure lasting more than 5 minutes).   NIFEdipine  (ADALAT  CC) 60 MG 24 hr tablet Take 60mg  in the morning and 90 mg in the evening (1 and 1/2 tablets)   ondansetron  (ZOFRAN -ODT) 8 MG disintegrating tablet Take 0.5 tablets (4 mg total) by mouth every 8 (eight) hours as needed for nausea.   oxcarbazepine  (TRILEPTAL ) 600 MG tablet Take 1.5 tablets (900 mg total) by mouth 2 (two) times daily.   Prenatal  Vit-Fe Fumarate-FA (MULTIVITAMIN-PRENATAL) 27-0.8 MG TABS tablet Take 1 tablet by mouth daily at 12 noon.     Allergies:   Ibuprofen   Social History   Socioeconomic History   Marital status: Married    Spouse name: Marsha   Number of children: 0   Years of education: Not on file   Highest education level: Not on file  Occupational History    Employer: UNEMPLOYED  Tobacco Use   Smoking status: Former    Current packs/day: 0.00    Average packs/day: 0.3 packs/day for 5.0 years (1.3 ttl pk-yrs)    Types: Cigarettes    Start date: 05/16/2016    Quit date: 05/16/2021    Years since quitting: 2.5   Smokeless tobacco: Never   Tobacco comments:    Quit smoking June 08, 2023after her mom passed away  Vaping Use   Vaping status: Never Used  Substance and Sexual Activity   Alcohol use: Not Currently    Comment: occasional use; social drinker   Drug use: No   Sexual activity: Yes    Partners: Male    Birth control/protection: None    Comment: Pregnant  Other Topics Concern   Not on file  Social History Narrative   Not on file   Social Drivers of Health   Financial Resource Strain: Low Risk  (05/08/2022)   Received from [facility]   Overall Financial Resource Strain (CARDIA)    Difficulty of Paying Living Expenses: Not very hard  Food Insecurity: No Food Insecurity (09/28/2022)   Hunger Vital Sign    Worried About Running Out of Food in the Last Year: Never true    Ran Out of Food in the Last Year: Never true  Transportation Needs: No Transportation Needs (09/28/2022)   PRAPARE - Administrator, Civil Service (Medical): No    Lack of Transportation (Non-Medical): No  Physical Activity: Unknown (11/03/2020)   Received from [facility]   Exercise Vital Sign    On average, how many days per week do you engage in moderate to strenuous exercise (like a brisk walk)?: 5 days    On average, how many minutes do you engage in exercise at this level?: Patient declined   Stress: Stress Concern Present (11/03/2020)   Received from [facility] - Occupational Stress Questionnaire    Feeling of Stress : To some extent  Social Connections: Unknown (05/10/2021)   Received from [facility]   Social Network    Social Network: Not on file  Family History  Problem Relation Age of Onset   Diabetes Mother    High blood pressure Mother    Renal Disease Mother    Diabetes Sister    Cancer Maternal Grandmother    Diabetes Maternal Grandmother    High blood pressure Maternal Grandmother    Cancer Maternal Aunt        breast   Other Father       ROS:   Please see the history of present illness.     All other systems reviewed and are negative.   Labs/EKG Reviewed:    EKG:   EKG is was not ordered today.    Recent Labs: 12/10/2022: Magnesium  1.9 10/04/2023: TSH 0.894 11/14/2023: ALT 14; BUN 9; Creatinine, Ser 1.11; Hemoglobin 10.0; Platelets 341; Potassium 4.2; Sodium 139   Recent Lipid Panel Lab Results  Component Value Date/Time   CHOL 240 (H) 11/15/2021 12:00 AM   TRIG 492 (H) 11/15/2021 12:00 AM   HDL 40 (L) 11/15/2021 12:00 AM   CHOLHDL 6.0 (H) 11/15/2021 12:00 AM   LDLCALC  11/15/2021 12:00 AM     Comment:     . LDL cholesterol not calculated. Triglyceride levels greater than 400 mg/dL invalidate calculated LDL results. . Reference range: <100 . Desirable range <100 mg/dL for primary prevention;   <70 mg/dL for patients with CHD or diabetic patients  with > or = 2 CHD risk factors. SABRA LDL-C is now calculated using the Martin-Hopkins  calculation, which is a validated novel method providing  better accuracy than the Friedewald equation in the  estimation of LDL-C.  Gladis APPLETHWAITE et al. SANDREA. 7986;689(80): 2061-2068  (http://education.[employer].com/faq/FAQ164)     Physical Exam:    VS:  BP 108/64   Pulse 96   Ht 5' 4 (1.626 m)   Wt 199 lb 12.8 oz (90.6 kg)   LMP 04/27/2023  (Exact Date)   SpO2 97%   BMI 34.30 kg/m     Wt Readings from Last 3 Encounters:  11/20/23 199 lb 12.8 oz (90.6 kg)  11/16/23 202 lb (91.6 kg)  11/14/23 201 lb (91.2 kg)     GEN:  Well nourished, well developed in no acute distress HEENT: Normal NECK: No JVD; No carotid bruits LYMPHATICS: No lymphadenopathy CARDIAC: RRR, no murmurs, rubs, gallops RESPIRATORY:  Clear to auscultation without rales, wheezing or rhonchi  ABDOMEN: Soft, non-tender, non-distended MUSCULOSKELETAL:  No edema; No deformity  SKIN: Warm and dry NEUROLOGIC:  Alert and oriented x 3 PSYCHIATRIC:  Normal affect    Risk Assessment/Risk Calculators:                  ASSESSMENT & PLAN:    Chronic hypertension in pregnancy FSGC Seizure Disorder CKD Hx of gestational diabetes  Her blood pressure is low in the office today, will stop the hydralazine  at this time. She will see our pharmacist soon   - will defer to our OBGYN and MFM for plan delivery around 36-38 weeks due to history of severe preeclampsia. Continue Asprin for preeclampsia prophylaxis   She needs a close follow-up.  Will see the patient in 1 week.   Patient Instructions  Medication Instructions:  Your physician has recommended you make the following change in your medication:  STOP: Hydralazine   *If you need a refill on your cardiac medications before your next appointment, please call your pharmacy*  Follow-Up: At [facility], you and your health needs are our priority.  As part of our continuing mission to  provide you with exceptional heart care, our providers are all part of one team.  This team includes your primary Cardiologist (physician) and Advanced Practice Providers or APPs (Physician Assistants and Nurse Practitioners) who all work together to provide you with the care you need, when you need it.  Your next appointment:    [place] 11/18 at 12p  Provider:   Ikenna Ohms, DO      Dispo:  No follow-ups on  file.   Medication Adjustments/Labs and Tests Ordered: Current medicines are reviewed at length with the patient today.  Concerns regarding medicines are outlined above.  Tests Ordered: No orders of the defined types were placed in this encounter.  Medication Changes: No orders of the defined types were placed in this encounter.

## 2023-11-23 ENCOUNTER — Ambulatory Visit

## 2023-11-26 ENCOUNTER — Ambulatory Visit: Admitting: Pharmacist

## 2023-11-27 ENCOUNTER — Encounter: Payer: Self-pay | Admitting: Pharmacist

## 2023-11-27 ENCOUNTER — Ambulatory Visit: Attending: Internal Medicine | Admitting: Pharmacist

## 2023-11-27 VITALS — BP 131/85 | HR 84

## 2023-11-27 DIAGNOSIS — O10919 Unspecified pre-existing hypertension complicating pregnancy, unspecified trimester: Secondary | ICD-10-CM

## 2023-11-27 DIAGNOSIS — Z3A30 30 weeks gestation of pregnancy: Secondary | ICD-10-CM | POA: Diagnosis not present

## 2023-11-27 MED ORDER — HYDRALAZINE HCL 10 MG PO TABS: 10.0000 mg | ORAL_TABLET | Freq: Two times a day (BID) | ORAL | 5 refills | Status: DC

## 2023-11-27 NOTE — Progress Notes (Signed)
 Patient ID: Selena Meyer                 DOB: 1985/10/25                      MRN: [id number]     HPI: Selena Meyer is a 37 y.o. female referred to cardio OB clinic. PMH is significant for FSGS, HTN, GDM, and epilepsy.  Patient hypotensive at last 2 cardio OB visits. BP 99/63 on 10/12/23 and patient was dizzy. Sent to MAU.  On 11/11 BP was 108/64 and hydralazine  was discontinued.  Presents today for BP follow up. Reports readings have been elevated since discontinuing hydralazine .  Home readings (no time/date): 164/87 153/97 123/73 161/91 153/86 157/84  Has f/u OB appt tomorrow morning.  Current HTN meds:  Labetalol  400mg  TID Nifedipine  60mg  in morning, 90mg  in evening  Wt Readings from Last 3 Encounters:  11/20/23 199 lb 12.8 oz (90.6 kg)  11/16/23 202 lb (91.6 kg)  11/14/23 201 lb (91.2 kg)   BP Readings from Last 3 Encounters:  11/27/23 131/85  11/20/23 108/64  11/16/23 138/79   Pulse Readings from Last 3 Encounters:  11/27/23 84  11/20/23 96  11/16/23 91    Renal function: Estimated Creatinine Clearance: 73.5 mL/min (A) (by C-G formula based on SCr of 1.11 mg/dL (H)).  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    Gestational diabetes    Hypertension    Hypokalemia 11/02/2020   Irregular menstrual cycle    Irregular periods/menstrual cycles 05/03/2017   Polycystic ovaries 11/09/2015   Proteinuria 02/28/2013   Stage 2 chronic kidney disease 04/27/2020   SVT (supraventricular tachycardia) 11/01/2020   Systolic murmur 07/16/2017   Tobacco abuse 02/28/2013   Uncontrolled hypertension 05/10/2017    Current Outpatient Medications on File Prior to Visit   Medication Sig Dispense Refill   Accu-Chek Softclix Lancets lancets 1 each by Other route 4 (four) times daily. 200 each 3   acetaminophen  (TYLENOL ) 500 MG tablet Take 500 mg by mouth as needed.     albuterol  (VENTOLIN  HFA) 108 (90 Base) MCG/ACT inhaler Inhale 2 puffs into the lungs every 6 (six) hours as needed for wheezing or shortness of breath. 1 each 1   aspirin  EC 81 MG tablet Take 2 tablets (162 mg total) by mouth daily. Swallow whole. 60 tablet 11   Blood Glucose Monitoring Suppl (ACCU-CHEK GUIDE) w/Device KIT 1 Device by Does not apply route 4 (four) times daily. 1 kit 0   ferrous sulfate  325 (65 FE) MG tablet Take 1 tablet (325 mg total) by mouth every other day. 100 tablet 3   folic acid  (FOLVITE ) 1 MG tablet Take 3 tablets (3 mg total) by mouth daily. 270 tablet 3   glucose blood test strip Check blood sugars 4x daily 200 each 3   insulin  aspart (NOVOLOG  FLEXPEN) 100 UNIT/ML FlexPen Inject 4 Units into the skin 2 (two) times daily with a meal. With lunch & dinner 15 mL 11   insulin  glargine (LANTUS ) 100 UNIT/ML Solostar Pen Inject 12 Units into the skin daily. 15 mL 2   Insulin  Pen Needle (PEN NEEDLES) 32G X 4 MM MISC 1 Units by Does not  apply route in the morning, at noon, in the evening, and at bedtime. 50 each 1   labetalol  (NORMODYNE ) 200 MG tablet Take 2 tablets (400 mg total) by mouth 3 (three) times daily. 540 tablet 3   metoCLOPramide  (REGLAN ) 10 MG tablet Take 1 tablet (10 mg total) by mouth 4 (four) times daily as needed for nausea or vomiting. 30 tablet 2   Midazolam  (NAYZILAM ) 5 MG/0.1ML SOLN Place 5 mg into the nose as needed (For seizure cluster or seizure lasting more than 5 minutes). 2 each 0   NIFEdipine  (ADALAT  CC) 60 MG 24 hr tablet Take 60mg  in the morning and 90 mg in the evening (1 and 1/2 tablets)     ondansetron  (ZOFRAN -ODT) 8 MG disintegrating tablet Take 0.5 tablets (4 mg total) by mouth every 8 (eight) hours as needed for nausea. 30 tablet 2   oxcarbazepine   (TRILEPTAL ) 600 MG tablet Take 1.5 tablets (900 mg total) by mouth 2 (two) times daily. 90 tablet 2   Prenatal Vit-Fe Fumarate-FA (MULTIVITAMIN-PRENATAL) 27-0.8 MG TABS tablet Take 1 tablet by mouth daily at 12 noon.     No current facility-administered medications on file prior to visit.    Allergies  Allergen Reactions   Ibuprofen Hives and Rash     Assessment/Plan:  1. Hypertension -  BP elevated in room today at 142/90 although came down slightly to 131/85. Home readings all elevated.  Will restart hydralazine  today but at a lower dose due to history of hypotension. Will start at 10mg  twice daily and titrate up based on home readings.  Pt voiced understanding.  Start hydralazine  10mg  BID Continue labetalol  400mg  TID Continue nifedipine  60mg  in morning, 90mg  in evening F/u with Dr Sheena

## 2023-11-27 NOTE — Progress Notes (Unsigned)
 PRENATAL VISIT NOTE  Subjective:  Selena Meyer is a 38 y.o. G2P0101 at [redacted]w[redacted]d being seen today for ongoing prenatal care.  She is currently monitored for the following issues for this high-risk pregnancy and has FSGS (focal segmental glomerulosclerosis); Maternal iron deficiency anemia complicating pregnancy; SVT (supraventricular tachycardia); Chronic hypertension; Moderate left ventricular hypertrophy; Supervision of high risk pregnancy, antepartum; Nonintractable epilepsy without status epilepticus (HCC); Hypertensive nephropathy; Proteinuria; Early GDM w/ polyhydramnios; and AMA (advanced maternal age) multigravida 35+ on their problem list.  Patient reports {[id number]}.   .  .   . Denies leaking of fluid.   The following portions of the patient's history were reviewed and updated as appropriate: allergies, current medications, past family history, past medical history, past social history, past surgical history and problem list.   Objective:   There were no vitals filed for this visit.  Fetal Status:           General: Alert, oriented and cooperative. Patient is in no acute distress.  Skin: Skin is warm and dry. No rash noted.   Cardiovascular: Normal heart rate noted  Respiratory: Normal respiratory effort, no problems with respiration noted  Abdomen: Soft, gravid, appropriate for gestational age.        Pelvic: Cervical exam deferred        Extremities: Normal range of motion.     Mental Status: Normal mood and affect. Normal behavior. Normal judgment and thought content.      11/14/2023    4:19 PM 10/24/2023    2:50 PM 07/25/2023    3:32 PM  Depression screen PHQ 2/9  Decreased Interest 1 0 1  Down, Depressed, Hopeless 1 0 1  PHQ - 2 Score 2 0 2  Altered sleeping 1  0  Tired, decreased energy 1  1  Change in appetite 0  1  Feeling bad or failure about yourself  0  0  Trouble concentrating 0  0  Moving slowly or fidgety/restless 0  0  Suicidal thoughts   0  PHQ-9 Score  4   4      Data saved with a previous flowsheet row definition        11/14/2023    4:18 PM 07/25/2023    3:32 PM 05/25/2023   10:08 AM 05/16/2022    2:19 PM  GAD 7 : Generalized Anxiety Score  Nervous, Anxious, on Edge 0 1 1 1   Control/stop worrying 0 0 1 2  Worry too much - different things 0 0 0 0  Trouble relaxing 1 0 0 0  Restless 0 0 0 0  Easily annoyed or irritable 1 0 0 1  Afraid - awful might happen 0 0 0 1  Total GAD 7 Score 2 1 2 5   Anxiety Difficulty    Not difficult at all    Assessment and Plan:  Pregnancy: G2P0101 at [redacted]w[redacted]d 1. Supervision of high risk pregnancy, antepartum (Primary) TDAP today. ***  2. Pregnancy with 30 completed weeks gestation  3. FSGS (focal segmental glomerulosclerosis) Follows with renal, see details below.   4. Hypertensive nephropathy  5. Multigravida of advanced maternal age in third trimester  6. Maternal iron deficiency anemia complicating pregnancy in third trimester F/w heme HgB is 10. Iron studies not c/w Fe def.   7. Proteinuria, unspecified type Baseline PC 1713, Cr 0.82 Most recent Cr 1.07 on 10/3 - repeat ordered on 11/5 was 1.11. Will check again this week. ***  Saw nephrology 9/22 with no  changes to plan Next neph appt postpartum 04/08/24  8. Nonintractable epilepsy without status epilepticus, unspecified epilepsy type (HCC) 11/5 level was normal.  Trileptal  900 BID increased at last visit.   9. Insulin  controlled gestational diabetes mellitus (GDM) in third trimester - Prescribed Lantus  12u and novolog  6u with lunch/dinner.  - CBG review: *** - Growth 10/29 @ 27/5 1341g (88%), MVP 9.74 - Next US  12/5  10. Moderate left ventricular hypertrophy Follows with Dr. Sheena.   11. Chronic hypertension On labetalol  and nifed. Has been taken off hydral due to low BP. Reviewed Dr. Ginette note.   Preterm labor symptoms and general obstetric precautions including but not limited to vaginal bleeding, contractions, leaking of  fluid and fetal movement were reviewed in detail with the patient. Please refer to After Visit Summary for other counseling recommendations.   No follow-ups on file.  Future Appointments  Date Time Provider Department Center  11/27/2023 12:00 PM Darrell Bruckner, [facility] H&V  11/28/2023  3:10 PM Cleatus Moccasin, MD [facility]  12/14/2023 10:15 AM [facility] PROVIDER 1 [facility]  12/14/2023 10:30 AM [facility] US4 [facility]  12/21/2023 11:15 AM [facility] PROVIDER 1 [facility]  12/21/2023 11:30 AM [facility] US1 [facility]  12/28/2023  1:15 PM [facility] PROVIDER 1 [facility]  12/28/2023  1:30 PM [facility] US2 [facility]  01/04/2024  2:15 PM [facility] PROVIDER 1 [facility]  01/04/2024  2:30 PM [facility] US1 [facility]  02/13/2024  3:00 PM [facility] LAB [facility] None  02/13/2024  3:15 PM Tonette Lauraine HERO, PA-C [facility] None  05/08/2024  2:45 PM Gregg Lek, MD [facility] None    Moccasin Cleatus, MD

## 2023-11-27 NOTE — Patient Instructions (Signed)
 SABRA

## 2023-11-28 ENCOUNTER — Encounter: Payer: Self-pay | Admitting: Obstetrics and Gynecology

## 2023-11-28 ENCOUNTER — Ambulatory Visit (INDEPENDENT_AMBULATORY_CARE_PROVIDER_SITE_OTHER): Admitting: Obstetrics and Gynecology

## 2023-11-28 VITALS — BP 122/75 | HR 96 | Wt 200.1 lb

## 2023-11-28 DIAGNOSIS — Z23 Encounter for immunization: Secondary | ICD-10-CM | POA: Diagnosis not present

## 2023-11-28 DIAGNOSIS — G40909 Epilepsy, unspecified, not intractable, without status epilepticus: Secondary | ICD-10-CM

## 2023-11-28 DIAGNOSIS — D509 Iron deficiency anemia, unspecified: Secondary | ICD-10-CM

## 2023-11-28 DIAGNOSIS — N051 Unspecified nephritic syndrome with focal and segmental glomerular lesions: Secondary | ICD-10-CM

## 2023-11-28 DIAGNOSIS — O0990 Supervision of high risk pregnancy, unspecified, unspecified trimester: Secondary | ICD-10-CM

## 2023-11-28 DIAGNOSIS — O99013 Anemia complicating pregnancy, third trimester: Secondary | ICD-10-CM

## 2023-11-28 DIAGNOSIS — O09523 Supervision of elderly multigravida, third trimester: Secondary | ICD-10-CM | POA: Diagnosis not present

## 2023-11-28 DIAGNOSIS — O24414 Gestational diabetes mellitus in pregnancy, insulin controlled: Secondary | ICD-10-CM

## 2023-11-28 DIAGNOSIS — R809 Proteinuria, unspecified: Secondary | ICD-10-CM

## 2023-11-28 DIAGNOSIS — I517 Cardiomegaly: Secondary | ICD-10-CM

## 2023-11-28 DIAGNOSIS — Z3A30 30 weeks gestation of pregnancy: Secondary | ICD-10-CM | POA: Diagnosis not present

## 2023-11-28 DIAGNOSIS — I10 Essential (primary) hypertension: Secondary | ICD-10-CM

## 2023-11-28 DIAGNOSIS — I129 Hypertensive chronic kidney disease with stage 1 through stage 4 chronic kidney disease, or unspecified chronic kidney disease: Secondary | ICD-10-CM

## 2023-11-28 MED ORDER — NOVOLOG FLEXPEN 100 UNIT/ML ~~LOC~~ SOPN: 6.0000 [IU] | PEN_INJECTOR | Freq: Three times a day (TID) | SUBCUTANEOUS | 3 refills | Status: DC

## 2023-11-29 ENCOUNTER — Ambulatory Visit: Payer: Self-pay | Admitting: Obstetrics and Gynecology

## 2023-11-29 LAB — COMPREHENSIVE METABOLIC PANEL WITH GFR
ALT: 14 IU/L (ref 0–32)
AST: 16 IU/L (ref 0–40)
Albumin: 3.5 g/dL — ABNORMAL LOW (ref 3.9–4.9)
Alkaline Phosphatase: 75 IU/L (ref 41–116)
BUN/Creatinine Ratio: 12 (ref 9–23)
BUN: 13 mg/dL (ref 6–24)
Bilirubin Total: <0.2 mg/dL (ref 0.0–1.2)
CO2: 20 mmol/L (ref 20–29)
Calcium: 8.5 mg/dL — ABNORMAL LOW (ref 8.7–10.2)
Chloride: 103 mmol/L (ref 96–106)
Creatinine, Ser: 1.05 mg/dL — ABNORMAL HIGH (ref 0.57–1.00)
Globulin, Total: 2.3 g/dL (ref 1.5–4.5)
Glucose: 70 mg/dL (ref 70–99)
Potassium: 4.5 mmol/L (ref 3.5–5.2)
Sodium: 135 mmol/L (ref 134–144)
Total Protein: 5.8 g/dL — ABNORMAL LOW (ref 6.0–8.5)
eGFR: 69 mL/min/1.73 (ref >59)

## 2023-11-30 ENCOUNTER — Ambulatory Visit

## 2023-12-03 ENCOUNTER — Telehealth: Payer: Self-pay

## 2023-12-03 NOTE — Telephone Encounter (Signed)
 Pt reported has noticed that her hands are getting numb in the last couple of days. Pt reported also has felt dizzy when bending over, blood pressures have been 140s over 90s at home, was told by cardiology will need to come back weekly, has re-started Hydralazine , is waiting on nurse to call her back. Pt reported has a slight headache, denied any blurred vision, no RUQ pain, ankles swelling. Pt reported good fetal movement, no bleeding or leaking. Current BP now is 143/89, pulse 103. RN reviewed with Dr. Erik with recommendations for hydrate, rest, if blood pressures in severe range to notify provider and or go to MAU.   [name], RN

## 2023-12-05 ENCOUNTER — Ambulatory Visit: Attending: Internal Medicine | Admitting: Pharmacist

## 2023-12-05 ENCOUNTER — Encounter: Payer: Self-pay | Admitting: Pharmacist

## 2023-12-05 VITALS — BP 90/57 | HR 101

## 2023-12-05 DIAGNOSIS — Z3A32 32 weeks gestation of pregnancy: Secondary | ICD-10-CM | POA: Diagnosis not present

## 2023-12-05 DIAGNOSIS — O10919 Unspecified pre-existing hypertension complicating pregnancy, unspecified trimester: Secondary | ICD-10-CM

## 2023-12-05 NOTE — Progress Notes (Addendum)
 Patient ID: ESTEE YOHE                 DOB: Nov 17, 1985                      MRN: [id number]     HPI: Selena Meyer is a 38 y.o. female referred to cardio OB clinic. PMH is significant for FSGS, HTN, GDM, and epilepsy.  Patient hypotensive at last 2 cardio OB visits. BP 99/63 on 10/12/23 and patient was dizzy. Sent to MAU.  On 11/11 BP was 108/64 and hydralazine  was discontinued.  Presents today for BP follow up. Reports readings have been elevated since discontinuing hydralazine . Low dose hydralazine  added at last visit.  Patient presents today concerned regarding her nausea and blood pressure. Vomited before arriving here.   Reports elevated readings which she provided from her home cuff: 156/77, 160/96, 149/91, 155/96, 133/76, 148/94, 161/103, 175/106, 171/108, 151/93, 154/85  Had low blood sugar episode this morning which she corrected with a soda and a pie. Had patient check BG in room: 182.  Takes medications at 10am, 4pm, and 10pm.   Current HTN meds:  Labetalol  400mg  TID Nifedipine  60mg  in morning, 90mg  in evening  Wt Readings from Last 3 Encounters:  11/28/23 200 lb 1.3 oz (90.8 kg)  11/20/23 199 lb 12.8 oz (90.6 kg)  11/16/23 202 lb (91.6 kg)   BP Readings from Last 3 Encounters:  11/28/23 122/75  11/27/23 131/85  11/20/23 108/64   Pulse Readings from Last 3 Encounters:  11/28/23 96  11/27/23 84  11/20/23 96    Renal function: Estimated Creatinine Clearance: 77.7 mL/min (A) (by C-G formula based on SCr of 1.05 mg/dL (H)).  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    Gestational diabetes    Hypertension    Hypokalemia 11/02/2020   Irregular menstrual cycle     Irregular periods/menstrual cycles 05/03/2017   Polycystic ovaries 11/09/2015   Proteinuria 02/28/2013   Stage 2 chronic kidney disease 04/27/2020   SVT (supraventricular tachycardia) 11/01/2020   Systolic murmur 07/16/2017   Tobacco abuse 02/28/2013   Uncontrolled hypertension 05/10/2017    Current Outpatient Medications on File Prior to Visit  Medication Sig Dispense Refill   Accu-Chek Softclix Lancets lancets 1 each by Other route 4 (four) times daily. 200 each 3   acetaminophen  (TYLENOL ) 500 MG tablet Take 500 mg by mouth as needed.     albuterol  (VENTOLIN  HFA) 108 (90 Base) MCG/ACT inhaler Inhale 2 puffs into the lungs every 6 (six) hours as needed for wheezing or shortness of breath. 1 each 1   aspirin  EC 81 MG tablet Take 2 tablets (162 mg total) by mouth daily. Swallow whole. 60 tablet 11   Blood Glucose Monitoring Suppl (ACCU-CHEK GUIDE) w/Device KIT 1 Device by Does not apply route 4 (four) times daily. 1 kit 0   ferrous sulfate  325 (65 FE) MG tablet Take 1 tablet (325 mg total) by mouth every other day. 100 tablet 3   folic acid  (FOLVITE ) 1 MG tablet Take 3 tablets (3 mg total) by mouth daily. 270 tablet 3   glucose blood test strip Check blood sugars 4x daily 200 each 3   hydrALAZINE  (  APRESOLINE ) 10 MG tablet Take 1 tablet (10 mg total) by mouth 2 (two) times daily. 60 tablet 5   insulin  aspart (NOVOLOG  FLEXPEN) 100 UNIT/ML FlexPen Inject 6 Units into the skin with breakfast, with lunch, and with evening meal. With lunch & dinner 9 mL 3   insulin  glargine (LANTUS ) 100 UNIT/ML Solostar Pen Inject 12 Units into the skin daily. 15 mL 2   Insulin  Pen Needle (PEN NEEDLES) 32G X 4 MM MISC 1 Units by Does not apply route in the morning, at noon, in the evening, and at bedtime. 50 each 1   labetalol  (NORMODYNE ) 200 MG tablet Take 2 tablets (400 mg total) by mouth 3 (three) times daily. 540 tablet 3   metoCLOPramide  (REGLAN ) 10 MG tablet Take 1 tablet (10 mg total) by mouth 4 (four)  times daily as needed for nausea or vomiting. 30 tablet 2   Midazolam  (NAYZILAM ) 5 MG/0.1ML SOLN Place 5 mg into the nose as needed (For seizure cluster or seizure lasting more than 5 minutes). 2 each 0   NIFEdipine  (ADALAT  CC) 60 MG 24 hr tablet Take 60mg  in the morning and 90 mg in the evening (1 and 1/2 tablets)     ondansetron  (ZOFRAN -ODT) 8 MG disintegrating tablet Take 0.5 tablets (4 mg total) by mouth every 8 (eight) hours as needed for nausea. 30 tablet 2   oxcarbazepine  (TRILEPTAL ) 600 MG tablet Take 1.5 tablets (900 mg total) by mouth 2 (two) times daily. 90 tablet 2   Prenatal Vit-Fe Fumarate-FA (MULTIVITAMIN-PRENATAL) 27-0.8 MG TABS tablet Take 1 tablet by mouth daily at 12 noon.     No current facility-administered medications on file prior to visit.    Allergies  Allergen Reactions   Ibuprofen Hives and Rash     Assessment/Plan:  1. Hypertension -  Patient concerned about elevated home BP readings however in office readings today were normal and third reading hypotension.  Patient is asymptomatic. Her next medications are due at 4pm. Advised to check BP and call/message clinic with readings to decide on next course of action. Patient voiced understanding.  Continue: Labetalol  400mg  TID Nifedipine  60mg  in morning, 90mg  in evening Hydralazine  10mg  BID  Addendum: Patient called back at 3:30 and reported BP reading of 135/77 and she felt well. Advised to take labetalol  as prescribed. Recommended she hold hydralazine  unless SBP >150. Pt voiced understanding.   Chris Yazmeen Woolf, PharmD, BCACP, CDCES, CPP [facility] [address], [place], [place] [address] Phone: [phone]; Fax: [phone] 12/05/2023 1:56 PM

## 2023-12-05 NOTE — Patient Instructions (Signed)
 SABRA

## 2023-12-12 NOTE — Progress Notes (Unsigned)
 PRENATAL VISIT NOTE  Subjective:  Selena Meyer is a 37 y.o. G2P0101 at [redacted]w[redacted]d being seen today for ongoing prenatal care.  She is currently monitored for the following issues for this high-risk pregnancy and has FSGS (focal segmental glomerulosclerosis); Maternal iron  deficiency anemia complicating pregnancy; SVT (supraventricular tachycardia); Chronic hypertension; Moderate left ventricular hypertrophy; Supervision of high risk pregnancy, antepartum; Nonintractable epilepsy without status epilepticus (HCC); Hypertensive nephropathy; Proteinuria; Early GDM w/ polyhydramnios; and AMA (advanced maternal age) multigravida 35+ on their problem list.  Patient reports {[id number]}.   .  .   . Denies leaking of fluid.   The following portions of the patient's history were reviewed and updated as appropriate: allergies, current medications, past family history, past medical history, past social history, past surgical history and problem list.   Objective:   There were no vitals filed for this visit.  Fetal Status:           General: Alert, oriented and cooperative. Patient is in no acute distress.  Skin: Skin is warm and dry. No rash noted.   Cardiovascular: Normal heart rate noted  Respiratory: Normal respiratory effort, no problems with respiration noted  Abdomen: Soft, gravid, appropriate for gestational age.        Pelvic: Cervical exam deferred        Extremities: Normal range of motion.     Mental Status: Normal mood and affect. Normal behavior. Normal judgment and thought content.      11/14/2023    4:19 PM 10/24/2023    2:50 PM 07/25/2023    3:32 PM  Depression screen PHQ 2/9  Decreased Interest 1 0 1  Down, Depressed, Hopeless 1 0 1  PHQ - 2 Score 2 0 2  Altered sleeping 1  0  Tired, decreased energy 1  1  Change in appetite 0  1  Feeling bad or failure about yourself  0  0  Trouble concentrating 0  0  Moving slowly or fidgety/restless 0  0  Suicidal thoughts   0  PHQ-9 Score  4   4      Data saved with a previous flowsheet row definition        11/14/2023    4:18 PM 07/25/2023    3:32 PM 05/25/2023   10:08 AM 05/16/2022    2:19 PM  GAD 7 : Generalized Anxiety Score  Nervous, Anxious, on Edge 0 1 1 1   Control/stop worrying 0 0 1 2  Worry too much - different things 0 0 0 0  Trouble relaxing 1 0 0 0  Restless 0 0 0 0  Easily annoyed or irritable 1 0 0 1  Afraid - awful might happen 0 0 0 1  Total GAD 7 Score 2 1 2 5   Anxiety Difficulty    Not difficult at all    Assessment and Plan:  Pregnancy: G2P0101 at [redacted]w[redacted]d 1. Supervision of high risk pregnancy, antepartum (Primary) Cultures next visit.   2. Pregnancy with 32 completed weeks gestation  3. Chronic hypertension BP is *** labetalol  400 TID, nifed 60/90, hydral 10  4. Insulin  controlled gestational diabetes mellitus (GDM) in third trimester - Prescribed Lantus  12u and novolog  6u with lunch/dinner.  - CBG review: Fastings ***. Meals are ***. Sometimes 1 hr and sometimes 2 hr.  - Will keep regimen the same.  - Growth 10/29 @ 27/5 1341g (88%), MVP 9.74 - Next US  12/5  5. Nonintractable epilepsy without status epilepticus, unspecified epilepsy type [facility]) Due for  trileptal  level. Last was done about a month ago and was therapeutic.   6. Maternal iron  deficiency anemia complicating pregnancy in third trimester Last CBC on 11/5 was 10.0.   7. FSGS (focal segmental glomerulosclerosis) Baseline PC 1713, Cr 0.82 Creatinine stable at 1.05.  Saw nephrology 9/22 with no changes to plan Next neph appt postpartum 04/08/24  Preterm labor symptoms and general obstetric precautions including but not limited to vaginal bleeding, contractions, leaking of fluid and fetal movement were reviewed in detail with the patient. Please refer to After Visit Summary for other counseling recommendations.   No follow-ups on file.  Future Appointments  Date Time Provider Department Center  12/13/2023  2:50 PM Cleatus Moccasin, MD [facility]  12/14/2023 10:15 AM [facility] PROVIDER 1 [facility]  12/14/2023 10:30 AM [facility] US4 [facility]  12/18/2023  3:50 PM Rasch, Delon FERNS, NP [facility]  12/21/2023 11:15 AM [facility] PROVIDER 1 [facility]  12/21/2023 11:30 AM [facility] US1 [facility]  12/25/2023  3:50 PM Rasch, Delon FERNS, NP [facility]  12/28/2023  1:15 PM [facility] PROVIDER 1 [facility]  12/28/2023  1:30 PM [facility] US2 [facility]  01/01/2024  3:50 PM Leftwich-Kirby, Olam LABOR, CNM [facility]  01/04/2024  2:15 PM [facility] PROVIDER 1 [facility]  01/04/2024  2:30 PM [facility] US1 [facility]  01/09/2024  4:10 PM Constant, Peggy, MD [facility]  02/13/2024  3:00 PM [facility] None  02/13/2024  3:15 PM Tonette Lauraine HERO, PA-C [facility] None  05/08/2024  2:45 PM Gregg Lek, MD [facility] None    Moccasin Cleatus, MD

## 2023-12-13 ENCOUNTER — Ambulatory Visit: Admitting: Obstetrics and Gynecology

## 2023-12-13 ENCOUNTER — Encounter: Payer: Self-pay | Admitting: Obstetrics and Gynecology

## 2023-12-13 VITALS — BP 108/67 | HR 92 | Wt 198.0 lb

## 2023-12-13 DIAGNOSIS — G40909 Epilepsy, unspecified, not intractable, without status epilepticus: Secondary | ICD-10-CM

## 2023-12-13 DIAGNOSIS — O24414 Gestational diabetes mellitus in pregnancy, insulin controlled: Secondary | ICD-10-CM

## 2023-12-13 DIAGNOSIS — Z3A32 32 weeks gestation of pregnancy: Secondary | ICD-10-CM

## 2023-12-13 DIAGNOSIS — I10 Essential (primary) hypertension: Secondary | ICD-10-CM

## 2023-12-13 DIAGNOSIS — N051 Unspecified nephritic syndrome with focal and segmental glomerular lesions: Secondary | ICD-10-CM

## 2023-12-13 DIAGNOSIS — O0990 Supervision of high risk pregnancy, unspecified, unspecified trimester: Secondary | ICD-10-CM

## 2023-12-13 DIAGNOSIS — O99013 Anemia complicating pregnancy, third trimester: Secondary | ICD-10-CM

## 2023-12-14 ENCOUNTER — Ambulatory Visit

## 2023-12-14 ENCOUNTER — Other Ambulatory Visit

## 2023-12-18 ENCOUNTER — Ambulatory Visit: Admitting: Obstetrics and Gynecology

## 2023-12-18 VITALS — BP 136/78 | HR 90 | Wt 197.0 lb

## 2023-12-18 DIAGNOSIS — N051 Unspecified nephritic syndrome with focal and segmental glomerular lesions: Secondary | ICD-10-CM

## 2023-12-18 DIAGNOSIS — O24414 Gestational diabetes mellitus in pregnancy, insulin controlled: Secondary | ICD-10-CM

## 2023-12-18 DIAGNOSIS — Z3A33 33 weeks gestation of pregnancy: Secondary | ICD-10-CM

## 2023-12-18 DIAGNOSIS — O0990 Supervision of high risk pregnancy, unspecified, unspecified trimester: Secondary | ICD-10-CM

## 2023-12-18 DIAGNOSIS — I10 Essential (primary) hypertension: Secondary | ICD-10-CM

## 2023-12-18 NOTE — Progress Notes (Signed)
 PRENATAL VISIT NOTE  Subjective:  Selena Meyer is a 38 y.o. G2P0101 at [redacted]w[redacted]d being seen today for ongoing prenatal care.  She is currently monitored for the following issues for this high-risk pregnancy and has FSGS (focal segmental glomerulosclerosis); Maternal iron  deficiency anemia complicating pregnancy; SVT (supraventricular tachycardia); Chronic hypertension; Moderate left ventricular hypertrophy; Supervision of high risk pregnancy, antepartum; Nonintractable epilepsy without status epilepticus (HCC); Hypertensive nephropathy; Proteinuria; Early GDM w/ polyhydramnios; and AMA (advanced maternal age) multigravida 35+ on their problem list.  Patient reports no complaints.  Contractions: Not present. Vag. Bleeding: None.  Movement: Present. Denies leaking of fluid.   The following portions of the patient's history were reviewed and updated as appropriate: allergies, current medications, past family history, past medical history, past social history, past surgical history and problem list.   Objective:   Vitals:   12/18/23 1607  BP: 136/78  Pulse: 90  Weight: 197 lb (89.4 kg)    Fetal Status:  Fetal Heart Rate (bpm): NST-R   Movement: Present    General: Alert, oriented and cooperative. Patient is in no acute distress.  Skin: Skin is warm and dry. No rash noted.   Cardiovascular: Normal heart rate noted  Respiratory: Normal respiratory effort, no problems with respiration noted  Abdomen: Soft, gravid, appropriate for gestational age.  Pain/Pressure: Present     Pelvic: Cervical exam deferred        Extremities: Normal range of motion.  Edema: Mild pitting, slight indentation  Mental Status: Normal mood and affect. Normal behavior. Normal judgment and thought content.      11/14/2023    4:19 PM 10/24/2023    2:50 PM 07/25/2023    3:32 PM  Depression screen PHQ 2/9  Decreased Interest 1 0 1  Down, Depressed, Hopeless 1 0 1  PHQ - 2 Score 2 0 2  Altered sleeping 1  0  Tired,  decreased energy 1  1  Change in appetite 0  1  Feeling bad or failure about yourself  0  0  Trouble concentrating 0  0  Moving slowly or fidgety/restless 0  0  Suicidal thoughts   0  PHQ-9 Score 4   4      Data saved with a previous flowsheet row definition        11/14/2023    4:18 PM 07/25/2023    3:32 PM 05/25/2023   10:08 AM 05/16/2022    2:19 PM  GAD 7 : Generalized Anxiety Score  Nervous, Anxious, on Edge 0 1 1 1   Control/stop worrying 0 0 1 2  Worry too much - different things 0 0 0 0  Trouble relaxing 1 0 0 0  Restless 0 0 0 0  Easily annoyed or irritable 1 0 0 1  Afraid - awful might happen 0 0 0 1  Total GAD 7 Score 2 1 2 5   Anxiety Difficulty    Not difficult at all    Assessment and Plan:  Pregnancy: G2P0101 at [redacted]w[redacted]d 1. [redacted] weeks gestation of pregnancy (Primary)  NST today. Reactive with baseline 130's, 15x15 accels, no decels.   2. Supervision of high risk pregnancy, antepartum  MFM canceled last growth US , she is now scheduled on 12/12 for growth US , last growth with EFW 88%tile.   3. Insulin  controlled gestational diabetes mellitus (GDM) in third trimester  Log reviewed, all fastings <95 Majority of PP meals <120 with elevated dinner readings.  Novolog  6 units with breakfast and 6 units lunch,  recently increased to 8 units with dinner.   Lantus  12 units in the AM, confirmed she is not taking PM lantus .  If LGA on growth US  will consider doing 2x weekly antenatal testing or weekly BPP.   4. Chronic hypertension  BP normal.  Continue labetalol  400 TID and Nifedipine  60 mg and 90 mg along with hydralazine  10 mg.  Has follow up with Dr. Sheena on 12/28/2023 Discussed and reviewed Pre E precautions and when to go to MAU.   5. FSGS (focal segmental glomerulosclerosis)  Cannot do labs today due to late for visit and now 1700 Will collect Trileptal , cmp, cbc  Preterm labor symptoms and general obstetric precautions including but not limited to vaginal  bleeding, contractions, leaking of fluid and fetal movement were reviewed in detail with the patient. Please refer to After Visit Summary for other counseling recommendations.   No follow-ups on file.  Future Appointments  Date Time Provider Department Center  12/21/2023 11:15 AM [facility] PROVIDER 1 [facility]  12/21/2023 11:30 AM [facility] US1 [facility]  12/25/2023  3:50 PM Hailie Searight, Delon FERNS, NP [facility]  12/28/2023  1:15 PM [facility] PROVIDER 1 [facility]  12/28/2023  1:30 PM [facility] US2 [facility]  12/28/2023  4:00 PM Tobb, Kardie, DO [facility] None  01/01/2024  3:50 PM Milly Olam LABOR, CNM [facility]  01/04/2024  2:15 PM [facility] PROVIDER 1 [facility]  01/04/2024  2:30 PM [facility] US1 [facility]  01/09/2024  4:10 PM Constant, Peggy, MD [facility]  02/13/2024  3:00 PM [facility] None  02/13/2024  3:15 PM Tonette Lauraine HERO, PA-C [facility] None  05/08/2024  2:45 PM Gregg Lek, MD [facility] None    Delon Emms, NP

## 2023-12-19 ENCOUNTER — Telehealth: Payer: Self-pay

## 2023-12-19 NOTE — Telephone Encounter (Signed)
 RN returned patient call. Pt reported no current concerns, reviewed next appointments.  [name], RN

## 2023-12-21 ENCOUNTER — Ambulatory Visit: Attending: Obstetrics | Admitting: Obstetrics and Gynecology

## 2023-12-21 ENCOUNTER — Ambulatory Visit

## 2023-12-21 VITALS — BP 142/64 | HR 86

## 2023-12-21 DIAGNOSIS — O09522 Supervision of elderly multigravida, second trimester: Secondary | ICD-10-CM

## 2023-12-21 DIAGNOSIS — O24414 Gestational diabetes mellitus in pregnancy, insulin controlled: Secondary | ICD-10-CM | POA: Diagnosis not present

## 2023-12-21 DIAGNOSIS — I10 Essential (primary) hypertension: Secondary | ICD-10-CM

## 2023-12-21 DIAGNOSIS — O09523 Supervision of elderly multigravida, third trimester: Secondary | ICD-10-CM | POA: Diagnosis not present

## 2023-12-21 DIAGNOSIS — O409XX0 Polyhydramnios, unspecified trimester, not applicable or unspecified: Secondary | ICD-10-CM

## 2023-12-21 DIAGNOSIS — Z3A34 34 weeks gestation of pregnancy: Secondary | ICD-10-CM | POA: Diagnosis not present

## 2023-12-21 DIAGNOSIS — O24912 Unspecified diabetes mellitus in pregnancy, second trimester: Secondary | ICD-10-CM

## 2023-12-21 DIAGNOSIS — O09299 Supervision of pregnancy with other poor reproductive or obstetric history, unspecified trimester: Secondary | ICD-10-CM

## 2023-12-21 NOTE — Progress Notes (Signed)
 Maternal-Fetal Medicine Consultation  Name: Selena Meyer  MRN: [id number]  GA: [id number] [redacted]w[redacted]d   Patient is here for fetal growth assessment and antenatal testing.  Gestational diabetes.  Patient takes Lantus  insulin  12 units in the morning and 8 units at night.  She takes NovoLog  6/6/8 units with meals.  She reports her fasting levels are below 95 mg/dL and postprandial levels are around 140 mg/dL.  Sometimes, she checks 1 hour postprandial values.  Chronic hypertension.  Patient takes labetalol  hydralazine  and nifedipine .  Blood pressure today at our office is 142/64 mmHg.  She does not have signs and symptoms of severe features of preeclampsia.  History of seizure disorder.  Patient takes Trileptal  and reports no recent seizure episodes.  Ultrasound The estimated fetal weight is at the 91st percentile and the abdominal circumference measurement is at the 97th percentile.  Normal amniotic fluid.  Cephalic presentation.  Antenatal testing is reassuring.  BPP 8/8.  I reassured the patient of the findings.  I discussed the importance of good blood glucose control to prevent fetal adverse outcomes.  I encouraged the patient to increase NovoLog  as needed.  We discussed hypoglycemia and corrective measures.  We discussed timing of delivery.  Because of comorbid conditions of diabetes and hypertension, I recommend delivery at [redacted] weeks gestation.  If diabetes is poorly controlled, delivery at [redacted] weeks gestation may be considered.  Alternatively, the patient may be admitted to the hospital.  Recommendations Continue weekly antenatal testing till delivery.    Consultation including face-to-face (more than 50%) counseling 25 minutes.

## 2023-12-25 ENCOUNTER — Telehealth: Payer: Self-pay | Admitting: *Deleted

## 2023-12-25 ENCOUNTER — Ambulatory Visit: Admitting: Obstetrics and Gynecology

## 2023-12-25 VITALS — BP 139/88 | HR 77 | Wt 198.0 lb

## 2023-12-25 DIAGNOSIS — O24414 Gestational diabetes mellitus in pregnancy, insulin controlled: Secondary | ICD-10-CM

## 2023-12-25 DIAGNOSIS — O0990 Supervision of high risk pregnancy, unspecified, unspecified trimester: Secondary | ICD-10-CM

## 2023-12-25 DIAGNOSIS — O09523 Supervision of elderly multigravida, third trimester: Secondary | ICD-10-CM

## 2023-12-25 DIAGNOSIS — Z3A34 34 weeks gestation of pregnancy: Secondary | ICD-10-CM

## 2023-12-25 DIAGNOSIS — I10 Essential (primary) hypertension: Secondary | ICD-10-CM

## 2023-12-25 NOTE — Progress Notes (Signed)
 PRENATAL VISIT NOTE  Subjective:  Selena Meyer is a 38 y.o. G2P0101 at [redacted]w[redacted]d being seen today for ongoing prenatal care.  She is currently monitored for the following issues for this high-risk pregnancy and has FSGS (focal segmental glomerulosclerosis); Maternal iron  deficiency anemia complicating pregnancy; SVT (supraventricular tachycardia); Chronic hypertension; Moderate left ventricular hypertrophy; Supervision of high risk pregnancy, antepartum; Nonintractable epilepsy without status epilepticus (HCC); Hypertensive nephropathy; Proteinuria; Early GDM w/ polyhydramnios; and AMA (advanced maternal age) multigravida 35+ on their problem list.  Patient reports no complaints.  Contractions: Not present. Vag. Bleeding: None.  Movement: Present. Denies leaking of fluid.   The following portions of the patient's history were reviewed and updated as appropriate: allergies, current medications, past family history, past medical history, past social history, past surgical history and problem list.   Objective:   Vitals:   12/25/23 1614 12/25/23 1620  BP: (!) 144/86 139/88  Pulse: 74 77  Weight: 198 lb (89.8 kg)     Fetal Status:  Fetal Heart Rate (bpm): 156   Movement: Present    General: Alert, oriented and cooperative. Patient is in no acute distress.  Skin: Skin is warm and dry. No rash noted.   Cardiovascular: Normal heart rate noted  Respiratory: Normal respiratory effort, no problems with respiration noted  Abdomen: Soft, gravid, appropriate for gestational age.  Pain/Pressure: Absent     Pelvic: Cervical exam deferred        Extremities: Normal range of motion.  Edema: None  Mental Status: Normal mood and affect. Normal behavior. Normal judgment and thought content.      11/14/2023    4:19 PM 10/24/2023    2:50 PM 07/25/2023    3:32 PM  Depression screen PHQ 2/9  Decreased Interest 1 0 1  Down, Depressed, Hopeless 1 0 1  PHQ - 2 Score 2 0 2  Altered sleeping 1  0  Tired,  decreased energy 1  1  Change in appetite 0  1  Feeling bad or failure about yourself  0  0  Trouble concentrating 0  0  Moving slowly or fidgety/restless 0  0  Suicidal thoughts   0  PHQ-9 Score 4   4      Data saved with a previous flowsheet row definition        11/14/2023    4:18 PM 07/25/2023    3:32 PM 05/25/2023   10:08 AM 05/16/2022    2:19 PM  GAD 7 : Generalized Anxiety Score  Nervous, Anxious, on Edge 0 1 1 1   Control/stop worrying 0 0 1 2  Worry too much - different things 0 0 0 0  Trouble relaxing 1 0 0 0  Restless 0 0 0 0  Easily annoyed or irritable 1 0 0 1  Afraid - awful might happen 0 0 0 1  Total GAD 7 Score 2 1 2 5   Anxiety Difficulty    Not difficult at all    Assessment and Plan:  Pregnancy: G2P0101 at [redacted]w[redacted]d 1. Chronic hypertension (Primary)  -Elevated initially then normal, however borderline.  -Will increase Labetalol  500 mg TID -Will have BP check with MFM on Friday.  -no HA or vision changes -Will get CBC, CMP, UPC today. -Home BP log 140's/80's -Fetal nonstress test today reactive: baseline 130's, 15x15 acels, no decels.  -Keep BPP on 12/19 with MFM -plan for 37 week delivery (requests 36.6 weeks @ [facility])  2. Insulin  controlled gestational diabetes mellitus (GDM) in third trimester  -  No log today, she will send through mychart - Reports not taking her insulin  some days due to low BS.  - May have to adjust insulin  dosing. Encouraged 3 meals and 3 snacks.  - BPP 8/8 (12/21/2023) - Keep follow up for [facility] 12/19 along with weekly NST here in [place].  - EFW 91%tile ( consistent with growth US  throughout pregnancy) - Fetal nonstress test  3. Supervision of high risk pregnancy, antepartum  - Fetal nonstress test  4. Multigravida of advanced maternal age in third trimester  - Fetal nonstress test  5. [redacted] weeks gestation of pregnancy  - Fetal nonstress test  Preterm labor symptoms and general obstetric precautions including but not limited to  vaginal bleeding, contractions, leaking of fluid and fetal movement were reviewed in detail with the patient. Please refer to After Visit Summary for other counseling recommendations.   No follow-ups on file.  Future Appointments  Date Time Provider Department Center  12/28/2023  1:15 PM [facility] PROVIDER 1 [facility]  12/28/2023  1:30 PM [facility] US2 [facility]  12/28/2023  4:00 PM Tobb, Kardie, DO [facility] None  01/01/2024  3:50 PM Milly Olam LABOR, CNM [facility]  01/04/2024  2:15 PM [facility] PROVIDER 1 [facility]  01/04/2024  2:30 PM [facility] US1 [facility]  01/09/2024  4:10 PM Constant, Peggy, MD [facility]  02/13/2024  3:00 PM [facility] None  02/13/2024  3:15 PM Tonette Lauraine HERO, PA-C [facility] None  05/08/2024  2:45 PM Gregg Lek, MD [facility] None    Delon Emms, NP

## 2023-12-25 NOTE — Telephone Encounter (Signed)
 Patient forgot to get [facility] slip to take to a [facility] in [place] as requested. Will call the office on 12/26/2023 if needed to fax order.

## 2023-12-28 ENCOUNTER — Ambulatory Visit: Attending: Obstetrics | Admitting: Obstetrics and Gynecology

## 2023-12-28 ENCOUNTER — Encounter: Payer: Self-pay | Admitting: Cardiology

## 2023-12-28 ENCOUNTER — Ambulatory Visit

## 2023-12-28 ENCOUNTER — Ambulatory Visit: Admitting: Cardiology

## 2023-12-28 VITALS — BP 142/88 | HR 94 | Ht 64.0 in | Wt 199.9 lb

## 2023-12-28 VITALS — BP 132/76 | HR 83

## 2023-12-28 DIAGNOSIS — O09299 Supervision of pregnancy with other poor reproductive or obstetric history, unspecified trimester: Secondary | ICD-10-CM

## 2023-12-28 DIAGNOSIS — O24414 Gestational diabetes mellitus in pregnancy, insulin controlled: Secondary | ICD-10-CM

## 2023-12-28 DIAGNOSIS — O99213 Obesity complicating pregnancy, third trimester: Secondary | ICD-10-CM | POA: Insufficient documentation

## 2023-12-28 DIAGNOSIS — O09893 Supervision of other high risk pregnancies, third trimester: Secondary | ICD-10-CM | POA: Diagnosis not present

## 2023-12-28 DIAGNOSIS — O10013 Pre-existing essential hypertension complicating pregnancy, third trimester: Secondary | ICD-10-CM

## 2023-12-28 DIAGNOSIS — O99353 Diseases of the nervous system complicating pregnancy, third trimester: Secondary | ICD-10-CM | POA: Diagnosis not present

## 2023-12-28 DIAGNOSIS — I10 Essential (primary) hypertension: Secondary | ICD-10-CM

## 2023-12-28 DIAGNOSIS — O403XX0 Polyhydramnios, third trimester, not applicable or unspecified: Secondary | ICD-10-CM | POA: Diagnosis not present

## 2023-12-28 DIAGNOSIS — G40909 Epilepsy, unspecified, not intractable, without status epilepticus: Secondary | ICD-10-CM | POA: Insufficient documentation

## 2023-12-28 DIAGNOSIS — O24912 Unspecified diabetes mellitus in pregnancy, second trimester: Secondary | ICD-10-CM

## 2023-12-28 DIAGNOSIS — O09523 Supervision of elderly multigravida, third trimester: Secondary | ICD-10-CM | POA: Diagnosis not present

## 2023-12-28 DIAGNOSIS — O09522 Supervision of elderly multigravida, second trimester: Secondary | ICD-10-CM

## 2023-12-28 DIAGNOSIS — Z79899 Other long term (current) drug therapy: Secondary | ICD-10-CM | POA: Diagnosis not present

## 2023-12-28 DIAGNOSIS — O99891 Other specified diseases and conditions complicating pregnancy: Secondary | ICD-10-CM | POA: Insufficient documentation

## 2023-12-28 DIAGNOSIS — Z3A35 35 weeks gestation of pregnancy: Secondary | ICD-10-CM | POA: Insufficient documentation

## 2023-12-28 DIAGNOSIS — E611 Iron deficiency: Secondary | ICD-10-CM

## 2023-12-28 DIAGNOSIS — N182 Chronic kidney disease, stage 2 (mild): Secondary | ICD-10-CM

## 2023-12-28 DIAGNOSIS — O409XX0 Polyhydramnios, unspecified trimester, not applicable or unspecified: Secondary | ICD-10-CM

## 2023-12-28 NOTE — Patient Instructions (Signed)
 Medication Instructions:  Your physician recommends that you continue on your current medications as directed. Please refer to the Current Medication list given to you today.  *If you need a refill on your cardiac medications before your next appointment, please call your pharmacy*  Lab Work: CMET, Mag, CBC, Iron  panel If you have labs (blood work) drawn today and your tests are completely normal, you will receive your results only by: MyChart Message (if you have MyChart) OR A paper copy in the mail If you have any lab test that is abnormal or we need to change your treatment, we will call you to review the results.  Follow-Up: At [facility], you and your health needs are our priority.  As part of our continuing mission to provide you with exceptional heart care, our providers are all part of one team.  This team includes your primary Cardiologist (physician) and Advanced Practice Providers or APPs (Physician Assistants and Nurse Practitioners) who all work together to provide you with the care you need, when you need it.  Your next appointment:   4 week(s)  Provider:   Kardie Tobb, DO

## 2023-12-28 NOTE — Progress Notes (Signed)
 Maternal-Fetal Medicine Consultation  Name: Selena Meyer  MRN: [id number]  GA: [id number] [redacted]w[redacted]d   Patient is here for antenatal testing.  Gestational diabetes.  Patient takes Lantus  insulin  12 units in the morning and 8 units at night.  She takes NovoLog  6/6/8 units with meals.  She reports her fasting levels are below 96 mg/dL and postprandial levels are around 151 mg/dL.    Chronic hypertension.  Patient takes labetalol  hydralazine  and nifedipine .  Blood pressure today at our office is 132/76 mmHg.  She does not have signs and symptoms of severe features of preeclampsia.  History of seizure disorder.  Patient takes Trileptal  and reports no recent seizure episodes.  Ultrasound Normal amniotic fluid.  Cephalic presentation.  Antenatal testing reveals.  BPP 8/8. I reassured the patient of the findings.  We discussed gestational diabetes, insulin  adjustments, hypoglycemia correction.  We discussed induction of labor.  Patient reports that she will be scheduled for induction of labor at [redacted] weeks gestation.  Recommendations - BPP next week.     Consultation including face-to-face (more than 50%) counseling 15 minutes.

## 2023-12-30 NOTE — Progress Notes (Unsigned)
 "  Cardio-Obstetrics Clinic  Follow Up Note   Date:  12/31/2023   ID:  Selena Meyer, DOB 1985-07-03, MRN [id number]  PCP:  Freddrick Johns   [facility] Providers Cardiologist:  Dub Huntsman, DO  Electrophysiologist:  None        Referring MD: No ref. provider found   Chief Complaint:  I am doing well  History of Present Illness:    Selena Meyer is a 38 y.o. female [G2P0101] who returns for follow up for postpartum cardiovascular care.    Medical includes SVT, Hypertension, hypertriglyceridemia, FSGS, gestational diabetes and seizures.   It has been difficult this pregnancy to control her blood pressure as it has been fluctuating.   She is [redacted] weeks pregnant and monitored for hypertension, with home blood pressures around 137/76 mmHg. She takes Labetalol  500 mg three times daily and Hydralazine  60 mg in the morning and 90 mg at night, with this regimen recently adjusted  and intended to be communicated here. She feels tired and ready for induction but has no weakness or dizziness.    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    Gestational diabetes    Hypertension    Hypokalemia 11/02/2020   Irregular menstrual cycle    Irregular periods/menstrual cycles 05/03/2017   Polycystic ovaries 11/09/2015   Proteinuria 02/28/2013   Stage 2 chronic kidney disease 04/27/2020   SVT (supraventricular tachycardia) 11/01/2020   Systolic murmur 07/16/2017   Tobacco abuse 02/28/2013   Uncontrolled hypertension 05/10/2017    Past Surgical History:  Procedure Laterality Date   NO PAST SURGERIES         OB History     Gravida  2   Para  1   Term      Preterm  1   AB      Living  1      SAB      IAB      Ectopic      Multiple  0   Live Births  1               Current Medications: Current Meds  Medication Sig   Accu-Chek Softclix Lancets lancets 1 each by Other route 4 (four) times daily.   acetaminophen  (TYLENOL ) 500 MG tablet Take 500 mg by mouth as needed.   albuterol  (VENTOLIN  HFA) 108 (90 Base) MCG/ACT inhaler Inhale 2 puffs into the lungs every 6 (six) hours as needed for wheezing or shortness of breath.   aspirin  EC 81 MG tablet Take 2 tablets (162 mg total) by mouth daily. Swallow whole.   Blood Glucose Monitoring Suppl (ACCU-CHEK GUIDE) w/Device KIT 1 Device by Does not apply route 4 (four) times daily.   ferrous sulfate  325 (65 FE) MG tablet Take 1 tablet (325 mg total) by mouth every other day.   folic acid  (FOLVITE ) 1 MG tablet Take 3 tablets (3 mg total) by mouth daily.   glucose blood test strip Check blood sugars 4x daily   hydrALAZINE  (APRESOLINE ) 10 MG tablet Take 1 tablet (10 mg total) by mouth 2 (two) times daily.   insulin  aspart (NOVOLOG  FLEXPEN)  100 UNIT/ML FlexPen Inject 6 Units into the skin with breakfast, with lunch, and with evening meal. With lunch & dinner (Patient taking differently: Inject 6 Units into the skin with breakfast, with lunch, and with evening meal. With breakfast and lunch, 8 units with dinner)   insulin  glargine (LANTUS ) 100 UNIT/ML Solostar Pen Inject 12 Units into the skin daily.   Insulin  Pen Needle (PEN NEEDLES) 32G X 4 MM MISC 1 Units by Does not apply route in the morning, at noon, in the evening, and at bedtime.   labetalol  (NORMODYNE ) 200 MG tablet Take 2 tablets (400 mg total) by mouth 3 (three) times daily. (Patient taking differently: Take 500 mg by mouth 3 (three) times daily.)   metoCLOPramide  (REGLAN ) 10 MG tablet Take 1 tablet (10 mg total) by mouth 4 (four) times daily as needed for nausea or vomiting.    Midazolam  (NAYZILAM ) 5 MG/0.1ML SOLN Place 5 mg into the nose as needed (For seizure cluster or seizure lasting more than 5 minutes).   NIFEdipine  (ADALAT  CC) 60 MG 24 hr tablet Take 60mg  in the morning and 90 mg in the evening (1 and 1/2 tablets)   ondansetron  (ZOFRAN -ODT) 8 MG disintegrating tablet Take 0.5 tablets (4 mg total) by mouth every 8 (eight) hours as needed for nausea.   oxcarbazepine  (TRILEPTAL ) 600 MG tablet Take 1.5 tablets (900 mg total) by mouth 2 (two) times daily.   Prenatal Vit-Fe Fumarate-FA (MULTIVITAMIN-PRENATAL) 27-0.8 MG TABS tablet Take 1 tablet by mouth daily at 12 noon.     Allergies:   Ibuprofen   Social History   Socioeconomic History   Marital status: Married    Spouse name: Marsha   Number of children: 0   Years of education: Not on file   Highest education level: Not on file  Occupational History    Employer: UNEMPLOYED  Tobacco Use   Smoking status: Former    Current packs/day: 0.00    Average packs/day: 0.3 packs/day for 5.0 years (1.3 ttl pk-yrs)    Types: Cigarettes    Start date: 05/16/2016    Quit date: 05/16/2021    Years since quitting: 2.6   Smokeless tobacco: Never   Tobacco comments:    Quit smoking 14-Jun-2023after her mom passed away  Vaping Use   Vaping status: Never Used  Substance and Sexual Activity   Alcohol use: Not Currently    Comment: occasional use; social drinker   Drug use: No   Sexual activity: Yes    Partners: Male    Birth control/protection: None    Comment: Pregnant  Other Topics Concern   Not on file  Social History Narrative   Not on file   Social Drivers of Health   Tobacco Use: Medium Risk (12/28/2023)   Patient History    Smoking Tobacco Use: Former    Smokeless Tobacco Use: Never    Passive Exposure: Not on file  Financial Resource Strain: Low Risk (05/08/2022)   Received from [facility]   Overall Financial Resource Strain (CARDIA)    Difficulty of Paying Living Expenses: Not very hard  Food  Insecurity: No Food Insecurity (09/28/2022)   Hunger Vital Sign    Worried About Running Out of Food in the Last Year: Never true    Ran Out of Food in the Last Year: Never true  Transportation Needs: No Transportation Needs (09/28/2022)   PRAPARE - Administrator, Civil Service (Medical): No    Lack of Transportation (Non-Medical):  No  Physical Activity: Not on file  Stress: Not on file  Social Connections: Not on file  Depression (PHQ2-9): Low Risk (11/14/2023)   Depression (PHQ2-9)    PHQ-2 Score: 4  Alcohol Screen: Not on file  Housing: Patient Declined (09/28/2022)   Housing    Last Housing Risk Score: 0  Utilities: Not At Risk (09/28/2022)   AHC Utilities    Threatened with loss of utilities: No  Health Literacy: Not on file      Family History  Problem Relation Age of Onset   Diabetes Mother    High blood pressure Mother    Renal Disease Mother    Diabetes Sister    Cancer Maternal Grandmother    Diabetes Maternal Grandmother    High blood pressure Maternal Grandmother    Cancer Maternal Aunt        breast   Other Father       ROS:   Please see the history of present illness.     All other systems reviewed and are negative.   Labs/EKG Reviewed:    EKG:   EKG is was not ordered today.    Recent Labs: 10/04/2023: TSH 0.894 11/14/2023: Hemoglobin 10.0; Platelets 341 11/28/2023: ALT 14; BUN 13; Creatinine, Ser 1.05; Potassium 4.5; Sodium 135   Recent Lipid Panel Lab Results  Component Value Date/Time   CHOL 240 (H) 11/15/2021 12:00 AM   TRIG 492 (H) 11/15/2021 12:00 AM   HDL 40 (L) 11/15/2021 12:00 AM   CHOLHDL 6.0 (H) 11/15/2021 12:00 AM   LDLCALC  11/15/2021 12:00 AM     Comment:     . LDL cholesterol not calculated. Triglyceride levels greater than 400 mg/dL invalidate calculated LDL results. . Reference range: <100 . Desirable range <100 mg/dL for primary prevention;   <70 mg/dL for patients with CHD or diabetic patients  with > or =  2 CHD risk factors. SABRA LDL-C is now calculated using the Martin-Hopkins  calculation, which is a validated novel method providing  better accuracy than the Friedewald equation in the  estimation of LDL-C.  Gladis APPLETHWAITE et al. SANDREA. 7986;689(80): 2061-2068  (http://education.[employer].com/faq/FAQ164)     Physical Exam:    VS:  BP (!) 142/88   Pulse 94   Ht 5' 4 (1.626 m)   Wt 199 lb 14.4 oz (90.7 kg)   LMP 04/27/2023 (Exact Date)   SpO2 96%   BMI 34.31 kg/m     Wt Readings from Last 3 Encounters:  12/31/23 197 lb (89.4 kg)  12/28/23 199 lb 14.4 oz (90.7 kg)  12/25/23 198 lb (89.8 kg)     GEN:  Well nourished, well developed in no acute distress HEENT: Normal NECK: No JVD; No carotid bruits LYMPHATICS: No lymphadenopathy CARDIAC: RRR, no murmurs, rubs, gallops RESPIRATORY:  Clear to auscultation without rales, wheezing or rhonchi  ABDOMEN: Soft, non-tender, non-distended MUSCULOSKELETAL:  No edema; No deformity  SKIN: Warm and dry NEUROLOGIC:  Alert and oriented x 3 PSYCHIATRIC:  Normal affect    Risk Assessment/Risk Calculators:                  ASSESSMENT & PLAN:    Chronic hypertension in pregnancy FSGC Seizure Disorder CKD Hx of gestational diabetes  Gestational hypertension, third trimester Blood pressure controlled with medication. Home readings 137/76 mmHg, office 142/88 mmHg. No dizziness or weakness. - Continue antihypertensive regimen:  Labetalol  500 mg TID, hydralazine  60 mg AM and 90 mg PM.  Anemia complicating pregnancy, third  trimester Pallor suggests anemia. Previous iron  levels elevated, current status unknown. - Order iron  panel, ferritin, CBC. - Advised lab work completion today or Monday.  [redacted] weeks gestation, prenatal care [redacted] weeks gestation. Induction planned for 37 weeks. No changes to plan. - Continue planned induction at 37 weeks.  Continue Asprin for preeclampsia prophylaxis  - will defer to our OBGYN and MFM for plan  delivery around 36-38 weeks due to history of severe preeclampsia.    She needs a close follow-up.  Will see the patient in 1 week.   Patient Instructions  Medication Instructions:  Your physician recommends that you continue on your current medications as directed. Please refer to the Current Medication list given to you today.  *If you need a refill on your cardiac medications before your next appointment, please call your pharmacy*  Lab Work: CMET, Mag, CBC, Iron  panel If you have labs (blood work) drawn today and your tests are completely normal, you will receive your results only by: MyChart Message (if you have MyChart) OR A paper copy in the mail If you have any lab test that is abnormal or we need to change your treatment, we will call you to review the results.  Follow-Up: At [facility], you and your health needs are our priority.  As part of our continuing mission to provide you with exceptional heart care, our providers are all part of one team.  This team includes your primary Cardiologist (physician) and Advanced Practice Providers or APPs (Physician Assistants and Nurse Practitioners) who all work together to provide you with the care you need, when you need it.  Your next appointment:   4 week(s)  Provider:   Janie Strothman, DO       Dispo:  No follow-ups on file.   Medication Adjustments/Labs and Tests Ordered: Current medicines are reviewed at length with the patient today.  Concerns regarding medicines are outlined above.  Tests Ordered: Orders Placed This Encounter  Procedures   Comp Met (CMET)   Magnesium    CBC   Iron , TIBC and Ferritin Panel   Medication Changes: No orders of the defined types were placed in this encounter.  "

## 2023-12-31 ENCOUNTER — Ambulatory Visit: Admitting: Obstetrics and Gynecology

## 2023-12-31 ENCOUNTER — Other Ambulatory Visit (HOSPITAL_COMMUNITY)
Admission: RE | Admit: 2023-12-31 | Discharge: 2023-12-31 | Disposition: A | Discharge status: Home / Self Care | Source: Ambulatory Visit | Attending: Obstetrics and Gynecology | Admitting: Obstetrics and Gynecology

## 2023-12-31 VITALS — BP 113/73 | HR 97 | Wt 197.0 lb

## 2023-12-31 DIAGNOSIS — D509 Iron deficiency anemia, unspecified: Secondary | ICD-10-CM

## 2023-12-31 DIAGNOSIS — I10 Essential (primary) hypertension: Secondary | ICD-10-CM

## 2023-12-31 DIAGNOSIS — O10013 Pre-existing essential hypertension complicating pregnancy, third trimester: Secondary | ICD-10-CM | POA: Diagnosis not present

## 2023-12-31 DIAGNOSIS — R809 Proteinuria, unspecified: Secondary | ICD-10-CM

## 2023-12-31 DIAGNOSIS — Z3A35 35 weeks gestation of pregnancy: Secondary | ICD-10-CM | POA: Diagnosis not present

## 2023-12-31 DIAGNOSIS — I471 Supraventricular tachycardia, unspecified: Secondary | ICD-10-CM

## 2023-12-31 DIAGNOSIS — O09523 Supervision of elderly multigravida, third trimester: Secondary | ICD-10-CM | POA: Diagnosis not present

## 2023-12-31 DIAGNOSIS — O24414 Gestational diabetes mellitus in pregnancy, insulin controlled: Secondary | ICD-10-CM | POA: Diagnosis not present

## 2023-12-31 DIAGNOSIS — O3660X0 Maternal care for excessive fetal growth, unspecified trimester, not applicable or unspecified: Secondary | ICD-10-CM

## 2023-12-31 DIAGNOSIS — O99013 Anemia complicating pregnancy, third trimester: Secondary | ICD-10-CM | POA: Diagnosis not present

## 2023-12-31 DIAGNOSIS — G40909 Epilepsy, unspecified, not intractable, without status epilepticus: Secondary | ICD-10-CM | POA: Diagnosis not present

## 2023-12-31 DIAGNOSIS — O0990 Supervision of high risk pregnancy, unspecified, unspecified trimester: Secondary | ICD-10-CM

## 2023-12-31 DIAGNOSIS — I517 Cardiomegaly: Secondary | ICD-10-CM

## 2023-12-31 DIAGNOSIS — I129 Hypertensive chronic kidney disease with stage 1 through stage 4 chronic kidney disease, or unspecified chronic kidney disease: Secondary | ICD-10-CM

## 2023-12-31 DIAGNOSIS — N051 Unspecified nephritic syndrome with focal and segmental glomerular lesions: Secondary | ICD-10-CM

## 2023-12-31 NOTE — Progress Notes (Addendum)
 "  PRENATAL VISIT NOTE  Subjective:  MCKENSIE SCOTTI is a 38 y.o. G2P0101 at [redacted]w[redacted]d being seen today for ongoing prenatal care.  She is currently monitored for the following issues for this high-risk pregnancy and has FSGS (focal segmental glomerulosclerosis); Maternal iron  deficiency anemia complicating pregnancy; SVT (supraventricular tachycardia); Chronic hypertension; Moderate left ventricular hypertrophy; Supervision of high risk pregnancy, antepartum; Nonintractable epilepsy without status epilepticus (HCC); Hypertensive nephropathy; Proteinuria; Early GDM w/ polyhydramnios; and AMA (advanced maternal age) multigravida 35+ on their problem list.  Patient reports doing well overall. Needs IOL scheduled.  Contractions: Not present. Vag. Bleeding: None.  Movement: Present. Denies leaking of fluid.   The following portions of the patient's history were reviewed and updated as appropriate: allergies, current medications, past family history, past medical history, past social history, past surgical history and problem list.   Objective:   Vitals:   12/31/23 1405  BP: 113/73  Pulse: 97  Weight: 197 lb (89.4 kg)    Fetal Status: Fetal Heart Rate (bpm): 148   Movement: Present     General:  Alert, oriented and cooperative. Patient is in no acute distress.  Skin: Skin is warm and dry. No rash noted.   Cardiovascular: Normal heart rate noted  Respiratory: Normal respiratory effort, no problems with respiration noted  Abdomen: Soft, gravid, appropriate for gestational age.  Pain/Pressure: Absent      Assessment and Plan:  Pregnancy: G2P0101 at [redacted]w[redacted]d 1. Supervision of high risk pregnancy, antepartum (Primary) 2. [redacted] weeks gestation of pregnancy GBS/GC/CT today Will be <14d from delivery for RSV vaccine NST reactive and reassuring today  3. Insulin  controlled gestational diabetes mellitus (GDM) in third trimester 22. LGA fetus Semglee  12u qAM & NVG 6/6/8. Reports pre-meal values are low and  making her feel bad. Is skipping meal time insulin  or taking it after meals. No postprandial lows. Will back off long acting insulin  to semglee  10u qAM, continue meal 6/6/8 Normal fetal echo Growth 12/12 @ [redacted]w[redacted]d 2784g (91%), next scheduled 12/26 IOL ordered for 1/4 (first available at 37 weeks), will try to get moved sooner  4. Chronic hypertension Baseline Cr 0.82, PCR 1713 Current meds: labetalol  500 TID, nifedipine  60/90, hydral 10mg  BID w/ option for TID dosing if midday BP is high ldASA 162mg  F/w Tobb - no echo needed this pregnancy  5. FSGS (focal segmental glomerulosclerosis) 6. Hypertensive nephropathy 7. Proteinuria, unspecified type F/w [facility] q6 mos Dr Manuelita Barters -. 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  Baseline proteinuria 1731; Cr 0.82-1.07 Last seen 9/22 with no changes to plan/medications. Next appt postpartum (04/08/24)  8. SVT (supraventricular tachycardia) 9. Moderate left ventricular hypertrophy F/w Tobb  10. Maternal iron  deficiency anemia complicating pregnancy in third trimester Hgb 10.1 on 11/15, will repeat CBC today S/p IV iron  but most recent ferritin was 166  11. 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 Multigravida of advanced maternal age in third trimester  52. Sterilization consult Discussed process for BTL; reviewed potential that BTL cannot be completed in patient and that we will need back up option in that case  Please refer to After Visit Summary for other counseling recommendations.   Return in about 1 week (around 01/07/2024) for return OB at 36 weeks with MD.  Future  Appointments  Date Time Provider Department Center  01/04/2024 12:00  PM Darrell Bruckner, [facility] H&V  01/04/2024  2:15 PM [facility] PROVIDER 1 [facility]  01/04/2024  2:30 PM [facility] US1 [facility]  01/09/2024  3:30 PM [facility] NURSE [facility]  01/09/2024  4:10 PM Constant, Peggy, MD [facility]  01/25/2024 10:40 AM Tobb, Dub, DO [facility] H&V  02/13/2024  3:00 PM [facility] LAB [facility] None  02/13/2024  3:15 PM Tonette Lauraine HERO, PA-C [facility] None  05/08/2024  2:45 PM Gregg Lek, MD [facility] None   Kieth JAYSON Carolin, MD  "

## 2024-01-01 ENCOUNTER — Encounter: Admitting: Advanced Practice Midwife

## 2024-01-01 LAB — CERVICOVAGINAL ANCILLARY ONLY
Chlamydia: NEGATIVE
Comment: NEGATIVE
Comment: NORMAL
Neisseria Gonorrhea: NEGATIVE

## 2024-01-02 ENCOUNTER — Ambulatory Visit: Payer: Self-pay | Admitting: Obstetrics and Gynecology

## 2024-01-02 LAB — STREP GP B NAA: Strep Gp B NAA: NEGATIVE

## 2024-01-02 NOTE — Addendum Note (Signed)
 Addended by: ERIK FELTS on: 01/02/2024 04:27 PM   Modules accepted: Orders

## 2024-01-04 ENCOUNTER — Encounter: Payer: Self-pay | Admitting: Pharmacist

## 2024-01-04 ENCOUNTER — Ambulatory Visit: Admitting: Maternal & Fetal Medicine

## 2024-01-04 ENCOUNTER — Encounter (HOSPITAL_COMMUNITY): Payer: Self-pay | Admitting: *Deleted

## 2024-01-04 ENCOUNTER — Ambulatory Visit: Admitting: Pharmacist

## 2024-01-04 ENCOUNTER — Telehealth (HOSPITAL_COMMUNITY): Payer: Self-pay | Admitting: *Deleted

## 2024-01-04 ENCOUNTER — Ambulatory Visit

## 2024-01-04 VITALS — BP 120/72 | HR 91

## 2024-01-04 VITALS — BP 124/65 | HR 90

## 2024-01-04 DIAGNOSIS — O99213 Obesity complicating pregnancy, third trimester: Secondary | ICD-10-CM

## 2024-01-04 DIAGNOSIS — O09293 Supervision of pregnancy with other poor reproductive or obstetric history, third trimester: Secondary | ICD-10-CM | POA: Insufficient documentation

## 2024-01-04 DIAGNOSIS — O352XX0 Maternal care for (suspected) hereditary disease in fetus, not applicable or unspecified: Secondary | ICD-10-CM | POA: Insufficient documentation

## 2024-01-04 DIAGNOSIS — O09213 Supervision of pregnancy with history of pre-term labor, third trimester: Secondary | ICD-10-CM | POA: Insufficient documentation

## 2024-01-04 DIAGNOSIS — O99353 Diseases of the nervous system complicating pregnancy, third trimester: Secondary | ICD-10-CM | POA: Insufficient documentation

## 2024-01-04 DIAGNOSIS — O163 Unspecified maternal hypertension, third trimester: Secondary | ICD-10-CM

## 2024-01-04 DIAGNOSIS — O24912 Unspecified diabetes mellitus in pregnancy, second trimester: Secondary | ICD-10-CM

## 2024-01-04 DIAGNOSIS — O09893 Supervision of other high risk pregnancies, third trimester: Secondary | ICD-10-CM | POA: Insufficient documentation

## 2024-01-04 DIAGNOSIS — O09299 Supervision of pregnancy with other poor reproductive or obstetric history, unspecified trimester: Secondary | ICD-10-CM

## 2024-01-04 DIAGNOSIS — E669 Obesity, unspecified: Secondary | ICD-10-CM

## 2024-01-04 DIAGNOSIS — O24414 Gestational diabetes mellitus in pregnancy, insulin controlled: Secondary | ICD-10-CM

## 2024-01-04 DIAGNOSIS — Z3A36 36 weeks gestation of pregnancy: Secondary | ICD-10-CM

## 2024-01-04 DIAGNOSIS — G40909 Epilepsy, unspecified, not intractable, without status epilepticus: Secondary | ICD-10-CM | POA: Insufficient documentation

## 2024-01-04 DIAGNOSIS — O10013 Pre-existing essential hypertension complicating pregnancy, third trimester: Secondary | ICD-10-CM | POA: Insufficient documentation

## 2024-01-04 DIAGNOSIS — O09523 Supervision of elderly multigravida, third trimester: Secondary | ICD-10-CM | POA: Insufficient documentation

## 2024-01-04 DIAGNOSIS — O09522 Supervision of elderly multigravida, second trimester: Secondary | ICD-10-CM

## 2024-01-04 DIAGNOSIS — O403XX0 Polyhydramnios, third trimester, not applicable or unspecified: Secondary | ICD-10-CM | POA: Insufficient documentation

## 2024-01-04 DIAGNOSIS — O409XX0 Polyhydramnios, unspecified trimester, not applicable or unspecified: Secondary | ICD-10-CM

## 2024-01-04 DIAGNOSIS — I10 Essential (primary) hypertension: Secondary | ICD-10-CM

## 2024-01-04 NOTE — Progress Notes (Signed)
 "  Patient information  Patient Name: Selena Meyer  Patient MRN:   [id number]  Referring practice: MFM Referring Provider: [facility]  Problem List   Patient Active Problem List   Diagnosis Date Noted   AMA (advanced maternal age) multigravida 35+ 08/22/2023   Early GDM w/ polyhydramnios 08/21/2023   Hypertensive nephropathy 08/10/2023   Proteinuria 08/10/2023   Nonintractable epilepsy without status epilepticus (HCC) 07/28/2023   Supervision of high risk pregnancy, antepartum 07/25/2023   Moderate left ventricular hypertrophy 05/19/2022   Chronic hypertension 05/09/2022   SVT (supraventricular tachycardia) 11/01/2020   Maternal iron  deficiency anemia complicating pregnancy 04/27/2020   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 G2P0101 at [redacted]w[redacted]d here for ultrasound and consultation. Dierdre A SPIELBERG is doing well today with no acute concerns. Today we focused on the following:   The patient presents for a biophysical profile ultrasound, both of which are normal today. She has a planned delivery at [redacted] weeks gestation. We discussed fetal movement precautions and the importance of ongoing blood pressure monitoring. She has no signs or symptoms of preeclampsia today. Review of her blood glucose log demonstrates suboptimal glycemic control, with most fasting values ranging from 90 to 100 mg/dL and two-hour postprandial values ranging from 120 to 175 mg/dL, with the majority between 130 and 150 mg/dL. Given the suboptimal glycemic control in the setting of advanced maternal age (38 years) and chronic hypertension, we discussed that delivery at 37 weeks is appropriate. An alternative option of delivery at 36 weeks was reviewed; however, the patient declined this option and understands the importance of monitoring fetal movement and presenting to the hospital with any concerns.  Sonographic findings Single  intrauterine pregnancy at 36w 0d. Fetal cardiac activity: Observed. Presentation: Cephalic. Interval fetal anatomy appears normal. Amniotic fluid: Within normal limits. AFI: 18.48 cm,  MVP: 8.68 cm. Placenta: Posterior. BPP: 8/8.   RECOMMENDATIONS -Continue antenatal surveillance as scheduled -Proceed with planned delivery at [redacted] weeks gestation -Continue close blood pressure monitoring -Continue blood glucose monitoring and management -Review and follow fetal movement precautions -Present to the hospital immediately for decreased fetal movement or other concerning symptoms  The patient had time to ask questions that were answered to her satisfaction.  She verbalized understanding and agrees to proceed with the plan above.   I spent 30 minutes reviewing the patients chart, including labs and images as well as counseling the patient about her medical conditions. Greater than 50% of the time was spent in direct face-to-face patient counseling.  Delora Smaller  MFM, [place]   01/04/2024  3:16 PM   Review of Systems: A review of systems was performed and was negative except per HPI   Vitals and Physical Exam    01/04/2024    2:29 PM 01/04/2024   12:21 PM 01/04/2024   12:15 PM  Vitals with BMI  Systolic 124 120 874  Diastolic 65 72 79  Pulse 90  91    Sitting comfortably on the sonogram table Nonlabored breathing Normal rate and rhythm Abdomen is nontender  Past pregnancies OB History  Gravida Para Term Preterm AB Living  2 1  1  1   SAB IAB Ectopic Multiple Live Births     0 1    # Outcome Date GA Lbr Len/2nd Weight Sex Type Anes PTL Lv  2 Current           1 Preterm  09/29/22 [redacted]w[redacted]d 09:17 / 00:58 4 lb 9.4 oz (2.08 kg) F Vag-Spont EPI  LIV     Future Appointments  Date Time Provider Department Center  01/09/2024  3:30 PM [facility] NURSE [facility]  01/09/2024  4:10 PM Constant, Peggy, MD [facility]  01/12/2024 12:00 AM [facility] SCHED ROOM  [facility] None  01/25/2024 10:40 AM Tobb, Dub, DO [facility] H&V  02/13/2024  3:00 PM [facility] LAB [facility] None  02/13/2024  3:15 PM Tonette Lauraine HERO, PA-C [facility] None  05/08/2024  2:45 PM Gregg Lek, MD [facility] None      "

## 2024-01-04 NOTE — Telephone Encounter (Signed)
 Preadmission screen

## 2024-01-04 NOTE — Patient Instructions (Addendum)
 SABRA

## 2024-01-04 NOTE — Progress Notes (Signed)
 Patient ID: Selena Meyer                 DOB: 1985-04-06                      MRN: [id number]     HPI: Selena Meyer is a 38 y.o. female referred yo Cardio OB clinic.  PMH is significant for HTN in pregnancy, FSGS, GDM, and epilepsy.  Patient [redacted]w[redacted]d today  Patient scheduled for induction on 01/11/24.  Presents today for HTN follow up. Nauseas in waiting room. Vomited twice yesterday.   Checked BG in room :123. Took morning medications around 10am today. Husband reports she did not sleep well last night.  Ate rice, collards, and dressing this morning. Has not drank much water today.  Has OB appt this afternoon.   Current HTN meds:  Labetalol  500mg  TID Hydralazine  10mg  BID Nifedipine  60mg  in morning, 90mg  in evening   Wt Readings from Last 3 Encounters:  12/31/23 197 lb (89.4 kg)  12/28/23 199 lb 14.4 oz (90.7 kg)  12/25/23 198 lb (89.8 kg)   BP Readings from Last 3 Encounters:  12/31/23 113/73  12/28/23 (!) 142/88  12/28/23 132/76   Pulse Readings from Last 3 Encounters:  12/31/23 97  12/28/23 94  12/28/23 83    Renal function: CrCl cannot be calculated (Patient's most recent lab result is older than the maximum 21 days allowed.).  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    Gestational diabetes    Hypertension    Hypokalemia 11/02/2020   Irregular menstrual cycle    Irregular periods/menstrual cycles 05/03/2017   Polycystic ovaries 11/09/2015   Proteinuria 02/28/2013   Seizures (HCC)    12.24 takes meds   Stage 2 chronic kidney disease 04/27/2020   SVT (supraventricular tachycardia) 11/01/2020   Systolic murmur 07/16/2017   Tobacco abuse 02/28/2013    Uncontrolled hypertension 05/10/2017    Medications Ordered Prior to Encounter[1]  Allergies[2]   Assessment/Plan:  1. Hypertension -  Patient BP controlled today at 125/79. Manually rechecked at 120/72.  Discussed reporting to MAU since she feels poorly but she declines. No med changes needed at this time.   F/u with OB this afternoon  Continue: Labetalol  500mg  TID Hydralazine  10mg  BID Nifedipine  60mg  in morning, 90mg  in evening  Medford Bolk, PharmD, [place], CDCES, CPP [facility] [address] [place], [place] [address] Phone: [phone]; Fax: [phone] 01/04/2024 12:51 PM      [1]  Current Outpatient Medications on File Prior to Visit  Medication Sig Dispense Refill   Accu-Chek Softclix Lancets lancets 1 each by Other route 4 (four) times daily. 200 each 3   acetaminophen  (TYLENOL ) 500 MG tablet Take 500 mg by mouth as needed.     albuterol  (VENTOLIN  HFA) 108 (90 Base) MCG/ACT inhaler Inhale 2 puffs into the lungs every 6 (six) hours as needed for wheezing or shortness of breath. 1 each 1   aspirin  EC 81 MG tablet Take 2 tablets (162 mg total) by mouth daily. Swallow whole. 60 tablet 11   Blood Glucose Monitoring Suppl (ACCU-CHEK GUIDE) w/Device KIT 1 Device by Does not apply route 4 (four) times daily. 1 kit 0  ferrous sulfate  325 (65 FE) MG tablet Take 1 tablet (325 mg total) by mouth every other day. 100 tablet 3   folic acid  (FOLVITE ) 1 MG tablet Take 3 tablets (3 mg total) by mouth daily. 270 tablet 3   glucose blood test strip Check blood sugars 4x daily 200 each 3   hydrALAZINE  (APRESOLINE ) 10 MG tablet Take 1 tablet (10 mg total) by mouth 2 (two) times daily. 60 tablet 5   insulin  aspart (NOVOLOG  FLEXPEN) 100 UNIT/ML FlexPen Inject 6 Units into the skin with breakfast, with lunch, and with evening meal. With lunch & dinner (Patient taking differently: Inject 6 Units into the skin with breakfast, with lunch, and with evening meal. With breakfast  and lunch, 8 units with dinner) 9 mL 3   insulin  glargine (LANTUS ) 100 UNIT/ML Solostar Pen Inject 12 Units into the skin daily. 15 mL 2   Insulin  Pen Needle (PEN NEEDLES) 32G X 4 MM MISC 1 Units by Does not apply route in the morning, at noon, in the evening, and at bedtime. 50 each 1   labetalol  (NORMODYNE ) 200 MG tablet Take 2 tablets (400 mg total) by mouth 3 (three) times daily. (Patient taking differently: Take 500 mg by mouth 3 (three) times daily.) 540 tablet 3   metoCLOPramide  (REGLAN ) 10 MG tablet Take 1 tablet (10 mg total) by mouth 4 (four) times daily as needed for nausea or vomiting. 30 tablet 2   Midazolam  (NAYZILAM ) 5 MG/0.1ML SOLN Place 5 mg into the nose as needed (For seizure cluster or seizure lasting more than 5 minutes). 2 each 0   NIFEdipine  (ADALAT  CC) 60 MG 24 hr tablet Take 60mg  in the morning and 90 mg in the evening (1 and 1/2 tablets)     ondansetron  (ZOFRAN -ODT) 8 MG disintegrating tablet Take 0.5 tablets (4 mg total) by mouth every 8 (eight) hours as needed for nausea. 30 tablet 2   oxcarbazepine  (TRILEPTAL ) 600 MG tablet Take 1.5 tablets (900 mg total) by mouth 2 (two) times daily. 90 tablet 2   Prenatal Vit-Fe Fumarate-FA (MULTIVITAMIN-PRENATAL) 27-0.8 MG TABS tablet Take 1 tablet by mouth daily at 12 noon.     No current facility-administered medications on file prior to visit.  [2]  Allergies Allergen Reactions   Ibuprofen Hives and Rash

## 2024-01-06 ENCOUNTER — Inpatient Hospital Stay (HOSPITAL_COMMUNITY): Admitting: Anesthesiology

## 2024-01-06 ENCOUNTER — Other Ambulatory Visit: Payer: Self-pay

## 2024-01-06 ENCOUNTER — Encounter (HOSPITAL_COMMUNITY): Payer: Self-pay | Admitting: Obstetrics & Gynecology

## 2024-01-06 ENCOUNTER — Inpatient Hospital Stay (HOSPITAL_COMMUNITY)
Admission: AD | Admit: 2024-01-06 | Discharge: 2024-01-10 | DRG: 768 | Disposition: A | Discharge status: Home / Self Care | Attending: Obstetrics and Gynecology | Admitting: Obstetrics and Gynecology

## 2024-01-06 DIAGNOSIS — D509 Iron deficiency anemia, unspecified: Secondary | ICD-10-CM | POA: Diagnosis present

## 2024-01-06 DIAGNOSIS — O114 Pre-existing hypertension with pre-eclampsia, complicating childbirth: Secondary | ICD-10-CM | POA: Diagnosis present

## 2024-01-06 DIAGNOSIS — Z560 Unemployment, unspecified: Secondary | ICD-10-CM | POA: Diagnosis not present

## 2024-01-06 DIAGNOSIS — I129 Hypertensive chronic kidney disease with stage 1 through stage 4 chronic kidney disease, or unspecified chronic kidney disease: Secondary | ICD-10-CM | POA: Diagnosis present

## 2024-01-06 DIAGNOSIS — G40909 Epilepsy, unspecified, not intractable, without status epilepticus: Secondary | ICD-10-CM

## 2024-01-06 DIAGNOSIS — O9081 Anemia of the puerperium: Secondary | ICD-10-CM | POA: Diagnosis not present

## 2024-01-06 DIAGNOSIS — O9962 Diseases of the digestive system complicating childbirth: Secondary | ICD-10-CM | POA: Diagnosis present

## 2024-01-06 DIAGNOSIS — N051 Unspecified nephritic syndrome with focal and segmental glomerular lesions: Secondary | ICD-10-CM | POA: Diagnosis present

## 2024-01-06 DIAGNOSIS — I517 Cardiomegaly: Secondary | ICD-10-CM | POA: Diagnosis present

## 2024-01-06 DIAGNOSIS — K219 Gastro-esophageal reflux disease without esophagitis: Secondary | ICD-10-CM | POA: Diagnosis present

## 2024-01-06 DIAGNOSIS — O24424 Gestational diabetes mellitus in childbirth, insulin controlled: Secondary | ICD-10-CM | POA: Diagnosis present

## 2024-01-06 DIAGNOSIS — I471 Supraventricular tachycardia, unspecified: Secondary | ICD-10-CM | POA: Diagnosis present

## 2024-01-06 DIAGNOSIS — Z79899 Other long term (current) drug therapy: Secondary | ICD-10-CM

## 2024-01-06 DIAGNOSIS — G40109 Localization-related (focal) (partial) symptomatic epilepsy and epileptic syndromes with simple partial seizures, not intractable, without status epilepticus: Secondary | ICD-10-CM | POA: Diagnosis present

## 2024-01-06 DIAGNOSIS — Z87891 Personal history of nicotine dependence: Secondary | ICD-10-CM

## 2024-01-06 DIAGNOSIS — O0990 Supervision of high risk pregnancy, unspecified, unspecified trimester: Secondary | ICD-10-CM

## 2024-01-06 DIAGNOSIS — O403XX0 Polyhydramnios, third trimester, not applicable or unspecified: Secondary | ICD-10-CM | POA: Diagnosis present

## 2024-01-06 DIAGNOSIS — Z7982 Long term (current) use of aspirin: Secondary | ICD-10-CM | POA: Diagnosis not present

## 2024-01-06 DIAGNOSIS — Z3A36 36 weeks gestation of pregnancy: Secondary | ICD-10-CM

## 2024-01-06 DIAGNOSIS — O9942 Diseases of the circulatory system complicating childbirth: Secondary | ICD-10-CM | POA: Diagnosis present

## 2024-01-06 DIAGNOSIS — O368130 Decreased fetal movements, third trimester, not applicable or unspecified: Secondary | ICD-10-CM | POA: Diagnosis present

## 2024-01-06 DIAGNOSIS — O24414 Gestational diabetes mellitus in pregnancy, insulin controlled: Secondary | ICD-10-CM

## 2024-01-06 DIAGNOSIS — Z833 Family history of diabetes mellitus: Secondary | ICD-10-CM

## 2024-01-06 DIAGNOSIS — O99354 Diseases of the nervous system complicating childbirth: Secondary | ICD-10-CM | POA: Diagnosis present

## 2024-01-06 DIAGNOSIS — N182 Chronic kidney disease, stage 2 (mild): Secondary | ICD-10-CM | POA: Diagnosis present

## 2024-01-06 DIAGNOSIS — O24919 Unspecified diabetes mellitus in pregnancy, unspecified trimester: Secondary | ICD-10-CM | POA: Diagnosis present

## 2024-01-06 DIAGNOSIS — Z302 Encounter for sterilization: Secondary | ICD-10-CM | POA: Diagnosis not present

## 2024-01-06 DIAGNOSIS — D62 Acute posthemorrhagic anemia: Secondary | ICD-10-CM | POA: Diagnosis not present

## 2024-01-06 DIAGNOSIS — I10 Essential (primary) hypertension: Secondary | ICD-10-CM | POA: Diagnosis present

## 2024-01-06 DIAGNOSIS — O09523 Supervision of elderly multigravida, third trimester: Secondary | ICD-10-CM | POA: Diagnosis not present

## 2024-01-06 DIAGNOSIS — O1414 Severe pre-eclampsia complicating childbirth: Secondary | ICD-10-CM | POA: Diagnosis present

## 2024-01-06 DIAGNOSIS — R079 Chest pain, unspecified: Secondary | ICD-10-CM | POA: Diagnosis not present

## 2024-01-06 DIAGNOSIS — O1022 Pre-existing hypertensive chronic kidney disease complicating childbirth: Secondary | ICD-10-CM | POA: Diagnosis present

## 2024-01-06 LAB — GLUCOSE, CAPILLARY: Glucose-Capillary: 72 mg/dL (ref 70–99)

## 2024-01-06 LAB — COMPREHENSIVE METABOLIC PANEL WITH GFR
ALT: 14 U/L (ref 0–44)
AST: 27 U/L (ref 15–41)
Albumin: 3.2 g/dL — ABNORMAL LOW (ref 3.5–5.0)
Alkaline Phosphatase: 117 U/L (ref 38–126)
Anion gap: 12 (ref 5–15)
BUN: 11 mg/dL (ref 6–20)
CO2: 21 mmol/L — ABNORMAL LOW (ref 22–32)
Calcium: 8.7 mg/dL — ABNORMAL LOW (ref 8.9–10.3)
Chloride: 104 mmol/L (ref 98–111)
Creatinine, Ser: 1.04 mg/dL — ABNORMAL HIGH (ref 0.44–1.00)
GFR, Estimated: >60 mL/min
Glucose, Bld: 71 mg/dL (ref 70–99)
Potassium: 4.4 mmol/L (ref 3.5–5.1)
Sodium: 137 mmol/L (ref 135–145)
Total Bilirubin: <0.2 mg/dL (ref 0.0–1.2)
Total Protein: 6.3 g/dL — ABNORMAL LOW (ref 6.5–8.1)

## 2024-01-06 LAB — CBC
HCT: 34.1 % — ABNORMAL LOW (ref 36.0–46.0)
Hemoglobin: 11.7 g/dL — ABNORMAL LOW (ref 12.0–15.0)
MCH: 30.5 pg (ref 26.0–34.0)
MCHC: 34.3 g/dL (ref 30.0–36.0)
MCV: 89 fL (ref 80.0–100.0)
Platelets: 311 K/uL (ref 150–400)
RBC: 3.83 MIL/uL — ABNORMAL LOW (ref 3.87–5.11)
RDW: 12.9 % (ref 11.5–15.5)
WBC: 11.4 K/uL — ABNORMAL HIGH (ref 4.0–10.5)
nRBC: 0 % (ref 0.0–0.2)

## 2024-01-06 LAB — PROTEIN / CREATININE RATIO, URINE
Creatinine, Urine: 55 mg/dL
Total Protein, Urine: >600 mg/dL

## 2024-01-06 MED ORDER — SOD CITRATE-CITRIC ACID 500-334 MG/5ML PO SOLN: 30.0000 mL | ORAL | Status: DC | PRN

## 2024-01-06 MED ORDER — HYDRALAZINE HCL 20 MG/ML IJ SOLN: 10.0000 mg | INTRAMUSCULAR | Status: DC | PRN

## 2024-01-06 MED ORDER — SODIUM CHLORIDE 0.9% FLUSH: 3.0000 mL | Freq: Two times a day (BID) | INTRAVENOUS | Status: DC

## 2024-01-06 MED ORDER — HYDRALAZINE HCL 10 MG PO TABS
10.0000 mg | ORAL_TABLET | Freq: Two times a day (BID) | ORAL | Status: DC
  Administered 2024-01-06 – 2024-01-07 (×2): 10 mg via ORAL
  Filled 2024-01-06 (×2): qty 1

## 2024-01-06 MED ORDER — LABETALOL HCL 200 MG PO TABS: 500.0000 mg | ORAL_TABLET | Freq: Three times a day (TID) | ORAL | Status: DC

## 2024-01-06 MED ORDER — LABETALOL HCL 5 MG/ML IV SOLN
40.0000 mg | INTRAVENOUS | Status: DC | PRN
  Administered 2024-01-07: 40 mg via INTRAVENOUS

## 2024-01-06 MED ORDER — ONDANSETRON HCL 4 MG/2ML IJ SOLN
4.0000 mg | Freq: Four times a day (QID) | INTRAMUSCULAR | Status: DC | PRN
  Administered 2024-01-07: 4 mg via INTRAVENOUS

## 2024-01-06 MED ORDER — OXYTOCIN-SODIUM CHLORIDE 30-0.9 UT/500ML-% IV SOLN
2.5000 [IU]/h | INTRAVENOUS | Status: DC
  Administered 2024-01-07: 2.5 [IU]/h via INTRAVENOUS

## 2024-01-06 MED ORDER — PHENYLEPHRINE 80 MCG/ML (10ML) SYRINGE FOR IV PUSH (FOR BLOOD PRESSURE SUPPORT)
80.0000 ug | PREFILLED_SYRINGE | INTRAVENOUS | Status: DC | PRN
  Administered 2024-01-07: 80 ug via INTRAVENOUS

## 2024-01-06 MED ORDER — SODIUM CHLORIDE 0.9 % IV SOLN: 250.0000 mL | INTRAVENOUS | Status: DC | PRN

## 2024-01-06 MED ORDER — LIDOCAINE HCL (PF) 1 % IJ SOLN: 30.0000 mL | INTRAMUSCULAR | Status: DC | PRN

## 2024-01-06 MED ORDER — MAGNESIUM SULFATE BOLUS VIA INFUSION
4.0000 g | Freq: Once | INTRAVENOUS | Status: AC
  Administered 2024-01-06: 4 g via INTRAVENOUS
  Filled 2024-01-06: qty 1000

## 2024-01-06 MED ORDER — FENTANYL-BUPIVACAINE-NACL 0.5-0.125-0.9 MG/250ML-% EP SOLN
12.0000 mL/h | EPIDURAL | Status: DC | PRN
  Administered 2024-01-07: 12 mL/h via EPIDURAL
  Filled 2024-01-06: qty 250

## 2024-01-06 MED ORDER — PHENYLEPHRINE 80 MCG/ML (10ML) SYRINGE FOR IV PUSH (FOR BLOOD PRESSURE SUPPORT): 80.0000 ug | PREFILLED_SYRINGE | INTRAVENOUS | Status: DC | PRN

## 2024-01-06 MED ORDER — LACTATED RINGERS IV SOLN: INTRAVENOUS | Status: DC

## 2024-01-06 MED ORDER — NIFEDIPINE ER OSMOTIC RELEASE 60 MG PO TB24
90.0000 mg | ORAL_TABLET | Freq: Every evening | ORAL | Status: DC
  Administered 2024-01-06: 90 mg via ORAL
  Filled 2024-01-06: qty 3

## 2024-01-06 MED ORDER — SODIUM CHLORIDE 0.9% FLUSH: 3.0000 mL | INTRAVENOUS | Status: DC | PRN

## 2024-01-06 MED ORDER — LABETALOL HCL 100 MG PO TABS
500.0000 mg | ORAL_TABLET | Freq: Once | ORAL | Status: AC
  Administered 2024-01-06: 500 mg via ORAL
  Filled 2024-01-06: qty 5

## 2024-01-06 MED ORDER — OXYTOCIN BOLUS FROM INFUSION
333.0000 mL | Freq: Once | INTRAVENOUS | Status: AC
  Administered 2024-01-07: 333 mL via INTRAVENOUS

## 2024-01-06 MED ORDER — LACTATED RINGERS IV SOLN
500.0000 mL | INTRAVENOUS | Status: DC | PRN
  Administered 2024-01-06: 1000 mL via INTRAVENOUS
  Administered 2024-01-07: 300 mL via INTRAVENOUS
  Administered 2024-01-07: 250 mL via INTRAVENOUS

## 2024-01-06 MED ORDER — LABETALOL HCL 5 MG/ML IV SOLN: 80.0000 mg | INTRAVENOUS | Status: DC | PRN

## 2024-01-06 MED ORDER — MAGNESIUM SULFATE 40 GM/1000ML IV SOLN
2.0000 g/h | INTRAVENOUS | Status: DC
  Filled 2024-01-06: qty 1000

## 2024-01-06 MED ORDER — LABETALOL HCL 5 MG/ML IV SOLN
20.0000 mg | INTRAVENOUS | Status: DC | PRN
  Administered 2024-01-07: 20 mg via INTRAVENOUS

## 2024-01-06 MED ORDER — FENTANYL CITRATE (PF) 100 MCG/2ML IJ SOLN
50.0000 ug | INTRAMUSCULAR | Status: DC | PRN
  Administered 2024-01-07: 100 ug via INTRAVENOUS

## 2024-01-06 MED ORDER — NIFEDIPINE ER OSMOTIC RELEASE 60 MG PO TB24
60.0000 mg | ORAL_TABLET | Freq: Every morning | ORAL | Status: DC
  Administered 2024-01-07: 60 mg via ORAL

## 2024-01-06 MED ORDER — MISOPROSTOL 25 MCG QUARTER TABLET: 25.0000 ug | ORAL_TABLET | ORAL | Status: DC | PRN

## 2024-01-06 MED ORDER — EPHEDRINE 5 MG/ML INJ
10.0000 mg | INTRAVENOUS | Status: AC | PRN
  Administered 2024-01-07 (×2): 10 mg via INTRAVENOUS

## 2024-01-06 MED ORDER — TERBUTALINE SULFATE 1 MG/ML IJ SOLN: 0.2500 mg | Freq: Once | INTRAMUSCULAR | Status: DC | PRN

## 2024-01-06 MED ORDER — INSULIN ASPART 100 UNIT/ML IJ SOLN: 0.0000 [IU] | INTRAMUSCULAR | Status: DC

## 2024-01-06 MED ORDER — OXCARBAZEPINE 300 MG PO TABS
900.0000 mg | ORAL_TABLET | Freq: Two times a day (BID) | ORAL | Status: DC
  Administered 2024-01-06 – 2024-01-10 (×8): 900 mg via ORAL
  Filled 2024-01-06: qty 3

## 2024-01-06 MED ORDER — DIPHENHYDRAMINE HCL 50 MG/ML IJ SOLN: 12.5000 mg | INTRAMUSCULAR | Status: DC | PRN

## 2024-01-06 MED ORDER — ACETAMINOPHEN 325 MG PO TABS: 650.0000 mg | ORAL_TABLET | ORAL | Status: DC | PRN

## 2024-01-06 MED ORDER — LACTATED RINGERS IV SOLN
500.0000 mL | Freq: Once | INTRAVENOUS | Status: AC
  Administered 2024-01-06: 500 mL via INTRAVENOUS

## 2024-01-06 NOTE — Anesthesia Procedure Notes (Signed)
 Epidural Patient location during procedure: OB Start time: 01/06/2024 11:49 PM End time: 01/07/2024 12:03 AM  Staffing Anesthesiologist: Jefm Garnette LABOR, MD Performed: anesthesiologist   Preanesthetic Checklist Completed: patient identified, IV checked, site marked, risks and benefits discussed, surgical consent, monitors and equipment checked, pre-op evaluation and timeout performed  Epidural Patient position: sitting Prep: DuraPrep and site prepped and draped Patient monitoring: continuous pulse ox and blood pressure Approach: midline Location: L3-L4 Injection technique: LOR air  Needle:  Needle type: Tuohy  Needle gauge: 17 G Needle length: 9 cm and 9 Needle insertion depth: 7 cm Catheter type: closed end flexible Catheter size: 19 Gauge Catheter at skin depth: 13 cm Test dose: negative  Assessment Events: blood not aspirated, no cerebrospinal fluid, injection not painful, no injection resistance, no paresthesia and negative IV test  Additional Notes Patient identified. Risks/Benefits/Options discussed with patient including but not limited to bleeding, infection, nerve damage, paralysis, failed block, incomplete pain control, headache, blood pressure changes, nausea, vomiting, reactions to medication both or allergic, itching and postpartum back pain. Confirmed with bedside nurse the patient's most recent platelet count. Confirmed with patient that they are not currently taking any anticoagulation, have any bleeding history or any family history of bleeding disorders. Patient expressed understanding and wished to proceed. All questions were answered. Sterile technique was used throughout the entire procedure. Please see nursing notes for vital signs. Test dose was given through epidural needle and negative prior to continuing to dose epidural or start infusion. Warning signs of high block given to the patient including shortness of breath, tingling/numbness in hands, complete  motor block, or any concerning symptoms with instructions to call for help. Patient was given instructions on fall risk and not to get out of bed. All questions and concerns addressed with instructions to call with any issues.  1Attempt (S) . Patient tolerated procedure well.

## 2024-01-06 NOTE — MAU Note (Signed)
 Selena Meyer is a 38 y.o. at [redacted]w[redacted]d here in MAU reporting: vomited earlier today x2. States after vomiting she felt vaginal pain. When she would start to stand, sit down or walking there was pain. Then felt pain in her lower back. Pt in wheelchair and stated she was unable to stand up because her legs feel weak. States she is having pelvic pressure that is worse with fetal movement. Denies any CTXs, LOF or VB. Reports +FM  Onset of complaint: today Pain score: 6 Vitals:   01/06/24 1821  BP: (!) 151/90  Pulse: 89  Resp: 18  Temp: 98.1 F (36.7 C)  SpO2: 97%     FHT:145 Lab orders placed from triage:

## 2024-01-06 NOTE — MAU Provider Note (Signed)
 Chief Complaint:  Vaginal Pain   HPI   None     Selena Meyer is a 38 y.o. G2P0101 at [redacted]w[redacted]d who presents to maternity admissions reporting pelvic pressure since noon. Had an episode of vomiting this morning and afterwards has having increased pelvic pressure every 30-45 minutes. Has been increasing throughout the day to every 3-5 minutes. Notes episodes of vomiting throughout the day as the pelvic pressure has been getting more painful and intense. Denies vaginal bleeding, decreased fetal movement or leakage of fluid. Did not take her afternoon labetalol  dose today due to vomiting.  Pregnancy Course: Follows with [facility] for prenatal care. 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    Gestational diabetes    Hypertension    Hypokalemia 11/02/2020   Irregular menstrual cycle    Irregular periods/menstrual cycles 05/03/2017   Polycystic ovaries 11/09/2015   Proteinuria 02/28/2013   Seizures (HCC)    12.24 takes meds   Stage 2 chronic kidney disease 04/27/2020   SVT (supraventricular tachycardia) 11/01/2020   Systolic murmur 07/16/2017   Tobacco abuse 02/28/2013   Uncontrolled hypertension 05/10/2017   OB History  Gravida Para Term Preterm AB Living  2 1  1  1   SAB IAB Ectopic Multiple Live Births     0 1    # Outcome Date GA Lbr Len/2nd Weight Sex Type Anes PTL Lv  2 Current           1 Preterm 09/29/22 [redacted]w[redacted]d 09:17 / 00:58 2080 g F Vag-Spont EPI  LIV   Past Surgical History:  Procedure Laterality Date   NO PAST  SURGERIES     Family History  Problem Relation Age of Onset   Diabetes Mother    High blood pressure Mother    Renal Disease Mother    Diabetes Sister    Cancer Maternal Grandmother    Diabetes Maternal Grandmother    High blood pressure Maternal Grandmother    Cancer Maternal Aunt        breast   Other Father    Social History[1] Allergies[2] Medications Prior to Admission  Medication Sig Dispense Refill Last Dose/Taking   Accu-Chek Softclix Lancets lancets 1 each by Other route 4 (four) times daily. 200 each 3 01/06/2024   acetaminophen  (TYLENOL ) 500 MG tablet Take 500 mg by mouth as needed.   Past Week   aspirin  EC 81 MG tablet Take 2 tablets (162 mg total) by mouth daily. Swallow whole. 60 tablet 11 01/06/2024   Blood Glucose Monitoring Suppl (ACCU-CHEK GUIDE) w/Device KIT 1 Device by Does not apply route 4 (four) times daily. 1 kit 0 01/06/2024   ferrous sulfate  325 (65 FE) MG tablet Take 1 tablet (325 mg total) by mouth every other day. 100 tablet 3 01/06/2024   folic acid  (FOLVITE ) 1 MG tablet Take 3 tablets (3 mg total) by mouth daily. 270 tablet 3 01/06/2024   hydrALAZINE  (APRESOLINE ) 10 MG tablet Take 1 tablet (10 mg total) by mouth 2 (two) times daily. 60 tablet 5 01/06/2024   insulin  aspart (NOVOLOG  FLEXPEN) 100 UNIT/ML FlexPen Inject 6  Units into the skin with breakfast, with lunch, and with evening meal. With lunch & dinner 9 mL 3 01/06/2024   insulin  glargine (LANTUS ) 100 UNIT/ML Solostar Pen Inject 12 Units into the skin daily. (Patient taking differently: Inject 10 Units into the skin daily. Breakfast) 15 mL 2 01/06/2024   labetalol  (NORMODYNE ) 200 MG tablet Take 2 tablets (400 mg total) by mouth 3 (three) times daily. (Patient taking differently: Take 500 mg by mouth 3 (three) times daily.) 540 tablet 3 01/06/2024   NIFEdipine  (ADALAT  CC) 60 MG 24 hr tablet Take 60mg  in the morning and 90 mg in the evening (1 and 1/2 tablets)   01/06/2024   oxcarbazepine  (TRILEPTAL )  600 MG tablet Take 1.5 tablets (900 mg total) by mouth 2 (two) times daily. 90 tablet 2 01/06/2024   Prenatal Vit-Fe Fumarate-FA (MULTIVITAMIN-PRENATAL) 27-0.8 MG TABS tablet Take 1 tablet by mouth daily at 12 noon.   01/05/2024   albuterol  (VENTOLIN  HFA) 108 (90 Base) MCG/ACT inhaler Inhale 2 puffs into the lungs every 6 (six) hours as needed for wheezing or shortness of breath. 1 each 1 More than a month   glucose blood test strip Check blood sugars 4x daily 200 each 3    Insulin  Pen Needle (PEN NEEDLES) 32G X 4 MM MISC 1 Units by Does not apply route in the morning, at noon, in the evening, and at bedtime. 50 each 1    metoCLOPramide  (REGLAN ) 10 MG tablet Take 1 tablet (10 mg total) by mouth 4 (four) times daily as needed for nausea or vomiting. 30 tablet 2 Unknown   Midazolam  (NAYZILAM ) 5 MG/0.1ML SOLN Place 5 mg into the nose as needed (For seizure cluster or seizure lasting more than 5 minutes). 2 each 0 More than a month   ondansetron  (ZOFRAN -ODT) 8 MG disintegrating tablet Take 0.5 tablets (4 mg total) by mouth every 8 (eight) hours as needed for nausea. 30 tablet 2 Unknown    I have reviewed patient's Past Medical Hx, Surgical Hx, Family Hx, Social Hx, medications and allergies.   ROS  Pertinent items noted in HPI and remainder of comprehensive ROS otherwise negative.   PHYSICAL EXAM  Patient Vitals for the past 24 hrs:  BP Temp Temp src Pulse Resp SpO2 Height Weight  01/06/24 2015 (!) 155/93 -- -- 88 -- 94 % -- --  01/06/24 2000 (!) 155/99 -- -- 90 -- 93 % -- --  01/06/24 1945 (!) 164/100 -- -- 81 -- 95 % -- --  01/06/24 1935 -- -- -- -- -- 93 % -- --  01/06/24 1930 (!) 167/95 -- -- 79 -- 94 % -- --  01/06/24 1925 -- -- -- -- -- 95 % -- --  01/06/24 1920 (!) 154/99 -- -- 83 -- 94 % -- --  01/06/24 1917 (!) 154/99 -- -- 87 -- -- -- --  01/06/24 1901 (!) 157/97 -- -- 75 -- -- -- --  01/06/24 1900 -- -- -- -- -- 95 % -- --  01/06/24 1855 -- -- -- -- -- 95 % -- --  01/06/24 1850 --  -- -- -- -- 94 % -- --  01/06/24 1846 (!) 147/95 -- -- 95 -- -- -- --  01/06/24 1845 -- -- -- -- -- 95 % -- --  01/06/24 1840 (!) 153/95 -- -- 86 -- -- -- --  01/06/24 1821 (!) 151/90 98.1 F (36.7 C) Oral 89 18 97 % 5' 4 (1.626 m) 89.4 kg    Constitutional:  Well-developed, well-nourished female in no acute distress.  Cardiovascular: Warm and well-perfused Respiratory: normal effort, no problems with respiration noted GI: Abd soft, non-tender, gravid MS: Extremities nontender, no edema, normal ROM Neurologic: Alert and oriented x 4.  Pelvic: deferred  Dilation: 4 Effacement (%): 70 Station: -3 Presentation: Vertex Exam by:: Derrek Freund, RN  Fetal Tracing: Baseline: 135bpm Variability: Moderate Accelerations: Present Decelerations: Absent Toco: Ctx q2-48min   Labs: Results for orders placed or performed during the hospital encounter of 01/06/24 (from the past 24 hours)  CBC     Status: Abnormal   Collection Time: 01/06/24  7:16 PM  Result Value Ref Range   WBC 11.4 (H) 4.0 - 10.5 K/uL   RBC 3.83 (L) 3.87 - 5.11 MIL/uL   Hemoglobin 11.7 (L) 12.0 - 15.0 g/dL   HCT 65.8 (L) 63.9 - 53.9 %   MCV 89.0 80.0 - 100.0 fL   MCH 30.5 26.0 - 34.0 pg   MCHC 34.3 30.0 - 36.0 g/dL   RDW 87.0 88.4 - 84.4 %   Platelets 311 150 - 400 K/uL   nRBC 0.0 0.0 - 0.2 %  Comprehensive metabolic panel with GFR     Status: Abnormal   Collection Time: 01/06/24  7:16 PM  Result Value Ref Range   Sodium 137 135 - 145 mmol/L   Potassium 4.4 3.5 - 5.1 mmol/L   Chloride 104 98 - 111 mmol/L   CO2 21 (L) 22 - 32 mmol/L   Glucose, Bld 71 70 - 99 mg/dL   BUN 11 6 - 20 mg/dL   Creatinine, Ser 8.95 (H) 0.44 - 1.00 mg/dL   Calcium  8.7 (L) 8.9 - 10.3 mg/dL   Total Protein 6.3 (L) 6.5 - 8.1 g/dL   Albumin 3.2 (L) 3.5 - 5.0 g/dL   AST 27 15 - 41 U/L   ALT 14 0 - 44 U/L   Alkaline Phosphatase 117 38 - 126 U/L   Total Bilirubin <0.2 0.0 - 1.2 mg/dL   GFR, Estimated >39 >39 mL/min   Anion gap 12 5 - 15     Imaging:  No results found.  MDM & MAU COURSE  MDM: High  MAU Course: Orders Placed This Encounter  Procedures   CBC   Comprehensive metabolic panel with GFR   Protein / creatinine ratio, urine   Meds ordered this encounter  Medications   labetalol  (NORMODYNE ) tablet 500 mg   Elevated blood pressures on arrival.  PEC labs collected. SVE 4/70/-3. Severe pressures noted while in the MAU. Discussed case with Dr. Ozan, plan for admission.  ASSESSMENT  cHTN with superimposed pre-eclampsia w/ severe features  PLAN  Admit for labor     Charlie Courts, MD  Family Medicine - Obstetrics Fellow        [1]  Social History Tobacco Use   Smoking status: Former    Current packs/day: 0.00    Average packs/day: 0.3 packs/day for 5.0 years (1.3 ttl pk-yrs)    Types: Cigarettes    Start date: 05/16/2016    Quit date: 05/16/2021    Years since quitting: 2.6   Smokeless tobacco: Never   Tobacco comments:    Quit smoking 05/29/2023after her mom passed away  Vaping Use   Vaping status: Never Used  Substance Use Topics   Alcohol use: Not Currently    Comment: occasional use; social drinker   Drug use: No  [2]  Allergies Allergen Reactions   Ibuprofen Hives and Rash

## 2024-01-06 NOTE — H&P (Signed)
 OBSTETRIC ADMISSION HISTORY AND PHYSICAL  Selena Meyer is a 38 y.o. female G43P0101 with IUP at [redacted]w[redacted]d (dated by LMP, Estimated Date of Delivery: 02/01/24) presenting for SOL with newly diagnosed SIPE w/ SF (BP's).   She reports +FMs, No LOF, no VB, no blurry vision, headaches or peripheral edema, and RUQ pain.    She plans on breast feeding. She request BTL for birth control.  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    Gestational diabetes    Hypertension    Hypokalemia 11/02/2020   Irregular menstrual cycle    Irregular periods/menstrual cycles 05/03/2017   Polycystic ovaries 11/09/2015   Proteinuria 02/28/2013   Seizures (HCC)    12.24 takes meds   Stage 2 chronic kidney disease 04/27/2020   SVT (supraventricular tachycardia) 11/01/2020   Systolic murmur 07/16/2017   Tobacco abuse 02/28/2013   Uncontrolled hypertension 05/10/2017    Past Surgical History: Past Surgical History:  Procedure Laterality Date   NO PAST SURGERIES      Obstetrical History: OB History     Gravida  2   Para  1   Term      Preterm  1   AB      Living  1      SAB      IAB      Ectopic      Multiple  0   Live Births  1           Social History Social History   Socioeconomic History   Marital status: Married    Spouse name: Marsha   Number of children: 0   Years of education: Not on file   Highest education level: Not on file  Occupational History    Employer: UNEMPLOYED  Tobacco Use   Smoking status: Former    Current packs/day: 0.00     Average packs/day: 0.3 packs/day for 5.0 years (1.3 ttl pk-yrs)    Types: Cigarettes    Start date: 05/16/2016    Quit date: 05/16/2021    Years since quitting: 2.6   Smokeless tobacco: Never   Tobacco comments:    Quit smoking May 30, 2023after her mom passed away  Vaping Use   Vaping status: Never Used  Substance and Sexual Activity   Alcohol use: Not Currently    Comment: occasional use; social drinker   Drug use: No   Sexual activity: Yes    Partners: Male    Birth control/protection: None    Comment: Pregnant  Other Topics Concern   Not on file  Social History Narrative   Not on file   Social Drivers of Health   Tobacco Use: Medium Risk (01/06/2024)   Patient History    Smoking Tobacco Use: Former    Smokeless Tobacco Use: Never    Passive Exposure: Not on file  Financial Resource Strain: Low Risk (05/08/2022)   Received from [facility]   Overall Financial Resource Strain (CARDIA)    Difficulty of Paying Living Expenses: Not very hard  Food Insecurity: No Food Insecurity (01/06/2024)   Epic    Worried  About Running Out of Food in the Last Year: Never true    Ran Out of Food in the Last Year: Never true  Transportation Needs: No Transportation Needs (01/06/2024)   Epic    Lack of Transportation (Medical): No    Lack of Transportation (Non-Medical): No  Physical Activity: Not on file  Stress: Not on file  Social Connections: Not on file  Depression (PHQ2-9): Low Risk (11/14/2023)   Depression (PHQ2-9)    PHQ-2 Score: 4  Alcohol Screen: Not on file  Housing: Unknown (01/06/2024)   Epic    Unable to Pay for Housing in the Last Year: No    Number of Times Moved in the Last Year: Not on file    Homeless in the Last Year: No  Utilities: Not At Risk (01/06/2024)   Epic    Threatened with loss of utilities: No  Health Literacy: Not on file    Family History: Family History  Problem Relation Age of Onset   Diabetes Mother    High blood pressure Mother    Renal  Disease Mother    Diabetes Sister    Cancer Maternal Grandmother    Diabetes Maternal Grandmother    High blood pressure Maternal Grandmother    Cancer Maternal Aunt        breast   Other Father     Allergies: Allergies[1]  Medications Prior to Admission  Medication Sig Dispense Refill Last Dose/Taking   Accu-Chek Softclix Lancets lancets 1 each by Other route 4 (four) times daily. 200 each 3 01/06/2024   acetaminophen  (TYLENOL ) 500 MG tablet Take 500 mg by mouth as needed.   Past Week   aspirin  EC 81 MG tablet Take 2 tablets (162 mg total) by mouth daily. Swallow whole. 60 tablet 11 01/06/2024   Blood Glucose Monitoring Suppl (ACCU-CHEK GUIDE) w/Device KIT 1 Device by Does not apply route 4 (four) times daily. 1 kit 0 01/06/2024   ferrous sulfate  325 (65 FE) MG tablet Take 1 tablet (325 mg total) by mouth every other day. 100 tablet 3 01/06/2024   folic acid  (FOLVITE ) 1 MG tablet Take 3 tablets (3 mg total) by mouth daily. 270 tablet 3 01/06/2024   hydrALAZINE  (APRESOLINE ) 10 MG tablet Take 1 tablet (10 mg total) by mouth 2 (two) times daily. 60 tablet 5 01/06/2024   insulin  aspart (NOVOLOG  FLEXPEN) 100 UNIT/ML FlexPen Inject 6 Units into the skin with breakfast, with lunch, and with evening meal. With lunch & dinner 9 mL 3 01/06/2024   insulin  glargine (LANTUS ) 100 UNIT/ML Solostar Pen Inject 12 Units into the skin daily. (Patient taking differently: Inject 10 Units into the skin daily. Breakfast) 15 mL 2 01/06/2024   labetalol  (NORMODYNE ) 200 MG tablet Take 2 tablets (400 mg total) by mouth 3 (three) times daily. (Patient taking differently: Take 500 mg by mouth 3 (three) times daily.) 540 tablet 3 01/06/2024   NIFEdipine  (ADALAT  CC) 60 MG 24 hr tablet Take 60mg  in the morning and 90 mg in the evening (1 and 1/2 tablets)   01/06/2024   oxcarbazepine  (TRILEPTAL ) 600 MG tablet Take 1.5 tablets (900 mg total) by mouth 2 (two) times daily. 90 tablet 2 01/06/2024   Prenatal Vit-Fe  Fumarate-FA (MULTIVITAMIN-PRENATAL) 27-0.8 MG TABS tablet Take 1 tablet by mouth daily at 12 noon.   01/05/2024   albuterol  (VENTOLIN  HFA) 108 (90 Base) MCG/ACT inhaler Inhale 2 puffs into the lungs every 6 (six) hours as needed for wheezing or shortness of breath. 1  each 1 More than a month   glucose blood test strip Check blood sugars 4x daily 200 each 3    Insulin  Pen Needle (PEN NEEDLES) 32G X 4 MM MISC 1 Units by Does not apply route in the morning, at noon, in the evening, and at bedtime. 50 each 1    metoCLOPramide  (REGLAN ) 10 MG tablet Take 1 tablet (10 mg total) by mouth 4 (four) times daily as needed for nausea or vomiting. 30 tablet 2 Unknown   Midazolam  (NAYZILAM ) 5 MG/0.1ML SOLN Place 5 mg into the nose as needed (For seizure cluster or seizure lasting more than 5 minutes). 2 each 0 More than a month   ondansetron  (ZOFRAN -ODT) 8 MG disintegrating tablet Take 0.5 tablets (4 mg total) by mouth every 8 (eight) hours as needed for nausea. 30 tablet 2 Unknown     Review of Systems  All systems reviewed and negative except as stated in HPI.  Blood pressure (!) 151/94, pulse 80, temperature 98.2 F (36.8 C), temperature source Oral, resp. rate 16, height 5' 4 (1.626 m), weight 89.4 kg, last menstrual period 04/27/2023, SpO2 94%, unknown if currently breastfeeding. General appearance: alert, cooperative, and appears stated age Lungs: breathing comfortably on room air Heart: regular rate Abdomen: soft, non-tender; gravid Extremities: no edema of bilateral lower extremities DTR's intact Presentation: cephalic Fetal monitoringBaseline: 135 bpm, Variability: Good {> 6 bpm), Accelerations: Reactive, and Decelerations: Absent Uterine activityFrequency: Every 5-10 minutes  Dilation: 5 Effacement (%): 70, 80 Station: -3 Exam by:: Derrek Freund, RN   Prenatal labs: ABO, Rh: --/--/A POS (12/28 2040) Antibody: NEG (12/28 2040) Rubella: 1.51 (08/05 1419) RPR: Non Reactive (11/05 1649)   HBsAg: Negative (08/05 1419)  HIV: Non Reactive (11/05 1649)  GBS: Negative/-- (12/22 1454)  2 hr Glucola abnormal Genetic screening  LR female Anatomy US  Appears normal Last US : At [redacted]w[redacted]d - cephalic presentation, EFW 2784g (91% %tile), AC 97%  Prenatal Transfer Tool  Maternal Diabetes: Yes:  Diabetes Type:  Insulin /Medication controlled Genetic Screening: Normal Maternal Ultrasounds/Referrals: Normal Fetal Ultrasounds or other Referrals:  None Maternal Substance Abuse:  No Significant Maternal Medications:  Meds include: Other: Trileptal  Significant Maternal Lab Results:  Other: GBS unk, PCR pending Number of Prenatal Visits:greater than 3 verified prenatal visits Other Comments:  None  Results for orders placed or performed during the hospital encounter of 01/06/24 (from the past 24 hours)  Protein / creatinine ratio, urine   Collection Time: 01/06/24  6:49 PM  Result Value Ref Range   Creatinine, Urine 55 mg/dL   Total Protein, Urine >600 mg/dL   Protein Creatinine Ratio NOT CALCULATED <0.2 mg/mg  CBC   Collection Time: 01/06/24  7:16 PM  Result Value Ref Range   WBC 11.4 (H) 4.0 - 10.5 K/uL   RBC 3.83 (L) 3.87 - 5.11 MIL/uL   Hemoglobin 11.7 (L) 12.0 - 15.0 g/dL   HCT 65.8 (L) 63.9 - 53.9 %   MCV 89.0 80.0 - 100.0 fL   MCH 30.5 26.0 - 34.0 pg   MCHC 34.3 30.0 - 36.0 g/dL   RDW 87.0 88.4 - 84.4 %   Platelets 311 150 - 400 K/uL   nRBC 0.0 0.0 - 0.2 %  Comprehensive metabolic panel with GFR   Collection Time: 01/06/24  7:16 PM  Result Value Ref Range   Sodium 137 135 - 145 mmol/L   Potassium 4.4 3.5 - 5.1 mmol/L   Chloride 104 98 - 111 mmol/L   CO2 21 (L) 22 -  32 mmol/L   Glucose, Bld 71 70 - 99 mg/dL   BUN 11 6 - 20 mg/dL   Creatinine, Ser 8.95 (H) 0.44 - 1.00 mg/dL   Calcium  8.7 (L) 8.9 - 10.3 mg/dL   Total Protein 6.3 (L) 6.5 - 8.1 g/dL   Albumin 3.2 (L) 3.5 - 5.0 g/dL   AST 27 15 - 41 U/L   ALT 14 0 - 44 U/L   Alkaline Phosphatase 117 38 - 126 U/L   Total  Bilirubin <0.2 0.0 - 1.2 mg/dL   GFR, Estimated >39 >39 mL/min   Anion gap 12 5 - 15  Type and screen   Collection Time: 01/06/24  8:40 PM  Result Value Ref Range   ABO/RH(D) A POS    Antibody Screen NEG    Sample Expiration      01/09/2024,2359 Performed at [facility], [address], [place], [place] [address]   Glucose, capillary   Collection Time: 01/06/24 10:00 PM  Result Value Ref Range   Glucose-Capillary 72 70 - 99 mg/dL    Patient Active Problem List   Diagnosis Date Noted   Labor and delivery, indication for care 01/06/2024   AMA (advanced maternal age) multigravida 35+ 08/22/2023   Early GDM w/ polyhydramnios 08/21/2023   Hypertensive nephropathy 08/10/2023   Proteinuria 08/10/2023   Nonintractable epilepsy without status epilepticus (HCC) 07/28/2023   Supervision of high risk pregnancy, antepartum 07/25/2023   Moderate left ventricular hypertrophy 05/19/2022   Chronic hypertension 05/09/2022   SVT (supraventricular tachycardia) 11/01/2020   Maternal iron  deficiency anemia complicating pregnancy 04/27/2020   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 G2P0101 at [redacted]w[redacted]d here for SOL in a patient with cHTN now diagnosed with SIPE w/ SF (BP's).   #Labor: Augmentation of labor discussed in detail with the patient. Will plan on AROM after epidural.  #Pain: Planning epidural #FWB: Cat I #ID:  GBS negative #MOF: Breast #MOC: BTL (consent signed 10/27) #Circ:  Yes #cHTN w/ SIPE w/ SF: Been established with Dr. Sheena. Continue home labetalol  500mg  TID, hydralazine  10mg  BID and Nifedipine  60mg  XR in the AM and 90mg  XR in the PM. Continue to monitor BP's. Started on magnesium  gtt for eclampsia ppx. Has history of eclampsia in previous pregnancy, in 2024.  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 Continue Trileptal  900mg  BID.  #A2GDM: On Semglee  10u qAM & novolog  with meals. Sliding scale  while in labor. Will consider endotool if required. Q4h CBG checks in latent labor and q2h CBG checks in active labor.  #FSGS: F/w [facility] q6 mos Dr Manuelita Barters -. 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  Baseline proteinuria 1731; Cr 0.82-1.07 Last seen 9/22 with no changes to plan/medications. Next appt postpartum (04/08/24)   Barkley Angles, MD OB Fellow, Faculty Practice [facility], [facility] 01/06/2024 10:10 PM         [1]  Allergies Allergen Reactions   Ibuprofen Hives and Rash

## 2024-01-06 NOTE — Anesthesia Preprocedure Evaluation (Addendum)
 "                                  Anesthesia Evaluation  Patient identified by MRN, date of birth, ID band Patient awake    Reviewed: Allergy & Precautions, NPO status , Patient's Chart, lab work & pertinent test results  Airway Mallampati: II  TM Distance: >3 FB Neck ROM: Full    Dental no notable dental hx. (+) Teeth Intact, Dental Advisory Given   Pulmonary former smoker   Pulmonary exam normal breath sounds clear to auscultation       Cardiovascular hypertension (prE), Normal cardiovascular exam Rhythm:Regular Rate:Normal     Neuro/Psych  Headaches, Seizures -,   negative psych ROS   GI/Hepatic negative GI ROS, Neg liver ROS,,,  Endo/Other  diabetes, Gestational    Renal/GU Renal diseaseLab Results      Component                Value               Date                          CREATININE               1.04 (H)            01/06/2024                GFRNONAA                 >60                 01/06/2024                   Musculoskeletal negative musculoskeletal ROS (+)    Abdominal   Peds  Hematology Lab Results      Component                Value               Date                      WBC                      11.4 (H)            01/06/2024                HGB                      11.7 (L)            01/06/2024                HCT                      34.1 (L)            01/06/2024                MCV                      89.0                01/06/2024                PLT  311                 01/06/2024              Anesthesia Other Findings All: ibuprofen  Reproductive/Obstetrics (+) Pregnancy                              Anesthesia Physical Anesthesia Plan  ASA: 3  Anesthesia Plan: Epidural   Post-op Pain Management:    Induction:   PONV Risk Score and Plan:   Airway Management Planned:   Additional Equipment:   Intra-op Plan:   Post-operative Plan:   Informed Consent: I have  reviewed the patients History and Physical, chart, labs and discussed the procedure including the risks, benefits and alternatives for the proposed anesthesia with the patient or authorized representative who has indicated his/her understanding and acceptance.       Plan Discussed with:   Anesthesia Plan Comments: (36.2 wk  G2P1 w prE on Mg++  and glomerular sclerosis gDM for LEA)         Anesthesia Quick Evaluation  "

## 2024-01-07 ENCOUNTER — Encounter (HOSPITAL_COMMUNITY): Payer: Self-pay | Admitting: Obstetrics and Gynecology

## 2024-01-07 ENCOUNTER — Encounter: Payer: Self-pay | Admitting: Obstetrics and Gynecology

## 2024-01-07 ENCOUNTER — Encounter: Payer: Self-pay | Admitting: Family

## 2024-01-07 DIAGNOSIS — O24424 Gestational diabetes mellitus in childbirth, insulin controlled: Secondary | ICD-10-CM

## 2024-01-07 DIAGNOSIS — Z3A36 36 weeks gestation of pregnancy: Secondary | ICD-10-CM

## 2024-01-07 DIAGNOSIS — O09523 Supervision of elderly multigravida, third trimester: Secondary | ICD-10-CM

## 2024-01-07 DIAGNOSIS — O1414 Severe pre-eclampsia complicating childbirth: Secondary | ICD-10-CM

## 2024-01-07 DIAGNOSIS — O403XX0 Polyhydramnios, third trimester, not applicable or unspecified: Secondary | ICD-10-CM

## 2024-01-07 LAB — CBC
HCT: 34 % — ABNORMAL LOW (ref 36.0–46.0)
HCT: 31.3 % — ABNORMAL LOW (ref 36.0–46.0)
HCT: 28.2 % — ABNORMAL LOW (ref 36.0–46.0)
Hemoglobin: 11.5 g/dL — ABNORMAL LOW (ref 12.0–15.0)
Hemoglobin: 10.6 g/dL — ABNORMAL LOW (ref 12.0–15.0)
Hemoglobin: 9.6 g/dL — ABNORMAL LOW (ref 12.0–15.0)
MCH: 30 pg (ref 26.0–34.0)
MCH: 30.1 pg (ref 26.0–34.0)
MCH: 30.3 pg (ref 26.0–34.0)
MCHC: 33.8 g/dL (ref 30.0–36.0)
MCHC: 33.9 g/dL (ref 30.0–36.0)
MCHC: 34 g/dL (ref 30.0–36.0)
MCV: 88.8 fL (ref 80.0–100.0)
MCV: 88.9 fL (ref 80.0–100.0)
MCV: 89 fL (ref 80.0–100.0)
Platelets: 305 K/uL (ref 150–400)
Platelets: 287 K/uL (ref 150–400)
Platelets: 256 K/uL (ref 150–400)
RBC: 3.83 MIL/uL — ABNORMAL LOW (ref 3.87–5.11)
RBC: 3.52 MIL/uL — ABNORMAL LOW (ref 3.87–5.11)
RBC: 3.17 MIL/uL — ABNORMAL LOW (ref 3.87–5.11)
RDW: 12.9 % (ref 11.5–15.5)
RDW: 12.9 % (ref 11.5–15.5)
RDW: 12.7 % (ref 11.5–15.5)
WBC: 10.6 K/uL — ABNORMAL HIGH (ref 4.0–10.5)
WBC: 13.2 K/uL — ABNORMAL HIGH (ref 4.0–10.5)
WBC: 16.9 K/uL — ABNORMAL HIGH (ref 4.0–10.5)
nRBC: 0 % (ref 0.0–0.2)
nRBC: 0 % (ref 0.0–0.2)
nRBC: 0 % (ref 0.0–0.2)

## 2024-01-07 LAB — COMPREHENSIVE METABOLIC PANEL WITH GFR
ALT: 15 U/L (ref 0–44)
ALT: 11 U/L (ref 0–44)
ALT: 12 U/L (ref 0–44)
AST: 26 U/L (ref 15–41)
AST: 27 U/L (ref 15–41)
AST: 28 U/L (ref 15–41)
Albumin: 3.3 g/dL — ABNORMAL LOW (ref 3.5–5.0)
Albumin: 2.6 g/dL — ABNORMAL LOW (ref 3.5–5.0)
Albumin: 2.6 g/dL — ABNORMAL LOW (ref 3.5–5.0)
Alkaline Phosphatase: 114 U/L (ref 38–126)
Alkaline Phosphatase: 94 U/L (ref 38–126)
Alkaline Phosphatase: 96 U/L (ref 38–126)
Anion gap: 10 (ref 5–15)
Anion gap: 12 (ref 5–15)
Anion gap: 9 (ref 5–15)
BUN: 12 mg/dL (ref 6–20)
BUN: 12 mg/dL (ref 6–20)
BUN: 11 mg/dL (ref 6–20)
CO2: 23 mmol/L (ref 22–32)
CO2: 17 mmol/L — ABNORMAL LOW (ref 22–32)
CO2: 22 mmol/L (ref 22–32)
Calcium: 8.5 mg/dL — ABNORMAL LOW (ref 8.9–10.3)
Calcium: 7.4 mg/dL — ABNORMAL LOW (ref 8.9–10.3)
Calcium: 7.5 mg/dL — ABNORMAL LOW (ref 8.9–10.3)
Chloride: 101 mmol/L (ref 98–111)
Chloride: 103 mmol/L (ref 98–111)
Chloride: 101 mmol/L (ref 98–111)
Creatinine, Ser: 1.08 mg/dL — ABNORMAL HIGH (ref 0.44–1.00)
Creatinine, Ser: 1.19 mg/dL — ABNORMAL HIGH (ref 0.44–1.00)
Creatinine, Ser: 1.12 mg/dL — ABNORMAL HIGH (ref 0.44–1.00)
GFR, Estimated: >60 mL/min
GFR, Estimated: 59 mL/min — ABNORMAL LOW
GFR, Estimated: >60 mL/min
Glucose, Bld: 75 mg/dL (ref 70–99)
Glucose, Bld: 113 mg/dL — ABNORMAL HIGH (ref 70–99)
Glucose, Bld: 103 mg/dL — ABNORMAL HIGH (ref 70–99)
Potassium: 4.5 mmol/L (ref 3.5–5.1)
Potassium: 4.5 mmol/L (ref 3.5–5.1)
Potassium: 4.5 mmol/L (ref 3.5–5.1)
Sodium: 134 mmol/L — ABNORMAL LOW (ref 135–145)
Sodium: 132 mmol/L — ABNORMAL LOW (ref 135–145)
Sodium: 131 mmol/L — ABNORMAL LOW (ref 135–145)
Total Bilirubin: <0.2 mg/dL (ref 0.0–1.2)
Total Bilirubin: <0.2 mg/dL (ref 0.0–1.2)
Total Bilirubin: 0.2 mg/dL (ref 0.0–1.2)
Total Protein: 6.1 g/dL — ABNORMAL LOW (ref 6.5–8.1)
Total Protein: 4.9 g/dL — ABNORMAL LOW (ref 6.5–8.1)
Total Protein: 5 g/dL — ABNORMAL LOW (ref 6.5–8.1)

## 2024-01-07 LAB — GLUCOSE, CAPILLARY
Glucose-Capillary: 108 mg/dL — ABNORMAL HIGH (ref 70–99)
Glucose-Capillary: 90 mg/dL (ref 70–99)
Glucose-Capillary: 130 mg/dL — ABNORMAL HIGH (ref 70–99)
Glucose-Capillary: 141 mg/dL — ABNORMAL HIGH (ref 70–99)

## 2024-01-07 LAB — DIC (DISSEMINATED INTRAVASCULAR COAGULATION)PANEL
D-Dimer, Quant: 1.07 ug{FEU}/mL — ABNORMAL HIGH (ref 0.00–0.50)
Fibrinogen: 540 mg/dL — ABNORMAL HIGH (ref 210–475)
INR: 1.1 (ref 0.8–1.2)
Platelets: 296 K/uL (ref 150–400)
Prothrombin Time: 14.6 s (ref 11.4–15.2)
Smear Review: NONE SEEN
aPTT: 28 s (ref 24–36)

## 2024-01-07 LAB — SYPHILIS: RPR W/REFLEX TO RPR TITER AND TREPONEMAL ANTIBODIES, TRADITIONAL SCREENING AND DIAGNOSIS ALGORITHM: RPR Ser Ql: NONREACTIVE

## 2024-01-07 LAB — MAGNESIUM
Magnesium: 4.1 mg/dL — ABNORMAL HIGH (ref 1.7–2.4)
Magnesium: 5.3 mg/dL — ABNORMAL HIGH (ref 1.7–2.4)
Magnesium: 4.4 mg/dL — ABNORMAL HIGH (ref 1.7–2.4)

## 2024-01-07 LAB — PREPARE RBC (CROSSMATCH)

## 2024-01-07 LAB — TROPONIN T, HIGH SENSITIVITY
Troponin T High Sensitivity: 30 ng/L — ABNORMAL HIGH (ref 0–19)
Troponin T High Sensitivity: 30 ng/L — ABNORMAL HIGH (ref 0–19)

## 2024-01-07 LAB — PRO BRAIN NATRIURETIC PEPTIDE: Pro Brain Natriuretic Peptide: 135 pg/mL

## 2024-01-07 MED ORDER — LIDOCAINE-EPINEPHRINE (PF) 2 %-1:200000 IJ SOLN
INTRAMUSCULAR | Status: AC
  Filled 2024-01-07: qty 20

## 2024-01-07 MED ORDER — ONDANSETRON HCL 4 MG/2ML IJ SOLN
4.0000 mg | INTRAMUSCULAR | Status: DC | PRN
  Administered 2024-01-09: 4 mg via INTRAVENOUS

## 2024-01-07 MED ORDER — LIDOCAINE-EPINEPHRINE (PF) 2 %-1:200000 IJ SOLN
INTRAMUSCULAR | Status: DC | PRN
  Administered 2024-01-07: 5 mL via EPIDURAL

## 2024-01-07 MED ORDER — CEFAZOLIN SODIUM-DEXTROSE 2-4 GM/100ML-% IV SOLN
2.0000 g | Freq: Once | INTRAVENOUS | Status: AC | PRN
  Administered 2024-01-07: 2 g via INTRAVENOUS
  Filled 2024-01-07: qty 100

## 2024-01-07 MED ORDER — OXYTOCIN-SODIUM CHLORIDE 30-0.9 UT/500ML-% IV SOLN
1.0000 m[IU]/min | INTRAVENOUS | Status: DC
  Administered 2024-01-07: 2 m[IU]/min via INTRAVENOUS

## 2024-01-07 MED ORDER — COCONUT OIL OIL: 1.0000 | TOPICAL_OIL | Status: DC | PRN

## 2024-01-07 MED ORDER — PRENATAL MULTIVITAMIN CH
1.0000 | ORAL_TABLET | Freq: Every day | ORAL | Status: DC
  Administered 2024-01-08 – 2024-01-10 (×3): 1 via ORAL
  Filled 2024-01-07 (×3): qty 1

## 2024-01-07 MED ORDER — LABETALOL HCL 200 MG PO TABS
400.0000 mg | ORAL_TABLET | Freq: Three times a day (TID) | ORAL | Status: DC
  Administered 2024-01-07 – 2024-01-08 (×2): 400 mg via ORAL
  Filled 2024-01-07 (×2): qty 2

## 2024-01-07 MED ORDER — DIPHENHYDRAMINE HCL 25 MG PO CAPS: 25.0000 mg | ORAL_CAPSULE | Freq: Four times a day (QID) | ORAL | Status: DC | PRN

## 2024-01-07 MED ORDER — SENNOSIDES-DOCUSATE SODIUM 8.6-50 MG PO TABS
2.0000 | ORAL_TABLET | Freq: Every day | ORAL | Status: DC
  Administered 2024-01-08 – 2024-01-10 (×3): 2 via ORAL
  Filled 2024-01-07 (×3): qty 2

## 2024-01-07 MED ORDER — MEDROXYPROGESTERONE ACETATE 150 MG/ML IM SUSP: 150.0000 mg | INTRAMUSCULAR | Status: DC | PRN

## 2024-01-07 MED ORDER — TETANUS-DIPHTH-ACELL PERTUSSIS 5-2-15.5 LF-MCG/0.5 IM SUSP: 0.5000 mL | Freq: Once | INTRAMUSCULAR | Status: DC

## 2024-01-07 MED ORDER — POTASSIUM CHLORIDE CRYS ER 20 MEQ PO TBCR: 20.0000 meq | EXTENDED_RELEASE_TABLET | Freq: Every day | ORAL | Status: DC

## 2024-01-07 MED ORDER — SODIUM CHLORIDE 0.9% IV SOLUTION: Freq: Once | INTRAVENOUS | Status: DC

## 2024-01-07 MED ORDER — ZOLPIDEM TARTRATE 5 MG PO TABS: 5.0000 mg | ORAL_TABLET | Freq: Every evening | ORAL | Status: DC | PRN

## 2024-01-07 MED ORDER — SODIUM CHLORIDE 0.9 % IV SOLN: 3.0000 g | Freq: Three times a day (TID) | INTRAVENOUS | Status: DC

## 2024-01-07 MED ORDER — HYDROMORPHONE HCL 1 MG/ML IJ SOLN: 1.0000 mg | INTRAMUSCULAR | Status: DC | PRN

## 2024-01-07 MED ORDER — METHYLERGONOVINE MALEATE 0.2 MG PO TABS: 0.2000 mg | ORAL_TABLET | ORAL | Status: DC | PRN

## 2024-01-07 MED ORDER — SIMETHICONE 80 MG PO CHEW
80.0000 mg | CHEWABLE_TABLET | ORAL | Status: DC | PRN
  Administered 2024-01-08: 80 mg via ORAL
  Filled 2024-01-07: qty 1

## 2024-01-07 MED ORDER — LACTATED RINGERS IV SOLN: INTRAVENOUS | Status: AC

## 2024-01-07 MED ORDER — LABETALOL HCL 200 MG PO TABS: 400.0000 mg | ORAL_TABLET | Freq: Two times a day (BID) | ORAL | Status: DC

## 2024-01-07 MED ORDER — OXYCODONE HCL 5 MG PO TABS
5.0000 mg | ORAL_TABLET | ORAL | Status: DC | PRN
  Administered 2024-01-08 – 2024-01-10 (×3): 5 mg via ORAL
  Filled 2024-01-07 (×3): qty 1

## 2024-01-07 MED ORDER — MAGNESIUM SULFATE 40 GM/1000ML IV SOLN
1.0000 g/h | INTRAVENOUS | Status: AC
  Administered 2024-01-08: 1 g/h via INTRAVENOUS
  Filled 2024-01-07: qty 1000

## 2024-01-07 MED ORDER — BENZOCAINE-MENTHOL 20-0.5 % EX AERO
1.0000 | INHALATION_SPRAY | CUTANEOUS | Status: DC | PRN
  Filled 2024-01-07: qty 56

## 2024-01-07 MED ORDER — WITCH HAZEL-GLYCERIN EX PADS: 1.0000 | MEDICATED_PAD | CUTANEOUS | Status: DC | PRN

## 2024-01-07 MED ORDER — ACETAMINOPHEN 325 MG PO TABS
650.0000 mg | ORAL_TABLET | ORAL | Status: DC | PRN
  Administered 2024-01-07 – 2024-01-08 (×2): 650 mg via ORAL
  Filled 2024-01-07 (×2): qty 2

## 2024-01-07 MED ORDER — INSULIN ASPART 100 UNIT/ML IJ SOLN: 0.0000 [IU] | Freq: Every day | INTRAMUSCULAR | Status: DC

## 2024-01-07 MED ORDER — TRANEXAMIC ACID-NACL 1000-0.7 MG/100ML-% IV SOLN
1000.0000 mg | Freq: Once | INTRAVENOUS | Status: AC | PRN
  Administered 2024-01-07: 1000 mg via INTRAVENOUS
  Filled 2024-01-07: qty 100

## 2024-01-07 MED ORDER — INSULIN ASPART 100 UNIT/ML IJ SOLN: 0.0000 [IU] | Freq: Three times a day (TID) | INTRAMUSCULAR | Status: DC

## 2024-01-07 MED ORDER — FAMOTIDINE IN NACL 20-0.9 MG/50ML-% IV SOLN
20.0000 mg | Freq: Once | INTRAVENOUS | Status: AC
  Administered 2024-01-07: 20 mg via INTRAVENOUS
  Filled 2024-01-07: qty 50

## 2024-01-07 MED ORDER — TRANEXAMIC ACID-NACL 1000-0.7 MG/100ML-% IV SOLN
1000.0000 mg | INTRAVENOUS | Status: DC
  Filled 2024-01-07: qty 100

## 2024-01-07 MED ORDER — FUROSEMIDE 20 MG PO TABS
20.0000 mg | ORAL_TABLET | Freq: Every day | ORAL | Status: DC
  Administered 2024-01-08 – 2024-01-10 (×3): 20 mg via ORAL
  Filled 2024-01-07 (×3): qty 1

## 2024-01-07 MED ORDER — MEASLES, MUMPS & RUBELLA VAC ~~LOC~~ SUSR: 0.5000 mL | Freq: Once | SUBCUTANEOUS | Status: DC

## 2024-01-07 MED ORDER — DIBUCAINE (PERIANAL) 1 % EX OINT: 1.0000 | TOPICAL_OINTMENT | CUTANEOUS | Status: DC | PRN

## 2024-01-07 MED ORDER — MISOPROSTOL 200 MCG PO TABS
ORAL_TABLET | ORAL | Status: AC
  Administered 2024-01-07: 800 ug via BUCCAL
  Filled 2024-01-07: qty 4

## 2024-01-07 MED ORDER — NIFEDIPINE ER OSMOTIC RELEASE 60 MG PO TB24
60.0000 mg | ORAL_TABLET | Freq: Two times a day (BID) | ORAL | Status: DC
  Administered 2024-01-07 – 2024-01-10 (×6): 60 mg via ORAL
  Filled 2024-01-07 (×7): qty 1

## 2024-01-07 MED ORDER — ONDANSETRON HCL 4 MG PO TABS: 4.0000 mg | ORAL_TABLET | ORAL | Status: DC | PRN

## 2024-01-07 MED ORDER — TERBUTALINE SULFATE 1 MG/ML IJ SOLN: 0.2500 mg | Freq: Once | INTRAMUSCULAR | Status: DC | PRN

## 2024-01-07 MED ORDER — NIFEDIPINE ER OSMOTIC RELEASE 60 MG PO TB24: 60.0000 mg | ORAL_TABLET | Freq: Two times a day (BID) | ORAL | Status: DC

## 2024-01-07 MED ORDER — LIDOCAINE HCL (PF) 1 % IJ SOLN
INTRAMUSCULAR | Status: DC | PRN
  Administered 2024-01-06: 5 mL via EPIDURAL

## 2024-01-07 MED ORDER — TRANEXAMIC ACID-NACL 1000-0.7 MG/100ML-% IV SOLN
1000.0000 mg | Freq: Once | INTRAVENOUS | Status: AC | PRN
  Administered 2024-01-07: 1000 mg via INTRAVENOUS

## 2024-01-07 MED ORDER — METHYLERGONOVINE MALEATE 0.2 MG/ML IJ SOLN: 0.2000 mg | INTRAMUSCULAR | Status: DC | PRN

## 2024-01-07 MED ORDER — OXYCODONE HCL 5 MG PO TABS
10.0000 mg | ORAL_TABLET | ORAL | Status: DC | PRN
  Administered 2024-01-10: 10 mg via ORAL
  Filled 2024-01-07: qty 2

## 2024-01-07 NOTE — Anesthesia Postprocedure Evaluation (Signed)
"   Anesthesia Post Note  Patient: Selena Meyer  Procedure(s) Performed: AN AD HOC LABOR EPIDURAL     Patient location during evaluation: Mother Baby Anesthesia Type: Epidural Level of consciousness: awake and alert Pain management: pain level controlled Vital Signs Assessment: post-procedure vital signs reviewed and stable Respiratory status: spontaneous breathing, nonlabored ventilation and respiratory function stable Cardiovascular status: stable Postop Assessment: no headache, no backache and epidural receding Anesthetic complications: no   No notable events documented.  Last Vitals:  Vitals:   01/07/24 1555 01/07/24 1650  BP: (!) 144/87   Pulse: 75   Resp: 17 17  Temp: 37.1 C   SpO2: 97%     Last Pain:  Vitals:   01/07/24 1555  TempSrc: Oral  PainSc: 0-No pain   Pain Goal: Patients Stated Pain Goal: 3 (01/07/24 1449)                 [name]      "

## 2024-01-07 NOTE — Plan of Care (Signed)
 Problem: Education: Goal: Knowledge of General Education information will improve Description: Including pain rating scale, medication(s)/side effects and non-pharmacologic comfort measures Outcome: Progressing   Problem: Health Behavior/Discharge Planning: Goal: Ability to manage health-related needs will improve Outcome: Progressing   Problem: Clinical Measurements: Goal: Ability to maintain clinical measurements within normal limits will improve Outcome: Progressing Goal: Will remain free from infection Outcome: Progressing Goal: Diagnostic test results will improve Outcome: Progressing Goal: Respiratory complications will improve Outcome: Progressing Goal: Cardiovascular complication will be avoided Outcome: Progressing   Problem: Activity: Goal: Risk for activity intolerance will decrease Outcome: Progressing   Problem: Nutrition: Goal: Adequate nutrition will be maintained Outcome: Progressing   Problem: Coping: Goal: Level of anxiety will decrease Outcome: Progressing   Problem: Elimination: Goal: Will not experience complications related to bowel motility Outcome: Progressing Goal: Will not experience complications related to urinary retention Outcome: Progressing   Problem: Pain Managment: Goal: General experience of comfort will improve and/or be controlled Outcome: Progressing   Problem: Safety: Goal: Ability to remain free from injury will improve Outcome: Progressing   Problem: Skin Integrity: Goal: Risk for impaired skin integrity will decrease Outcome: Progressing   Problem: Education: Goal: Ability to describe self-care measures that may prevent or decrease complications (Diabetes Survival Skills Education) will improve Outcome: Progressing Goal: Individualized Educational Video(s) Outcome: Progressing   Problem: Coping: Goal: Ability to adjust to condition or change in health will improve Outcome: Progressing   Problem: Fluid  Volume: Goal: Ability to maintain a balanced intake and output will improve Outcome: Progressing   Problem: Health Behavior/Discharge Planning: Goal: Ability to identify and utilize available resources and services will improve Outcome: Progressing Goal: Ability to manage health-related needs will improve Outcome: Progressing   Problem: Metabolic: Goal: Ability to maintain appropriate glucose levels will improve Outcome: Progressing   Problem: Nutritional: Goal: Maintenance of adequate nutrition will improve Outcome: Progressing Goal: Progress toward achieving an optimal weight will improve Outcome: Progressing   Problem: Skin Integrity: Goal: Risk for impaired skin integrity will decrease Outcome: Progressing   Problem: Tissue Perfusion: Goal: Adequacy of tissue perfusion will improve Outcome: Progressing   Problem: Education: Goal: Knowledge of disease or condition will improve Outcome: Progressing Goal: Knowledge of the prescribed therapeutic regimen will improve Outcome: Progressing   Problem: Fluid Volume: Goal: Peripheral tissue perfusion will improve Outcome: Progressing   Problem: Clinical Measurements: Goal: Complications related to disease process, condition or treatment will be avoided or minimized Outcome: Progressing   Problem: Education: Goal: Knowledge of Childbirth will improve Outcome: Progressing Goal: Ability to make informed decisions regarding treatment and plan of care will improve Outcome: Progressing Goal: Ability to state and carry out methods to decrease the pain will improve Outcome: Progressing Goal: Individualized Educational Video(s) Outcome: Progressing   Problem: Coping: Goal: Ability to verbalize concerns and feelings about labor and delivery will improve Outcome: Progressing   Problem: Life Cycle: Goal: Ability to make normal progression through stages of labor will improve Outcome: Progressing Goal: Ability to effectively  push during vaginal delivery will improve Outcome: Progressing   Problem: Role Relationship: Goal: Will demonstrate positive interactions with the child Outcome: Progressing   Problem: Safety: Goal: Risk of complications during labor and delivery will decrease Outcome: Progressing   Problem: Pain Management: Goal: Relief or control of pain from uterine contractions will improve Outcome: Progressing   Problem: Education: Goal: Knowledge of condition will improve Outcome: Progressing Goal: Individualized Educational Video(s) Outcome: Progressing Goal: Individualized Newborn Educational Video(s) Outcome: Progressing   Problem:  Activity: Goal: Will verbalize the importance of balancing activity with adequate rest periods Outcome: Progressing Goal: Ability to tolerate increased activity will improve Outcome: Progressing   Problem: Coping: Goal: Ability to identify and utilize available resources and services will improve Outcome: Progressing

## 2024-01-07 NOTE — Progress Notes (Signed)
 Labor Progress Note Selena Meyer is a 38 y.o. G2P0101 at [redacted]w[redacted]d presented for IOL due to cHTN w/ SIPE w/ SF.  S: Feeling some chest pain at the center of her chest. Does not radiate. Did have some nausea earlier, unable to describe the pain. Denies shortness of breath. Does look fatigued and lethargic but did have hypotension requiring phenylephrine  and ephedrine  after epidural.   O:  BP 111/69   Pulse 75   Temp 98.2 F (36.8 C) (Oral)   Resp 15   Ht 5' 4 (1.626 m)   Wt 89.4 kg   LMP 04/27/2023 (Exact Date)   SpO2 97%   BMI 33.81 kg/m  EFM: 130/Moderate Variability/Accelerations (+),Decelerations (-)  CVE: Dilation: 5 Effacement (%): 70, 80 Station: -3 Presentation: Vertex Exam by:: Derrek Freund, RN   A&P: 38 y.o. [id number] [redacted]w[redacted]d  #Labor: No cervical change since last exam but has not been on augmentation. Mag was started and patient elected to get epidural before AROM. AROM performed, clear fluid. Started on pitocin  2x2.  #Pain: Epidural in place.  #FWB: Category I #GBS negative #Chest pain: Due to PMHx and new SIPE ordered ECG, troponin and BNP. Also ordered magnesium  level. Pain more consistent with GERD. Ordered Pepcid  for symptom relief.  #cHTN w/ SIPE: BP's have been much better since epidural, will continue to monitor. Will get labs q6h.  Loraina Stauffer LITTIE Angles, MD 1:33 AM

## 2024-01-07 NOTE — Progress Notes (Signed)
 Spoke with [place]  Claudene, CNM who is consulting with Dr. Barbra. Mag restarted at 1g/hr. Hold IV labetalol  for now and wait 30 min. post restarting magnesium , at 13:15 if blood pressure is still greater than or equal to 160/110- will restart labetalol  protocol.   Adelita CHRISTELLA Rink, RN

## 2024-01-07 NOTE — Discharge Summary (Signed)
 "    Postpartum Discharge Summary  Date of Service updated: 01/10/24     Patient Name: Selena Meyer DOB: 06-19-1985 MRN: [id number]  Date of admission: 01/06/2024 Delivery date:01/07/2024 Delivering provider: CLAUDENE, Alaynah Schutter  Date of discharge: 01/10/2024  Admitting diagnosis: Labor and delivery, indication for care [O75.9] Intrauterine pregnancy: [redacted]w[redacted]d     Secondary diagnosis:  Principal Problem:   Labor and delivery, indication for care Active Problems:   FSGS (focal segmental glomerulosclerosis)   Maternal iron  deficiency anemia complicating pregnancy   SVT (supraventricular tachycardia)   Chronic hypertension   Moderate left ventricular hypertrophy   Nonintractable epilepsy without status epilepticus (HCC)   Early GDM w/ polyhydramnios   Postpartum hemorrhage  Additional problems: None    Discharge diagnosis: Preterm Pregnancy Delivered, CHTN with superimposed preeclampsia, Anemia, and PPH                                              Post partum procedures:postpartum tubal ligation Augmentation: AROM and Pitocin  Complications: Hemorrhage>1015mL  Hospital course: Induction of Labor With Vaginal Delivery   38 y.o. yo G2P0202 at [redacted]w[redacted]d was admitted to the hospital 01/06/2024 for induction of labor.  Indication for induction: Preeclampsia.  Patient had an labor course complicated by hypotension & chest pain related to epidural anesthesia.  Membrane Rupture Time/Date: 1:50 AM,01/07/2024  Delivery Method:Vaginal, Spontaneous Operative Delivery:N/A Episiotomy: None Lacerations:  2nd degree Details of delivery can be found in separate delivery note.  Patient had a postpartum course complicated by: 1. SIPE w/ SF (BP); hx FSGS/CKD - received mag postpartum. BP ultimately stabilized on procardia  XL 60mg  BID & labetalol  800mg  TID. Cr returned nearly to baseline on day of discharge. She will be discharged with lasix . Has follow up planned with Dr. Sheena on 1/5.  2. PPH, clinically  significant acute blood loss anemia due to unexpected blood loss - Hgb 8.6 on day of discharge but pt asymptomatic. PO iron  ordered.   Patient is discharged home in good condition 01/10/2024.  Newborn Data: Birth date:01/07/2024 Birth time:9:20 AM Gender:Female Living status:Living Apgars:8 ,9  Weight:2900 g  Magnesium  Sulfate received: Yes: Seizure prophylaxis BMZ received: No Rhophylac:N/A MMR:N/A T-DaP:Given prenatally Flu: No RSV Vaccine received: No Transfusion:No  Immunizations received: Immunization History  Administered Date(s) Administered   PPD Test 07/16/2017   Tdap 01/10/2015, 08/17/2022, 11/28/2023    Physical exam  Vitals:   01/10/24 1227 01/10/24 1313 01/10/24 1455 01/10/24 1658  BP: (!) 164/83 (!) 151/87 (!) 150/75 (!) 145/70  Pulse: 77 80 83 87  Resp:    16  Temp: 98.6 F (37 C)   98.4 F (36.9 C)  TempSrc: Oral   Oral  SpO2: 96%   97%  Weight:      Height:       General: alert, cooperative, and no distress Lochia: appropriate Uterine Fundus: firm Incision: Dressing is clean, dry, and intact DVT Evaluation: No evidence of DVT seen on physical exam. Labs: Lab Results  Component Value Date   WBC 11.9 (H) 01/10/2024   HGB 8.6 (L) 01/10/2024   HCT 25.2 (L) 01/10/2024   MCV 90.6 01/10/2024   PLT 274 01/10/2024      Latest Ref Rng & Units 01/10/2024    5:25 AM  CMP  Glucose 70 - 99 mg/dL 66   BUN 6 - 20 mg/dL 14   Creatinine 9.55 - 1.00  mg/dL 8.84   Sodium 864 - 854 mmol/L 137   Potassium 3.5 - 5.1 mmol/L 3.9   Chloride 98 - 111 mmol/L 102   CO2 22 - 32 mmol/L 25   Calcium  8.9 - 10.3 mg/dL 8.5   Total Protein 6.5 - 8.1 g/dL 5.2   Total Bilirubin 0.0 - 1.2 mg/dL <9.7   Alkaline Phos 38 - 126 U/L 82   AST 15 - 41 U/L 22   ALT 0 - 44 U/L 13    Edinburgh Score:    01/10/2024    2:37 PM  Edinburgh Postnatal Depression Scale Screening Tool  I have been able to laugh and see the funny side of things. 0  I have looked forward with enjoyment  to things. 1  I have blamed myself unnecessarily when things went wrong. 1  I have been anxious or worried for no good reason. 1  I have felt scared or panicky for no good reason. 1  Things have been getting on top of me. 1  I have been so unhappy that I have had difficulty sleeping. 0  I have felt sad or miserable. 1  I have been so unhappy that I have been crying. 0  The thought of harming myself has occurred to me. 0  Edinburgh Postnatal Depression Scale Total 6   Edinburgh Postnatal Depression Scale Total: 6   After visit meds:  Allergies as of 01/10/2024       Reactions   Ibuprofen Hives, Rash        Medication List     STOP taking these medications    Accu-Chek Guide w/Device Kit   Accu-Chek Softclix Lancets lancets   aspirin  EC 81 MG tablet   folic acid  1 MG tablet Commonly known as: FOLVITE    glucose blood test strip   hydrALAZINE  10 MG tablet Commonly known as: APRESOLINE    insulin  glargine 100 UNIT/ML Solostar Pen Commonly known as: LANTUS    NovoLOG  FlexPen 100 UNIT/ML FlexPen Generic drug: insulin  aspart   Pen Needles 32G X 4 MM Misc       TAKE these medications    Acetaminophen  Extra Strength 500 MG Tabs Commonly known as: TYLENOL  Take 2 tablets (1,000 mg total) by mouth every 6 (six) hours as needed. What changed:  how much to take when to take this   albuterol  108 (90 Base) MCG/ACT inhaler Commonly known as: VENTOLIN  HFA Inhale 2 puffs into the lungs every 6 (six) hours as needed for wheezing or shortness of breath.   FeroSul 325 (65 Fe) MG tablet Generic drug: ferrous sulfate  Take 1 tablet (325 mg total) by mouth every other day.   furosemide  20 MG tablet Commonly known as: LASIX  Take 1 tablet (20 mg total) by mouth daily.   labetalol  200 MG tablet Commonly known as: NORMODYNE  Take 4 tablets (800 mg total) by mouth 3 (three) times daily. What changed: how much to take   metoCLOPramide  10 MG tablet Commonly known as:  REGLAN  Take 1 tablet (10 mg total) by mouth 4 (four) times daily as needed for nausea or vomiting.   multivitamin-prenatal 27-0.8 MG Tabs tablet Take 1 tablet by mouth daily at 12 noon.   Nayzilam  5 MG/0.1ML Soln Generic drug: Midazolam  Place 5 mg into the nose as needed (For seizure cluster or seizure lasting more than 5 minutes).   NIFEdipine  60 MG 24 hr tablet Commonly known as: ADALAT  CC Take 1 tablet (60 mg total) by mouth 2 (two) times daily. What  changed:  how much to take how to take this when to take this additional instructions   ondansetron  8 MG disintegrating tablet Commonly known as: ZOFRAN -ODT Take 0.5 tablets (4 mg total) by mouth every 8 (eight) hours as needed for nausea.   oxcarbazepine  600 MG tablet Commonly known as: TRILEPTAL  Take 1.5 tablets (900 mg total) by mouth 2 (two) times daily.   oxyCODONE  5 MG immediate release tablet Commonly known as: Oxy IR/ROXICODONE  Take 1 tablet (5 mg total) by mouth every 4 (four) hours as needed for breakthrough pain.   potassium chloride  SA 20 MEQ tablet Commonly known as: KLOR-CON  M Take 1 tablet (20 mEq total) by mouth daily.   Stool Softener/Laxative 50-8.6 MG tablet Generic drug: senna-docusate Take 2 tablets by mouth at bedtime as needed for mild constipation.               Discharge Care Instructions  (From admission, onward)           Start     Ordered   01/10/24 0000  Discharge wound care:       Comments: Apply vaseline or aquaphor to incision as needed. You do not need a specific bandage/dressing   01/10/24 0957             Discharge home in stable condition Infant Feeding: Breast Infant Disposition:home with mother Discharge instruction: per After Visit Summary and Postpartum booklet. Activity: Advance as tolerated. Pelvic rest for 6 weeks.  Diet: routine diet Future Appointments: Future Appointments  Date Time Provider Department Center  01/25/2024 10:40 AM Tobb, Kardie, DO  [facility] H&V  02/13/2024  3:00 PM [facility] None  02/13/2024  3:15 PM Tonette Lauraine HERO, PA-C [facility] None  05/08/2024  2:45 PM Gregg Lek, MD [facility] None   Follow up Visit:  Follow-up Information     [facility] for [facility] Follow up in 1 week(s).   Specialty: Obstetrics and Gynecology Why: For blood pressure & incision check Contact information: [address] [place]  [address] [phone]        [facility] Follow up in 6 week(s).   Specialty: Obstetrics and Gynecology Why: For routine postpartum care and repeat 2 hour blood sugar test Contact information: [address] [place]  [address] [phone]                Message sent by Erik Please schedule this patient for a In person postpartum visit in 6 weeks with the following provider: MD Additional Postpartum F/U:2 hour GTT and BP/incision check 1 week  High risk pregnancy complicated by: HTN, CKD/FSGS,GDM Delivery mode:  Vaginal, Spontaneous Anticipated Birth Control:  BTL done [facility]   01/10/2024 Jerilynn DELENA Buddle, MD Cowritten with Dr. Erik   "

## 2024-01-07 NOTE — Progress Notes (Signed)
 Patient ID: Selena Meyer, female   DOB: 1985-12-19, 38 y.o. Reviewing labs, labile BPs with Dr. Barbra. BPs now severe range. Pt much more alert. A&O x 4. Bleeding stable in Jada suction tubing. Has not reached canister in >2 hours of observation.   Patient Vitals for the past 24 hrs:  BP Temp Temp src Pulse Resp SpO2 Height Weight  01/07/24 1251 (!) 164/95 -- -- 79 -- (!) 85 % -- --  01/07/24 1243 (!) 165/91 -- -- 86 -- -- -- --  01/07/24 1230 -- -- -- -- -- 93 % -- --  01/07/24 1227 (!) 167/89 -- -- 72 -- 94 % -- --  01/07/24 1218 (!) 159/98 -- -- 88 -- -- -- --  01/07/24 1211 (!) 159/65 -- -- 85 -- -- -- --  01/07/24 1209 (!) 167/91 -- -- 74 -- -- -- --  01/07/24 1206 (!) 162/88 -- -- 85 -- -- -- --  01/07/24 1201 (!) 145/64 -- -- 77 -- -- -- --  01/07/24 1156 125/72 -- -- (!) 168 -- -- -- --  01/07/24 1151 (!) 105/90 -- -- (!) 109 -- -- -- --  01/07/24 1146 (!) 118/100 -- -- 75 -- -- -- --  01/07/24 1144 -- -- -- -- -- 100 % -- --  01/07/24 1143 (!) 153/65 -- -- 65 -- -- -- --  01/07/24 1141 (!) 122/92 -- -- (!) 163 -- -- -- --  01/07/24 1138 (!) 80/55 -- -- 74 -- -- -- --  01/07/24 1137 (!) 84/69 -- -- 94 -- -- -- --  01/07/24 1131 119/68 97.8 F (36.6 C) -- 68 -- -- -- --  01/07/24 1126 103/63 -- -- 76 -- -- -- --  01/07/24 1121 127/75 -- -- 69 -- -- -- --  01/07/24 1116 128/63 -- -- 71 -- -- -- --  01/07/24 1112 118/69 -- -- 74 -- 100 % -- --  01/07/24 1111 118/69 -- -- 74 -- -- -- --  01/07/24 1106 120/66 -- -- 77 -- -- -- --  01/07/24 1105 (!) 105/55 -- -- 66 -- 100 % -- --  01/07/24 1102 (!) 90/44 -- -- (!) 58 -- -- -- --  01/07/24 1100 (!) 93/41 -- -- 62 -- 94 % -- --  01/07/24 1058 (!) 88/54 -- -- 62 -- -- -- --  01/07/24 1057 (!) 86/46 -- -- (!) 126 -- -- -- --  01/07/24 1051 (!) 117/50 -- -- (!) 51 -- 100 % -- --  01/07/24 1046 110/61 -- -- 66 -- -- -- --  01/07/24 1042 (!) 103/49 -- -- 60 -- -- -- --  01/07/24 1040 105/61 -- -- 63 -- 99 % -- --  01/07/24 1036  (!) 97/51 -- -- 60 -- -- -- --  01/07/24 1031 (!) 99/52 -- -- 62 -- -- -- --  01/07/24 1026 111/61 -- -- 63 -- -- -- --  01/07/24 1022 110/65 -- -- -- -- -- -- --  01/07/24 1018 111/65 -- -- 68 -- 99 % -- --  01/07/24 1011 122/67 -- -- 65 -- -- -- --  01/07/24 1010 -- -- -- -- -- 100 % -- --  01/07/24 1009 131/71 -- -- 69 -- -- -- --  01/07/24 1007 125/78 -- -- 71 -- -- -- --  01/07/24 1003 137/83 -- -- 75 -- 100 % -- --  01/07/24 1001 (!) 140/85 -- -- 78 -- -- -- --  01/07/24 0959 139/80 -- --  78 -- -- -- --  01/07/24 0958 133/80 -- -- 76 -- -- -- --  01/07/24 0956 135/84 -- -- 76 -- -- -- --  01/07/24 0955 -- -- -- -- -- 97 % -- --  01/07/24 0951 139/80 -- -- 77 -- -- -- --  01/07/24 0949 (!) 140/83 -- -- 81 -- 98 % -- --  01/07/24 0944 (!) 140/70 -- -- 80 -- 99 % -- --  01/07/24 0933 (!) 145/86 -- -- 88 -- -- -- --  01/07/24 0806 (!) 155/78 -- -- 81 -- -- -- --  01/07/24 0800 -- -- -- -- 18 -- -- --      Collection Time: 01/06/24  8:40 PM  Result Value Ref Range   Magnesium  4.1 (H) 1.7 - 2.4 mg/dL  CBC     Status: Abnormal   Collection Time: 01/07/24  4:08 AM  Result Value Ref Range   WBC 10.6 (H) 4.0 - 10.5 K/uL   RBC 3.83 (L) 3.87 - 5.11 MIL/uL   Hemoglobin 11.5 (L) 12.0 - 15.0 g/dL   HCT 65.9 (L) 63.9 - 53.9 %   MCV 88.8 80.0 - 100.0 fL   MCH 30.0 26.0 - 34.0 pg   MCHC 33.8 30.0 - 36.0 g/dL   RDW 87.0 88.4 - 84.4 %   Platelets 305 150 - 400 K/uL   nRBC 0.0 0.0 - 0.2 %  Comprehensive metabolic panel     Status: Abnormal   Collection Time: 01/07/24  4:08 AM  Result Value Ref Range   Sodium 134 (L) 135 - 145 mmol/L   Potassium 4.5 3.5 - 5.1 mmol/L   Chloride 101 98 - 111 mmol/L   CO2 23 22 - 32 mmol/L   Glucose, Bld 75 70 - 99 mg/dL   BUN 12 6 - 20 mg/dL   Creatinine, Ser 8.91 (H) 0.44 - 1.00 mg/dL   Calcium  8.5 (L) 8.9 - 10.3 mg/dL   Total Protein 6.1 (L) 6.5 - 8.1 g/dL   Albumin 3.3 (L) 3.5 - 5.0 g/dL   AST 26 15 - 41 U/L   ALT 15 0 - 44 U/L   Alkaline  Phosphatase 114 38 - 126 U/L   Total Bilirubin <0.2 0.0 - 1.2 mg/dL   GFR, Estimated >39 >39 mL/min   Anion gap 10 5 - 15  Glucose, capillary     Status: None   Collection Time: 01/07/24  5:46 AM  Result Value Ref Range   Glucose-Capillary 90 70 - 99 mg/dL  DIC Panel ONCE - STAT     Status: Abnormal   Collection Time: 01/07/24 10:04 AM  Result Value Ref Range   Prothrombin Time 14.6 11.4 - 15.2 seconds   INR 1.1 0.8 - 1.2   aPTT 28 24 - 36 seconds   Fibrinogen 540 (H) 210 - 475 mg/dL   D-Dimer, Quant 8.92 (H) 0.00 - 0.50 ug/mL-FEU   Platelets 296 150 - 400 K/uL   Smear Review NO SCHISTOCYTES SEEN   CBC     Status: Abnormal   Collection Time: 01/07/24 10:04 AM  Result Value Ref Range   WBC 13.2 (H) 4.0 - 10.5 K/uL   RBC 3.52 (L) 3.87 - 5.11 MIL/uL   Hemoglobin 10.6 (L) 12.0 - 15.0 g/dL   HCT 68.6 (L) 63.9 - 53.9 %   MCV 88.9 80.0 - 100.0 fL   MCH 30.1 26.0 - 34.0 pg   MCHC 33.9 30.0 - 36.0 g/dL   RDW 87.0 88.4 -  15.5 %   Platelets 287 150 - 400 K/uL   nRBC 0.0 0.0 - 0.2 %  Magnesium      Status: Abnormal   Collection Time: 01/07/24 11:31 AM  Result Value Ref Range   Magnesium  5.3 (H) 1.7 - 2.4 mg/dL  Comprehensive metabolic panel     Status: Abnormal   Collection Time: 01/07/24 11:31 AM  Result Value Ref Range   Sodium 132 (L) 135 - 145 mmol/L   Potassium 4.5 3.5 - 5.1 mmol/L   Chloride 103 98 - 111 mmol/L   CO2 17 (L) 22 - 32 mmol/L   Glucose, Bld 113 (H) 70 - 99 mg/dL   BUN 12 6 - 20 mg/dL   Creatinine, Ser 8.80 (H) 0.44 - 1.00 mg/dL   Calcium  7.4 (L) 8.9 - 10.3 mg/dL   Total Protein 4.9 (L) 6.5 - 8.1 g/dL   Albumin 2.6 (L) 3.5 - 5.0 g/dL   AST 27 15 - 41 U/L   ALT 11 0 - 44 U/L   Alkaline Phosphatase 94 38 - 126 U/L   Total Bilirubin <0.2 0.0 - 1.2 mg/dL   GFR, Estimated 59 (L) >60 mL/min   Anion gap 12 5 - 15    Plan: - Restart Mag Sulfate at 1 gm per hour. D/C 24 hours after delivery - D/C suction on Jada. In bleeding minimal after 30 minutes, may remove  Jada device.  - Will observe BPs for 30 minutes after Mag Sulfate restart and instructed RN to give IV Labetalol  if still severe range at 1315 (30 minutes after Mag Sulfate restart.)   - Plan Lasix , Procardia  60 XL BID next dose PM. Will determine of PO Labetalol  still needed.  - NPO after MN for BTL in am - Check CBC tonight and CBC,. CMP, Magnesium  level in am.  - Previously T&C 2 units in case transfusion needed. Pt gave consent for transfusion if indicated. No indication currently.  Selena Meyer  Selena Meyer 01/07/2024 12:51 PM

## 2024-01-08 ENCOUNTER — Telehealth: Payer: Self-pay

## 2024-01-08 LAB — COMPREHENSIVE METABOLIC PANEL WITH GFR
ALT: 9 U/L (ref 0–44)
AST: 26 U/L (ref 15–41)
Albumin: 2.5 g/dL — ABNORMAL LOW (ref 3.5–5.0)
Alkaline Phosphatase: 85 U/L (ref 38–126)
Anion gap: 8 (ref 5–15)
BUN: 13 mg/dL (ref 6–20)
CO2: 20 mmol/L — ABNORMAL LOW (ref 22–32)
Calcium: 7.6 mg/dL — ABNORMAL LOW (ref 8.9–10.3)
Chloride: 102 mmol/L (ref 98–111)
Creatinine, Ser: 1.29 mg/dL — ABNORMAL HIGH (ref 0.44–1.00)
GFR, Estimated: 54 mL/min — ABNORMAL LOW
Glucose, Bld: 100 mg/dL — ABNORMAL HIGH (ref 70–99)
Potassium: 4.2 mmol/L (ref 3.5–5.1)
Sodium: 131 mmol/L — ABNORMAL LOW (ref 135–145)
Total Bilirubin: <0.2 mg/dL (ref 0.0–1.2)
Total Protein: 4.7 g/dL — ABNORMAL LOW (ref 6.5–8.1)

## 2024-01-08 LAB — CBC
HCT: 25 % — ABNORMAL LOW (ref 36.0–46.0)
Hemoglobin: 8.6 g/dL — ABNORMAL LOW (ref 12.0–15.0)
MCH: 30.7 pg (ref 26.0–34.0)
MCHC: 34.4 g/dL (ref 30.0–36.0)
MCV: 89.3 fL (ref 80.0–100.0)
Platelets: 257 K/uL (ref 150–400)
RBC: 2.8 MIL/uL — ABNORMAL LOW (ref 3.87–5.11)
RDW: 13 % (ref 11.5–15.5)
WBC: 15.1 K/uL — ABNORMAL HIGH (ref 4.0–10.5)
nRBC: 0 % (ref 0.0–0.2)

## 2024-01-08 LAB — GLUCOSE, CAPILLARY
Glucose-Capillary: 101 mg/dL — ABNORMAL HIGH (ref 70–99)
Glucose-Capillary: 109 mg/dL — ABNORMAL HIGH (ref 70–99)

## 2024-01-08 MED ORDER — LABETALOL HCL 200 MG PO TABS
600.0000 mg | ORAL_TABLET | Freq: Three times a day (TID) | ORAL | Status: DC
  Administered 2024-01-08 – 2024-01-09 (×3): 600 mg via ORAL
  Filled 2024-01-08 (×3): qty 3

## 2024-01-08 NOTE — Inpatient Diabetes Management (Signed)
 Inpatient Diabetes Program Recommendations  AACE/ADA: New Consensus Statement on Inpatient Glycemic Control (2015)  Target Ranges:  Prepandial:   less than 140 mg/dL      Peak postprandial:   less than 180 mg/dL (1-2 hours)      Critically ill patients:  140 - 180 mg/dL   Lab Results  Component Value Date   GLUCAP 141 (H) 01/07/2024   HGBA1C 5.8 (H) 08/14/2023    Review of Glycemic Control  Latest Reference Range & Units 01/07/24 05:46 01/07/24 10:20 01/07/24 23:24  Glucose-Capillary 70 - 99 mg/dL 90 869 (H) 858 (H)  (H): Data is abnormally high Diabetes history: GDM Outpatient Diabetes medications: Lantus  10 units every day, Novolog  6 units TID Current orders for Inpatient glycemic control: none, CBG x 1 post delivery  Inpatient Diabetes Program Recommendations:    Noted consult. In agreement with current plan of care/orders.   Thanks, Tinnie Minus, MSN, RNC-OB Diabetes Coordinator [phone] (8a-5p)

## 2024-01-08 NOTE — Telephone Encounter (Signed)
 Pt called into office to notify of delivery and how delivery went. RN offered congratulations after delivery.   [name], RN

## 2024-01-08 NOTE — Progress Notes (Signed)
 POSTPARTUM PROGRESS NOTE  Subjective: 38 y.o. H7E9797 who is PPD1 s/p NSVD  Course complicated by: CHTN with superimposed preeclampsia (severe by blood pressures), GDMA2, postpartum hemorrhage (EBL 1948- received TXA, methergine, cytotec , Jada)  Doing well, no concerns. Reports no significant pain or bleeding. Feels tired  Objective: Blood pressure 137/76, pulse 74, temperature 98.2 F (36.8 C), temperature source Oral, resp. rate 16, height 5' 4 (1.626 m), weight 89.4 kg, last menstrual period 04/27/2023, SpO2 (P) 98%, unknown if currently breastfeeding.  Physical Exam:  General: alert, cooperative, and no distress Abdomen: soft, nontender nondistended Uterine Fundus: firm Lochia: appropriate Incision: n/a Lower extremities: No significant calf/ankle edema.  Labs:    Latest Ref Rng & Units 01/08/2024    4:35 AM 01/07/2024    5:15 PM 01/07/2024   10:04 AM  CBC  WBC 4.0 - 10.5 K/uL 15.1  16.9  13.2   Hemoglobin 12.0 - 15.0 g/dL 8.6  9.6  89.3   Hematocrit 36.0 - 46.0 % 25.0  28.2  31.3   Platelets 150 - 400 K/uL 257  256  296    287       Latest Ref Rng & Units 01/08/2024    4:35 AM 01/07/2024    5:15 PM 01/07/2024   11:31 AM  CMP  Glucose 70 - 99 mg/dL 899  896  886   BUN 6 - 20 mg/dL 13  11  12    Creatinine 0.44 - 1.00 mg/dL 8.70  8.87  8.80   Sodium 135 - 145 mmol/L 131  131  132   Potassium 3.5 - 5.1 mmol/L 4.2  4.5  4.5   Chloride 98 - 111 mmol/L 102  101  103   CO2 22 - 32 mmol/L 20  22  17    Calcium  8.9 - 10.3 mg/dL 7.6  7.5  7.4   Total Protein 6.5 - 8.1 g/dL 4.7  5.0  4.9   Total Bilirubin 0.0 - 1.2 mg/dL <9.7  0.2  <9.7   Alkaline Phos 38 - 126 U/L 85  96  94   AST 15 - 41 U/L 26  28  27    ALT 0 - 44 U/L 9  12  11       Assessment/Plan: Postpartum: routine care - undecided on BTL at this time, will defer for now - Rh status: RH POS - Rubella status: immune  2. CHTN with superimposed preeclampsia: blood pressures mild range s/p delivery, on  labetalol  400 mg TID and procardia  60 mg BID - increase labetalol  to 600 mg TID - continue lasix  - off mag this morning at 24 hours  3. Seizure disorder: continue trileptal   4. GDMA2: previously on semglee  10 units q am and novolog  with meals - BS 141 after delivery then 100 this am - 2 hr GTT at postpartum follow up  5. FSGS: baseline creatinine around 1, this morning 1.29 - daily labs  6. Dispo: inpatient management   LOS: 2 days    Rollo ONEIDA Bring, MD, FACOG Obstetrician & Gynecologist, [facility] for [facility], [facility] 01/08/2024, 8:33 AM

## 2024-01-09 ENCOUNTER — Other Ambulatory Visit (HOSPITAL_COMMUNITY): Payer: Self-pay

## 2024-01-09 ENCOUNTER — Encounter (HOSPITAL_COMMUNITY)
Admission: AD | Disposition: A | Discharge status: Home / Self Care | Payer: Self-pay | Source: Home / Self Care | Attending: Obstetrics and Gynecology

## 2024-01-09 ENCOUNTER — Other Ambulatory Visit

## 2024-01-09 ENCOUNTER — Encounter: Admitting: Obstetrics and Gynecology

## 2024-01-09 ENCOUNTER — Encounter: Payer: Self-pay | Admitting: Family

## 2024-01-09 ENCOUNTER — Inpatient Hospital Stay (HOSPITAL_COMMUNITY): Admitting: Anesthesiology

## 2024-01-09 ENCOUNTER — Encounter: Payer: Self-pay | Admitting: Obstetrics and Gynecology

## 2024-01-09 ENCOUNTER — Other Ambulatory Visit: Payer: Self-pay | Admitting: Pharmacist

## 2024-01-09 DIAGNOSIS — N051 Unspecified nephritic syndrome with focal and segmental glomerular lesions: Secondary | ICD-10-CM

## 2024-01-09 DIAGNOSIS — Z302 Encounter for sterilization: Secondary | ICD-10-CM

## 2024-01-09 HISTORY — PX: TUBAL LIGATION: SHX77

## 2024-01-09 LAB — COMPREHENSIVE METABOLIC PANEL WITH GFR
ALT: 10 U/L (ref 0–44)
AST: 22 U/L (ref 15–41)
Albumin: 2.6 g/dL — ABNORMAL LOW (ref 3.5–5.0)
Alkaline Phosphatase: 77 U/L (ref 38–126)
Anion gap: 9 (ref 5–15)
BUN: 16 mg/dL (ref 6–20)
CO2: 21 mmol/L — ABNORMAL LOW (ref 22–32)
Calcium: 8.3 mg/dL — ABNORMAL LOW (ref 8.9–10.3)
Chloride: 106 mmol/L (ref 98–111)
Creatinine, Ser: 1.15 mg/dL — ABNORMAL HIGH (ref 0.44–1.00)
GFR, Estimated: >60 mL/min
Glucose, Bld: 105 mg/dL — ABNORMAL HIGH (ref 70–99)
Potassium: 3.8 mmol/L (ref 3.5–5.1)
Sodium: 137 mmol/L (ref 135–145)
Total Bilirubin: <0.2 mg/dL (ref 0.0–1.2)
Total Protein: 4.7 g/dL — ABNORMAL LOW (ref 6.5–8.1)

## 2024-01-09 LAB — CBC
HCT: 23.3 % — ABNORMAL LOW (ref 36.0–46.0)
Hemoglobin: 7.8 g/dL — ABNORMAL LOW (ref 12.0–15.0)
MCH: 30.5 pg (ref 26.0–34.0)
MCHC: 33.5 g/dL (ref 30.0–36.0)
MCV: 91 fL (ref 80.0–100.0)
Platelets: 257 K/uL (ref 150–400)
RBC: 2.56 MIL/uL — ABNORMAL LOW (ref 3.87–5.11)
RDW: 13.1 % (ref 11.5–15.5)
WBC: 11.9 K/uL — ABNORMAL HIGH (ref 4.0–10.5)
nRBC: 0 % (ref 0.0–0.2)

## 2024-01-09 LAB — GLUCOSE, CAPILLARY
Glucose-Capillary: 157 mg/dL — ABNORMAL HIGH (ref 70–99)
Glucose-Capillary: 75 mg/dL (ref 70–99)

## 2024-01-09 SURGERY — LIGATION, FALLOPIAN TUBE, POSTPARTUM
Anesthesia: Epidural | Site: Abdomen

## 2024-01-09 MED ORDER — HYDRALAZINE HCL 20 MG/ML IJ SOLN: 10.0000 mg | INTRAMUSCULAR | Status: DC | PRN

## 2024-01-09 MED ORDER — PROPOFOL 10 MG/ML IV BOLUS
INTRAVENOUS | Status: AC
  Filled 2024-01-09: qty 20

## 2024-01-09 MED ORDER — DEXMEDETOMIDINE HCL IN NACL 80 MCG/20ML IV SOLN
INTRAVENOUS | Status: DC | PRN
  Administered 2024-01-09 (×2): 8 ug via INTRAVENOUS

## 2024-01-09 MED ORDER — PROPOFOL 500 MG/50ML IV EMUL
INTRAVENOUS | Status: AC
  Filled 2024-01-09: qty 50

## 2024-01-09 MED ORDER — MIDAZOLAM HCL 2 MG/2ML IJ SOLN
INTRAMUSCULAR | Status: AC
  Filled 2024-01-09: qty 2

## 2024-01-09 MED ORDER — LIDOCAINE-EPINEPHRINE (PF) 2 %-1:200000 IJ SOLN
INTRAMUSCULAR | Status: AC
  Filled 2024-01-09: qty 20

## 2024-01-09 MED ORDER — BUPIVACAINE HCL (PF) 0.25 % IJ SOLN
INTRAMUSCULAR | Status: AC
  Filled 2024-01-09: qty 10

## 2024-01-09 MED ORDER — SENNOSIDES-DOCUSATE SODIUM 8.6-50 MG PO TABS
2.0000 | ORAL_TABLET | Freq: Every evening | ORAL | 0 refills | Status: AC | PRN
  Filled 2024-01-09: qty 20, 10d supply, fill #0

## 2024-01-09 MED ORDER — OXYCODONE HCL 5 MG PO TABS
5.0000 mg | ORAL_TABLET | ORAL | 0 refills | Status: AC | PRN
  Filled 2024-01-09: qty 8, 2d supply, fill #0

## 2024-01-09 MED ORDER — DEXMEDETOMIDINE HCL IN NACL 80 MCG/20ML IV SOLN
INTRAVENOUS | Status: AC
  Filled 2024-01-09: qty 20

## 2024-01-09 MED ORDER — MIDAZOLAM HCL 5 MG/5ML IJ SOLN
INTRAMUSCULAR | Status: DC | PRN
  Administered 2024-01-09: 2 mg via INTRAVENOUS

## 2024-01-09 MED ORDER — FERROUS SULFATE 325 (65 FE) MG PO TABS
325.0000 mg | ORAL_TABLET | ORAL | Status: DC
  Administered 2024-01-09: 325 mg via ORAL
  Filled 2024-01-09: qty 1

## 2024-01-09 MED ORDER — LACTATED RINGERS IV SOLN: INTRAVENOUS | Status: DC | PRN

## 2024-01-09 MED ORDER — PROPOFOL 10 MG/ML IV BOLUS
INTRAVENOUS | Status: DC | PRN
  Administered 2024-01-09: 20 mg via INTRAVENOUS
  Administered 2024-01-09: 100 ug/kg/min via INTRAVENOUS
  Administered 2024-01-09: 20 mg via INTRAVENOUS
  Administered 2024-01-09: 40 mg via INTRAVENOUS
  Administered 2024-01-09 (×2): 20 mg via INTRAVENOUS

## 2024-01-09 MED ORDER — LABETALOL HCL 5 MG/ML IV SOLN: 20.0000 mg | INTRAVENOUS | Status: DC | PRN

## 2024-01-09 MED ORDER — LIDOCAINE 2% (20 MG/ML) 5 ML SYRINGE
INTRAMUSCULAR | Status: AC
  Filled 2024-01-09: qty 5

## 2024-01-09 MED ORDER — LABETALOL HCL 200 MG PO TABS: 800.0000 mg | ORAL_TABLET | Freq: Three times a day (TID) | ORAL | Status: DC

## 2024-01-09 MED ORDER — OXYCODONE HCL 5 MG PO TABS: 5.0000 mg | ORAL_TABLET | Freq: Once | ORAL | Status: DC | PRN

## 2024-01-09 MED ORDER — LABETALOL HCL 5 MG/ML IV SOLN: 40.0000 mg | INTRAVENOUS | Status: DC | PRN

## 2024-01-09 MED ORDER — FENTANYL CITRATE (PF) 100 MCG/2ML IJ SOLN
INTRAMUSCULAR | Status: DC | PRN
  Administered 2024-01-09: 100 ug via EPIDURAL

## 2024-01-09 MED ORDER — LIDOCAINE-EPINEPHRINE (PF) 2 %-1:200000 IJ SOLN
INTRAMUSCULAR | Status: DC | PRN
  Administered 2024-01-09: 3 mL via EPIDURAL
  Administered 2024-01-09: 10 mL via EPIDURAL
  Administered 2024-01-09: 1 mL via EPIDURAL
  Administered 2024-01-09: 5 mL via EPIDURAL
  Administered 2024-01-09 (×2): 2 mL via EPIDURAL

## 2024-01-09 MED ORDER — HYDROMORPHONE HCL 1 MG/ML IJ SOLN
INTRAMUSCULAR | Status: AC
  Filled 2024-01-09: qty 0.5

## 2024-01-09 MED ORDER — STERILE WATER FOR IRRIGATION IR SOLN
Status: DC | PRN
  Administered 2024-01-09: 1000 mL

## 2024-01-09 MED ORDER — LABETALOL HCL 200 MG PO TABS
600.0000 mg | ORAL_TABLET | Freq: Three times a day (TID) | ORAL | Status: DC
  Administered 2024-01-09 – 2024-01-10 (×3): 600 mg via ORAL
  Filled 2024-01-09 (×3): qty 3

## 2024-01-09 MED ORDER — HYDRALAZINE HCL 20 MG/ML IJ SOLN: 5.0000 mg | INTRAMUSCULAR | Status: DC | PRN

## 2024-01-09 MED ORDER — BUPIVACAINE HCL (PF) 0.25 % IJ SOLN
INTRAMUSCULAR | Status: DC | PRN
  Administered 2024-01-09: 4 mL

## 2024-01-09 MED ORDER — LABETALOL HCL 200 MG PO TABS
800.0000 mg | ORAL_TABLET | Freq: Three times a day (TID) | ORAL | 0 refills | Status: AC
  Filled 2024-01-09: qty 180, 15d supply, fill #0

## 2024-01-09 MED ORDER — NIFEDIPINE ER 60 MG PO TB24
60.0000 mg | ORAL_TABLET | Freq: Two times a day (BID) | ORAL | 0 refills | Status: AC
  Filled 2024-01-09: qty 60, 30d supply, fill #0

## 2024-01-09 MED ORDER — HYDROMORPHONE HCL 1 MG/ML IJ SOLN
0.2500 mg | INTRAMUSCULAR | Status: DC | PRN
  Administered 2024-01-09 (×2): 0.5 mg via INTRAVENOUS

## 2024-01-09 MED ORDER — OXYCODONE HCL 5 MG/5ML PO SOLN: 5.0000 mg | Freq: Once | ORAL | Status: DC | PRN

## 2024-01-09 MED ORDER — ONDANSETRON HCL 4 MG/2ML IJ SOLN
INTRAMUSCULAR | Status: AC
  Filled 2024-01-09: qty 2

## 2024-01-09 MED ORDER — FUROSEMIDE 20 MG PO TABS
20.0000 mg | ORAL_TABLET | Freq: Every day | ORAL | 0 refills | Status: AC
  Filled 2024-01-09: qty 5, 5d supply, fill #0

## 2024-01-09 MED ORDER — FENTANYL CITRATE (PF) 100 MCG/2ML IJ SOLN
INTRAMUSCULAR | Status: AC
  Filled 2024-01-09: qty 2

## 2024-01-09 MED ORDER — AMISULPRIDE (ANTIEMETIC) 5 MG/2ML IV SOLN: 10.0000 mg | Freq: Once | INTRAVENOUS | Status: DC | PRN

## 2024-01-09 MED ORDER — ACETAMINOPHEN 500 MG PO TABS
1000.0000 mg | ORAL_TABLET | Freq: Four times a day (QID) | ORAL | 0 refills | Status: AC | PRN
  Filled 2024-01-09: qty 30, 4d supply, fill #0

## 2024-01-09 MED ORDER — LIDOCAINE HCL (CARDIAC) PF 100 MG/5ML IV SOSY
PREFILLED_SYRINGE | INTRAVENOUS | Status: DC | PRN
  Administered 2024-01-09: 20 mg via INTRAVENOUS

## 2024-01-09 SURGICAL SUPPLY — 23 items
BLADE SURG 11 STRL SS (BLADE) ×1 IMPLANT
DISSECTOR SURG LIGASURE 21 (MISCELLANEOUS) ×1 IMPLANT
DRSG OPSITE POSTOP 3X4 (GAUZE/BANDAGES/DRESSINGS) ×1 IMPLANT
DURAPREP 26ML APPLICATOR (WOUND CARE) ×1 IMPLANT
GAUZE PAD ABD 7.5X8 STRL (GAUZE/BANDAGES/DRESSINGS) IMPLANT
GAUZE SPONGE 4X4 12PLY STRL (GAUZE/BANDAGES/DRESSINGS) IMPLANT
GLOVE BIOGEL PI IND STRL 7.0 (GLOVE) ×1 IMPLANT
GLOVE BIOGEL PI IND STRL 7.5 (GLOVE) ×1 IMPLANT
GLOVE ECLIPSE 7.5 STRL STRAW (GLOVE) ×1 IMPLANT
GOWN STRL REUS W/TWL LRG LVL3 (GOWN DISPOSABLE) ×2 IMPLANT
HIBICLENS CHG 4% 4OZ BTL (MISCELLANEOUS) ×1 IMPLANT
MAT PREVALON FULL STRYKER (MISCELLANEOUS) IMPLANT
NEEDLE HYPO 22GX1.5 SAFETY (NEEDLE) ×1 IMPLANT
NS IRRIG 1000ML POUR BTL (IV SOLUTION) ×1 IMPLANT
PACK ABDOMINAL MINOR (CUSTOM PROCEDURE TRAY) ×1 IMPLANT
PROTECTOR NERVE ULNAR (MISCELLANEOUS) ×1 IMPLANT
RETRACTOR WOUND ALXS 19CM XSML (INSTRUMENTS) ×1 IMPLANT
SPONGE LAP 4X18 RFD (DISPOSABLE) IMPLANT
SUT VIC AB 2-0 CT1 (SUTURE) IMPLANT
SUT VICRYL 0 UR6 27IN ABS (SUTURE) ×1 IMPLANT
SUT VICRYL 4-0 PS2 18IN ABS (SUTURE) ×1 IMPLANT
SYR CONTROL 10ML LL (SYRINGE) ×1 IMPLANT
TOWEL OR 17X24 6PK STRL BLUE (TOWEL DISPOSABLE) ×2 IMPLANT

## 2024-01-09 NOTE — Progress Notes (Signed)
 Placing BMP orders per Dr Sheena

## 2024-01-09 NOTE — Op Note (Signed)
 Selena Meyer 01/06/2024 - 01/09/2024  PREOPERATIVE DIAGNOSES: Undesired fertility/request for sterilization  POSTOPERATIVE DIAGNOSES: Same  PROCEDURE:  Postpartum Bilateral Partial Salpingectomy  SURGEON: Dr. Kieth Carolin  ANESTHESIA:  Epidural and local analgesia using 0.25% Marcaine   COMPLICATIONS:  None immediate.  ESTIMATED BLOOD LOSS: 15 ml.  INDICATIONS:  38 y.o. H7E9797 with undesired fertility, status post vaginal delivery, desires permanent sterilization.  Other reversible forms of contraception were discussed with patient; she declines all other modalities. Risks of procedure discussed with patient including but not limited to: risk of regret, permanence of method, bleeding, infection, injury to surrounding organs and need for additional procedures.  Failure risk of 1% with increased risk of ectopic gestation if pregnancy occurs was also discussed with patient.      FINDINGS:  Normal uterus, tubes, and ovaries.  PROCEDURE DETAILS: The patient was taken to the operating room where her epidural anesthesia was dosed up to surgical level and found to be adequate.  She was then placed in the dorsal supine position and prepped and draped in sterile fashion.  After an adequate timeout was performed, attention was turned to the patient's abdomen where a 4cm transverse skin incision was made under the umbilical fold. The incision was taken down to the layer of fascia using blunt dissection. The fascia was elevated, incised sharply, and extended bilaterally using Mayo scissors/Bovie. The peritoneum was entered bluntly fashion. Brisk bleeding was noted from a small vessel on the peritoneum/fascia so this was grasped & cauterized with the Bovie. Attention was then turned to the patient's uterus. Visualization was particularly difficult due to movement of abdominal wall, omentum and bowel. Omentum/bowel was packed back with a Raytec and patient was repositioned (Trendelenburg, left lateral  tilt) and right tube still could not be fully visualized. A small Alexis retractor was placed with limited improvement. The skin & fascial incisions were then extended sharply bilaterally. The right fallopian tube could then be grasped with Babcock clamps and followed out to the fimbriated end. A LigaSure was used to seal and transect the mesosalpinx along the length of the tube from the fimbriated end to about 2-3cm from the cornua which was as close as we could safely seal/transect. At least 6cm length of tube was removed in 2 segments. The pedicle was inspected and found to be hemostatic. The patient was then placed into right lateral tilt and attention was turned to the left tube. Tube was walked to the fimbriated end. It was difficult to safely isolate the entire tube again on this side, so an approximately 6cm portion of the tube was removed with the Ligasure device. Patient's abdominal wall rose due to breathing during this process and a small part of skin came into contact with the Ligasure during sealing and was slightly burned.  Bilateral surgical sites were examined and good hemostasis was noted. Previous area of bleeding on abdominal wall was re-inspected and hemostatic. The instruments were then removed from the patient's abdomen and the fascial incision was repaired with 0 Vicryl in a running fashion. A single interrupted suture of 2-0 vicryl was used to close subcutaneous tissue. The skin was closed with a 4-0 monocryl subcuticular stitch. The patient tolerated the procedure well. Petroleum jelly was placed over the incision due to the mild burn followed by a pressure dressing.  Instrument, sponge, and needle counts were correct times two.  The patient was then taken to the recovery room awake and in stable condition.  Kieth Carolin, MD Obstetrician & Gynecologist, Faculty  [facility] [employer], [facility]

## 2024-01-09 NOTE — Progress Notes (Signed)
 Post Partum Day 2 s/p SVD c/b PPH, SIPE w/ SF (BP) Subjective: Sore but doing well. OOB without difficulty. Tolerating regular diet, but NPO @ MN in anticipation of BTL today.  Denies HA, visual changes, CP/SOB, RUQ/epigastric pain  Objective: Blood pressure (!) 153/77, pulse 80, temperature 98.7 F (37.1 C), temperature source Oral, resp. rate 18, height 5' 4 (1.626 m), weight 89.4 kg, last menstrual period 04/27/2023, SpO2 99%, unknown if currently breastfeeding.  Physical Exam:  General: alert, cooperative, and no distress Lochia: appropriate Uterine Fundus: firm DVT Evaluation: No evidence of DVT seen on physical exam.  Recent Labs    01/08/24 0435 01/09/24 0432  HGB 8.6* 7.8*  HCT 25.0* 23.3*    Assessment/Plan: Postpartum Recovering appropriately, however multiple medical comorbidities present (see below) - Contraception: BTL today  - She desires permanent sterilization. Discussed alternatives including LARC options and vasectomy. She declines these options.  - Discussed surgery of salpingectomy due to lower risk of contraceptive failure and potential ovarian cancer risk reduction. She is agreeable to this.  - Risks of surgery include but are not limited to: bleeding, infection, injury to surrounding organs/tissues (i.e. bowel/bladder/ureters), need for additional procedures, wound complications, hospital re-admission, and conversion to open surgery/larger incision, VTE. We reviewed risk of contraceptive failure and risk of regret.  - Reviewed restrictions and recovery following surgery - Consent signed and tubal posted for this afternoon - MOF: formula - Rh status: Rh+ - Rubella status: RI - Dispo: anticipate discharge home PPD3  2. 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 F/u repeat CMP tomorrow AM - Continue lasix  x 5d PP  3. Epilepsy - Continue home trileptal   4. PPH, clinically significant acute blood loss anemia due to unexpected blood loss - Hgb equilibrating, down to 7.8 this AM - Currently asymptomatic - Will repeat CBC tomorrow AM after BTL - PO iron  ordered  5. Neonatal - Doing well at bedside - Circumcision desired, consent signed  Attending Circumcision Counseling Progress Note  Patient desires circumcision for her female infant.  Circumcision procedure details discussed, risks and benefits of procedure were also discussed.  These include but are not limited to: Benefits of circumcision in men include reduction in the rates of urinary tract infection (UTI), penile cancer, some sexually transmitted infections, penile inflammatory and retractile disorders, as well as easier hygiene.  Risks include bleeding , infection, injury of glans which may lead to penile deformity or urinary tract issues, unsatisfactory cosmetic appearance and other potential complications related to the procedure.  It was emphasized that this is an elective procedure.  Patient wants to proceed with circumcision; written informed consent obtained.  Will do circumcision soon, routine circumcision and post circumcision care ordered for the infant.  Selena JAYSON Carolin, MD 01/09/2024 9:42 AM  Dispo: continue inpatient admission, anticipate discharge home PPD3   LOS: 3 days   Selena Meyer 01/09/2024, 9:35 AM

## 2024-01-09 NOTE — Transfer of Care (Signed)
 Immediate Anesthesia Transfer of Care Note  Patient: Selena Meyer  Procedure(s) Performed: LIGATION, FALLOPIAN TUBE, POSTPARTUM (Abdomen)  Patient Location: PACU  Anesthesia Type:Epidural  Level of Consciousness: awake  Airway & Oxygen Therapy: Patient Spontanous Breathing  Post-op Assessment: Report given to RN and Post -op Vital signs reviewed and stable  Post vital signs: Reviewed and stable  Last Vitals:  Vitals Value Taken Time  BP    Temp    Pulse    Resp    SpO2      Last Pain:  Vitals:   01/09/24 1615  TempSrc: Oral  PainSc:       Patients Stated Pain Goal: 3 (01/07/24 1449)  Complications: No notable events documented.

## 2024-01-09 NOTE — Anesthesia Postprocedure Evaluation (Signed)
"   Anesthesia Post Note  Patient: Selena Meyer  Procedure(s) Performed: LIGATION, FALLOPIAN TUBE, POSTPARTUM (Abdomen)     Patient location during evaluation: PACU Anesthesia Type: Epidural Level of consciousness: awake and alert Pain management: pain level controlled Vital Signs Assessment: post-procedure vital signs reviewed and stable Respiratory status: spontaneous breathing, nonlabored ventilation and respiratory function stable Cardiovascular status: blood pressure returned to baseline and stable Postop Assessment: no apparent nausea or vomiting Anesthetic complications: no   No notable events documented.  Last Vitals:  Vitals:   01/09/24 2145 01/09/24 2214  BP: (!) 176/94 (!) 177/92  Pulse: 65   Resp: (!) 8   Temp: 36.8 C   SpO2: 96%     Last Pain:  Vitals:   01/09/24 2145  TempSrc: Oral  PainSc:    Pain Goal: Patients Stated Pain Goal: 3 (01/07/24 1449)  LLE Motor Response: Purposeful movement (01/09/24 2145) LLE Sensation: Tingling (01/09/24 2145) RLE Motor Response: Purposeful movement (01/09/24 2145) RLE Sensation: Tingling (01/09/24 2145)     Epidural/Spinal Function Cutaneous sensation: Tingles (01/09/24 2145), Patient able to flex knees: Yes (01/09/24 2145), Patient able to lift hips off bed: Yes (01/09/24 2145), Back pain beyond tenderness at insertion site: No (01/09/24 2145), Progressively worsening motor and/or sensory loss: No (01/09/24 2145), Bowel and/or bladder incontinence post epidural: No (01/09/24 2145)  Butler Levander Pinal      "

## 2024-01-09 NOTE — Anesthesia Preprocedure Evaluation (Signed)
"                                    Anesthesia Evaluation  Patient identified by MRN, date of birth, ID band Patient awake    Reviewed: Allergy & Precautions, H&P , NPO status , Patient's Chart, lab work & pertinent test results  Airway Mallampati: II  TM Distance: >3 FB Neck ROM: Full    Dental no notable dental hx.    Pulmonary neg pulmonary ROS, former smoker   Pulmonary exam normal breath sounds clear to auscultation       Cardiovascular hypertension, negative cardio ROS Normal cardiovascular exam Rhythm:Regular Rate:Normal     Neuro/Psych  Headaches  negative psych ROS   GI/Hepatic negative GI ROS, Neg liver ROS,,,  Endo/Other  negative endocrine ROSdiabetes    Renal/GU negative Renal ROS  negative genitourinary   Musculoskeletal negative musculoskeletal ROS (+)    Abdominal   Peds negative pediatric ROS (+)  Hematology negative hematology ROS (+)   Anesthesia Other Findings   Reproductive/Obstetrics negative OB ROS                              Anesthesia Physical Anesthesia Plan  ASA: 2  Anesthesia Plan: Epidural   Post-op Pain Management:    Induction:   PONV Risk Score and Plan: Treatment may vary due to age or medical condition  Airway Management Planned: Natural Airway  Additional Equipment:   Intra-op Plan:   Post-operative Plan:   Informed Consent: I have reviewed the patients History and Physical, chart, labs and discussed the procedure including the risks, benefits and alternatives for the proposed anesthesia with the patient or authorized representative who has indicated his/her understanding and acceptance.     Dental advisory given  Plan Discussed with: CRNA  Anesthesia Plan Comments:         Anesthesia Quick Evaluation  "

## 2024-01-10 ENCOUNTER — Encounter: Payer: Self-pay | Admitting: Family

## 2024-01-10 ENCOUNTER — Encounter (HOSPITAL_COMMUNITY): Payer: Self-pay | Admitting: Obstetrics and Gynecology

## 2024-01-10 ENCOUNTER — Encounter: Payer: Self-pay | Admitting: Obstetrics and Gynecology

## 2024-01-10 LAB — CBC
HCT: 25.2 % — ABNORMAL LOW (ref 36.0–46.0)
Hemoglobin: 8.6 g/dL — ABNORMAL LOW (ref 12.0–15.0)
MCH: 30.9 pg (ref 26.0–34.0)
MCHC: 34.1 g/dL (ref 30.0–36.0)
MCV: 90.6 fL (ref 80.0–100.0)
Platelets: 274 K/uL (ref 150–400)
RBC: 2.78 MIL/uL — ABNORMAL LOW (ref 3.87–5.11)
RDW: 13.1 % (ref 11.5–15.5)
WBC: 11.9 K/uL — ABNORMAL HIGH (ref 4.0–10.5)
nRBC: 0 % (ref 0.0–0.2)

## 2024-01-10 LAB — COMPREHENSIVE METABOLIC PANEL WITH GFR
ALT: 13 U/L (ref 0–44)
AST: 22 U/L (ref 15–41)
Albumin: 2.8 g/dL — ABNORMAL LOW (ref 3.5–5.0)
Alkaline Phosphatase: 82 U/L (ref 38–126)
Anion gap: 10 (ref 5–15)
BUN: 14 mg/dL (ref 6–20)
CO2: 25 mmol/L (ref 22–32)
Calcium: 8.5 mg/dL — ABNORMAL LOW (ref 8.9–10.3)
Chloride: 102 mmol/L (ref 98–111)
Creatinine, Ser: 1.15 mg/dL — ABNORMAL HIGH (ref 0.44–1.00)
GFR, Estimated: >60 mL/min
Glucose, Bld: 66 mg/dL — ABNORMAL LOW (ref 70–99)
Potassium: 3.9 mmol/L (ref 3.5–5.1)
Sodium: 137 mmol/L (ref 135–145)
Total Bilirubin: <0.2 mg/dL (ref 0.0–1.2)
Total Protein: 5.2 g/dL — ABNORMAL LOW (ref 6.5–8.1)

## 2024-01-10 LAB — TYPE AND SCREEN
ABO/RH(D): A POS
Antibody Screen: NEGATIVE
Unit division: 0
Unit division: 0

## 2024-01-10 LAB — BPAM RBC
Blood Product Expiration Date: 202601202359
Blood Product Expiration Date: 202601202359
Unit Type and Rh: 6200
Unit Type and Rh: 6200

## 2024-01-10 MED ORDER — ACETAMINOPHEN 500 MG PO TABS
1000.0000 mg | ORAL_TABLET | Freq: Four times a day (QID) | ORAL | Status: DC
  Administered 2024-01-10 (×2): 1000 mg via ORAL
  Filled 2024-01-10 (×2): qty 2

## 2024-01-10 MED ORDER — POTASSIUM CHLORIDE CRYS ER 20 MEQ PO TBCR
20.0000 meq | EXTENDED_RELEASE_TABLET | Freq: Every day | ORAL | Status: DC
  Administered 2024-01-10: 20 meq via ORAL
  Filled 2024-01-10: qty 1

## 2024-01-10 MED ORDER — CYCLOBENZAPRINE HCL 10 MG PO TABS
10.0000 mg | ORAL_TABLET | Freq: Three times a day (TID) | ORAL | Status: DC | PRN
  Administered 2024-01-10: 10 mg via ORAL
  Filled 2024-01-10: qty 1

## 2024-01-10 MED ORDER — LABETALOL HCL 200 MG PO TABS
800.0000 mg | ORAL_TABLET | Freq: Three times a day (TID) | ORAL | Status: DC
  Administered 2024-01-10: 800 mg via ORAL
  Filled 2024-01-10: qty 4

## 2024-01-10 MED ORDER — POTASSIUM CHLORIDE CRYS ER 20 MEQ PO TBCR: 20.0000 meq | EXTENDED_RELEASE_TABLET | Freq: Every day | ORAL | 0 refills | Status: AC

## 2024-01-10 NOTE — Progress Notes (Signed)
 Dr. Zina notified of pt's bp of 164/83. Retake bp 151/87. 800mg  Labetalol  given. [name], RN

## 2024-01-10 NOTE — Progress Notes (Signed)
 Post Partum Day 3 s/p SVD c/b PPH, SIPE w/ SF (BP); POD1 s/p PP BTL Subjective: Pain around incision. Was able to get OOB and do a lap around her room this morning. Tolerating regular diet, voiding spontaneously.   Objective: Blood pressure (!) 146/86, pulse 78, temperature 98.2 F (36.8 C), temperature source Oral, resp. rate 17, height 5' 4 (1.626 m), weight 89.4 kg, last menstrual period 04/27/2023, SpO2 97%, unknown if currently breastfeeding.  Physical Exam:  General: alert, cooperative, and no distress Lochia: appropriate Uterine Fundus: firm DVT Evaluation: No evidence of DVT seen on physical exam.  Recent Labs    01/09/24 0432 01/10/24 0525  HGB 7.8* 8.6*  HCT 23.3* 25.2*    Assessment/Plan: Postpartum Recovering appropriately, however multiple medical comorbidities present (see below) - Contraception: s/p BTL. Remove pressure dressing this evening (after 5p). Has small superficial burn at incision from Ligasure than can be covered with petroleum jelly prn - MOF: formula - Rh status: Rh+ - Rubella status: RI - Dispo: potential discharge home today pending BP control  2. 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 BP now mild range - Continue labetalol  800mg  TID & procardia  XL 60mg  BID - Cr stable at 1.15 this AM; UOP appropriate - Continue lasix  x 5-10d PP - Will watch blood pressures today; if they continue to improve with PO medications, plan for discharge home this evening   3. Epilepsy - Continue home trileptal   4. PPH, clinically significant acute blood loss anemia due to unexpected blood loss - Hgb stable, 8.6 this AM - Currently asymptomatic - PO iron  ordered  5. Neonatal - Doing well at bedside - Circumcision done yesterday  Dispo: continue inpatient admission, potential PM  discharge vs discharge PPD4   LOS: 4 days   Kieth JAYSON Carolin 01/10/2024, 9:41 AM

## 2024-01-10 NOTE — Plan of Care (Signed)
 Pt to be discharged home with printed instructions. [name], RN

## 2024-01-10 NOTE — Plan of Care (Signed)
Patient to be discharged home with printed instructions. Toya Smothers, RN

## 2024-01-10 NOTE — Plan of Care (Signed)
 Problem: Education: Goal: Knowledge of General Education information will improve Description: Including pain rating scale, medication(s)/side effects and non-pharmacologic comfort measures Outcome: Progressing   Problem: Health Behavior/Discharge Planning: Goal: Ability to manage health-related needs will improve Outcome: Progressing   Problem: Clinical Measurements: Goal: Ability to maintain clinical measurements within normal limits will improve Outcome: Progressing Goal: Will remain free from infection Outcome: Progressing Goal: Diagnostic test results will improve Outcome: Progressing Goal: Respiratory complications will improve Outcome: Progressing Goal: Cardiovascular complication will be avoided Outcome: Progressing   Problem: Activity: Goal: Risk for activity intolerance will decrease Outcome: Progressing   Problem: Nutrition: Goal: Adequate nutrition will be maintained Outcome: Progressing   Problem: Coping: Goal: Level of anxiety will decrease Outcome: Progressing   Problem: Elimination: Goal: Will not experience complications related to bowel motility Outcome: Progressing Goal: Will not experience complications related to urinary retention Outcome: Progressing   Problem: Pain Managment: Goal: General experience of comfort will improve and/or be controlled Outcome: Progressing   Problem: Safety: Goal: Ability to remain free from injury will improve Outcome: Progressing   Problem: Skin Integrity: Goal: Risk for impaired skin integrity will decrease Outcome: Progressing   Problem: Education: Goal: Ability to describe self-care measures that may prevent or decrease complications (Diabetes Survival Skills Education) will improve Outcome: Progressing Goal: Individualized Educational Video(s) Outcome: Progressing   Problem: Coping: Goal: Ability to adjust to condition or change in health will improve Outcome: Progressing   Problem: Fluid  Volume: Goal: Ability to maintain a balanced intake and output will improve Outcome: Progressing   Problem: Health Behavior/Discharge Planning: Goal: Ability to identify and utilize available resources and services will improve Outcome: Progressing Goal: Ability to manage health-related needs will improve Outcome: Progressing   Problem: Metabolic: Goal: Ability to maintain appropriate glucose levels will improve Outcome: Progressing   Problem: Nutritional: Goal: Maintenance of adequate nutrition will improve Outcome: Progressing Goal: Progress toward achieving an optimal weight will improve Outcome: Progressing   Problem: Skin Integrity: Goal: Risk for impaired skin integrity will decrease Outcome: Progressing   Problem: Tissue Perfusion: Goal: Adequacy of tissue perfusion will improve Outcome: Progressing   Problem: Education: Goal: Knowledge of disease or condition will improve Outcome: Progressing Goal: Knowledge of the prescribed therapeutic regimen will improve Outcome: Progressing   Problem: Fluid Volume: Goal: Peripheral tissue perfusion will improve Outcome: Progressing   Problem: Clinical Measurements: Goal: Complications related to disease process, condition or treatment will be avoided or minimized Outcome: Progressing   Problem: Education: Goal: Knowledge of Childbirth will improve Outcome: Progressing Goal: Ability to make informed decisions regarding treatment and plan of care will improve Outcome: Progressing Goal: Ability to state and carry out methods to decrease the pain will improve Outcome: Progressing Goal: Individualized Educational Video(s) Outcome: Progressing   Problem: Coping: Goal: Ability to verbalize concerns and feelings about labor and delivery will improve Outcome: Progressing   Problem: Life Cycle: Goal: Ability to make normal progression through stages of labor will improve Outcome: Progressing Goal: Ability to effectively  push during vaginal delivery will improve Outcome: Progressing   Problem: Role Relationship: Goal: Will demonstrate positive interactions with the child Outcome: Progressing   Problem: Safety: Goal: Risk of complications during labor and delivery will decrease Outcome: Progressing   Problem: Pain Management: Goal: Relief or control of pain from uterine contractions will improve Outcome: Progressing   Problem: Education: Goal: Knowledge of condition will improve Outcome: Progressing Goal: Individualized Educational Video(s) Outcome: Progressing Goal: Individualized Newborn Educational Video(s) Outcome: Progressing   Problem:  Activity: Goal: Will verbalize the importance of balancing activity with adequate rest periods Outcome: Progressing Goal: Ability to tolerate increased activity will improve Outcome: Progressing   Problem: Coping: Goal: Ability to identify and utilize available resources and services will improve Outcome: Progressing

## 2024-01-10 NOTE — Discharge Instructions (Signed)
 Post-surgical Instructions  You may expect to feel dizzy, weak, and drowsy for as long as 24 hours after receiving the medicine that made you sleep (anesthetic). For the first 24 hours after your surgery:   Do not drive a car, ride a bicycle, participate in physical activities, or take public transportation until you are done taking narcotic pain medicines Do not drink alcohol or take tranquilizers.  Do not take medicine that has not been prescribed by your physicians.  Do not sign important papers or make important decisions while on narcotic pain medicines.  Have a responsible person with you.   CARE OF INCISION If you have a bandage, you may remove it in one day.  If there are steri-strips or dermabond, just let this loosen on its own.  You may shower on the first day after your surgery. Avoid heavy lifting (more than 10 pounds/4.5 kilograms), pushing, or pulling.  Avoid activities that may risk injury to your incisions.   PAIN MANAGEMENT Acetaminophen  1000mg  (This is the same as 2-500mg  over the counter extra strength tylenol ). Take this every 6 hours for the first 3 days or as needed afterwards for pain Oxycodone  5mg  For more severe pain, take one or two tablets every four to six hours as needed for pain control.  (Remember that narcotic pain medications increase your risk of constipation.  If this becomes a problem, you may take an over the counter laxative like miralax.)  DO'S AND DON'T'S Do not take a tub bath for 4 weeks.  You may shower on the first day after your surgery Do not do any heavy lifting for four weeks.  This increases the chance of bleeding. Do move around as you feel able.  Stairs are fine.  You may begin to exercise again as you feel able.  Do not put anything in the vagina for 4-6 weeks--no tampons, intercourse, or douching.    REGULAR MEDIATIONS/VITAMINS: You may restart all of your regular medications as prescribed. You may restart all of your vitamins as you  normally take them.    PLEASE CALL OR SEEK MEDICAL CARE IF: You have persistent nausea and vomiting.  You have trouble eating or drinking.  You have an oral temperature above 100.5.  You have constipation that is not helped by adjusting diet or increasing fluid intake. Pain medicines are a common cause of constipation.  You have heavy vaginal bleeding You have redness or drainage from your incision(s) or there is increasing pain or tenderness near or in the surgical site.

## 2024-01-12 ENCOUNTER — Inpatient Hospital Stay (HOSPITAL_COMMUNITY)

## 2024-01-13 ENCOUNTER — Inpatient Hospital Stay (HOSPITAL_COMMUNITY)

## 2024-01-13 ENCOUNTER — Inpatient Hospital Stay (HOSPITAL_COMMUNITY): Admission: RE | Admit: 2024-01-13 | Source: Home / Self Care | Admitting: Obstetrics & Gynecology

## 2024-01-14 ENCOUNTER — Telehealth: Payer: Self-pay

## 2024-01-14 ENCOUNTER — Ambulatory Visit

## 2024-01-14 ENCOUNTER — Ambulatory Visit: Payer: Self-pay | Admitting: Obstetrics and Gynecology

## 2024-01-14 VITALS — BP 139/83 | HR 88 | Ht 64.0 in | Wt 178.0 lb

## 2024-01-14 DIAGNOSIS — D509 Iron deficiency anemia, unspecified: Secondary | ICD-10-CM

## 2024-01-14 DIAGNOSIS — I10 Essential (primary) hypertension: Secondary | ICD-10-CM

## 2024-01-14 DIAGNOSIS — N051 Unspecified nephritic syndrome with focal and segmental glomerular lesions: Secondary | ICD-10-CM

## 2024-01-14 LAB — SURGICAL PATHOLOGY

## 2024-01-14 NOTE — Progress Notes (Signed)
 Subjective:  Selena Meyer is a 39 y.o. female here for BP check and incision check. Pt is 5 days postpartum following a SVD on 01/07/24 and Bilateral Partial Salpingectomy on 01/09/24. Pt reported mild vaginal bleeding. Pt denied any headaches or blurred vision.  Hypertension ROS: taking medications as instructed, no medication side effects noted, no TIA's, no chest pain on exertion, no dyspnea on exertion, and no swelling of ankles.   Pt reports incision healing well  Objective: BP 139/83, Pulse 88, Weight 178 lbs LMP 04/27/2023 (Exact Date)   Appearance alert, well appearing, and in no distress. Incision healing well.   Assessment:   Blood Pressure today in office well controlled, stable, and improved.  Incision healed nicely, no signs of infection, scar looks good  Plan:  Current treatment plan is effective, no change in therapy.. Reviewed incision site care and when to notify provider. Reviewed to continue to monitor blood pressure at home.   [name], RN

## 2024-01-14 NOTE — Addendum Note (Signed)
 Addended by: ORLINDA SILVANO ORN on: 01/14/2024 04:32 PM   Modules accepted: Orders

## 2024-01-14 NOTE — Telephone Encounter (Signed)
 Called to complete [facility] call after discharge. Called once. No answer. Left VM to return call.   Lolamae Voisin BS, IBCLC

## 2024-01-14 NOTE — Addendum Note (Signed)
 Addended by: ERIK MODER on: 01/14/2024 04:24 PM   Modules accepted: Orders

## 2024-01-14 NOTE — Addendum Note (Signed)
 Addended by: ORLINDA SILVANO ORN on: 01/14/2024 04:30 PM   Modules accepted: Orders

## 2024-01-16 ENCOUNTER — Telehealth: Payer: Self-pay

## 2024-01-16 NOTE — Telephone Encounter (Signed)
 Called to complete [facility] call after discharge. Called once. No answer. Left VM to return call.    Daylin Eads BS, IBCLC

## 2024-01-18 ENCOUNTER — Encounter: Payer: Self-pay | Admitting: Obstetrics and Gynecology

## 2024-01-18 ENCOUNTER — Encounter: Payer: Self-pay | Admitting: Family

## 2024-01-22 ENCOUNTER — Encounter: Payer: Self-pay | Admitting: Family

## 2024-01-22 ENCOUNTER — Encounter: Payer: Self-pay | Admitting: Obstetrics and Gynecology

## 2024-01-23 ENCOUNTER — Telehealth: Payer: Self-pay

## 2024-01-23 NOTE — Telephone Encounter (Signed)
 Called to complete [employer] [facility] call. Patient was at an appointment and was unable to complete call and wanted to give me a call back. Gave her the number to call back to complete call.  Kaisyn Reinhold BS, IBCLC

## 2024-01-25 ENCOUNTER — Encounter: Payer: Self-pay | Admitting: Cardiology

## 2024-01-25 ENCOUNTER — Ambulatory Visit: Attending: Cardiology | Admitting: Cardiology

## 2024-01-25 VITALS — BP 164/100 | HR 78 | Ht 64.0 in | Wt 178.4 lb

## 2024-01-25 DIAGNOSIS — Z79899 Other long term (current) drug therapy: Secondary | ICD-10-CM

## 2024-01-25 DIAGNOSIS — I10 Essential (primary) hypertension: Secondary | ICD-10-CM | POA: Diagnosis not present

## 2024-01-25 MED ORDER — ENALAPRIL MALEATE 5 MG PO TABS: 5.0000 mg | ORAL_TABLET | Freq: Every day | ORAL | 0 refills | Status: AC

## 2024-01-25 NOTE — Progress Notes (Signed)
 "  Cardio-Obstetrics Clinic  Follow Up Note   Date:  01/25/2024   ID:  Selena Meyer, DOB [birth year], MRN [id number]  PCP:  Freddrick Johns   [facility] Providers Cardiologist:  Dub Huntsman, DO  Electrophysiologist:  None        Referring MD: No ref. provider found   Chief Complaint:  I am doing well  History of Present Illness:    Selena Meyer is a 39 y.o. female [G2P0202] who returns for follow up for postpartum cardiovascular care.    Medical includes SVT, Hypertension, hypertriglyceridemia, FSGS, gestational diabetes and seizures.   She is postpartum.  Overall doing well she is here with the baby.   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    Gestational diabetes    Hypertension    Hypokalemia 11/02/2020   Irregular menstrual cycle    Irregular periods/menstrual cycles 05/03/2017   Polycystic ovaries 11/09/2015   Proteinuria 02/28/2013   Seizures (HCC)    12.24 takes meds   Stage 2 chronic kidney disease 04/27/2020   SVT (supraventricular tachycardia) 11/01/2020   Systolic murmur 07/16/2017   Tobacco abuse 02/28/2013   Uncontrolled hypertension 05/10/2017    Past Surgical History:  Procedure Laterality Date   NO PAST SURGERIES     TUBAL LIGATION N/A 01/09/2024   Procedure: LIGATION, FALLOPIAN TUBE, POSTPARTUM;  Surgeon: Erik Kieth BROCKS, MD;  Location: [facility] LD ORS;  Service: Gynecology;  Laterality: N/A;      OB History     Gravida  2   Para  2   Term      Preterm  2   AB      Living  2      SAB      IAB      Ectopic      Multiple  0   Live  Births  2               Current Medications: Current Meds  Medication Sig   acetaminophen  (TYLENOL ) 500 MG tablet Take 2 tablets (1,000 mg total) by mouth every 6 (six) hours as needed.   albuterol  (VENTOLIN  HFA) 108 (90 Base) MCG/ACT inhaler Inhale 2 puffs into the lungs every 6 (six) hours as needed for wheezing or shortness of breath.   enalapril  (VASOTEC ) 5 MG tablet Take 1 tablet (5 mg total) by mouth daily.   ferrous sulfate  325 (65 FE) MG tablet Take 1 tablet (325 mg total) by mouth every other day.   furosemide  (LASIX ) 20 MG tablet Take 1 tablet (20 mg total) by mouth daily.   labetalol  (NORMODYNE ) 200 MG tablet Take 4 tablets (800 mg total) by mouth 3 (three) times daily.   Midazolam  (NAYZILAM ) 5 MG/0.1ML SOLN Place 5 mg into the nose as needed (For seizure cluster or seizure lasting more than 5 minutes).   NIFEdipine  (ADALAT  CC) 60 MG 24 hr tablet Take 1 tablet (60 mg total) by mouth 2 (two) times daily.   oxcarbazepine  (TRILEPTAL ) 600 MG tablet Take 1.5 tablets (900 mg total) by mouth 2 (two) times daily.   oxyCODONE  (OXY IR/ROXICODONE ) 5 MG immediate release tablet  Take 1 tablet (5 mg total) by mouth every 4 (four) hours as needed for breakthrough pain.   Prenatal Vit-Fe Fumarate-FA (MULTIVITAMIN-PRENATAL) 27-0.8 MG TABS tablet Take 1 tablet by mouth daily at 12 noon.     Allergies:   Ibuprofen   Social History   Socioeconomic History   Marital status: Married    Spouse name: Marsha   Number of children: 0   Years of education: Not on file   Highest education level: Not on file  Occupational History    Employer: UNEMPLOYED  Tobacco Use   Smoking status: Former    Current packs/day: 0.00    Average packs/day: 0.3 packs/day for 5.0 years (1.3 ttl pk-yrs)    Types: Cigarettes    Start date: 05/16/2016    Quit date: 05/16/2021    Years since quitting: 2.6   Smokeless tobacco: Never   Tobacco comments:    Quit smoking 06/17/23after her mom passed away  Vaping Use    Vaping status: Never Used  Substance and Sexual Activity   Alcohol use: Not Currently    Comment: occasional use; social drinker   Drug use: No   Sexual activity: Yes    Partners: Male    Birth control/protection: None    Comment: Pregnant  Other Topics Concern   Not on file  Social History Narrative   Not on file   Social Drivers of Health   Tobacco Use: Medium Risk (01/25/2024)   Patient History    Smoking Tobacco Use: Former    Smokeless Tobacco Use: Never    Passive Exposure: Not on file  Financial Resource Strain: Low Risk (05/08/2022)   Received from [facility]   Overall Financial Resource Strain (CARDIA)    Difficulty of Paying Living Expenses: Not very hard  Food Insecurity: No Food Insecurity (01/06/2024)   Epic    Worried About Radiation Protection Practitioner of Food in the Last Year: Never true    Ran Out of Food in the Last Year: Never true  Transportation Needs: No Transportation Needs (01/06/2024)   Epic    Lack of Transportation (Medical): No    Lack of Transportation (Non-Medical): No  Physical Activity: Not on file  Stress: Not on file  Social Connections: Not on file  Depression (PHQ2-9): Low Risk (11/14/2023)   Depression (PHQ2-9)    PHQ-2 Score: 4  Alcohol Screen: Not on file  Housing: Low Risk (01/07/2024)   Epic    Unable to Pay for Housing in the Last Year: No    Number of Times Moved in the Last Year: 0    Homeless in the Last Year: No  Utilities: Not At Risk (01/06/2024)   Epic    Threatened with loss of utilities: No  Health Literacy: Not on file      Family History  Problem Relation Age of Onset   Diabetes Mother    High blood pressure Mother    Renal Disease Mother    Diabetes Sister    Cancer Maternal Grandmother    Diabetes Maternal Grandmother    High blood pressure Maternal Grandmother    Cancer Maternal Aunt        breast   Other Father       ROS:   Please see the history of present illness.     All other systems reviewed and are  negative.   Labs/EKG Reviewed:    EKG:   EKG is was not ordered today.    Recent Labs: 10/04/2023:  TSH 0.894 01/07/2024: Magnesium  4.4; Pro Brain Natriuretic Peptide 135.0 01/10/2024: ALT 13; BUN 14; Creatinine, Ser 1.15; Hemoglobin 8.6; Platelets 274; Potassium 3.9; Sodium 137   Recent Lipid Panel Lab Results  Component Value Date/Time   CHOL 240 (H) 11/15/2021 12:00 AM   TRIG 492 (H) 11/15/2021 12:00 AM   HDL 40 (L) 11/15/2021 12:00 AM   CHOLHDL 6.0 (H) 11/15/2021 12:00 AM   LDLCALC  11/15/2021 12:00 AM     Comment:     . LDL cholesterol not calculated. Triglyceride levels greater than 400 mg/dL invalidate calculated LDL results. . Reference range: <100 . Desirable range <100 mg/dL for primary prevention;   <70 mg/dL for patients with CHD or diabetic patients  with > or = 2 CHD risk factors. SABRA LDL-C is now calculated using the Martin-Hopkins  calculation, which is a validated novel method providing  better accuracy than the Friedewald equation in the  estimation of LDL-C.  Gladis APPLETHWAITE et al. SANDREA. 7986;689(80): 2061-2068  (http://education.[facility].com/faq/FAQ164)     Physical Exam:    VS:  BP (!) 164/100   Pulse 78   Ht 5' 4 (1.626 m)   Wt 178 lb 6.4 oz (80.9 kg)   SpO2 99%   BMI 30.62 kg/m     Wt Readings from Last 3 Encounters:  01/25/24 178 lb 6.4 oz (80.9 kg)  01/14/24 178 lb (80.7 kg)  01/06/24 197 lb (89.4 kg)     GEN:  Well nourished, well developed in no acute distress HEENT: Normal NECK: No JVD; No carotid bruits LYMPHATICS: No lymphadenopathy CARDIAC: RRR, no murmurs, rubs, gallops RESPIRATORY:  Clear to auscultation without rales, wheezing or rhonchi  ABDOMEN: Soft, non-tender, non-distended MUSCULOSKELETAL:  No edema; No deformity  SKIN: Warm and dry NEUROLOGIC:  Alert and oriented x 3 PSYCHIATRIC:  Normal affect    Risk Assessment/Risk Calculators:                  ASSESSMENT & PLAN:    Chronic hypertension in  pregnancy FSGC Seizure Disorder CKD Hx of gestational diabetes  Blood pressure is elevated despite compliance with her antihypertensive medication.  She is currently on labetalol  800 mg 3 times daily as well as nifedipine  60 mg twice a day.I will start Enalapril  5 mg BID.    Will get blood work today   She needs a close follow-up.  Will see the patient in 1 week.   Patient Instructions  Medication Instructions:  Your physician has recommended you make the following change in your medication:  START: Enalapril  5 mg twice daily  *If you need a refill on your cardiac medications before your next appointment, please call your pharmacy*  Lab Work: CMET, Mag If you have labs (blood work) drawn today and your tests are completely normal, you will receive your results only by: MyChart Message (if you have MyChart) OR A paper copy in the mail If you have any lab test that is abnormal or we need to change your treatment, we will call you to review the results.  Follow-Up: At [facility], you and your health needs are our priority.  As part of our continuing mission to provide you with exceptional heart care, our providers are all part of one team.  This team includes your primary Cardiologist (physician) and Advanced Practice Providers or APPs (Physician Assistants and Nurse Practitioners) who all work together to provide you with the care you need, when you need it.  Your next appointment:  Provider:   Tessy Pawelski, DO    Other Instructions:            Dispo:  No follow-ups on file.   Medication Adjustments/Labs and Tests Ordered: Current medicines are reviewed at length with the patient today.  Concerns regarding medicines are outlined above.  Tests Ordered: Orders Placed This Encounter  Procedures   Comp Met (CMET)   Magnesium    Medication Changes: Meds ordered this encounter  Medications   enalapril  (VASOTEC ) 5 MG tablet    Sig: Take 1 tablet (5 mg  total) by mouth daily.    Dispense:  60 tablet    Refill:  0   "

## 2024-01-25 NOTE — Patient Instructions (Signed)
 Medication Instructions:  Your physician has recommended you make the following change in your medication:  START: Enalapril  5 mg twice daily  *If you need a refill on your cardiac medications before your next appointment, please call your pharmacy*  Lab Work: CMET, Mag If you have labs (blood work) drawn today and your tests are completely normal, you will receive your results only by: MyChart Message (if you have MyChart) OR A paper copy in the mail If you have any lab test that is abnormal or we need to change your treatment, we will call you to review the results.  Follow-Up: At [facility], you and your health needs are our priority.  As part of our continuing mission to provide you with exceptional heart care, our providers are all part of one team.  This team includes your primary Cardiologist (physician) and Advanced Practice Providers or APPs (Physician Assistants and Nurse Practitioners) who all work together to provide you with the care you need, when you need it.  Your next appointment:      Provider:   Kardie Tobb, DO    Other Instructions:

## 2024-01-29 ENCOUNTER — Ambulatory Visit: Admitting: Cardiology

## 2024-02-01 ENCOUNTER — Ambulatory Visit: Admitting: Pharmacist

## 2024-02-01 ENCOUNTER — Encounter: Payer: Self-pay | Admitting: Pharmacist

## 2024-02-01 VITALS — BP 130/86 | HR 79 | Ht 64.0 in | Wt 180.9 lb

## 2024-02-01 DIAGNOSIS — I10 Essential (primary) hypertension: Secondary | ICD-10-CM | POA: Diagnosis not present

## 2024-02-01 NOTE — Patient Instructions (Addendum)
 Selena Meyer

## 2024-02-01 NOTE — Progress Notes (Signed)
 Patient ID: Selena Meyer                 DOB: 05/04/1985                      MRN: [id number]     HPI: Selena Meyer is a 39 y.o. female referred yo Cardio OB clinic.  PMH is significant for HTN in pregnancy, FSGS, GDM, and epilepsy.  Patient gave birth on 01/07/24.   Presented to cardio OB clinic on 1/16 for postpartum HTN f/u. Was hypertensive in room and enalapril  5mg  once daily was started.  Patient was only able to pick up her medication this last Sunday. Has had 5 doses.  Remains on nifedipine  and labetalol .  Presents today in good spirits.  Has been infrequently checking BP at home but reports readings in 130s/70s since starting enalapril . Did not bring cuff or log.  Denies CP, SOB, or swelling. Still reports a lack of appetite. Is worried she will be nauseas however she has not vomited since giving birth.  Patient is breastfeeding.  Current HTN meds:  Enalapril  5mg  daily Labetalol  800mg  TID Nifedipine  60mg  BID  Wt Readings from Last 3 Encounters:  02/01/24 180 lb 14.4 oz (82.1 kg)  01/25/24 178 lb 6.4 oz (80.9 kg)  01/14/24 178 lb (80.7 kg)   BP Readings from Last 3 Encounters:  02/01/24 130/86  01/25/24 (!) 164/100  01/14/24 139/83   Pulse Readings from Last 3 Encounters:  02/01/24 79  01/25/24 78  01/14/24 88    Renal function: CrCl cannot be calculated (Patient's most recent lab result is older than the maximum 21 days allowed.).  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    Gestational diabetes    Hypertension    Hypokalemia 11/02/2020   Irregular menstrual cycle    Irregular periods/menstrual cycles 05/03/2017   Polycystic ovaries 11/09/2015    Proteinuria 02/28/2013   Seizures (HCC)    12.24 takes meds   Stage 2 chronic kidney disease 04/27/2020   SVT (supraventricular tachycardia) 11/01/2020   Systolic murmur 07/16/2017   Tobacco abuse 02/28/2013   Uncontrolled hypertension 05/10/2017    Medications Ordered Prior to Encounter[1]  Allergies[2]   Assessment/Plan:  1. Hypertension -  Patient BP elevated upon initial reading but reduced to 130/86 taken by me on recheck.  Diastolic BP above goal. Patient hesitant to increase medications at this time since enalapril  was started this week. Recommend she continue to monitor at home and will f/u in 1 month.  Continue: Enalapril  5mg  daily Labetalol  800mg  TID Nifedipine  60mg  BID F/U in 4 weeks   Medford Bolk, PharmD, BCACP, CDCES, CPP [facility] [address], [place], [place] [address] Phone: [phone]; Fax: [phone] 02/01/2024 4:01 PM      [1]  Current Outpatient Medications on File Prior to Visit  Medication Sig Dispense Refill   acetaminophen  (TYLENOL ) 500 MG tablet Take 2 tablets (1,000 mg total) by mouth every 6 (six) hours as needed. 30 tablet 0   albuterol  (VENTOLIN  HFA) 108 (90 Base) MCG/ACT inhaler Inhale 2 puffs into the lungs every 6 (six) hours as needed for wheezing or shortness of breath. 1 each 1   enalapril  (  VASOTEC ) 5 MG tablet Take 1 tablet (5 mg total) by mouth daily. 60 tablet 0   ferrous sulfate  325 (65 FE) MG tablet Take 1 tablet (325 mg total) by mouth every other day. 100 tablet 3   furosemide  (LASIX ) 20 MG tablet Take 1 tablet (20 mg total) by mouth daily. 5 tablet 0   labetalol  (NORMODYNE ) 200 MG tablet Take 4 tablets (800 mg total) by mouth 3 (three) times daily. 360 tablet 0   metoCLOPramide  (REGLAN ) 10 MG tablet Take 1 tablet (10 mg total) by mouth 4 (four) times daily as needed for nausea or vomiting. 30 tablet 2   Midazolam  (NAYZILAM ) 5 MG/0.1ML SOLN Place 5 mg into the nose as needed (For seizure cluster or seizure  lasting more than 5 minutes). 2 each 0   NIFEdipine  (ADALAT  CC) 60 MG 24 hr tablet Take 1 tablet (60 mg total) by mouth 2 (two) times daily. 60 tablet 0   oxcarbazepine  (TRILEPTAL ) 600 MG tablet Take 1.5 tablets (900 mg total) by mouth 2 (two) times daily. 90 tablet 2   Prenatal Vit-Fe Fumarate-FA (MULTIVITAMIN-PRENATAL) 27-0.8 MG TABS tablet Take 1 tablet by mouth daily at 12 noon.     ondansetron  (ZOFRAN -ODT) 8 MG disintegrating tablet Take 0.5 tablets (4 mg total) by mouth every 8 (eight) hours as needed for nausea. (Patient not taking: Reported on 02/01/2024) 30 tablet 2   oxyCODONE  (OXY IR/ROXICODONE ) 5 MG immediate release tablet Take 1 tablet (5 mg total) by mouth every 4 (four) hours as needed for breakthrough pain. (Patient not taking: Reported on 02/01/2024) 8 tablet 0   potassium chloride  SA (KLOR-CON  M) 20 MEQ tablet Take 1 tablet (20 mEq total) by mouth daily. (Patient not taking: Reported on 02/01/2024) 10 tablet 0   senna-docusate (SENOKOT-S) 8.6-50 MG tablet Take 2 tablets by mouth at bedtime as needed for mild constipation. (Patient not taking: Reported on 02/01/2024) 20 tablet 0   No current facility-administered medications on file prior to visit.  [2]  Allergies Allergen Reactions   Ibuprofen Hives and Rash

## 2024-02-08 ENCOUNTER — Telehealth: Payer: Self-pay

## 2024-02-08 NOTE — Telephone Encounter (Signed)
 Called to complete [employer] call. Called once. No answer. Left VM to return call.    Wenda Vanschaick BS, IBCLC

## 2024-02-10 ENCOUNTER — Encounter: Payer: Self-pay | Admitting: Obstetrics and Gynecology

## 2024-02-10 ENCOUNTER — Encounter: Payer: Self-pay | Admitting: Family

## 2024-02-12 ENCOUNTER — Other Ambulatory Visit: Payer: Self-pay | Admitting: Neurology

## 2024-02-13 ENCOUNTER — Inpatient Hospital Stay

## 2024-02-13 ENCOUNTER — Inpatient Hospital Stay: Admitting: Medical Oncology

## 2024-02-19 ENCOUNTER — Inpatient Hospital Stay: Admitting: Medical Oncology

## 2024-02-19 ENCOUNTER — Inpatient Hospital Stay

## 2024-02-25 ENCOUNTER — Ambulatory Visit: Admitting: Obstetrics and Gynecology

## 2024-03-14 ENCOUNTER — Ambulatory Visit: Admitting: Cardiology

## 2024-04-12 IMAGING — US US ABDOMEN COMPLETE
1 series · 14 of 25 positions shown · non-contrast
Comparison: July 10, 2007.

CLINICAL DATA: Acute generalized abdominal pain.

EXAM:
ABDOMEN ULTRASOUND COMPLETE

[Series 1: us abdomen complete · 14 of 77 slices shown]
[im 1/77]
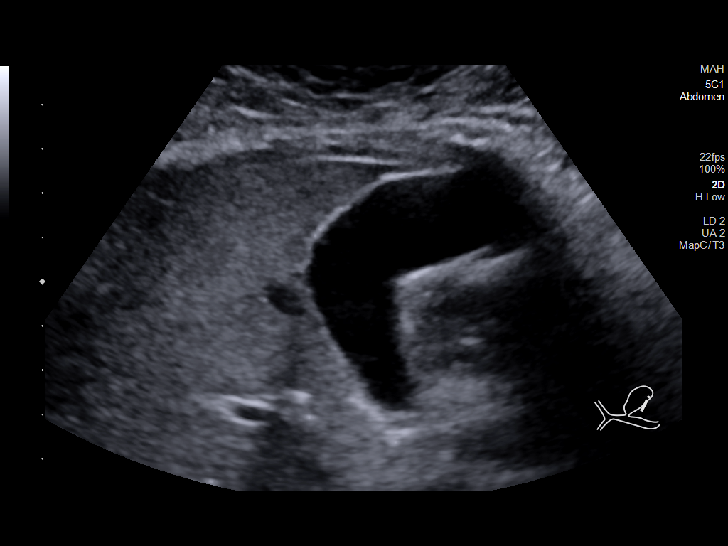
[im 7/77]
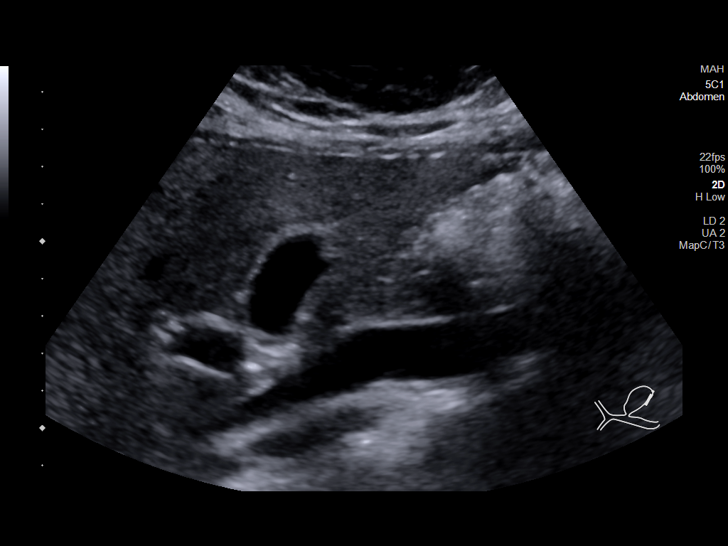
[im 13/77]
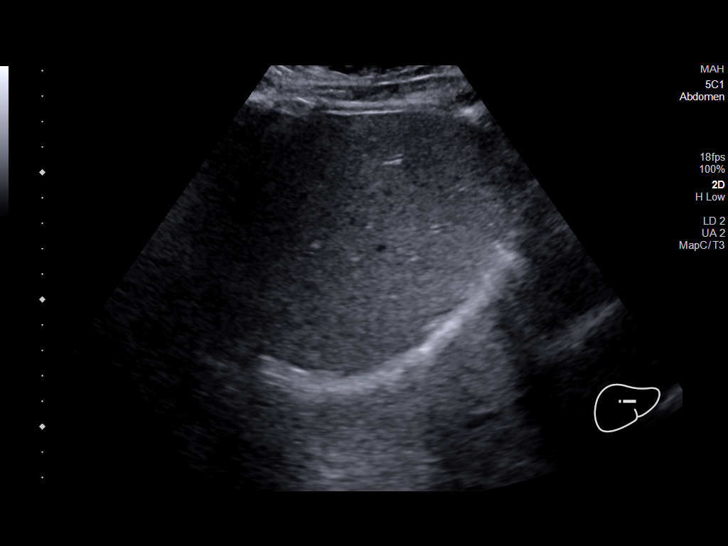
[im 20/77]
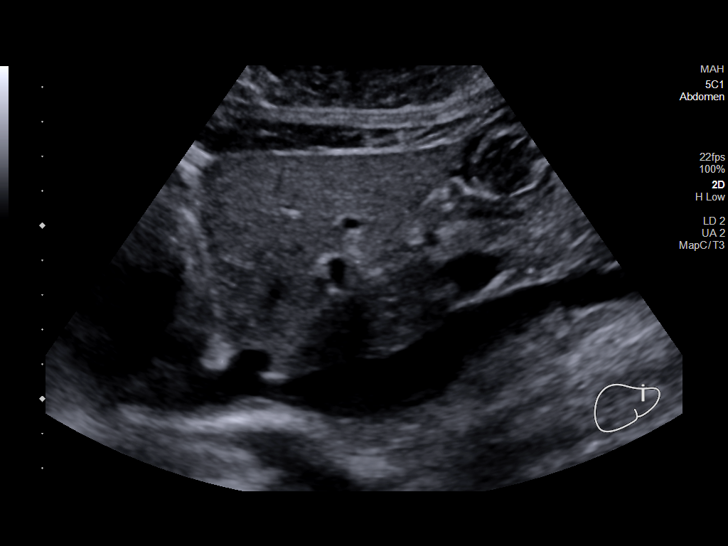
[im 26/77]
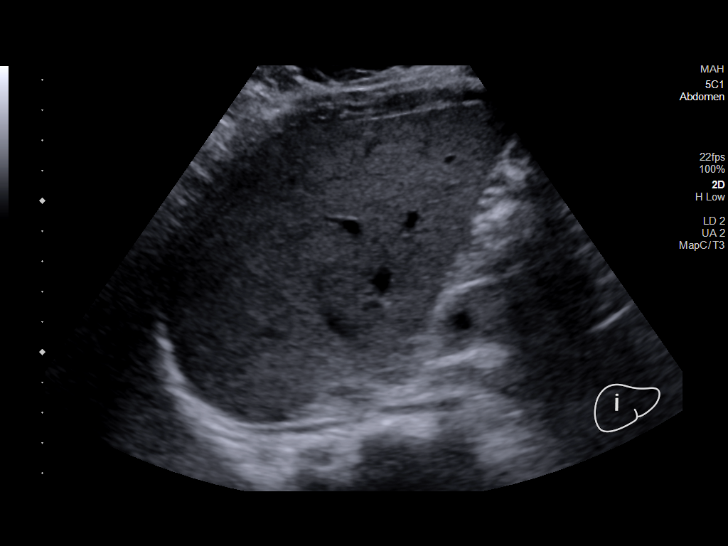
[im 29/77]
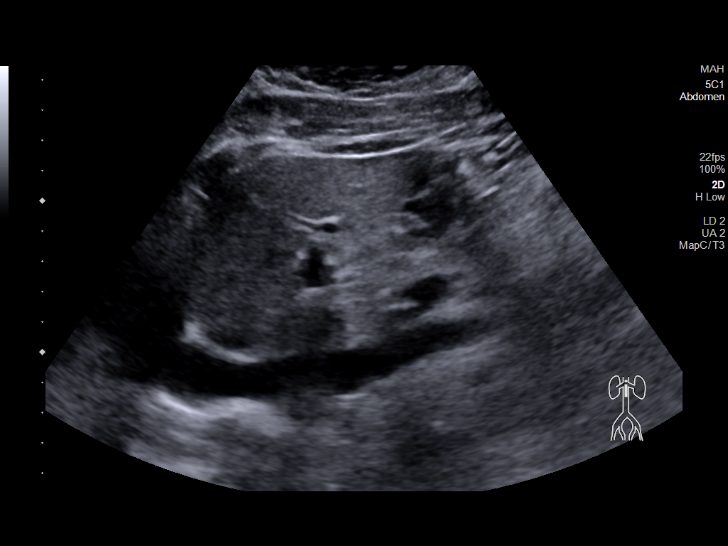
[im 35/77]
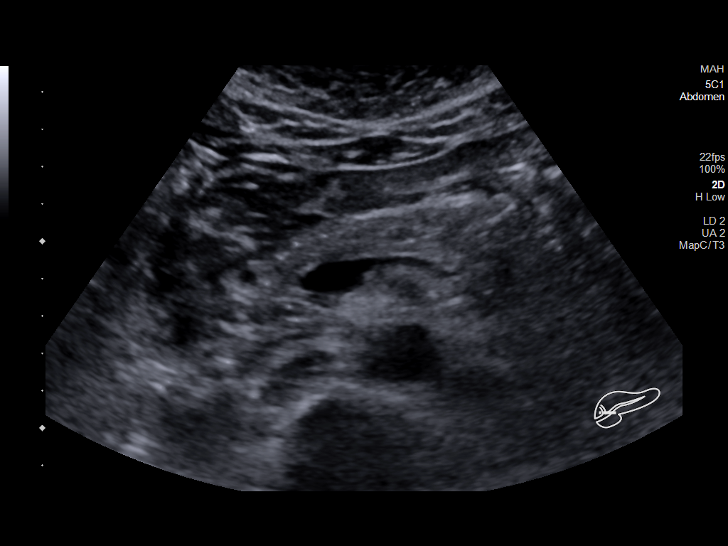
[im 42/77]
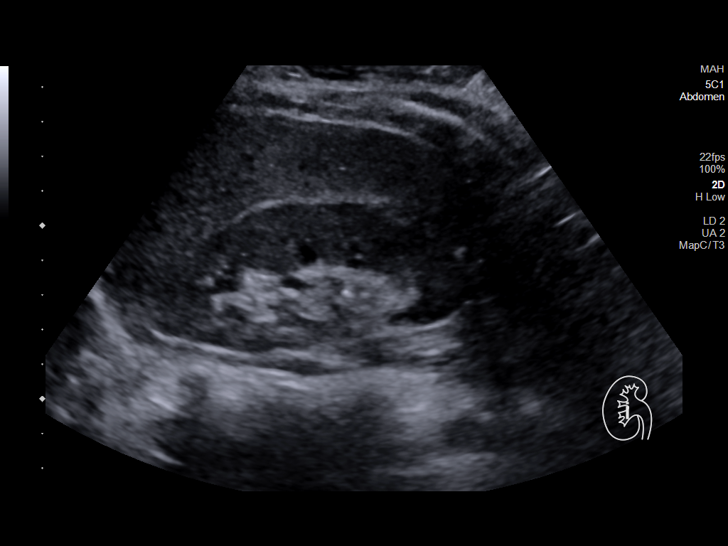
[im 48/77]
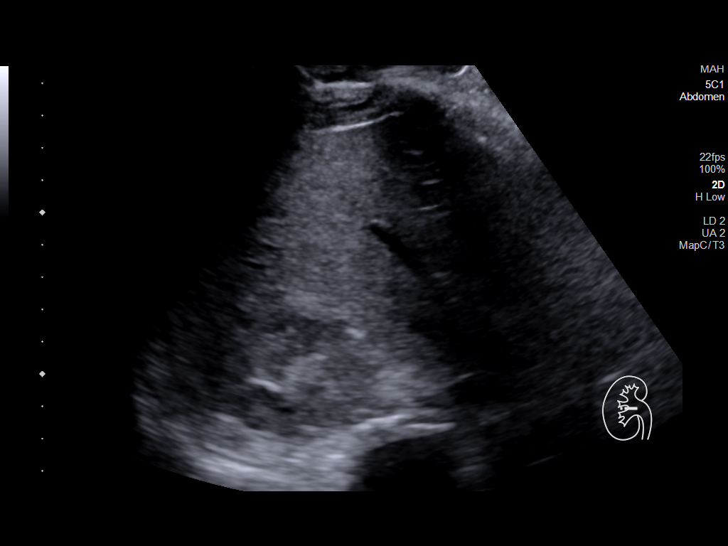
[im 51/77]
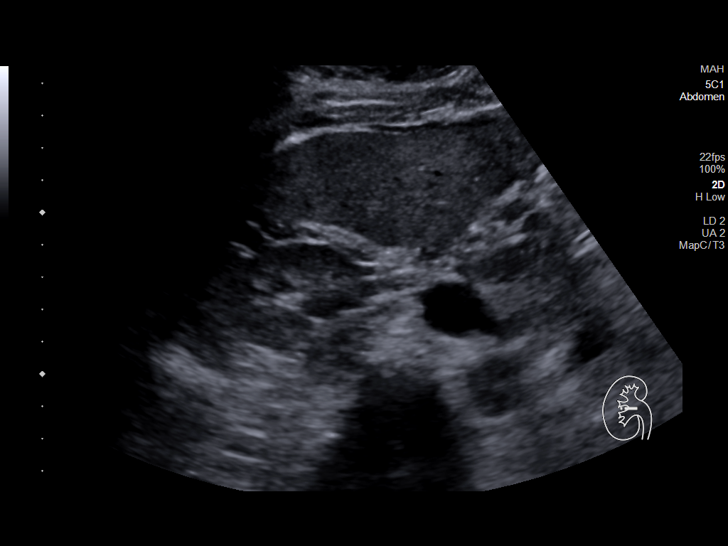
[im 58/77]
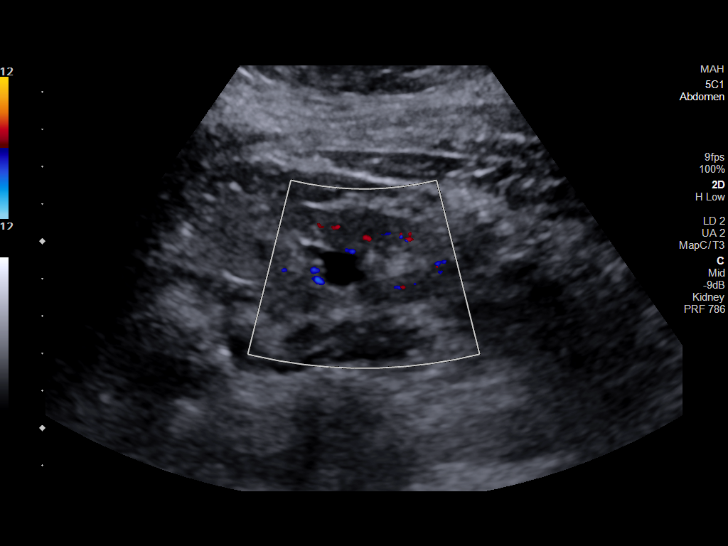
[im 64/77]
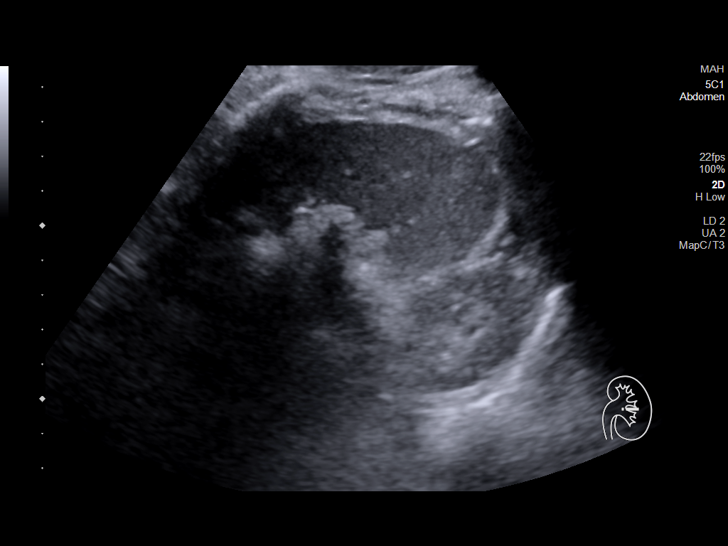
[im 70/77]
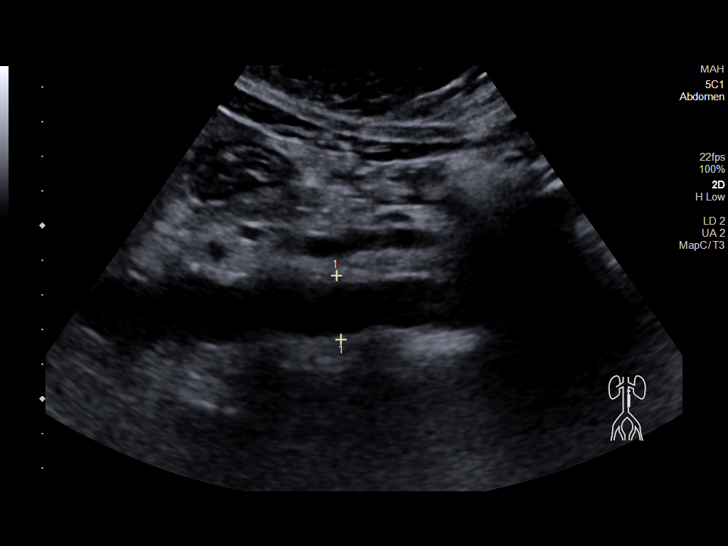
[im 77/77]
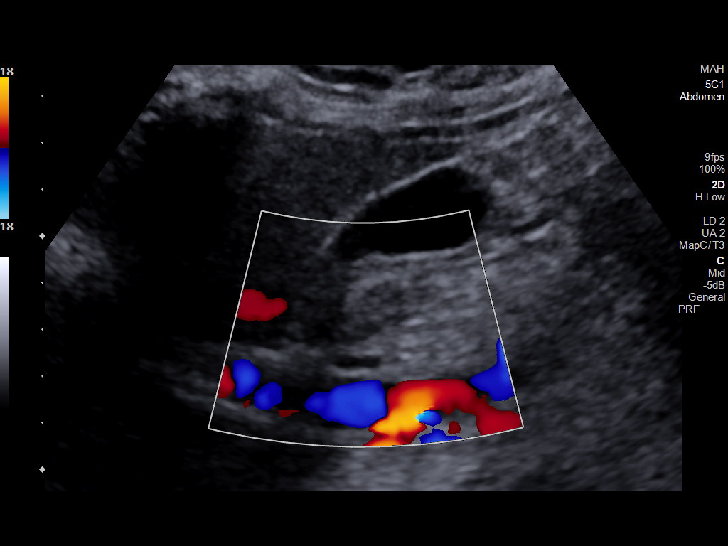

[14 of 25 positions shown; findings below may reference images not displayed]

FINDINGS: Gallbladder: No gallstones or wall thickening visualized. No
sonographic Murphy sign noted by sonographer.

Common bile duct: Diameter: 4 mm which is within normal limits.

Liver: No focal lesion identified. Within normal limits in
parenchymal echogenicity. Portal vein is patent on color Doppler
imaging with normal direction of blood flow towards the liver.

IVC: No abnormality visualized.

Pancreas: Visualized portion unremarkable.

Spleen: Size and appearance within normal limits.

Right Kidney: Length: 9.3 cm. Echogenicity within normal limits. No
mass or hydronephrosis visualized.

Left Kidney: Length: 10.3 cm. Echogenicity within normal limits. No
mass or hydronephrosis visualized.

Abdominal aorta: No aneurysm visualized.

Other findings: None.
IMPRESSION: No significant abnormality seen in the abdomen.

## 2024-05-08 ENCOUNTER — Ambulatory Visit: Admitting: Neurology
